# Patient Record
Sex: Male | Born: 1960 | Race: White | Hispanic: No | Marital: Single | Smoking: Current every day smoker
Health system: Southern US, Community
[De-identification: ages and names within clinical notes are randomized; demographics above are authoritative.]

## PROBLEM LIST (undated history)

## (undated) HISTORY — PX: MANDIBLE SURGERY: SHX707

## (undated) HISTORY — PX: HERNIA REPAIR: SHX51

---

## 2004-10-21 ENCOUNTER — Emergency Department: Payer: Self-pay | Admitting: Emergency Medicine

## 2006-07-26 ENCOUNTER — Emergency Department: Payer: Self-pay | Admitting: Emergency Medicine

## 2006-09-07 ENCOUNTER — Ambulatory Visit: Payer: Self-pay | Admitting: Internal Medicine

## 2006-09-07 LAB — CONVERTED CEMR LAB
ALT: 23 units/L (ref 0–53)
AST: 17 units/L (ref 0–37)
Albumin: 4 g/dL (ref 3.5–5.2)
Alkaline Phosphatase: 83 units/L (ref 39–117)
BUN: 12 mg/dL (ref 6–23)
Basophils Absolute: 0.1 10*3/uL (ref 0.0–0.1)
Basophils Relative: 0.5 % (ref 0.0–1.0)
Bilirubin Urine: NEGATIVE
Bilirubin, Direct: 0.1 mg/dL (ref 0.0–0.3)
CO2: 33 meq/L — ABNORMAL HIGH (ref 19–32)
Calcium: 10 mg/dL (ref 8.4–10.5)
Chloride: 103 meq/L (ref 96–112)
Cholesterol: 131 mg/dL (ref 0–200)
Creatinine, Ser: 0.9 mg/dL (ref 0.4–1.5)
Direct LDL: 55.3 mg/dL
Eosinophils Absolute: 0 10*3/uL (ref 0.0–0.6)
Eosinophils Relative: 0.3 % (ref 0.0–5.0)
GFR calc Af Amer: 117 mL/min
GFR calc non Af Amer: 97 mL/min
Glucose, Bld: 108 mg/dL — ABNORMAL HIGH (ref 70–99)
HCT: 41.3 % (ref 39.0–52.0)
HDL: 48.3 mg/dL (ref >39.0)
Hemoglobin, Urine: NEGATIVE
Hemoglobin: 14.7 g/dL (ref 13.0–17.0)
Ketones, ur: NEGATIVE mg/dL
Leukocytes, UA: NEGATIVE
Lymphocytes Relative: 16.9 % (ref 12.0–46.0)
MCHC: 35.6 g/dL (ref 30.0–36.0)
MCV: 92.4 fL (ref 78.0–100.0)
Monocytes Absolute: 0.6 10*3/uL (ref 0.2–0.7)
Monocytes Relative: 5.7 % (ref 3.0–11.0)
Neutro Abs: 8.7 10*3/uL — ABNORMAL HIGH (ref 1.4–7.7)
Neutrophils Relative %: 76.6 % (ref 43.0–77.0)
Nitrite: NEGATIVE
PSA: 0.58 ng/mL (ref 0.10–4.00)
Platelets: 286 10*3/uL (ref 150–400)
Potassium: 4.1 meq/L (ref 3.5–5.1)
RBC: 4.47 M/uL (ref 4.22–5.81)
RDW: 12.5 % (ref 11.5–14.6)
Sodium: 143 meq/L (ref 135–145)
Specific Gravity, Urine: 1.02 (ref 1.000–1.03)
TSH: 0.8 microintl units/mL (ref 0.35–5.50)
Total Bilirubin: 0.9 mg/dL (ref 0.3–1.2)
Total CHOL/HDL Ratio: 2.7
Total Protein: 7.2 g/dL (ref 6.0–8.3)
Triglycerides: 246 mg/dL (ref 0–149)
Urine Glucose: NEGATIVE mg/dL
Urobilinogen, UA: 1 (ref 0.0–1.0)
VLDL: 49 mg/dL — ABNORMAL HIGH (ref 0–40)
WBC: 11.3 10*3/uL — ABNORMAL HIGH (ref 4.5–10.5)
pH: 7 (ref 5.0–8.0)

## 2006-10-22 ENCOUNTER — Emergency Department: Payer: Self-pay | Admitting: Emergency Medicine

## 2006-11-07 ENCOUNTER — Telehealth (INDEPENDENT_AMBULATORY_CARE_PROVIDER_SITE_OTHER): Payer: Self-pay | Admitting: *Deleted

## 2006-11-08 DIAGNOSIS — M545 Low back pain, unspecified: Secondary | ICD-10-CM | POA: Insufficient documentation

## 2006-11-08 DIAGNOSIS — G43009 Migraine without aura, not intractable, without status migrainosus: Secondary | ICD-10-CM | POA: Insufficient documentation

## 2006-11-21 ENCOUNTER — Encounter: Payer: Self-pay | Admitting: Internal Medicine

## 2007-01-03 ENCOUNTER — Encounter: Payer: Self-pay | Admitting: Internal Medicine

## 2007-03-16 ENCOUNTER — Encounter: Payer: Self-pay | Admitting: Internal Medicine

## 2007-04-30 ENCOUNTER — Emergency Department: Payer: Self-pay | Admitting: Internal Medicine

## 2007-06-13 ENCOUNTER — Emergency Department: Payer: Self-pay | Admitting: Internal Medicine

## 2009-07-04 ENCOUNTER — Emergency Department: Payer: Self-pay | Admitting: Emergency Medicine

## 2011-03-19 ENCOUNTER — Emergency Department: Payer: Self-pay | Admitting: Emergency Medicine

## 2011-03-19 LAB — TROPONIN I: Troponin-I: <0.02 ng/mL

## 2011-03-19 LAB — COMPREHENSIVE METABOLIC PANEL
Albumin: 4 g/dL (ref 3.4–5.0)
Anion Gap: 9 (ref 7–16)
BUN: 12 mg/dL (ref 7–18)
Calcium, Total: 9.5 mg/dL (ref 8.5–10.1)
Chloride: 104 mmol/L (ref 98–107)
EGFR (African American): >60
Glucose: 112 mg/dL — ABNORMAL HIGH (ref 65–99)
Potassium: 3.9 mmol/L (ref 3.5–5.1)
SGOT(AST): 20 U/L (ref 15–37)
SGPT (ALT): 24 U/L
Total Protein: 8 g/dL (ref 6.4–8.2)

## 2011-03-19 LAB — CBC
MCV: 96 fL (ref 80–100)
Platelet: 220 10*3/uL (ref 150–440)
RDW: 11.9 % (ref 11.5–14.5)

## 2011-03-19 LAB — CK TOTAL AND CKMB (NOT AT ARMC): CK, Total: 35 U/L (ref 35–232)

## 2012-06-08 ENCOUNTER — Emergency Department: Payer: Self-pay | Admitting: Emergency Medicine

## 2012-09-28 ENCOUNTER — Emergency Department: Payer: Self-pay | Admitting: Internal Medicine

## 2014-04-09 ENCOUNTER — Emergency Department: Payer: Self-pay | Admitting: Internal Medicine

## 2015-06-18 ENCOUNTER — Emergency Department
Admission: EM | Admit: 2015-06-18 | Discharge: 2015-06-18 | Disposition: A | Discharge status: Home / Self Care | Payer: Self-pay | Attending: Emergency Medicine | Admitting: Emergency Medicine

## 2015-06-18 ENCOUNTER — Encounter: Payer: Self-pay | Admitting: Emergency Medicine

## 2015-06-18 ENCOUNTER — Emergency Department: Payer: Self-pay

## 2015-06-18 DIAGNOSIS — J40 Bronchitis, not specified as acute or chronic: Secondary | ICD-10-CM

## 2015-06-18 DIAGNOSIS — F17210 Nicotine dependence, cigarettes, uncomplicated: Secondary | ICD-10-CM | POA: Insufficient documentation

## 2015-06-18 DIAGNOSIS — J209 Acute bronchitis, unspecified: Secondary | ICD-10-CM | POA: Insufficient documentation

## 2015-06-18 MED ORDER — ALBUTEROL SULFATE HFA 108 (90 BASE) MCG/ACT IN AERS: 2.0000 | INHALATION_SPRAY | Freq: Four times a day (QID) | RESPIRATORY_TRACT | Status: DC | PRN

## 2015-06-18 MED ORDER — PSEUDOEPH-BROMPHEN-DM 30-2-10 MG/5ML PO SYRP: 5.0000 mL | ORAL_SOLUTION | Freq: Four times a day (QID) | ORAL | Status: DC | PRN

## 2015-06-18 MED ORDER — IPRATROPIUM-ALBUTEROL 0.5-2.5 (3) MG/3ML IN SOLN
3.0000 mL | Freq: Once | RESPIRATORY_TRACT | Status: AC
  Administered 2015-06-18: 3 mL via RESPIRATORY_TRACT
  Filled 2015-06-18: qty 3

## 2015-06-18 NOTE — Discharge Instructions (Signed)
Take medication as directed.

## 2015-06-18 NOTE — ED Provider Notes (Signed)
[facility] Emergency Department Provider Note   ____________________________________________  Time seen: Approximately 12:40 PM  I have reviewed the triage vital signs and the nursing notes.   HISTORY  Chief Complaint Cough and Nasal Congestion    HPI Mathew Wyatt is a 55 y.o. male patient complaining of cough, congestion, and nasal drainage one week. Patient said over-the-counter medications not improve his complaint. Patient denies any fever or chills associated this complaint. Patient stated this been frontal headache. Patient stated  postnasal drainage. Patient states cough worsened with laying down. He denies any pain with this complaint. Patient smokes 1 pack cigarettes a day. Patient denies any nausea vomiting and diarrhea.   History reviewed. No pertinent past medical history.  Patient Active Problem List   Diagnosis Date Noted  . COMMON MIGRAINE 11/08/2006  . LOW BACK PAIN 11/08/2006    History reviewed. No pertinent past surgical history.  Current Outpatient Rx  Name  Route  Sig  Dispense  Refill  . albuterol (PROVENTIL HFA;VENTOLIN HFA) 108 (90 Base) MCG/ACT inhaler   Inhalation   Inhale 2 puffs into the lungs every 6 (six) hours as needed for wheezing or shortness of breath.   1 Inhaler   2   . brompheniramine-pseudoephedrine-DM 30-2-10 MG/5ML syrup   Oral   Take 5 mLs by mouth 4 (four) times daily as needed.   120 mL   0     Allergies Review of patient's allergies indicates no known allergies.  History reviewed. No pertinent family history.  Social History Social History  Substance Use Topics  . Smoking status: Current Every Day Smoker -- 1.00 packs/day    Types: Cigarettes  . Smokeless tobacco: None  . Alcohol Use: No    Review of Systems Constitutional: No fever/chills Eyes: No visual changes. ENT: No sore throat. Cardiovascular: Denies chest pain. Respiratory: Denies shortness of breath.Nonproductive  cough Gastrointestinal: No abdominal pain.  No nausea, no vomiting.  No diarrhea.  No constipation. Genitourinary: Negative for dysuria. Musculoskeletal: Negative for back pain. Skin: Negative for rash. Neurological: Negative for headaches, focal weakness or numbness.   ____________________________________________   PHYSICAL EXAM:  VITAL SIGNS: ED Triage Vitals  Enc Vitals Group     BP 06/18/15 1221 109/69 mmHg     Pulse Rate 06/18/15 1221 69     Resp 06/18/15 1221 18     Temp 06/18/15 1221 97.8 F (36.6 C)     Temp Source 06/18/15 1221 Oral     SpO2 06/18/15 1221 96 %     Weight 06/18/15 1221 160 lb (72.576 kg)     Height 06/18/15 1221 6' (1.829 m)     Head Cir --      Peak Flow --      Pain Score --      Pain Loc --      Pain Edu? --      Excl. in GC? --     Constitutional: Alert and oriented. Well appearing and in no acute distress. Eyes: Conjunctivae are normal. PERRL. EOMI. Head: Atraumatic. Nose: No congestion/rhinnorhea. Mouth/Throat: Mucous membranes are moist.  Oropharynx non-erythematous. Neck: No stridor.  No cervical spine tenderness to palpation. Hematological/Lymphatic/Immunilogical: No cervical lymphadenopathy. Cardiovascular: Normal rate, regular rhythm. Grossly normal heart sounds.  Good peripheral circulation. Respiratory: Normal respiratory effort.  No retractions. Lungs CTAB. Gastrointestinal: Soft and nontender. No distention. No abdominal bruits. No CVA tenderness. Musculoskeletal: No lower extremity tenderness nor edema.  No joint effusions. Neurologic:  Normal speech and  language. No gross focal neurologic deficits are appreciated. No gait instability. Skin:  Skin is warm, dry and intact. No rash noted. Psychiatric: Mood and affect are normal. Speech and behavior are normal.  ____________________________________________   LABS (all labs ordered are listed, but only abnormal results are displayed)  Labs Reviewed - No data to  display ____________________________________________  EKG   ____________________________________________  RADIOLOGY  Except for hyperinflated lungs no acute findings on chest x-ray. ____________________________________________   PROCEDURES  Procedure(s) performed: None  Critical Care performed: No  ____________________________________________   INITIAL IMPRESSION / ASSESSMENT AND PLAN / ED COURSE  Pertinent labs & imaging results that were available during my care of the patient were reviewed by me and considered in my medical decision making (see chart for details).  Bronchitis. Discussed x-ray finding with patient. Discussed tobacco cessation. Patient given a prescription for Bromfed-DM and albuterol inhaler. [facility]. ____________________________________________   FINAL CLINICAL IMPRESSION(S) / ED DIAGNOSES  Final diagnoses:  Bronchitis      NEW MEDICATIONS STARTED DURING THIS VISIT:  New Prescriptions   ALBUTEROL (PROVENTIL HFA;VENTOLIN HFA) 108 (90 BASE) MCG/ACT INHALER    Inhale 2 puffs into the lungs every 6 (six) hours as needed for wheezing or shortness of breath.   BROMPHENIRAMINE-PSEUDOEPHEDRINE-DM 30-2-10 MG/5ML SYRUP    Take 5 mLs by mouth 4 (four) times daily as needed.     Note:  This document was prepared using Dragon voice recognition software and may include unintentional dictation errors.    Joni Reiningonald K Smith, PA-C 06/18/15 1339  Emily FilbertJonathan E Williams, MD 06/18/15 [phone]

## 2015-06-18 NOTE — ED Notes (Signed)
Pt complains of cough, congestion and nasal drainage for one week. Pt has tried OTC medication with no relief.

## 2016-05-04 ENCOUNTER — Emergency Department: Payer: Self-pay

## 2016-05-04 ENCOUNTER — Encounter: Payer: Self-pay | Admitting: Emergency Medicine

## 2016-05-04 ENCOUNTER — Emergency Department
Admission: EM | Admit: 2016-05-04 | Discharge: 2016-05-04 | Disposition: A | Discharge status: Home / Self Care | Payer: Self-pay | Attending: Student in an Organized Health Care Education/Training Program | Admitting: Student in an Organized Health Care Education/Training Program

## 2016-05-04 DIAGNOSIS — G44209 Tension-type headache, unspecified, not intractable: Secondary | ICD-10-CM | POA: Insufficient documentation

## 2016-05-04 DIAGNOSIS — Z79899 Other long term (current) drug therapy: Secondary | ICD-10-CM | POA: Insufficient documentation

## 2016-05-04 DIAGNOSIS — F17210 Nicotine dependence, cigarettes, uncomplicated: Secondary | ICD-10-CM | POA: Insufficient documentation

## 2016-05-04 MED ORDER — BUTALBITAL-APAP-CAFFEINE 50-325-40 MG PO TABS: 1.0000 | ORAL_TABLET | Freq: Four times a day (QID) | ORAL | 0 refills | Status: DC | PRN

## 2016-05-04 NOTE — ED Provider Notes (Signed)
[facility] Emergency Department Provider Note   ____________________________________________   None    (approximate)  I have reviewed the triage vital signs and the nursing notes.   HISTORY  Chief Complaint Headache    HPI Mathew Wyatt is a 56 y.o. male patient complain of increased right temporal headache for several weeks. Patient also states headache is worse with a.m. awakening. Patient state never wear corrective lenses. Patient described the pain as "throbbing". Patient denies vertigo, weakness or paresthesia. Patient currently rates the headache as a 2/10. No palliative measures for this complaint.   History reviewed. No pertinent past medical history.  Patient Active Problem List   Diagnosis Date Noted  . COMMON MIGRAINE 11/08/2006  . LOW BACK PAIN 11/08/2006    History reviewed. No pertinent surgical history.  Prior to Admission medications   Medication Sig Start Date End Date Taking? Authorizing Provider  albuterol (PROVENTIL HFA;VENTOLIN HFA) 108 (90 Base) MCG/ACT inhaler Inhale 2 puffs into the lungs every 6 (six) hours as needed for wheezing or shortness of breath. 06/18/15   Joni Reining, PA-C  brompheniramine-pseudoephedrine-DM 30-2-10 MG/5ML syrup Take 5 mLs by mouth 4 (four) times daily as needed. 06/18/15   Joni Reining, PA-C  butalbital-acetaminophen-caffeine (FIORICET, ESGIC) [phone] MG tablet Take 1-2 tablets by mouth every 6 (six) hours as needed for headache. 05/04/16 05/04/17  Joni Reining, PA-C    Allergies Patient has no known allergies.  History reviewed. No pertinent family history.  Social History Social History  Substance Use Topics  . Smoking status: Current Every Day Smoker    Packs/day: 1.00    Types: Cigarettes  . Smokeless tobacco: Never Used  . Alcohol use No    Review of Systems Constitutional: No fever/chills Eyes: No visual changes. ENT: No sore throat. Cardiovascular: Denies chest  pain. Respiratory: Denies shortness of breath. Gastrointestinal: No abdominal pain.  No nausea, no vomiting.  No diarrhea.  No constipation. Genitourinary: Negative for dysuria. Musculoskeletal: Chronic back pain Skin: Negative for rash. Neurological: Positive for headaches, but denies focal weakness or numbness.   ____________________________________________   PHYSICAL EXAM:  VITAL SIGNS: ED Triage Vitals [05/04/16 1037]  Enc Vitals Group     BP (!) 150/90     Pulse Rate 72     Resp 16     Temp 98 F (36.7 C)     Temp Source Oral     SpO2 100 %     Weight 160 lb (72.6 kg)     Height      Head Circumference      Peak Flow      Pain Score 2     Pain Loc      Pain Edu?      Excl. in GC?     Constitutional: Alert and oriented. Well appearing and in no acute distress. Eyes: Conjunctivae are normal. PERRL. EOMI. Head: Atraumatic. Nose: No congestion/rhinnorhea. Mouth/Throat: Mucous membranes are moist.  Oropharynx non-erythematous. Neck: No stridor.  No cervical spine tenderness to palpation. Hematological/Lymphatic/Immunilogical: No cervical lymphadenopathy. Cardiovascular: Normal rate, regular rhythm. Grossly normal heart sounds.  Good peripheral circulation. Respiratory: Normal respiratory effort.  No retractions. Lungs CTAB. Gastrointestinal: Soft and nontender. No distention. No abdominal bruits. No CVA tenderness. Musculoskeletal: No lower extremity tenderness nor edema.  No joint effusions. Neurologic:  Normal speech and language. No gross focal neurologic deficits are appreciated. No gait instability. Skin:  Skin is warm, dry and intact. No rash noted. Psychiatric: Mood and  affect are normal. Speech and behavior are normal.  ____________________________________________   LABS (all labs ordered are listed, but only abnormal results are displayed)  Labs Reviewed - No data to  display ____________________________________________  EKG   ____________________________________________  RADIOLOGY  No acute findings CT of the head. ____________________________________________   PROCEDURES  Procedure(s) performed: None  Procedures  Critical Care performed: No  ____________________________________________   INITIAL IMPRESSION / ASSESSMENT AND PLAN / ED COURSE  Pertinent labs & imaging results that were available during my care of the patient were reviewed by me and considered in my medical decision making (see chart for details).  Patient complaining is consistent with a tension headache. CT scan pending.      ____________________________________________   FINAL CLINICAL IMPRESSION(S) / ED DIAGNOSES  Final diagnoses:  Tension headache  Discussed negative findings on CT scan of the head. Patient given discharge care instructions. Patient given a prescription for Esgic and [facility].    NEW MEDICATIONS STARTED DURING THIS VISIT:  New Prescriptions   BUTALBITAL-ACETAMINOPHEN-CAFFEINE (FIORICET, ESGIC) 50-325-40 MG TABLET    Take 1-2 tablets by mouth every 6 (six) hours as needed for headache.     Note:  This document was prepared using Dragon voice recognition software and may include unintentional dictation errors.    Joni Reining, PA-C 05/04/16 1235    Willy Eddy, MD 05/04/16 1322

## 2016-05-04 NOTE — ED Notes (Signed)
Patient reports daily headache for the past 4-6 months.  Patient states headache is specifically to his right temple and often occurs multiple times per day.  Patient states he does not wear corrective lenses but his vision is more blurry than it "was when I was younger."  Patient states, "I often wake up with this headache."

## 2016-05-04 NOTE — ED Triage Notes (Signed)
Pt to ed with c/o headaches everyday for several weeks behind right eye.  Pt states he does not have a PMD and wants a referral for the headaches. Pt rates pain 2/10 at this time.

## 2016-08-26 ENCOUNTER — Emergency Department: Payer: Self-pay

## 2016-08-26 ENCOUNTER — Encounter: Payer: Self-pay | Admitting: Medical Oncology

## 2016-08-26 ENCOUNTER — Emergency Department
Admission: EM | Admit: 2016-08-26 | Discharge: 2016-08-26 | Disposition: A | Discharge status: Home / Self Care | Payer: Self-pay | Attending: Emergency Medicine | Admitting: Emergency Medicine

## 2016-08-26 DIAGNOSIS — S9031XA Contusion of right foot, initial encounter: Secondary | ICD-10-CM | POA: Insufficient documentation

## 2016-08-26 DIAGNOSIS — Y9261 Building [any] under construction as the place of occurrence of the external cause: Secondary | ICD-10-CM | POA: Insufficient documentation

## 2016-08-26 DIAGNOSIS — S93509A Unspecified sprain of unspecified toe(s), initial encounter: Secondary | ICD-10-CM | POA: Insufficient documentation

## 2016-08-26 DIAGNOSIS — F17210 Nicotine dependence, cigarettes, uncomplicated: Secondary | ICD-10-CM | POA: Insufficient documentation

## 2016-08-26 DIAGNOSIS — X501XXA Overexertion from prolonged static or awkward postures, initial encounter: Secondary | ICD-10-CM | POA: Insufficient documentation

## 2016-08-26 DIAGNOSIS — Y990 Civilian activity done for income or pay: Secondary | ICD-10-CM | POA: Insufficient documentation

## 2016-08-26 DIAGNOSIS — Y939 Activity, unspecified: Secondary | ICD-10-CM | POA: Insufficient documentation

## 2016-08-26 MED ORDER — MELOXICAM 15 MG PO TABS: 15.0000 mg | ORAL_TABLET | Freq: Every day | ORAL | 0 refills | Status: DC

## 2016-08-26 NOTE — ED Provider Notes (Signed)
[facility] Emergency Department Provider Note  ____________________________________________  Time seen: Approximately 5:10 PM  I have reviewed the triage vital signs and the nursing notes.   HISTORY  Chief Complaint Foot Pain    HPI Clancy Mullarkey is a 56 y.o. male who presents emergency department complaining of right foot pain. Patient reports that he is having pain to the MTP joint, plantar aspect of the first digit right foot. Patient reports that he was attempting distention on a wall at work when one of the cinder blocks with sloughs causing him to fall landing directly on his foot. Somewhat landed on the top of his foot as well. Patient reports he is not having pain with cinderblock later and was reporting pain to the MTP joint, first digit, plantar aspect. Patient has not tried any medications prior to arrival. No other injury or complaint.   History reviewed. No pertinent past medical history.  Patient Active Problem List   Diagnosis Date Noted  . COMMON MIGRAINE 11/08/2006  . LOW BACK PAIN 11/08/2006    History reviewed. No pertinent surgical history.  Prior to Admission medications   Medication Sig Start Date End Date Taking? Authorizing Provider  albuterol (PROVENTIL HFA;VENTOLIN HFA) 108 (90 Base) MCG/ACT inhaler Inhale 2 puffs into the lungs every 6 (six) hours as needed for wheezing or shortness of breath. 06/18/15   Joni Reining, PA-C  brompheniramine-pseudoephedrine-DM 30-2-10 MG/5ML syrup Take 5 mLs by mouth 4 (four) times daily as needed. 06/18/15   Joni Reining, PA-C  butalbital-acetaminophen-caffeine Glad[place], ESGIC) [id number] MG tablet Take 1-2 tablets by mouth every 6 (six) hours as needed for headache. 05/04/16 05/04/17  Joni Reining, PA-C  meloxicam (MOBIC) 15 MG tablet Take 1 tablet (15 mg total) by mouth daily. 08/26/16   Cuthriell, Delorise Royals, PA-C    Allergies Patient has no known allergies.  No family history on  file.  Social History Social History  Substance Use Topics  . Smoking status: Current Every Day Smoker    Packs/day: 1.00    Types: Cigarettes  . Smokeless tobacco: Never Used  . Alcohol use No     Review of Systems  Constitutional: No fever/chills Eyes: No visual changes. Cardiovascular: no chest pain. Respiratory: no cough. No SOB. Gastrointestinal: No abdominal pain.  No nausea, no vomiting.   Musculoskeletal: positive for right foot pain Skin: Negative for rash, abrasions, lacerations, ecchymosis. Neurological: Negative for headaches, focal weakness or numbness. 10-point ROS otherwise negative.  ____________________________________________   PHYSICAL EXAM:  VITAL SIGNS: ED Triage Vitals  Enc Vitals Group     BP 08/26/16 1625 110/76     Pulse Rate 08/26/16 1625 82     Resp 08/26/16 1625 18     Temp 08/26/16 1625 98.1 F (36.7 C)     Temp Source 08/26/16 1625 Oral     SpO2 08/26/16 1625 97 %     Weight 08/26/16 1621 160 lb (72.6 kg)     Height --      Head Circumference --      Peak Flow --      Pain Score 08/26/16 1621 5     Pain Loc --      Pain Edu? --      Excl. in GC? --      Constitutional: Alert and oriented. Well appearing and in no acute distress. Eyes: Conjunctivae are normal. PERRL. EOMI. Head: Atraumatic. Neck: No stridor.    Cardiovascular: Normal rate, regular rhythm. Normal S1  and S2.  Good peripheral circulation. Respiratory: Normal respiratory effort without tachypnea or retractions. Lungs CTAB. Good air entry to the bases with no decreased or absent breath sounds. Musculoskeletal: Full range of motion to all extremities. No gross deformities appreciated.no deformities to right foot upon inspection. No gross edema. No lesions. Patient is very tender to palpation over the plantar aspect of the MTP joint first digit. No palpable abnormality. Cap refill intact all digits right foot. Sensation intact all 5 digits right foot. Neurologic:  Normal  speech and language. No gross focal neurologic deficits are appreciated.  Skin:  Skin is warm, dry and intact. No rash noted. Psychiatric: Mood and affect are normal. Speech and behavior are normal. Patient exhibits appropriate insight and judgement.   ____________________________________________   LABS (all labs ordered are listed, but only abnormal results are displayed)  Labs Reviewed - No data to display ____________________________________________  EKG   ____________________________________________  RADIOLOGY Festus BarrenI, Jonathan D Cuthriell, personally viewed and evaluated these images (plain radiographs) as part of my medical decision making, as well as reviewing the written report by the radiologist.  Dg Foot Complete Right  Result Date: 08/26/2016 CLINICAL DATA:  Cement block fell on right foot 1 week ago. EXAM: RIGHT FOOT COMPLETE - 3+ VIEW COMPARISON:  None. FINDINGS: There is no evidence of fracture or dislocation. There is no evidence of arthropathy or other focal bone abnormality. Soft tissues are unremarkable. IMPRESSION: Negative. Electronically Signed   By: Elberta Fortisaniel  Boyle M.D.   On: 08/26/2016 16:49    ____________________________________________    PROCEDURES  Procedure(s) performed:    Procedures    Medications - No data to display   ____________________________________________   INITIAL IMPRESSION / ASSESSMENT AND PLAN / ED COURSE  Pertinent labs & imaging results that were available during my care of the patient were reviewed by me and considered in my medical decision making (see chart for details).  Review of the [place] CSRS was performed in accordance of the [facility] prior to dispensing any controlled drugs.     Patient's diagnosis is consistent with right foot contusion and toe sprain. X-ray reveals no acute osseous abnormality. Exam is reassuring.. Patient will be discharged home with prescriptions for meloxicam for symptom control. Patient is to follow up  with orthopedics as needed or otherwise directed. Patient is given ED precautions to return to the ED for any worsening or new symptoms.     ____________________________________________  FINAL CLINICAL IMPRESSION(S) / ED DIAGNOSES  Final diagnoses:  Contusion of right foot, initial encounter  Sprain of toe, initial encounter      NEW MEDICATIONS STARTED DURING THIS VISIT:  New Prescriptions   MELOXICAM (MOBIC) 15 MG TABLET    Take 1 tablet (15 mg total) by mouth daily.        This chart was dictated using voice recognition software/Dragon. Despite best efforts to proofread, errors can occur which can change the meaning. Any change was purely unintentional.    Racheal PatchesCuthriell, Jonathan D, PA-C 08/26/16 1727    Emily FilbertWilliams, Jonathan E, MD 08/26/16 [phone]

## 2016-08-26 NOTE — ED Triage Notes (Signed)
Pt reports he had a cinder block fall onto his rt foot Thursday and has been having pain to the bottom of his foot since Saturday.

## 2017-03-03 ENCOUNTER — Other Ambulatory Visit: Payer: Self-pay

## 2017-03-03 ENCOUNTER — Emergency Department: Payer: Self-pay

## 2017-03-03 ENCOUNTER — Encounter: Payer: Self-pay | Admitting: Emergency Medicine

## 2017-03-03 ENCOUNTER — Emergency Department
Admission: EM | Admit: 2017-03-03 | Discharge: 2017-03-03 | Disposition: A | Discharge status: Home / Self Care | Payer: Self-pay | Attending: Student in an Organized Health Care Education/Training Program | Admitting: Student in an Organized Health Care Education/Training Program

## 2017-03-03 DIAGNOSIS — F17210 Nicotine dependence, cigarettes, uncomplicated: Secondary | ICD-10-CM | POA: Insufficient documentation

## 2017-03-03 DIAGNOSIS — M79671 Pain in right foot: Secondary | ICD-10-CM | POA: Insufficient documentation

## 2017-03-03 DIAGNOSIS — G8929 Other chronic pain: Secondary | ICD-10-CM

## 2017-03-03 DIAGNOSIS — M722 Plantar fascial fibromatosis: Secondary | ICD-10-CM | POA: Insufficient documentation

## 2017-03-03 MED ORDER — MELOXICAM 15 MG PO TABS: 15.0000 mg | ORAL_TABLET | Freq: Every day | ORAL | 0 refills | Status: DC

## 2017-03-03 NOTE — ED Provider Notes (Signed)
[facility] Emergency Department Provider Note  ____________________________________________   First MD Initiated Contact with Patient 03/03/17 1024     (approximate)  I have reviewed the triage vital signs and the nursing notes.   HISTORY  Chief Complaint Foot Pain   HPI Mathew Wyatt is a 57 y.o. male is here with complaint of right foot pain.  Patient states that just below his ankle he has had pain for 1 month without a history of injury.  He also states he was seen last year for pain in the ball of his foot which has not improved.  He states that he was seen at that time and x-rays did not show any fractures.  He has not taken any over-the-counter medication and he did not follow-up with the podiatrist.  He states the pain is worse after walking which involves most of his job.  There is been no recent injury.   History reviewed. No pertinent past medical history.  Patient Active Problem List   Diagnosis Date Noted  . COMMON MIGRAINE 11/08/2006  . LOW BACK PAIN 11/08/2006    History reviewed. No pertinent surgical history.  Prior to Admission medications   Medication Sig Start Date End Date Taking? Authorizing Provider  albuterol (PROVENTIL HFA;VENTOLIN HFA) 108 (90 Base) MCG/ACT inhaler Inhale 2 puffs into the lungs every 6 (six) hours as needed for wheezing or shortness of breath. 06/18/15   Joni ReiningSmith, Ronald K, PA-C  meloxicam (MOBIC) 15 MG tablet Take 1 tablet (15 mg total) by mouth daily. 03/03/17   Tommi RumpsSummers, Rhonda L, PA-C    Allergies Patient has no known allergies.  No family history on file.  Social History Social History   Tobacco Use  . Smoking status: Current Every Day Smoker    Packs/day: 1.00    Types: Cigarettes  . Smokeless tobacco: Never Used  Substance Use Topics  . Alcohol use: No  . Drug use: No    Review of Systems Constitutional: No fever/chills Cardiovascular: Denies chest pain. Respiratory: Denies shortness of  breath. Gastrointestinal: No abdominal pain.  No nausea, no vomiting.  Musculoskeletal: Positive for right foot pain.  Acute and chronic. Skin: Negative for rash. Neurological: Negative for  focal weakness or numbness.  ____________________________________________   PHYSICAL EXAM:  VITAL SIGNS: ED Triage Vitals [03/03/17 1014]  Enc Vitals Group     BP      Pulse      Resp      Temp      Temp src      SpO2      Weight 165 lb (74.8 kg)     Height 6' (1.829 m)     Head Circumference      Peak Flow      Pain Score      Pain Loc      Pain Edu?      Excl. in GC?    Constitutional: Alert and oriented. Well appearing and in no acute distress. Eyes: Conjunctivae are normal.  Head: Atraumatic. Neck: No stridor.   Cardiovascular: Normal rate, regular rhythm. Grossly normal heart sounds.  Good peripheral circulation. Respiratory: Normal respiratory effort.  No retractions. Lungs CTAB. Musculoskeletal: Examination of the right foot there is no gross deformity noted.  There is some minimal tenderness on palpation of the first and second distal metatarsal.  No soft tissue swelling, erythema or deformities noted.  Digits distally moves within normal limits.  Skin is intact.  There is some tenderness on  palpation of the area just distal to the lateral malleolus.  No soft tissue swelling is noted.  Range of motion is without restriction. Neurologic:  Normal speech and language. No gross focal neurologic deficits are appreciated. No gait instability. Skin:  Skin is warm, dry and intact.  No discoloration. Psychiatric: Mood and affect are normal. Speech and behavior are normal.  ____________________________________________   LABS (all labs ordered are listed, but only abnormal results are displayed)  Labs Reviewed - No data to display  RADIOLOGY  ED MD interpretation:   No acute fracture noted on right foot x-ray.  Official radiology report(s): Dg Foot Complete Right  Result Date:  03/03/2017 CLINICAL DATA:  Right foot and ankle pain especially associated with the great toe. The patient walks a lot at work and has pain with that activity. EXAM: RIGHT FOOT COMPLETE - 3+ VIEW COMPARISON:  Right foot series of August 26, 2016 FINDINGS: The bones are subjectively adequately mineralized. The joint spaces are well maintained. There is no acute or healing fracture. There is no lytic or blastic lesion. No proliferative bony changes are observed. The soft tissues exhibit no acute abnormalities. There are bipartite sesamoid bones at the first MTP joint which appears stable. IMPRESSION: There is no acute bony abnormality of the right foot. There are stable bipartite medial and lateral sesamoid bones at the first MTP joint. These are likely developmental but may be post traumatic. Electronically Signed   By: David  [place] M.D.   On: 03/03/2017 10:56    ____________________________________________   PROCEDURES  Procedure(s) performed: None  Procedures  Critical Care performed: No  ____________________________________________   INITIAL IMPRESSION / ASSESSMENT AND PLAN / ED COURSE  Patient was made aware that x-ray did not show any bony abnormality that would cause the difficulty that he is having.  Most likely this is muscle skeletal.  patient was given prescription for meloxicam 15 mg 1 daily with food.  He is to call make an appointment with Dr. Alberteen Spindle,  the podiatrist that is on call today to be seen in the office.    ____________________________________________   FINAL CLINICAL IMPRESSION(S) / ED DIAGNOSES  Final diagnoses:  Chronic foot pain, right  Plantar fasciitis of right foot     ED Discharge Orders        Ordered    meloxicam (MOBIC) 15 MG tablet  Daily     03/03/17 1127       Note:  This document was prepared using Dragon voice recognition software and may include unintentional dictation errors.    Tommi Rumps, PA-C 03/03/17 1213    Willy Eddy, MD 03/03/17 1345

## 2017-03-03 NOTE — Discharge Instructions (Signed)
Need to follow-up with Dr. Alberteen Spindleline who is on-call for podiatry at [facility].  Begin taking meloxicam 15 mg once a day every day with food.  Apply ice to your foot at night as needed for discomfort.

## 2017-03-03 NOTE — ED Triage Notes (Signed)
Presents with right foot pain  Unsure of injury states pain is mainly at ankle area

## 2017-12-29 ENCOUNTER — Other Ambulatory Visit: Payer: Self-pay

## 2017-12-29 ENCOUNTER — Emergency Department: Payer: Self-pay

## 2017-12-29 ENCOUNTER — Emergency Department
Admission: EM | Admit: 2017-12-29 | Discharge: 2017-12-29 | Disposition: A | Discharge status: Home / Self Care | Payer: Self-pay | Attending: Emergency Medicine | Admitting: Emergency Medicine

## 2017-12-29 DIAGNOSIS — J441 Chronic obstructive pulmonary disease with (acute) exacerbation: Secondary | ICD-10-CM | POA: Insufficient documentation

## 2017-12-29 DIAGNOSIS — F17210 Nicotine dependence, cigarettes, uncomplicated: Secondary | ICD-10-CM | POA: Insufficient documentation

## 2017-12-29 DIAGNOSIS — J069 Acute upper respiratory infection, unspecified: Secondary | ICD-10-CM | POA: Insufficient documentation

## 2017-12-29 MED ORDER — BENZONATATE 100 MG PO CAPS: ORAL_CAPSULE | ORAL | 0 refills | Status: DC

## 2017-12-29 MED ORDER — PREDNISONE 10 MG PO TABS: ORAL_TABLET | ORAL | 0 refills | Status: DC

## 2017-12-29 MED ORDER — ALBUTEROL SULFATE HFA 108 (90 BASE) MCG/ACT IN AERS: 2.0000 | INHALATION_SPRAY | Freq: Four times a day (QID) | RESPIRATORY_TRACT | 2 refills | Status: DC | PRN

## 2017-12-29 MED ORDER — IPRATROPIUM-ALBUTEROL 0.5-2.5 (3) MG/3ML IN SOLN
3.0000 mL | Freq: Once | RESPIRATORY_TRACT | Status: AC
  Administered 2017-12-29: 3 mL via RESPIRATORY_TRACT
  Filled 2017-12-29: qty 3

## 2017-12-29 NOTE — Discharge Instructions (Signed)
Follow-up with 1 the clinics listed on your discharge papers.  The [facility]door clinic is free.  You will need to call and find out what papers need to be filled out.  The other 2 clinics are based on your income.  Discontinue or decrease smoking.  Increase fluids.  Albuterol inhaler 2 puffs every 6 hours as needed for wheezing or shortness of breath.  Also begin on prednisone for the next 5 days and Tessalon as needed for coughing.

## 2017-12-29 NOTE — ED Notes (Signed)
FIRST NURSE NOTE:  Pt c/o cough and congestion, states he woke up around 0500 this morning with reports of worse cough and states the sputum was blood-tinged.

## 2017-12-29 NOTE — ED Notes (Signed)
See triage note  Presents with cough  States he developed cold sx's about 1 week ago  conts to have  Cough  No fever

## 2017-12-29 NOTE — ED Provider Notes (Signed)
[facility] Emergency Department Provider Note   ____________________________________________   None    (approximate)  I have reviewed the triage vital signs and the nursing notes.   HISTORY  Chief Complaint Cough   HPI Mathew Wyatt is a 57 y.o. male presents to the ED with history of upper respiratory infection for 1 week.  Patient states that at the beginning of the week he felt that he was feverish.  He reports congestion and cough but was able to sleep through the night without any disturbance.  Patient denies any previous respiratory issues.  Patient is a smoker and has been smoking for approximately 47 years.  He states that this morning he woke up at 5 AM and begin coughing and saw blood which has him concerned.  He also is having some shortness of breath.  He denies any previous asthma, bronchitis or pneumonia.  History reviewed. No pertinent past medical history.  Patient Active Problem List   Diagnosis Date Noted  . COMMON MIGRAINE 11/08/2006  . LOW BACK PAIN 11/08/2006    Past Surgical History:  Procedure Laterality Date  . HERNIA REPAIR    . MANDIBLE SURGERY      Prior to Admission medications   Medication Sig Start Date End Date Taking? Authorizing Provider  albuterol (PROVENTIL HFA;VENTOLIN HFA) 108 (90 Base) MCG/ACT inhaler Inhale 2 puffs into the lungs every 6 (six) hours as needed for wheezing or shortness of breath. 12/29/17   Tommi RumpsSummers, Myleka Moncure L, PA-C  benzonatate (TESSALON PERLES) 100 MG capsule Take 1 or 2 every 8 hours as needed for cough 12/29/17   Tommi RumpsSummers, Jameis Newsham L, PA-C  predniSONE (DELTASONE) 10 MG tablet Take 3 tablets once a day for the next 5 days 12/29/17   Tommi RumpsSummers, Kyesha Balla L, PA-C    Allergies Patient has no known allergies.  History reviewed. No pertinent family history.  Social History Social History   Tobacco Use  . Smoking status: Current Every Day Smoker    Packs/day: 1.00    Types: Cigarettes  . Smokeless  tobacco: Never Used  Substance Use Topics  . Alcohol use: No  . Drug use: No    Review of Systems Constitutional: Subjective fever/chills Eyes: No visual changes. ENT: No sore throat. Cardiovascular: Denies chest pain. Respiratory: Denies shortness of breath.  Positive productive cough.  Positive hemoptysis Gastrointestinal: No abdominal pain.  No nausea, no vomiting.   Musculoskeletal: Negative for body aches. Skin: Negative for rash. Neurological: Negative for headaches, focal weakness or numbness. ___________________________________________   PHYSICAL EXAM:  VITAL SIGNS: ED Triage Vitals [12/29/17 1255]  Enc Vitals Group     BP 127/75     Pulse Rate 87     Resp 18     Temp 98.5 F (36.9 C)     Temp Source Oral     SpO2 96 %     Weight 165 lb (74.8 kg)     Height 6' (1.829 m)     Head Circumference      Peak Flow      Pain Score 0     Pain Loc      Pain Edu?      Excl. in GC?    Constitutional: Alert and oriented. Well appearing and in no acute distress. Eyes: Conjunctivae are normal.  Head: Atraumatic. Nose: No congestion/rhinnorhea. Mouth/Throat: Mucous membranes are moist.  Oropharynx non-erythematous. Neck: No stridor.   Hematological/Lymphatic/Immunilogical: No cervical lymphadenopathy. Cardiovascular: Normal rate, regular rhythm. Grossly normal heart sounds.  Good peripheral circulation. Respiratory: Normal respiratory effort.  No retractions. Lungs decreased lung sounds with deep inspiration.  Occasional expiratory wheeze heard throughout.  Patient is able to talk in complete sentences without any difficulties. Gastrointestinal: Soft and nontender. No distention.  Musculoskeletal: Moves upper and lower extremities without any difficulty.  Normal gait was noted. Neurologic:  Normal speech and language. No gross focal neurologic deficits are appreciated. No gait instability. Skin:  Skin is warm, dry and intact. No rash noted. Psychiatric: Mood and affect  are normal. Speech and behavior are normal.  ____________________________________________   LABS (all labs ordered are listed, but only abnormal results are displayed)  Labs Reviewed - No data to display RADIOLOGY   Official radiology report(s): Dg Chest 2 View  Result Date: 12/29/2017 CLINICAL DATA:  Cough and congestion for 3 days. Fever. Tobacco use. EXAM: CHEST - 2 VIEW COMPARISON:  06/18/2015 FINDINGS: Tapering of the peripheral pulmonary vasculature favors emphysema. Cardiac and mediastinal margins appear normal. No airspace opacity to suggest pneumonia. No pleural effusion. IMPRESSION: Emphysema. Otherwise, no significant abnormalities are observed. Electronically Signed   By: Gaylyn Rong M.D.   On: 12/29/2017 13:57    ____________________________________________   PROCEDURES  Procedure(s) performed: None  Procedures  Critical Care performed: No  ____________________________________________   INITIAL IMPRESSION / ASSESSMENT AND PLAN / ED COURSE  As part of my medical decision making, I reviewed the following data within the electronic MEDICAL RECORD NUMBER Notes from prior ED visits and [place] Controlled Substance Database  Patient presents to the ED with complaint of upper respiratory symptoms for 1 week.  He states that he was coughing this morning and saw some flecks of blood and became concerned.  Patient has been smoking for approximately 47 years.  He complains of subjective fevers.  He is occasionally been short of breath especially with his cough and cough is worse first thing in the mornings.  Patient was given a DuoNeb treatment with improvement.  Chest x-ray reported as COPD.  Patient was made aware.  He was encouraged to discontinue smoking.  He was discharged with a prescription for albuterol inhaler, prednisone, and Tessalon Perles.  He is encouraged to follow-up with 1 of the clinics listed on his discharge  papers.   ____________________________________________   FINAL CLINICAL IMPRESSION(S) / ED DIAGNOSES  Final diagnoses:  Chronic obstructive pulmonary disease with acute exacerbation (HCC)  Acute upper respiratory infection  Cigarette smoker     ED Discharge Orders         Ordered    albuterol (PROVENTIL HFA;VENTOLIN HFA) 108 (90 Base) MCG/ACT inhaler  Every 6 hours PRN     12/29/17 1426    predniSONE (DELTASONE) 10 MG tablet     12/29/17 1426    benzonatate (TESSALON PERLES) 100 MG capsule     12/29/17 1426           Note:  This document was prepared using Dragon voice recognition software and may include unintentional dictation errors.    Tommi Rumps, PA-C 12/29/17 1600    Schaevitz, Myra Rude, MD 12/30/17 [phone]

## 2017-12-29 NOTE — ED Triage Notes (Signed)
A&O, ambulatory, no distress noted. Speaking in complete sentences.   Cold since last week. States congestion and cough.

## 2020-02-17 IMAGING — CR DG CHEST 2V
1 series · 2 of 2 positions shown · non-contrast
Comparison: 06/18/2015

CLINICAL DATA: Cough and congestion for 3 days. Fever. Tobacco use.

EXAM:
CHEST - 2 VIEW

[Series 1: dg chest 2 view · 0.14mm/px · 2 of 2 slices shown]
[im 1/2]
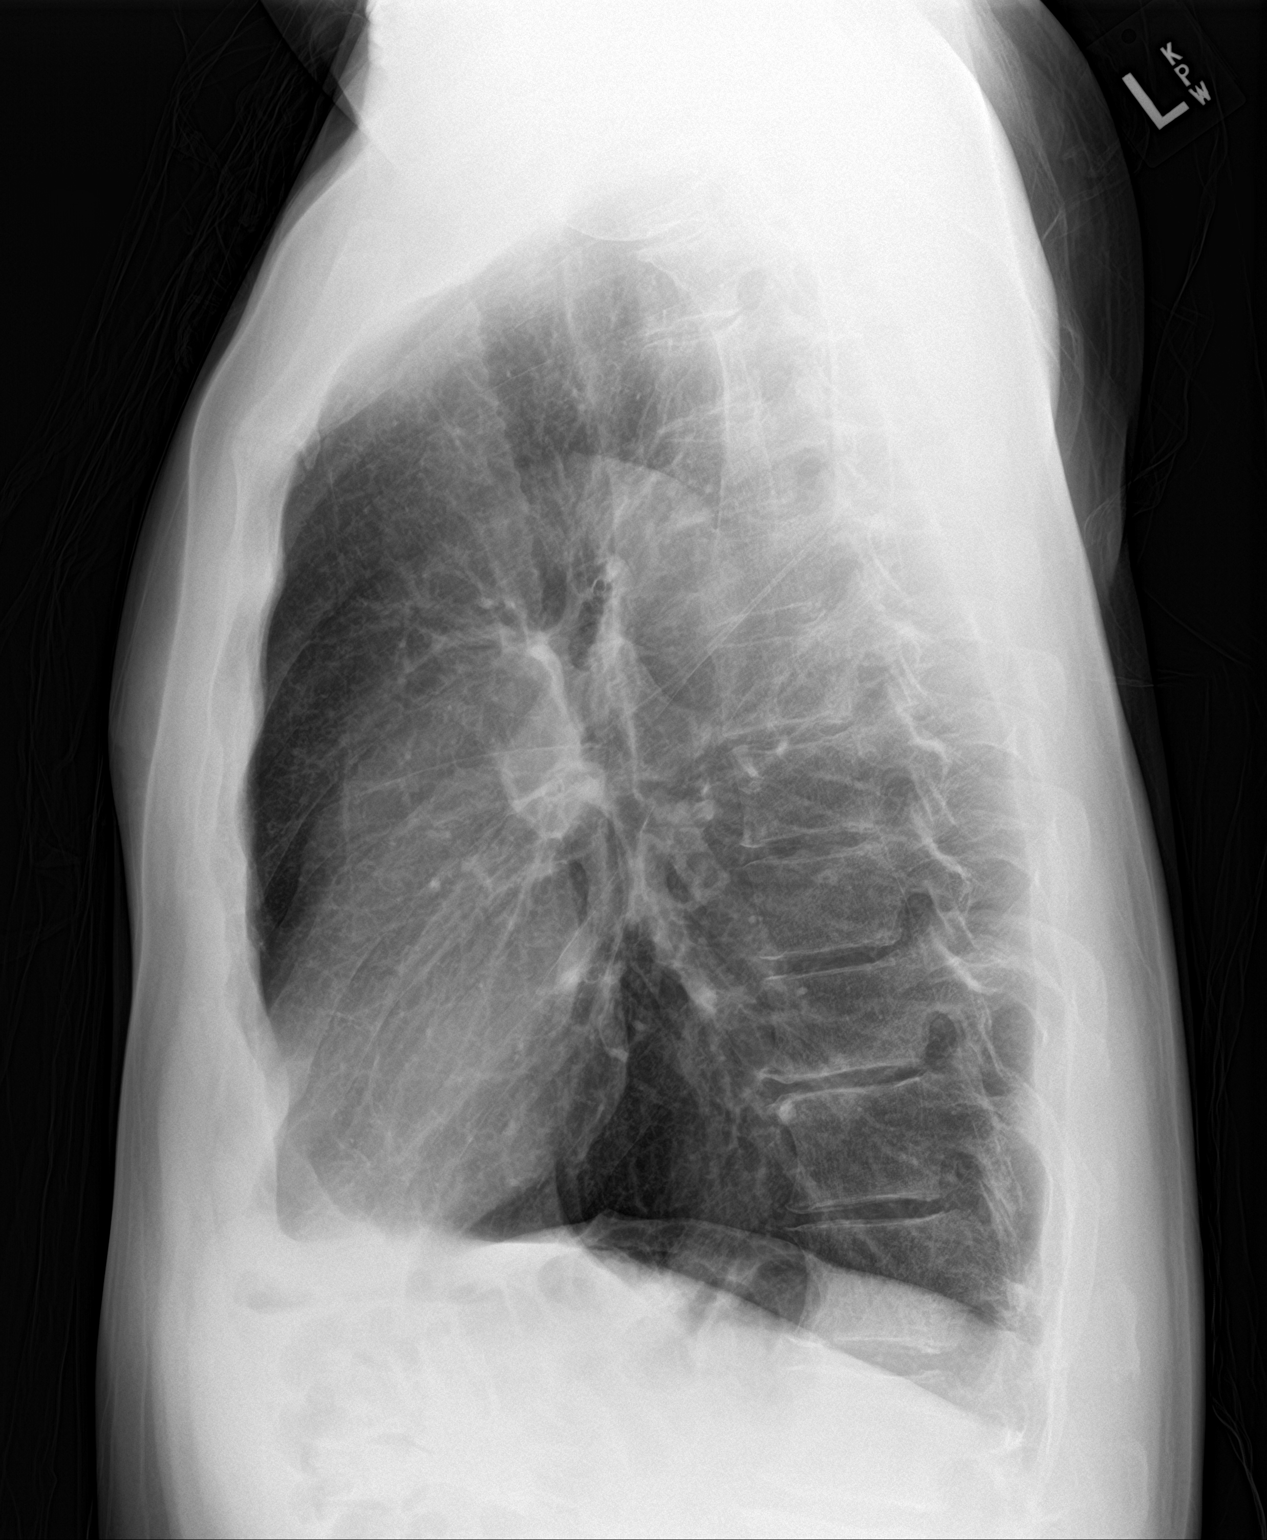
[im 2/2]
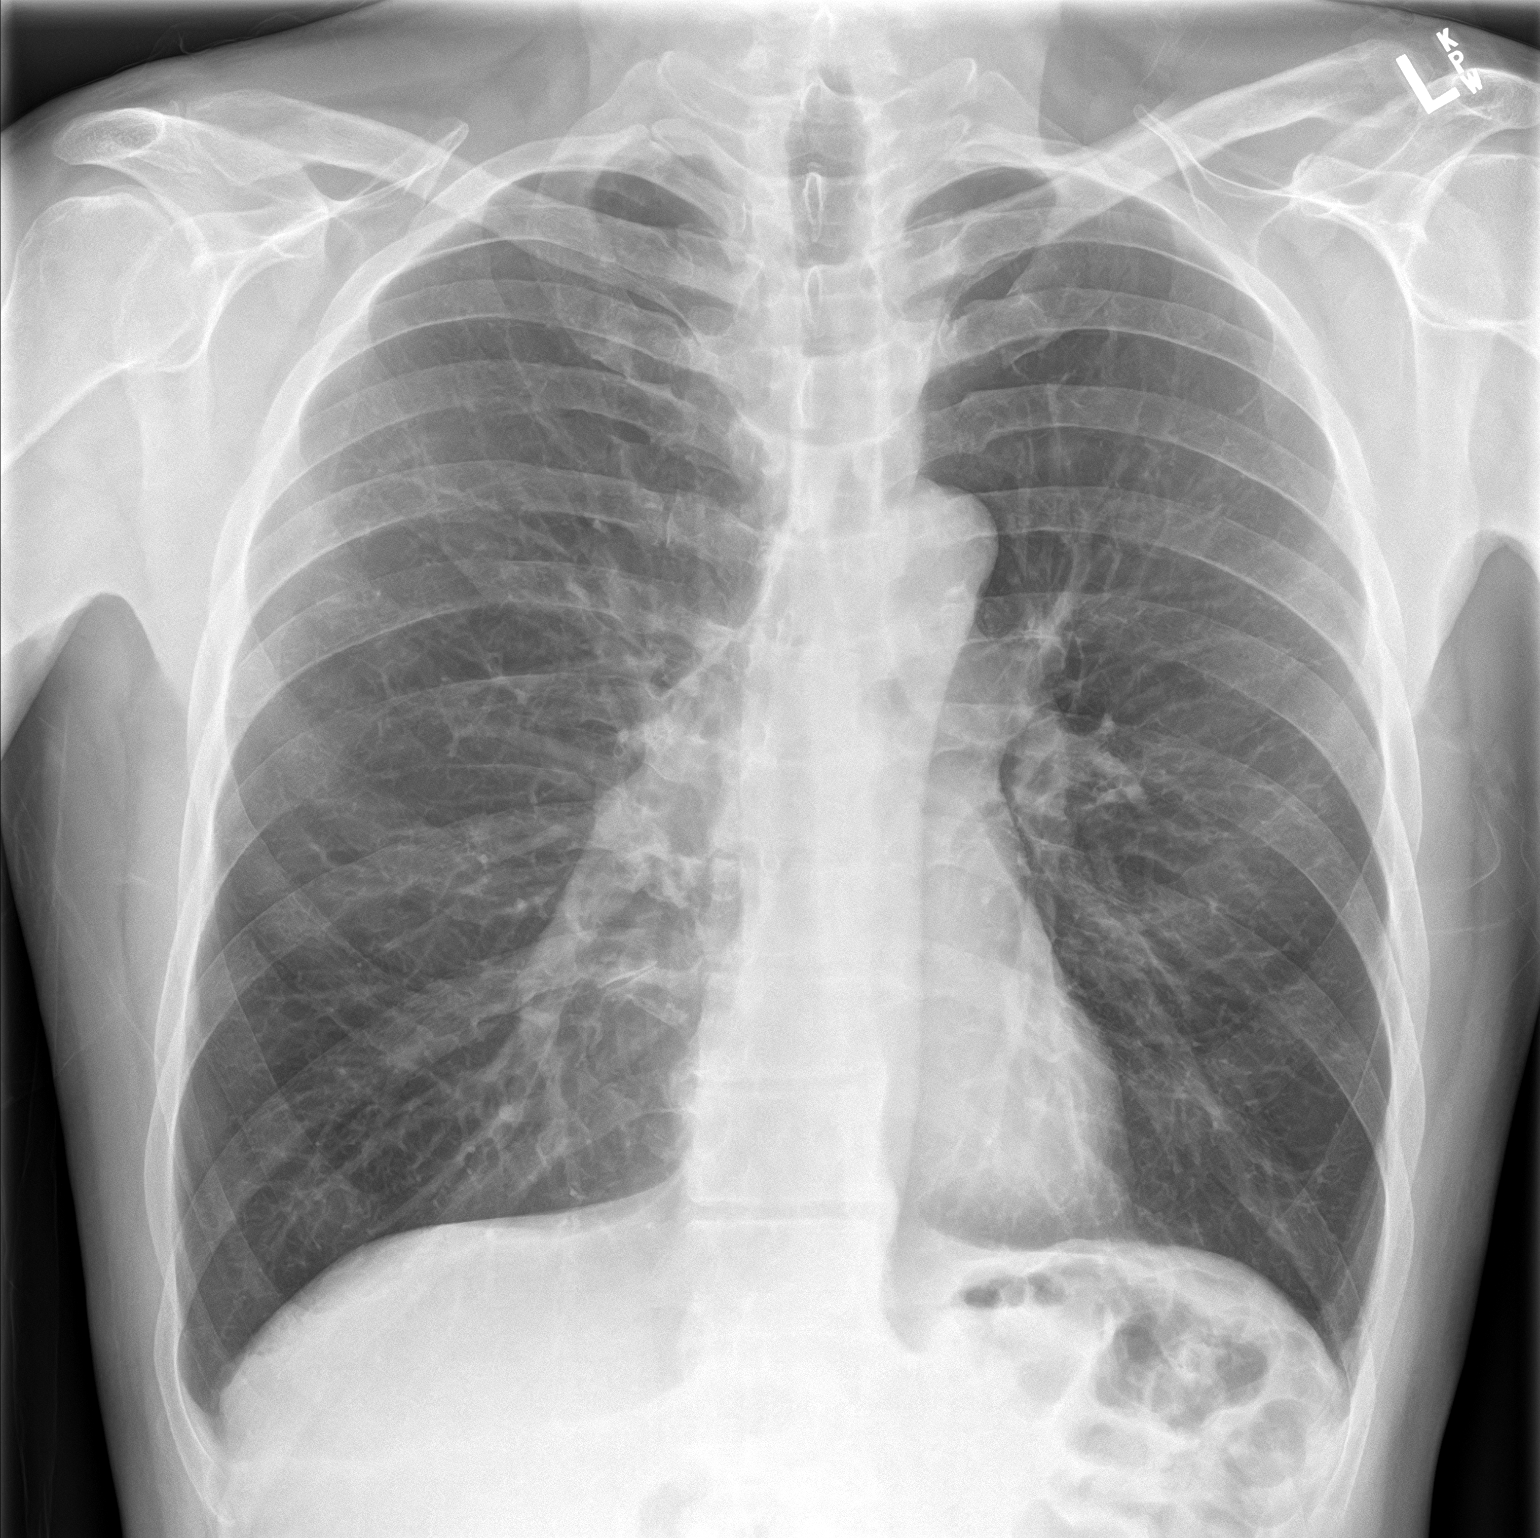

[2 of 2 positions shown; findings below may reference images not displayed]

FINDINGS: Tapering of the peripheral pulmonary vasculature favors emphysema.
Cardiac and mediastinal margins appear normal. No airspace opacity
to suggest pneumonia. No pleural effusion.
IMPRESSION: Emphysema. Otherwise, no significant abnormalities are observed.

## 2021-06-09 ENCOUNTER — Emergency Department
Admission: EM | Admit: 2021-06-09 | Discharge: 2021-06-09 | Disposition: A | Discharge status: Home / Self Care | Payer: Self-pay | Attending: Student in an Organized Health Care Education/Training Program | Admitting: Student in an Organized Health Care Education/Training Program

## 2021-06-09 ENCOUNTER — Other Ambulatory Visit: Payer: Self-pay

## 2021-06-09 ENCOUNTER — Emergency Department: Payer: Self-pay

## 2021-06-09 DIAGNOSIS — Z87891 Personal history of nicotine dependence: Secondary | ICD-10-CM | POA: Insufficient documentation

## 2021-06-09 DIAGNOSIS — J449 Chronic obstructive pulmonary disease, unspecified: Secondary | ICD-10-CM | POA: Insufficient documentation

## 2021-06-09 DIAGNOSIS — M5441 Lumbago with sciatica, right side: Secondary | ICD-10-CM | POA: Insufficient documentation

## 2021-06-09 LAB — BASIC METABOLIC PANEL
Anion gap: 8 (ref 5–15)
BUN: 13 mg/dL (ref 8–23)
CO2: 25 mmol/L (ref 22–32)
Calcium: 9.6 mg/dL (ref 8.9–10.3)
Chloride: 107 mmol/L (ref 98–111)
Creatinine, Ser: 0.93 mg/dL (ref 0.61–1.24)
GFR, Estimated: >60 mL/min (ref >60)
Glucose, Bld: 157 mg/dL — ABNORMAL HIGH (ref 70–99)
Potassium: 3.1 mmol/L — ABNORMAL LOW (ref 3.5–5.1)
Sodium: 140 mmol/L (ref 135–145)

## 2021-06-09 LAB — CBC
HCT: 44.4 % (ref 39.0–52.0)
Hemoglobin: 15.1 g/dL (ref 13.0–17.0)
MCH: 31.7 pg (ref 26.0–34.0)
MCHC: 34 g/dL (ref 30.0–36.0)
MCV: 93.1 fL (ref 80.0–100.0)
Platelets: 282 10*3/uL (ref 150–400)
RBC: 4.77 MIL/uL (ref 4.22–5.81)
RDW: 11.9 % (ref 11.5–15.5)
WBC: 6.9 10*3/uL (ref 4.0–10.5)
nRBC: 0 % (ref 0.0–0.2)

## 2021-06-09 MED ORDER — OXYCODONE-ACETAMINOPHEN 5-325 MG PO TABS
1.0000 | ORAL_TABLET | Freq: Once | ORAL | Status: AC
  Administered 2021-06-09: 1 via ORAL
  Filled 2021-06-09: qty 1

## 2021-06-09 MED ORDER — KETOROLAC TROMETHAMINE 30 MG/ML IJ SOLN
30.0000 mg | Freq: Once | INTRAMUSCULAR | Status: AC
  Administered 2021-06-09: 30 mg via INTRAMUSCULAR
  Filled 2021-06-09: qty 1

## 2021-06-09 MED ORDER — PREDNISONE 20 MG PO TABS
60.0000 mg | ORAL_TABLET | Freq: Once | ORAL | Status: AC
  Administered 2021-06-09: 60 mg via ORAL
  Filled 2021-06-09: qty 3

## 2021-06-09 MED ORDER — HYDROMORPHONE HCL 1 MG/ML IJ SOLN
1.0000 mg | INTRAMUSCULAR | Status: DC | PRN
  Administered 2021-06-09: 1 mg via INTRAVENOUS
  Filled 2021-06-09: qty 1

## 2021-06-09 MED ORDER — HYDROCODONE-ACETAMINOPHEN 5-325 MG PO TABS: 1.0000 | ORAL_TABLET | ORAL | 0 refills | Status: DC | PRN

## 2021-06-09 MED ORDER — POLYETHYLENE GLYCOL 3350 17 G PO PACK: 17.0000 g | PACK | Freq: Every day | ORAL | 0 refills | Status: DC

## 2021-06-09 MED ORDER — MORPHINE SULFATE (PF) 4 MG/ML IV SOLN
6.0000 mg | INTRAVENOUS | Status: DC | PRN
  Administered 2021-06-09: 6 mg via INTRAMUSCULAR
  Filled 2021-06-09: qty 2

## 2021-06-09 MED ORDER — PREDNISONE 10 MG PO TABS: 10.0000 mg | ORAL_TABLET | Freq: Every day | ORAL | 0 refills | Status: DC

## 2021-06-09 MED ORDER — CYCLOBENZAPRINE HCL 10 MG PO TABS
5.0000 mg | ORAL_TABLET | Freq: Once | ORAL | Status: AC
  Administered 2021-06-09: 5 mg via ORAL
  Filled 2021-06-09: qty 1

## 2021-06-09 NOTE — ED Notes (Signed)
Pt sleeping peacefully, resp even and un labored.

## 2021-06-09 NOTE — ED Notes (Signed)
Family member at bedside. Pt is talking to them in complete sentences while resting on the stretcher. Pt and family notified the food and drink are prohibited for pt until all testing in back.

## 2021-06-09 NOTE — ED Notes (Signed)
Pt states he needs "10 minutes" before he can attempt to ambulate.

## 2021-06-09 NOTE — ED Provider Notes (Addendum)
[facility] Provider Note    Event Date/Time   First MD Initiated Contact with Patient 06/09/21 [phone]     (approximate)   History   Leg Pain   HPI  Mathew Wyatt is a 61 y.o. male with smoking history and documented history of COPD presents to the ER for evaluation of more than 1 week of low back pain shooting down the back of his right leg.  Denies any specific injury.  States he does lift heavy objects at work.  Does report a history of degenerative disc disease in his back and will from time to time "throwout his back."  States he is having difficulty walking secondary to the pain.  States for the past several days he has been lying around on his couch.  States he will have some tingling in his feet but able to move his legs.  Denies any saddle anesthesia no loss of urinary or bowel incontinence     Physical Exam   Triage Vital Signs: ED Triage Vitals  Enc Vitals Group     BP 06/09/21 0452 (!) 148/86     Pulse Rate 06/09/21 0452 (!) 56     Resp 06/09/21 0452 16     Temp 06/09/21 0452 (!) 97.5 F (36.4 C)     Temp Source 06/09/21 0452 Oral     SpO2 06/09/21 0452 98 %     Weight 06/09/21 0451 180 lb (81.6 kg)     Height 06/09/21 0451 6' (1.829 m)     Head Circumference --      Peak Flow --      Pain Score 06/09/21 0451 10     Pain Loc --      Pain Edu? --      Excl. in GC? --     Most recent vital signs: Vitals:   06/09/21 0824 06/09/21 0830  BP:  (!) 177/82  Pulse:  (!) 56  Resp: 14   Temp:    SpO2:  92%     Constitutional: Alert  Eyes: Conjunctivae are normal.  Head: Atraumatic. Nose: No congestion/rhinnorhea. Mouth/Throat: Mucous membranes are moist.   Neck: Painless ROM.  Cardiovascular:   Good peripheral circulation.  Strong pt and dp pulse bilaterally.   Respiratory: Normal respiratory effort.  No retractions.  Gastrointestinal: Soft and nontender.  Musculoskeletal:  no deformity,  DTRs equal and present, sensation intact  bilaterally Neurologic:  MAE spontaneously. No gross focal neurologic deficits are appreciated.  Skin:  Skin is warm, dry and intact. No rash noted. Psychiatric: Mood and affect are normal. Speech and behavior are normal.    ED Results / Procedures / Treatments   Labs (all labs ordered are listed, but only abnormal results are displayed) Labs Reviewed - No data to display   EKG     RADIOLOGY    PROCEDURES:  Critical Care performed:   Procedures   MEDICATIONS ORDERED IN ED: Medications  morphine (PF) 4 MG/ML injection 6 mg (6 mg Intramuscular Given 06/09/21 0753)  oxyCODONE-acetaminophen (PERCOCET/ROXICET) 5-325 MG per tablet 1 tablet (has no administration in time range)  oxyCODONE-acetaminophen (PERCOCET/ROXICET) 5-325 MG per tablet 1 tablet (1 tablet Oral Given 06/09/21 0730)  ketorolac (TORADOL) 30 MG/ML injection 30 mg (30 mg Intramuscular Given 06/09/21 0730)  predniSONE (DELTASONE) tablet 60 mg (60 mg Oral Given 06/09/21 0730)  cyclobenzaprine (FLEXERIL) tablet 5 mg (5 mg Oral Given 06/09/21 0751)     IMPRESSION / MDM / ASSESSMENT AND PLAN /  ED COURSE  I reviewed the triage vital signs and the nursing notes.                              Differential diagnosis includes, but is not limited to, sciatica, msk strain, fracture, not ss, ce, AAA  Patient here with low back pain radiating down right leg c/w sciatica.  No history of injury or trauma. No recent back instrumentation/procedures. No fevers. Denies cord compression symptoms. No bowel/bladder incontinence or retention, no LE weakness. VSS in ED. Exam with no LE weakness bilat., no sensory deficits, normal DTRs, no clonus, no saddle anesthesia.  Patient voluntarily moving rle without signs of distress or significant discomfort. Treatments will include observation, analgesia, and arrange appropriate follow up for recheck.  Clinical picture is not consistent with epidural abscess , fracture, or cauda equina syndrome.  Plan supportive care, follow up for recheck    Clinical Course as of 06/09/21 1220  Tue Jun 09, 2021  0932 Patient reassessed assessed.  He is resting comfortably in no cute distress.  Able to move with improvement in discomfort.  Still pain with ambulation but it is improved.  Requesting additional PO pain medication as concerned may not be able to afford meds.  Will give additional meds here in ER.   Does appear stable and appropriate for discharge and outpatient follow-up. [PR]  1220 CT Renal [name] [PR]    Clinical Course User Index [PR] Willy Eddy, MD     FINAL CLINICAL IMPRESSION(S) / ED DIAGNOSES   Final diagnoses:  Acute right-sided low back pain with right-sided sciatica     Rx / DC Orders   ED Discharge Orders          Ordered    HYDROcodone-acetaminophen (NORCO) 5-325 MG tablet  Every 4 hours PRN        06/09/21 0744    predniSONE (DELTASONE) 10 MG tablet  Daily        06/09/21 0744             Note:  This document was prepared using Dragon voice recognition software and may include unintentional dictation errors.         Willy Eddy, MD 06/09/21 6712    Willy Eddy, MD 06/09/21 [phone]

## 2021-06-09 NOTE — ED Notes (Signed)
Pt able to ambulate to bathroom, pt awaiting ride home.

## 2021-06-09 NOTE — ED Triage Notes (Signed)
BIB [facility] from home c/o right leg pain that starts in right lower back and radiates down entire right leg X 9 days. Has tried ibuprofen and tylenol at home with no relief. Reports pain as constant with 1 days of relief in the last 9.

## 2021-06-09 NOTE — Discharge Instructions (Signed)

## 2021-06-09 NOTE — ED Notes (Addendum)
Pt reports he is unable to attempt to ambulate at his time due to pain. RN explained this was necessary for discharge.

## 2021-07-14 ENCOUNTER — Telehealth: Payer: Self-pay | Admitting: General Practice

## 2023-02-07 ENCOUNTER — Encounter (HOSPITAL_COMMUNITY): Payer: Self-pay

## 2023-02-07 ENCOUNTER — Emergency Department: Payer: Self-pay

## 2023-02-07 ENCOUNTER — Inpatient Hospital Stay (HOSPITAL_COMMUNITY): Admit: 2023-02-07 | Payer: Self-pay | Admitting: Internal Medicine

## 2023-02-07 ENCOUNTER — Emergency Department
Admission: EM | Admit: 2023-02-07 | Discharge: 2023-02-08 | Disposition: A | Discharge status: Other Acute Inpatient Hospital | Payer: Self-pay | Attending: Emergency Medicine | Admitting: Emergency Medicine

## 2023-02-07 ENCOUNTER — Other Ambulatory Visit: Payer: Self-pay

## 2023-02-07 DIAGNOSIS — F17210 Nicotine dependence, cigarettes, uncomplicated: Secondary | ICD-10-CM | POA: Diagnosis not present

## 2023-02-07 DIAGNOSIS — A419 Sepsis, unspecified organism: Secondary | ICD-10-CM | POA: Insufficient documentation

## 2023-02-07 DIAGNOSIS — R6521 Severe sepsis with septic shock: Secondary | ICD-10-CM | POA: Diagnosis not present

## 2023-02-07 DIAGNOSIS — J869 Pyothorax without fistula: Secondary | ICD-10-CM | POA: Diagnosis not present

## 2023-02-07 DIAGNOSIS — J948 Other specified pleural conditions: Secondary | ICD-10-CM | POA: Insufficient documentation

## 2023-02-07 DIAGNOSIS — J449 Chronic obstructive pulmonary disease, unspecified: Secondary | ICD-10-CM | POA: Insufficient documentation

## 2023-02-07 DIAGNOSIS — R0602 Shortness of breath: Secondary | ICD-10-CM | POA: Diagnosis present

## 2023-02-07 LAB — COMPREHENSIVE METABOLIC PANEL
ALT: 22 U/L (ref 0–44)
AST: 33 U/L (ref 15–41)
Albumin: 1.8 g/dL — ABNORMAL LOW (ref 3.5–5.0)
Alkaline Phosphatase: 157 U/L — ABNORMAL HIGH (ref 38–126)
Anion gap: 17 — ABNORMAL HIGH (ref 5–15)
BUN: 25 mg/dL — ABNORMAL HIGH (ref 8–23)
CO2: 24 mmol/L (ref 22–32)
Calcium: 8.9 mg/dL (ref 8.9–10.3)
Chloride: 93 mmol/L — ABNORMAL LOW (ref 98–111)
Creatinine, Ser: 0.92 mg/dL (ref 0.61–1.24)
GFR, Estimated: >60 mL/min (ref >60)
Glucose, Bld: 237 mg/dL — ABNORMAL HIGH (ref 70–99)
Potassium: 4.2 mmol/L (ref 3.5–5.1)
Sodium: 134 mmol/L — ABNORMAL LOW (ref 135–145)
Total Bilirubin: 2.1 mg/dL — ABNORMAL HIGH (ref 0.0–1.2)
Total Protein: 8 g/dL (ref 6.5–8.1)

## 2023-02-07 LAB — LACTIC ACID, PLASMA
Lactic Acid, Venous: 4.4 mmol/L (ref 0.5–1.9)
Lactic Acid, Venous: 3.1 mmol/L (ref 0.5–1.9)

## 2023-02-07 LAB — CBC WITH DIFFERENTIAL/PLATELET
Abs Immature Granulocytes: 0.67 10*3/uL — ABNORMAL HIGH (ref 0.00–0.07)
Basophils Absolute: 0.1 10*3/uL (ref 0.0–0.1)
Basophils Relative: 0 %
Eosinophils Absolute: 0 10*3/uL (ref 0.0–0.5)
Eosinophils Relative: 0 %
HCT: 35.3 % — ABNORMAL LOW (ref 39.0–52.0)
Hemoglobin: 11.3 g/dL — ABNORMAL LOW (ref 13.0–17.0)
Immature Granulocytes: 2 %
Lymphocytes Relative: 3 %
Lymphs Abs: 1.2 10*3/uL (ref 0.7–4.0)
MCH: 30.1 pg (ref 26.0–34.0)
MCHC: 32 g/dL (ref 30.0–36.0)
MCV: 93.9 fL (ref 80.0–100.0)
Monocytes Absolute: 1.4 10*3/uL — ABNORMAL HIGH (ref 0.1–1.0)
Monocytes Relative: 4 %
Neutro Abs: 32.3 10*3/uL — ABNORMAL HIGH (ref 1.7–7.7)
Neutrophils Relative %: 91 %
Platelets: 396 10*3/uL (ref 150–400)
RBC: 3.76 MIL/uL — ABNORMAL LOW (ref 4.22–5.81)
RDW: 15.3 % (ref 11.5–15.5)
Smear Review: UNDETERMINED
WBC Morphology: INCREASED
WBC: 35.7 10*3/uL — ABNORMAL HIGH (ref 4.0–10.5)
nRBC: 0 % (ref 0.0–0.2)

## 2023-02-07 LAB — LIPASE, BLOOD: Lipase: 17 U/L (ref 11–51)

## 2023-02-07 LAB — TROPONIN I (HIGH SENSITIVITY)
Troponin I (High Sensitivity): 59 ng/L — ABNORMAL HIGH (ref <18)
Troponin I (High Sensitivity): 50 ng/L — ABNORMAL HIGH (ref <18)

## 2023-02-07 MED ORDER — MORPHINE SULFATE (PF) 4 MG/ML IV SOLN
4.0000 mg | Freq: Once | INTRAVENOUS | Status: AC
  Administered 2023-02-07: 4 mg via INTRAVENOUS
  Filled 2023-02-07: qty 1

## 2023-02-07 MED ORDER — METRONIDAZOLE 500 MG/100ML IV SOLN
500.0000 mg | Freq: Once | INTRAVENOUS | Status: AC
  Administered 2023-02-07: 500 mg via INTRAVENOUS
  Filled 2023-02-07: qty 100

## 2023-02-07 MED ORDER — SODIUM CHLORIDE 0.9 % IV SOLN
2.0000 g | Freq: Once | INTRAVENOUS | Status: AC
  Administered 2023-02-07: 2 g via INTRAVENOUS
  Filled 2023-02-07: qty 12.5

## 2023-02-07 MED ORDER — SODIUM CHLORIDE 0.9 % IV BOLUS (SEPSIS)
1000.0000 mL | Freq: Once | INTRAVENOUS | Status: AC
  Administered 2023-02-07: 1000 mL via INTRAVENOUS

## 2023-02-07 MED ORDER — SODIUM CHLORIDE 0.9 % IV BOLUS
1000.0000 mL | Freq: Once | INTRAVENOUS | Status: AC
  Administered 2023-02-07: 1000 mL via INTRAVENOUS

## 2023-02-07 MED ORDER — IOHEXOL 300 MG/ML  SOLN
100.0000 mL | Freq: Once | INTRAMUSCULAR | Status: AC | PRN
  Administered 2023-02-07: 100 mL via INTRAVENOUS

## 2023-02-07 MED ORDER — VANCOMYCIN HCL IN DEXTROSE 1-5 GM/200ML-% IV SOLN
1000.0000 mg | Freq: Once | INTRAVENOUS | Status: AC
  Administered 2023-02-07: 1000 mg via INTRAVENOUS
  Filled 2023-02-07: qty 200

## 2023-02-07 NOTE — Sepsis Progress Note (Signed)
Sepsis protocol is being followed by [facility]. 

## 2023-02-07 NOTE — ED Triage Notes (Addendum)
First nurse note: pt to ED [facility] From home for weakness, has not been eating or drinking for last few weeks. +cough. HR 115. Pt skin color appears jaundice

## 2023-02-07 NOTE — ED Triage Notes (Signed)
Pt says, "I think I have pneumonia." Pt reports feeling unwell x2 weeks with SOB and extreme fatigue. Pt unsure of fevers. Pt does not follow with a primary MD and does not take any medications. Pt appears jaundice.

## 2023-02-07 NOTE — ED Provider Triage Note (Signed)
Emergency Medicine Provider Triage Evaluation Note  Mathew Wyatt , a 63 y.o. male  was evaluated in triage.  Pt complains of cough, weakness, left sided chest pain x2 weeks. No fever.   Review of Systems  Positive: Cough, chest pain, weakness Negative: Leg swelling, fever  Physical Exam  BP 102/65 (BP Location: Left Arm)   Pulse (!) 122   Temp 97.7 F (36.5 C) (Oral)   Resp 20   Ht 6' (1.829 m)   Wt 68 kg   SpO2 92%   BMI 20.34 kg/m  Gen:   Awake, no distress   Resp:  Normal effort  MSK:   Moves extremities without difficulty  Other:  Chronically ill appearing  Medical Decision Making  Medically screening exam initiated at 3:15 PM.  Appropriate orders placed.  Mathew Wyatt was informed that the remainder of the evaluation will be completed by another provider, this initial triage assessment does not replace that evaluation, and the importance of remaining in the ED until their evaluation is complete.     Jackelyn Hoehn, PA-C 02/07/23 1517

## 2023-02-07 NOTE — Sepsis Progress Note (Signed)
Notified provider of need to consider ordering another liter of fluid to meet the sepsis protocol for LA of 4.4.

## 2023-02-07 NOTE — ED Notes (Signed)
Called to [facility]) per MD Ward/ Pos Changes in Acceptance/ ED to ED/ Rep Tammy stated she will give [place] a call back.

## 2023-02-07 NOTE — Consult Note (Signed)
CODE SEPSIS - PHARMACY COMMUNICATION  **Broad Spectrum Antibiotics should be administered within 1 hour of Sepsis diagnosis**  Time Code Sepsis Called/Page Received: 1723  Antibiotics Ordered: cefepime, vancomycin, and metronidazole  Time of 1st antibiotic administration: cefepime at 1741  Additional action taken by pharmacy: none  If necessary, Name of Provider/Nurse Contacted: n/a   Shewanda Sharpe Rodriguez-Guzman PharmD, BCPS 02/07/2023 5:55 PM

## 2023-02-07 NOTE — ED Notes (Signed)
Called to [facility]) per MD Anner Crete @ 712p/consult to transfer/Rep:Tammy.Marland Kitchen

## 2023-02-07 NOTE — ED Notes (Signed)
Called to [facility]) for pt update/waiting on a bed assignment [facility].

## 2023-02-07 NOTE — ED Provider Notes (Signed)
[facility] Provider Note    Event Date/Time   First MD Initiated Contact with Patient 02/07/23 1712     (approximate)   History   Shortness of Breath   HPI Mathew Wyatt is a 63 y.o. male with history of COPD presents today for shortness of breath.  Patient states for the past 2 weeks he has had worsening shortness of breath and productive cough.  Short of breath when ambulating.  Denies fever, chills, chest pain, abdominal pain, nausea, vomiting, diarrhea, constipation.  States 1 pack/day smoking throughout the majority of his life.  No recent alcohol use.  Denies any other chronic medical problems.     Physical Exam   Triage Vital Signs: ED Triage Vitals  Encounter Vitals Group     BP 02/07/23 1511 102/65     Systolic BP Percentile --      Diastolic BP Percentile --      Pulse Rate 02/07/23 1511 (!) 122     Resp 02/07/23 1511 20     Temp 02/07/23 1511 97.7 F (36.5 C)     Temp Source 02/07/23 1511 Oral     SpO2 02/07/23 1511 92 %     Weight 02/07/23 1511 150 lb (68 kg)     Height 02/07/23 1511 6' (1.829 m)     Head Circumference --      Peak Flow --      Pain Score 02/07/23 1524 9     Pain Loc --      Pain Education --      Exclude from Growth Chart --     Most recent vital signs: Vitals:   02/07/23 2200 02/07/23 2230  BP: (!) 102/53 (!) 106/54  Pulse: (!) 105 (!) 105  Resp: (!) 22 (!) 28  Temp:    SpO2: 90% 93%   Physical Exam: I have reviewed the vital signs and nursing notes. General: Awake, alert, no acute distress.  Toxic appearing. Head:  Atraumatic, normocephalic.   ENT:  EOM intact, PERRL. Oral mucosa is pink and moist with no lesions. Neck: Neck is supple with full range of motion, No meningeal signs. Cardiovascular:  tachycardic, RR, No murmurs. Peripheral pulses palpable and equal bilaterally. Respiratory:  Symmetrical chest wall expansion.  Slight tachypnea.  Diminished breath sounds in the left base.  No  wheezing. Musculoskeletal:  No cyanosis or edema. Moving extremities with full ROM Abdomen:  Soft, nontender, nondistended. Neuro:  GCS 15, moving all four extremities, interacting appropriately. Speech clear. Psych:  Calm, appropriate.   Skin:  Warm, dry, no rash.    ED Results / Procedures / Treatments   Labs (all labs ordered are listed, but only abnormal results are displayed) Labs Reviewed  COMPREHENSIVE METABOLIC PANEL - Abnormal; Notable for the following components:      Result Value   Sodium 134 (*)    Chloride 93 (*)    Glucose, Bld 237 (*)    BUN 25 (*)    Albumin 1.8 (*)    Alkaline Phosphatase 157 (*)    Total Bilirubin 2.1 (*)    Anion gap 17 (*)    All other components within normal limits  CBC WITH DIFFERENTIAL/PLATELET - Abnormal; Notable for the following components:   WBC 35.7 (*)    RBC 3.76 (*)    Hemoglobin 11.3 (*)    HCT 35.3 (*)    Neutro Abs 32.3 (*)    Monocytes Absolute 1.4 (*)    Abs Immature  Granulocytes 0.67 (*)    All other components within normal limits  LACTIC ACID, PLASMA - Abnormal; Notable for the following components:   Lactic Acid, Venous 4.4 (*)    All other components within normal limits  LACTIC ACID, PLASMA - Abnormal; Notable for the following components:   Lactic Acid, Venous 3.1 (*)    All other components within normal limits  TROPONIN I (HIGH SENSITIVITY) - Abnormal; Notable for the following components:   Troponin I (High Sensitivity) 59 (*)    All other components within normal limits  TROPONIN I (HIGH SENSITIVITY) - Abnormal; Notable for the following components:   Troponin I (High Sensitivity) 50 (*)    All other components within normal limits  RESP PANEL BY RT-PCR (RSV, FLU A&B, COVID)  RVPGX2  CULTURE, BLOOD (SINGLE)  LIPASE, BLOOD     EKG My EKG interpretation: Rate of 124, sinus tachycardia, normal axis, normal intervals.  No acute ST elevations or depressions   RADIOLOGY Independently interpreted chest  x-ray and CT chest with evidence of large left-sided hydropneumothorax with empyema versus potential abscess   PROCEDURES:  Critical Care performed: Yes, see critical care procedure note(s)  .Critical Care  Performed by: Janith Lima, MD Authorized by: Janith Lima, MD   Critical care provider statement:    Critical care time (minutes):  30   Critical care was necessary to treat or prevent imminent or life-threatening deterioration of the following conditions:  Sepsis   Critical care was time spent personally by me on the following activities:  Development of treatment plan with patient or surrogate, discussions with consultants, evaluation of patient's response to treatment, examination of patient, ordering and review of laboratory studies, ordering and review of radiographic studies, ordering and performing treatments and interventions, pulse oximetry, re-evaluation of patient's condition and review of old charts   I assumed direction of critical care for this patient from another provider in my specialty: no     Care discussed with: admitting provider      MEDICATIONS ORDERED IN ED: Medications  sodium chloride 0.9 % bolus 1,000 mL (0 mLs Intravenous Stopped 02/07/23 1948)  ceFEPIme (MAXIPIME) 2 g in sodium chloride 0.9 % 100 mL IVPB (0 g Intravenous Stopped 02/07/23 1823)  metroNIDAZOLE (FLAGYL) IVPB 500 mg (0 mg Intravenous Stopped 02/07/23 1948)  vancomycin (VANCOCIN) IVPB 1000 mg/200 mL premix (0 mg Intravenous Stopped 02/07/23 2106)  iohexol (OMNIPAQUE) 300 MG/ML solution 100 mL (100 mLs Intravenous Contrast Given 02/07/23 1811)  morphine (PF) 4 MG/ML injection 4 mg (4 mg Intravenous Given 02/07/23 1848)  sodium chloride 0.9 % bolus 1,000 mL (0 mLs Intravenous Stopped 02/07/23 1848)     IMPRESSION / MDM / ASSESSMENT AND PLAN / ED COURSE  I reviewed the triage vital signs and the nursing notes.                              Differential diagnosis includes, but is not limited  to, sepsis, pneumonia, empyema, abscess, pneumothorax, COPD exacerbation  Patient's presentation is most consistent with acute presentation with potential threat to life or bodily function.  Patient is a 63 year old male presenting today for shortness of breath x 2 weeks.  On arrival he is tachycardic and slightly tachypneic but not hypoxic on room air.  Laboratory workup notable for significant leukocytosis at 35.7.  CMP otherwise largely unremarkable outside of acidosis likely secondary to elevated lactic at 4.4.  He was started  on broad-spectrum antibiotics and given 2 L fluid.  This resulted in improvement of his lactic to 3.1.  Initial chest x-ray showed concern for hydropneumothorax possible empyema.  Follow-up CT chest was ordered for further evaluation.  This showed large left-sided loculated hydropneumothorax likely representing empyema or abscess.  Discussed the case with our general surgery team in regards to possible chest tube placement and transfer.  They recommended discussion with CT surgery at [facility].  Discussed case with CT surgery at [facility] who does recommend transfer for them to perform a VATS procedure.  I asked them if patient would require a chest tube either large bore pigtail prior to transfer.  He stated with him not being hypoxic and his respiratory rate relatively stable at this time that this would not be needed until after transfer.  Hospitalist at [facility] is agreed to accept patient for transfer.  Patient continued to be monitored in the ED until that time.  Patient signed out to oncoming provider pending transfer.  The patient is on the cardiac monitor to evaluate for evidence of arrhythmia and/or significant heart rate changes. Clinical Course as of 02/07/23 2304  Mon Feb 07, 2023  1816 Lactic Acid, Venous(!!): 4.4 Patient reassessed. 2nd liter of fluid ordered [DW]  1924 Spoke with her general surgery team here at [facility].  Stated that patient likely needs a  VATS procedure and that we will need consultation with the CT surgeon.  Spoke with CT surgery at [facility].  He states that patient can be transferred to the hospitalist service there and they will work on the procedure after arrival.  I asked him if patient needed a chest tube or pigtail for transfer and he said no if he is not hypoxic or other obvious respiratory distress at this time.  Patient is 97% on room air with respiratory rate around 20. [DW]  [address] [facility] hospitalist will accept. Request a call back if lactic is trending upwards as he may need to go to ICU [DW]  2304 Improving lactic acid. Will admit to [facility] [DW]    Clinical Course User Index [DW] Janith Lima, MD     FINAL CLINICAL IMPRESSION(S) / ED DIAGNOSES   Final diagnoses:  Empyema (HCC)  Hydropneumothorax  Sepsis with acute organ dysfunction and septic shock, due to unspecified organism, unspecified organ dysfunction type [facility])     Rx / DC Orders   ED Discharge Orders     None        Note:  This document was prepared using Dragon voice recognition software and may include unintentional dictation errors.   Janith Lima, MD 02/07/23 [phone]

## 2023-02-07 NOTE — ED Provider Notes (Signed)
11:37 PM  Patient here with sepsis from empyema and hydropneumothorax awaiting transfer to [facility] hospitalist service to see CT surgery for VATS procedure.  Still no bed available at this time.  Discussed with [facility] and Dr. Bebe Shaggy in the ED to see if patient would be appropriate for ED to ED transfer given he is severely septic and needs surgical procedure with CT surgery which is not available at [facility].  Dr. Bebe Shaggy agrees to accept an ED to ED transfer.  Appreciate his help.  Patient is getting broad-spectrum antibiotics, IV fluids.  Sats in the low 90s on nasal cannula.  Will continue to monitor closely.   Aniesa Boback, Layla Maw, DO 02/07/23 2339

## 2023-02-08 ENCOUNTER — Inpatient Hospital Stay (HOSPITAL_COMMUNITY): Payer: Medicaid Other

## 2023-02-08 ENCOUNTER — Other Ambulatory Visit: Payer: Self-pay

## 2023-02-08 ENCOUNTER — Encounter (HOSPITAL_COMMUNITY): Payer: Self-pay | Admitting: Emergency Medicine

## 2023-02-08 ENCOUNTER — Inpatient Hospital Stay (HOSPITAL_COMMUNITY)
Admission: EM | Admit: 2023-02-08 | Discharge: 2023-03-07 | DRG: 853 | Disposition: A | Discharge status: Home / Self Care | Payer: Medicaid Other | Source: Other Acute Inpatient Hospital | Attending: Internal Medicine | Admitting: Internal Medicine

## 2023-02-08 DIAGNOSIS — I5021 Acute systolic (congestive) heart failure: Secondary | ICD-10-CM | POA: Diagnosis not present

## 2023-02-08 DIAGNOSIS — T85628A Displacement of other specified internal prosthetic devices, implants and grafts, initial encounter: Secondary | ICD-10-CM | POA: Diagnosis not present

## 2023-02-08 DIAGNOSIS — L89311 Pressure ulcer of right buttock, stage 1: Secondary | ICD-10-CM | POA: Clinically undetermined

## 2023-02-08 DIAGNOSIS — F17210 Nicotine dependence, cigarettes, uncomplicated: Secondary | ICD-10-CM | POA: Diagnosis present

## 2023-02-08 DIAGNOSIS — I480 Paroxysmal atrial fibrillation: Secondary | ICD-10-CM | POA: Diagnosis present

## 2023-02-08 DIAGNOSIS — Z4682 Encounter for fitting and adjustment of non-vascular catheter: Secondary | ICD-10-CM

## 2023-02-08 DIAGNOSIS — R6521 Severe sepsis with septic shock: Secondary | ICD-10-CM | POA: Diagnosis present

## 2023-02-08 DIAGNOSIS — T3695XA Adverse effect of unspecified systemic antibiotic, initial encounter: Secondary | ICD-10-CM | POA: Diagnosis not present

## 2023-02-08 DIAGNOSIS — A419 Sepsis, unspecified organism: Secondary | ICD-10-CM | POA: Insufficient documentation

## 2023-02-08 DIAGNOSIS — I429 Cardiomyopathy, unspecified: Secondary | ICD-10-CM | POA: Diagnosis not present

## 2023-02-08 DIAGNOSIS — E872 Acidosis, unspecified: Secondary | ICD-10-CM | POA: Diagnosis present

## 2023-02-08 DIAGNOSIS — Z79899 Other long term (current) drug therapy: Secondary | ICD-10-CM

## 2023-02-08 DIAGNOSIS — E870 Hyperosmolality and hypernatremia: Secondary | ICD-10-CM | POA: Diagnosis not present

## 2023-02-08 DIAGNOSIS — J440 Chronic obstructive pulmonary disease with acute lower respiratory infection: Secondary | ICD-10-CM | POA: Diagnosis present

## 2023-02-08 DIAGNOSIS — K521 Toxic gastroenteritis and colitis: Secondary | ICD-10-CM | POA: Diagnosis not present

## 2023-02-08 DIAGNOSIS — N179 Acute kidney failure, unspecified: Secondary | ICD-10-CM | POA: Diagnosis not present

## 2023-02-08 DIAGNOSIS — J9601 Acute respiratory failure with hypoxia: Secondary | ICD-10-CM | POA: Diagnosis present

## 2023-02-08 DIAGNOSIS — J189 Pneumonia, unspecified organism: Secondary | ICD-10-CM | POA: Diagnosis present

## 2023-02-08 DIAGNOSIS — I4891 Unspecified atrial fibrillation: Secondary | ICD-10-CM

## 2023-02-08 DIAGNOSIS — E876 Hypokalemia: Secondary | ICD-10-CM | POA: Diagnosis not present

## 2023-02-08 DIAGNOSIS — R627 Adult failure to thrive: Secondary | ICD-10-CM | POA: Diagnosis present

## 2023-02-08 DIAGNOSIS — J869 Pyothorax without fistula: Principal | ICD-10-CM | POA: Diagnosis present

## 2023-02-08 DIAGNOSIS — A045 Campylobacter enteritis: Secondary | ICD-10-CM | POA: Diagnosis not present

## 2023-02-08 DIAGNOSIS — I3139 Other pericardial effusion (noninflammatory): Secondary | ICD-10-CM | POA: Diagnosis present

## 2023-02-08 DIAGNOSIS — J95812 Postprocedural air leak: Secondary | ICD-10-CM | POA: Diagnosis not present

## 2023-02-08 DIAGNOSIS — B954 Other streptococcus as the cause of diseases classified elsewhere: Secondary | ICD-10-CM | POA: Diagnosis not present

## 2023-02-08 DIAGNOSIS — A409 Streptococcal sepsis, unspecified: Secondary | ICD-10-CM | POA: Diagnosis present

## 2023-02-08 DIAGNOSIS — I5033 Acute on chronic diastolic (congestive) heart failure: Secondary | ICD-10-CM

## 2023-02-08 DIAGNOSIS — K029 Dental caries, unspecified: Secondary | ICD-10-CM | POA: Diagnosis present

## 2023-02-08 DIAGNOSIS — J948 Other specified pleural conditions: Secondary | ICD-10-CM | POA: Diagnosis present

## 2023-02-08 DIAGNOSIS — E871 Hypo-osmolality and hyponatremia: Secondary | ICD-10-CM | POA: Diagnosis present

## 2023-02-08 DIAGNOSIS — B9689 Other specified bacterial agents as the cause of diseases classified elsewhere: Secondary | ICD-10-CM | POA: Diagnosis present

## 2023-02-08 DIAGNOSIS — F419 Anxiety disorder, unspecified: Secondary | ICD-10-CM | POA: Diagnosis not present

## 2023-02-08 DIAGNOSIS — Y838 Other surgical procedures as the cause of abnormal reaction of the patient, or of later complication, without mention of misadventure at the time of the procedure: Secondary | ICD-10-CM | POA: Diagnosis not present

## 2023-02-08 DIAGNOSIS — J939 Pneumothorax, unspecified: Secondary | ICD-10-CM | POA: Diagnosis not present

## 2023-02-08 DIAGNOSIS — R17 Unspecified jaundice: Secondary | ICD-10-CM | POA: Diagnosis present

## 2023-02-08 DIAGNOSIS — L8990 Pressure ulcer of unspecified site, unspecified stage: Secondary | ICD-10-CM

## 2023-02-08 DIAGNOSIS — J449 Chronic obstructive pulmonary disease, unspecified: Secondary | ICD-10-CM

## 2023-02-08 DIAGNOSIS — Z7901 Long term (current) use of anticoagulants: Secondary | ICD-10-CM

## 2023-02-08 DIAGNOSIS — G4700 Insomnia, unspecified: Secondary | ICD-10-CM | POA: Diagnosis not present

## 2023-02-08 DIAGNOSIS — D62 Acute posthemorrhagic anemia: Secondary | ICD-10-CM | POA: Diagnosis not present

## 2023-02-08 DIAGNOSIS — B955 Unspecified streptococcus as the cause of diseases classified elsewhere: Secondary | ICD-10-CM | POA: Diagnosis not present

## 2023-02-08 DIAGNOSIS — I959 Hypotension, unspecified: Secondary | ICD-10-CM | POA: Insufficient documentation

## 2023-02-08 DIAGNOSIS — J90 Pleural effusion, not elsewhere classified: Secondary | ICD-10-CM

## 2023-02-08 DIAGNOSIS — A403 Sepsis due to Streptococcus pneumoniae: Secondary | ICD-10-CM | POA: Diagnosis not present

## 2023-02-08 LAB — BODY FLUID CELL COUNT WITH DIFFERENTIAL
Other Cells, Fluid: UNDETERMINED %
Total Nucleated Cell Count, Fluid: 105000 uL — ABNORMAL HIGH (ref 0–1000)

## 2023-02-08 LAB — CBC WITH DIFFERENTIAL/PLATELET
Abs Immature Granulocytes: 0.36 10*3/uL — ABNORMAL HIGH (ref 0.00–0.07)
Basophils Absolute: 0.1 10*3/uL (ref 0.0–0.1)
Basophils Relative: 0 %
Eosinophils Absolute: 0.1 10*3/uL (ref 0.0–0.5)
Eosinophils Relative: 0 %
HCT: 31.6 % — ABNORMAL LOW (ref 39.0–52.0)
Hemoglobin: 10.3 g/dL — ABNORMAL LOW (ref 13.0–17.0)
Immature Granulocytes: 1 %
Lymphocytes Relative: 4 %
Lymphs Abs: 1.2 10*3/uL (ref 0.7–4.0)
MCH: 30.6 pg (ref 26.0–34.0)
MCHC: 32.6 g/dL (ref 30.0–36.0)
MCV: 93.8 fL (ref 80.0–100.0)
Monocytes Absolute: 1.1 10*3/uL — ABNORMAL HIGH (ref 0.1–1.0)
Monocytes Relative: 4 %
Neutro Abs: 23.8 10*3/uL — ABNORMAL HIGH (ref 1.7–7.7)
Neutrophils Relative %: 91 %
Platelets: 300 10*3/uL (ref 150–400)
RBC: 3.37 MIL/uL — ABNORMAL LOW (ref 4.22–5.81)
RDW: 15.2 % (ref 11.5–15.5)
WBC: 26.5 10*3/uL — ABNORMAL HIGH (ref 4.0–10.5)
nRBC: 0.1 % (ref 0.0–0.2)

## 2023-02-08 LAB — CREATININE, SERUM
Creatinine, Ser: 0.72 mg/dL (ref 0.61–1.24)
GFR, Estimated: >60 mL/min (ref >60)

## 2023-02-08 LAB — LACTATE DEHYDROGENASE, PLEURAL OR PERITONEAL FLUID: LD, Fluid: >2500 U/L — ABNORMAL HIGH (ref 3–23)

## 2023-02-08 LAB — ECHOCARDIOGRAM COMPLETE
Area-P 1/2: 3.85 cm2
S' Lateral: 3.4 cm

## 2023-02-08 LAB — LACTIC ACID, PLASMA: Lactic Acid, Venous: 2.3 mmol/L (ref 0.5–1.9)

## 2023-02-08 LAB — HIV ANTIBODY (ROUTINE TESTING W REFLEX): HIV Screen 4th Generation wRfx: NONREACTIVE

## 2023-02-08 LAB — APTT: aPTT: 48 s — ABNORMAL HIGH (ref 24–36)

## 2023-02-08 LAB — MRSA NEXT GEN BY PCR, NASAL: MRSA by PCR Next Gen: DETECTED — AB

## 2023-02-08 LAB — CBC
HCT: 30.7 % — ABNORMAL LOW (ref 39.0–52.0)
Hemoglobin: 9.8 g/dL — ABNORMAL LOW (ref 13.0–17.0)
MCH: 30.2 pg (ref 26.0–34.0)
MCHC: 31.9 g/dL (ref 30.0–36.0)
MCV: 94.8 fL (ref 80.0–100.0)
Platelets: 336 10*3/uL (ref 150–400)
RBC: 3.24 MIL/uL — ABNORMAL LOW (ref 4.22–5.81)
RDW: 15.4 % (ref 11.5–15.5)
WBC: 31 10*3/uL — ABNORMAL HIGH (ref 4.0–10.5)
nRBC: 0 % (ref 0.0–0.2)

## 2023-02-08 LAB — RAPID URINE DRUG SCREEN, HOSP PERFORMED
Amphetamines: NOT DETECTED
Barbiturates: NOT DETECTED
Benzodiazepines: NOT DETECTED
Cocaine: NOT DETECTED
Opiates: POSITIVE — AB
Tetrahydrocannabinol: NOT DETECTED

## 2023-02-08 LAB — PROTIME-INR
INR: 1.3 — ABNORMAL HIGH (ref 0.8–1.2)
INR: 1.6 — ABNORMAL HIGH (ref 0.8–1.2)
Prothrombin Time: 16.7 s — ABNORMAL HIGH (ref 11.4–15.2)
Prothrombin Time: 19.6 s — ABNORMAL HIGH (ref 11.4–15.2)

## 2023-02-08 LAB — BASIC METABOLIC PANEL
Anion gap: 9 (ref 5–15)
BUN: 19 mg/dL (ref 8–23)
CO2: 25 mmol/L (ref 22–32)
Calcium: 8.2 mg/dL — ABNORMAL LOW (ref 8.9–10.3)
Chloride: 100 mmol/L (ref 98–111)
Creatinine, Ser: 0.82 mg/dL (ref 0.61–1.24)
GFR, Estimated: >60 mL/min (ref >60)
Glucose, Bld: 153 mg/dL — ABNORMAL HIGH (ref 70–99)
Potassium: 3.8 mmol/L (ref 3.5–5.1)
Sodium: 134 mmol/L — ABNORMAL LOW (ref 135–145)

## 2023-02-08 LAB — CORTISOL-AM, BLOOD: Cortisol - AM: 50.1 ug/dL — ABNORMAL HIGH (ref 6.7–22.6)

## 2023-02-08 LAB — PROTEIN, PLEURAL OR PERITONEAL FLUID: Total protein, fluid: <3 g/dL

## 2023-02-08 LAB — PROCALCITONIN: Procalcitonin: 0.87 ng/mL

## 2023-02-08 LAB — GLUCOSE, PLEURAL OR PERITONEAL FLUID: Glucose, Fluid: <20 mg/dL

## 2023-02-08 LAB — I-STAT CG4 LACTIC ACID, ED: Lactic Acid, Venous: 2.8 mmol/L (ref 0.5–1.9)

## 2023-02-08 MED ORDER — SODIUM CHLORIDE 0.9 % IV SOLN: INTRAVENOUS | Status: AC

## 2023-02-08 MED ORDER — KETOROLAC TROMETHAMINE 30 MG/ML IJ SOLN
30.0000 mg | Freq: Three times a day (TID) | INTRAMUSCULAR | Status: AC | PRN
  Administered 2023-02-08 – 2023-02-09 (×3): 30 mg via INTRAVENOUS
  Filled 2023-02-08 (×4): qty 1

## 2023-02-08 MED ORDER — SODIUM CHLORIDE (PF) 0.9 % IJ SOLN
10.0000 mg | Freq: Once | INTRAMUSCULAR | Status: AC
  Administered 2023-02-08: 10 mg via INTRAPLEURAL
  Filled 2023-02-08: qty 10

## 2023-02-08 MED ORDER — METOPROLOL TARTRATE 5 MG/5ML IV SOLN
2.5000 mg | INTRAVENOUS | Status: DC | PRN
  Administered 2023-02-14: 5 mg via INTRAVENOUS
  Filled 2023-02-08: qty 5

## 2023-02-08 MED ORDER — STERILE WATER FOR INJECTION IJ SOLN
5.0000 mg | Freq: Once | RESPIRATORY_TRACT | Status: AC
  Administered 2023-02-08: 5 mg via INTRAPLEURAL
  Filled 2023-02-08: qty 5

## 2023-02-08 MED ORDER — GERHARDT'S BUTT CREAM
TOPICAL_CREAM | Freq: Three times a day (TID) | CUTANEOUS | Status: DC
  Administered 2023-02-10 – 2023-02-28 (×3): 1 via TOPICAL
  Filled 2023-02-08 (×3): qty 60

## 2023-02-08 MED ORDER — HYDROMORPHONE HCL 1 MG/ML IJ SOLN
1.0000 mg | Freq: Once | INTRAMUSCULAR | Status: AC
  Administered 2023-02-08: 1 mg via INTRAVENOUS

## 2023-02-08 MED ORDER — METRONIDAZOLE 500 MG/100ML IV SOLN
500.0000 mg | Freq: Three times a day (TID) | INTRAVENOUS | Status: DC
Start: 2023-02-08 — End: 2023-02-10
  Administered 2023-02-08 – 2023-02-10 (×6): 500 mg via INTRAVENOUS
  Filled 2023-02-08 (×6): qty 100

## 2023-02-08 MED ORDER — AMIODARONE HCL IN DEXTROSE 360-4.14 MG/200ML-% IV SOLN
30.0000 mg/h | INTRAVENOUS | Status: DC
  Administered 2023-02-08 – 2023-02-10 (×4): 30 mg/h via INTRAVENOUS
  Filled 2023-02-08 (×4): qty 200

## 2023-02-08 MED ORDER — LACTATED RINGERS IV SOLN
150.0000 mL/h | INTRAVENOUS | Status: AC
  Administered 2023-02-08: 150 mL/h via INTRAVENOUS

## 2023-02-08 MED ORDER — HEPARIN SODIUM (PORCINE) 5000 UNIT/ML IJ SOLN
5000.0000 [IU] | Freq: Three times a day (TID) | INTRAMUSCULAR | Status: DC
  Administered 2023-02-08 – 2023-02-09 (×3): 5000 [IU] via SUBCUTANEOUS
  Filled 2023-02-08 (×3): qty 1

## 2023-02-08 MED ORDER — NOREPINEPHRINE 4 MG/250ML-% IV SOLN: 2.0000 ug/min | INTRAVENOUS | Status: DC

## 2023-02-08 MED ORDER — HYDROMORPHONE HCL 1 MG/ML IJ SOLN
0.5000 mg | INTRAMUSCULAR | Status: DC | PRN
  Administered 2023-02-08 (×2): 1 mg via INTRAVENOUS
  Filled 2023-02-08 (×3): qty 1

## 2023-02-08 MED ORDER — METOPROLOL TARTRATE 5 MG/5ML IV SOLN
2.5000 mg | INTRAVENOUS | Status: DC | PRN
Start: 2023-02-08 — End: 2023-02-08
  Administered 2023-02-08: 2.5 mg via INTRAVENOUS

## 2023-02-08 MED ORDER — DOCUSATE SODIUM 100 MG PO CAPS: 100.0000 mg | ORAL_CAPSULE | Freq: Two times a day (BID) | ORAL | Status: DC | PRN

## 2023-02-08 MED ORDER — SODIUM CHLORIDE 0.9 % IV SOLN
2.0000 g | Freq: Three times a day (TID) | INTRAVENOUS | Status: DC
Start: 2023-02-08 — End: 2023-02-14
  Administered 2023-02-08 – 2023-02-14 (×20): 2 g via INTRAVENOUS
  Filled 2023-02-08 (×20): qty 12.5

## 2023-02-08 MED ORDER — CHLORHEXIDINE GLUCONATE CLOTH 2 % EX PADS
6.0000 | MEDICATED_PAD | Freq: Every day | CUTANEOUS | Status: DC
  Administered 2023-02-09 – 2023-03-07 (×27): 6 via TOPICAL

## 2023-02-08 MED ORDER — HYDROMORPHONE HCL 1 MG/ML IJ SOLN
0.5000 mg | INTRAMUSCULAR | Status: DC | PRN
  Administered 2023-02-08 – 2023-02-09 (×3): 1 mg via INTRAVENOUS
  Filled 2023-02-08 (×3): qty 1

## 2023-02-08 MED ORDER — ALBUTEROL SULFATE (2.5 MG/3ML) 0.083% IN NEBU
2.5000 mg | INHALATION_SOLUTION | Freq: Once | RESPIRATORY_TRACT | Status: AC
  Administered 2023-02-08: 2.5 mg via RESPIRATORY_TRACT
  Filled 2023-02-08: qty 3

## 2023-02-08 MED ORDER — ONDANSETRON HCL 4 MG PO TABS: 4.0000 mg | ORAL_TABLET | Freq: Four times a day (QID) | ORAL | Status: DC | PRN

## 2023-02-08 MED ORDER — METOPROLOL TARTRATE 5 MG/5ML IV SOLN
2.5000 mg | Freq: Once | INTRAVENOUS | Status: AC
  Administered 2023-02-08: 2.5 mg via INTRAVENOUS
  Filled 2023-02-08: qty 5

## 2023-02-08 MED ORDER — VANCOMYCIN HCL 750 MG IV SOLR
750.0000 mg | Freq: Two times a day (BID) | INTRAVENOUS | Status: DC
  Administered 2023-02-08 – 2023-02-11 (×7): 750 mg via INTRAVENOUS
  Filled 2023-02-08 (×9): qty 15

## 2023-02-08 MED ORDER — ONDANSETRON HCL 4 MG/2ML IJ SOLN
4.0000 mg | Freq: Four times a day (QID) | INTRAMUSCULAR | Status: DC | PRN
  Administered 2023-02-08 – 2023-03-02 (×2): 4 mg via INTRAVENOUS
  Filled 2023-02-08 (×3): qty 2

## 2023-02-08 MED ORDER — AMIODARONE HCL IN DEXTROSE 360-4.14 MG/200ML-% IV SOLN
60.0000 mg/h | INTRAVENOUS | Status: DC
  Administered 2023-02-08 (×2): 60 mg/h via INTRAVENOUS
  Filled 2023-02-08 (×2): qty 200

## 2023-02-08 MED ORDER — FENTANYL CITRATE PF 50 MCG/ML IJ SOSY
50.0000 ug | PREFILLED_SYRINGE | Freq: Once | INTRAMUSCULAR | Status: AC
  Administered 2023-02-08: 50 ug via INTRAVENOUS
  Filled 2023-02-08: qty 1

## 2023-02-08 MED ORDER — POLYETHYLENE GLYCOL 3350 17 G PO PACK: 17.0000 g | PACK | Freq: Every day | ORAL | Status: DC | PRN

## 2023-02-08 MED ORDER — SODIUM CHLORIDE 0.9 % IV BOLUS
1000.0000 mL | Freq: Once | INTRAVENOUS | Status: AC
  Administered 2023-02-08: 1000 mL via INTRAVENOUS

## 2023-02-08 MED ORDER — PANTOPRAZOLE SODIUM 40 MG PO TBEC
40.0000 mg | DELAYED_RELEASE_TABLET | Freq: Every day | ORAL | Status: DC
  Administered 2023-02-08 – 2023-03-07 (×27): 40 mg via ORAL
  Filled 2023-02-08 (×27): qty 1

## 2023-02-08 MED ORDER — ACETAMINOPHEN 650 MG RE SUPP: 650.0000 mg | Freq: Four times a day (QID) | RECTAL | Status: DC | PRN

## 2023-02-08 MED ORDER — MUPIROCIN 2 % EX OINT
TOPICAL_OINTMENT | Freq: Two times a day (BID) | CUTANEOUS | Status: DC
  Administered 2023-02-09 – 2023-02-28 (×3): 1 via NASAL
  Filled 2023-02-08 (×3): qty 22

## 2023-02-08 MED ORDER — AMIODARONE LOAD VIA INFUSION
150.0000 mg | Freq: Once | INTRAVENOUS | Status: AC
  Administered 2023-02-08: 150 mg via INTRAVENOUS
  Filled 2023-02-08: qty 83.34

## 2023-02-08 MED ORDER — VANCOMYCIN HCL 750 MG/150ML IV SOLN
750.0000 mg | Freq: Two times a day (BID) | INTRAVENOUS | Status: DC
  Filled 2023-02-08: qty 150

## 2023-02-08 MED ORDER — SODIUM CHLORIDE 0.9 % IV SOLN: 250.0000 mL | INTRAVENOUS | Status: AC

## 2023-02-08 MED ORDER — ACETAMINOPHEN 325 MG PO TABS
650.0000 mg | ORAL_TABLET | Freq: Four times a day (QID) | ORAL | Status: DC | PRN
  Administered 2023-02-20 (×3): 650 mg via ORAL
  Administered 2023-03-03: 325 mg via ORAL
  Administered 2023-03-07: 650 mg via ORAL
  Filled 2023-02-08 (×5): qty 2

## 2023-02-08 MED ORDER — METOPROLOL TARTRATE 5 MG/5ML IV SOLN
INTRAVENOUS | Status: AC
  Administered 2023-02-08: 5 mg
  Filled 2023-02-08: qty 5

## 2023-02-08 MED ORDER — ORAL CARE MOUTH RINSE: 15.0000 mL | OROMUCOSAL | Status: DC | PRN

## 2023-02-08 MED ORDER — SODIUM CHLORIDE 0.9% FLUSH
10.0000 mL | Freq: Three times a day (TID) | INTRAVENOUS | Status: DC
Start: 2023-02-08 — End: 2023-03-06
  Administered 2023-02-08 – 2023-02-16 (×25): 10 mL via INTRAPLEURAL
  Administered 2023-02-16: 20 mL via INTRAPLEURAL
  Administered 2023-02-17 – 2023-02-28 (×24): 10 mL via INTRAPLEURAL

## 2023-02-08 NOTE — Consult Note (Signed)
[address]       [place] [address]             [phone]                    Crystian Timbers [facility] Medical Record #[id number] Date of Birth: 04-Feb-1960  Referring: No ref. provider found Primary Care: Patient, No Pcp Per Primary Cardiologist: None  Chief Complaint:    Chief Complaint  Patient presents with   Shortness of Breath    History of Present Illness:    Mathew Wyatt 63 y.o. male admitted from the ER with loculated pleural effusion and sepsis.    History reviewed. No pertinent past medical history.  Past Surgical History:  Procedure Laterality Date   HERNIA REPAIR     MANDIBLE SURGERY      History reviewed. No pertinent family history.   Social History   Tobacco Use  Smoking Status Every Day   Current packs/day: 1.00   Types: Cigarettes  Smokeless Tobacco Never    Social History   Substance and Sexual Activity  Alcohol Use No     No Known Allergies  Current Facility-Administered Medications  Medication Dose Route Frequency Provider Last Rate Last Admin   0.9 %  sodium chloride infusion   Intravenous Continuous Minor, Vilinda Blanks, NP       0.9 %  sodium chloride infusion  250 mL Intravenous Continuous Minor, Vilinda Blanks, NP       acetaminophen (TYLENOL) tablet 650 mg  650 mg Oral Q6H PRN Hillary Bow, DO       Or   acetaminophen (TYLENOL) suppository 650 mg  650 mg Rectal Q6H PRN Hillary Bow, DO       amiodarone (NEXTERONE PREMIX) 360-4.14 MG/200ML-% (1.8 mg/mL) IV infusion  60 mg/hr Intravenous Continuous Adhikari, Amrit, MD       Followed by   amiodarone (NEXTERONE PREMIX) 360-4.14 MG/200ML-% (1.8 mg/mL) IV infusion  30 mg/hr Intravenous Continuous Adhikari, Amrit, MD       ceFEPIme (MAXIPIME) 2 g in sodium chloride 0.9 % 100 mL IVPB  2 g Intravenous Q8H Juliette Mangle, RPH   Stopped at 02/08/23 0520   docusate sodium (COLACE) capsule 100 mg  100 mg Oral BID PRN Minor, Vilinda Blanks, NP       Gerhardt's butt cream    Topical TID Oretha Milch, MD       heparin injection 5,000 Units  5,000 Units Subcutaneous Q8H Minor, Vilinda Blanks, NP       HYDROmorphone (DILAUDID) injection 0.5-1 mg  0.5-1 mg Intravenous Q2H PRN Hillary Bow, DO   1 mg at 02/08/23 6295   HYDROmorphone (DILAUDID) injection 1 mg  1 mg Intravenous Once Oretha Milch, MD       lactated ringers infusion  150 mL/hr Intravenous Continuous Hillary Bow, DO 150 mL/hr at 02/08/23 0757 150 mL/hr at 02/08/23 0757   metoprolol tartrate (LOPRESSOR) 5 MG/5ML injection            metoprolol tartrate (LOPRESSOR) injection 2.5-5 mg  2.5-5 mg Intravenous Q3H PRN Oretha Milch, MD       norepinephrine (LEVOPHED) 4mg  in (0.016 mg/mL) premix infusion  2-10 mcg/min Intravenous Titrated Minor, Vilinda Blanks, NP       ondansetron (ZOFRAN) tablet 4 mg  4 mg Oral Q6H PRN Hillary Bow, DO       Or   ondansetron (  ZOFRAN) injection 4 mg  4 mg Intravenous Q6H PRN Hillary Bow, DO   4 mg at 02/08/23 0604   pantoprazole (PROTONIX) EC tablet 40 mg  40 mg Oral Daily Minor, Vilinda Blanks, NP       polyethylene glycol (MIRALAX / GLYCOLAX) packet 17 g  17 g Oral Daily PRN Minor, Vilinda Blanks, NP       vancomycin (VANCOCIN) 750 mg in sodium chloride 0.9 % 250 mL IVPB  750 mg Intravenous Q12H Juliette Mangle, RPH   Stopped at 02/08/23 1610    Review of Systems  Constitutional:  Positive for diaphoresis, fever and malaise/fatigue.  Respiratory:  Positive for cough and shortness of breath.   Cardiovascular:  Positive for chest pain.    PHYSICAL EXAMINATION: BP 115/74   Pulse (!) 158   Temp 98.1 F (36.7 C) (Oral)   Resp (!) 22   SpO2 96%   Physical Exam Constitutional:      General: He is in acute distress.     Appearance: He is ill-appearing and toxic-appearing.  HENT:     Head: Normocephalic and atraumatic.  Cardiovascular:     Rate and Rhythm: Tachycardia present.  Pulmonary:     Effort: Tachypnea present.  Neurological:     Mental Status: He is  alert.      Diagnostic Studies & Laboratory data:     Recent Radiology Findings:   DG CHEST PORT 1 VIEW Result Date: 02/08/2023 CLINICAL DATA:  [id number] with left chest empyema and shortness of breath. EXAM: PORTABLE CHEST 1 VIEW COMPARISON:  Chest CT yesterday at 6:17 p.m. FINDINGS: 3:27 a.m. Large air fluid collections in the left hemithorax are noted obscuring the left lung from the level of the aortic knob down. The partially aerated left upper lung field demonstrates increased airspace disease concerning for worsening pneumonia. Nodular airspace disease in the right lower lung field was better seen on CT, but appeared consistent with bronchopneumonia. Remainder of the lungs are clear with emphysematous changes. The cardiac size is normal. The mediastinal configuration is normal. Thoracic cage is intact. IMPRESSION: 1. Large air fluid collections in the left hemithorax obscuring the left lung from the level of the aortic knob down. 2. Increased airspace disease in the partially aerated left upper lung field concerning for worsening pneumonia. 3. Nodular airspace disease in the right lower lung field was better seen on CT, but appeared consistent with bronchopneumonia. Electronically Signed   By: Almira Bar M.D.   On: 02/08/2023 06:02   CT CHEST ABDOMEN PELVIS W CONTRAST Result Date: 02/07/2023 CLINICAL DATA:  Shortness of breath, tachycardia, sepsis. Large hydropneumothorax concerning for empyema. Right upper quadrant pain. EXAM: CT CHEST, ABDOMEN, AND PELVIS WITH CONTRAST TECHNIQUE: Multidetector CT imaging of the chest, abdomen and pelvis was performed following the standard protocol during bolus administration of intravenous contrast. RADIATION DOSE REDUCTION: This exam was performed according to the departmental dose-optimization program which includes automated exposure control, adjustment of the mA and/or kV according to patient size and/or use of iterative reconstruction technique. CONTRAST:   OMNIPAQUE IOHEXOL 300 MG/ML  SOLN COMPARISON:  Chest x-ray today FINDINGS: CT CHEST FINDINGS Cardiovascular: Heart is normal size. Aorta is normal caliber. Mediastinum/Nodes: Small scattered mediastinal lymph nodes, none pathologically enlarged. No mediastinal, hilar, or axillary adenopathy. Trachea and esophagus are unremarkable. Thyroid unremarkable. Lungs/Pleura: Mild centrilobular emphysema. Large air and fluid collection noted in the left hemithorax compressing the left lung, measuring 19 x 18 x 10 cm. Separate fluid  collection noted medially along the anterior mediastinum measuring 11 x 6.4 x 5.5 cm. Airspace disease in the adjacent left lung. Clustered nodular airspace disease in the right lower lobe with largest nodule measuring up to 1.4 cm. There is airway thickening in the right lower lobe. No effusion on the right. Musculoskeletal: No acute bony abnormality. CT ABDOMEN PELVIS FINDINGS Hepatobiliary: No focal hepatic abnormality. Gallbladder unremarkable. Pancreas: No focal abnormality or ductal dilatation. Spleen: Small low-density area anteriorly in the spleen, likely small cyst. Normal size. No suspicious abnormality. Adrenals/Urinary Tract: Left adrenal nodule measures 1.2 cm and is stable since 2023 most compatible with adenoma. Right adrenal gland and kidneys unremarkable. No stones or hydronephrosis. Urinary bladder unremarkable. Stomach/Bowel: Stomach, large and small bowel grossly unremarkable. Vascular/Lymphatic: Aortoiliac atherosclerosis. No evidence of aneurysm or adenopathy. Reproductive: No visible focal abnormality. Other: No free fluid or free air. Musculoskeletal: No acute bony abnormality. IMPRESSION: Large air and fluid collections noted in the left chest as measured and described above. It is difficult to determine if these reflect pleural collections and thus hydropneumothorax and empyema or reflect pulmonary collections and abscesses. Compression of the underlying left lung  noted in the left lower lobe with patchy airspace disease adjacent in the left upper lobe. Clustered nodular airspace disease in the right lower lobe with airway thickening. This likely reflects bronchopneumonia. No acute findings in the abdomen or pelvis. Emphysema. Electronically Signed   By: Charlett Nose M.D.   On: 02/07/2023 18:43   DG Chest 2 View Result Date: 02/07/2023 CLINICAL DATA:  Cough. EXAM: CHEST - 2 VIEW COMPARISON:  None Available. FINDINGS: AP projection demonstrates dense opacification of the LEFT floor lobe with complete obscuration of the LEFT heart. There is a long air-fluid level within the LEFT lower lobe measuring 13 cm on lateral projection. Findings consistent with hydropneumothorax. RIGHT lung is clear. IMPRESSION: 1. Large hydropneumothorax in the LEFT lower lobe with long air-fluid level. Findings are concerning for infection or neoplasm of the LEFT lower lobe with accompanying empyema or pulmonary abscess. 2. Recommend CT thorax with contrast for further evaluation. Electronically Signed   By: Genevive Bi M.D.   On: 02/07/2023 16:36       I have independently reviewed the above radiology studies  and reviewed the findings with the patient.   Recent Lab Findings: Lab Results  Component Value Date   WBC 26.5 (H) 02/08/2023   HGB 10.3 (L) 02/08/2023   HCT 31.6 (L) 02/08/2023   PLT 300 02/08/2023   GLUCOSE 153 (H) 02/08/2023   CHOL 131 09/07/2006   TRIG 246 (HH) 09/07/2006   HDL 48.3 09/07/2006   LDLDIRECT 55.3 09/07/2006   ALT 22 02/07/2023   AST 33 02/07/2023   NA 134 (L) 02/08/2023   K 3.8 02/08/2023   CL 100 02/08/2023   CREATININE 0.82 02/08/2023   BUN 19 02/08/2023   CO2 25 02/08/2023   TSH 0.80 09/07/2006   INR 1.3 (H) 02/08/2023       Assessment / Plan:   63yo male with loculated left pleural effusion, concerning for empyema vs pulmonary abscess.  Would recommend chest tube drainage for now, and possible lytic therapy.  If unsuccessful,  will proceed with surgery.        Mathew Wyatt 02/08/2023 10:11 AM

## 2023-02-08 NOTE — H&P (Signed)
History and Physical    Patient: Mathew Wyatt OZH:[id number] DOB: 06/08/1960 DOA: 02/08/2023 DOS: the patient was seen and examined on 02/08/2023 PCP: Patient, No Pcp Per  Patient coming from: Outside Hospital  Chief Complaint:  Chief Complaint  Patient presents with   Shortness of Breath   HPI: [name] is a 63 y.o. male with medical history significant of smoking, COPD.  Pt in to ED at [facility] yesterday with, SOB.  Found to have hydropneumothorax likely representing empyema and PNA.  Pt also septic with WBC 35k, tachycardia, tachypnea.  No CTS available at [facility], plans made to transfer pt to [facility] for VATS.  No beds available at [facility] so pt came over ED to ED transfer.   Review of Systems: As mentioned in the history of present illness. All other systems reviewed and are negative. History reviewed. No pertinent past medical history. Past Surgical History:  Procedure Laterality Date   HERNIA REPAIR     MANDIBLE SURGERY     Social History:  reports that he has been smoking cigarettes. He has never used smokeless tobacco. He reports that he does not drink alcohol and does not use drugs.  No Known Allergies  History reviewed. No pertinent family history.  Prior to Admission medications   Medication Sig Start Date End Date Taking? Authorizing Provider  albuterol (PROVENTIL HFA;VENTOLIN HFA) 108 (90 Base) MCG/ACT inhaler Inhale 2 puffs into the lungs every 6 (six) hours as needed for wheezing or shortness of breath. Patient not taking: Reported on 02/07/2023 12/29/17   Tommi Rumps, PA-C  benzonatate (TESSALON PERLES) 100 MG capsule Take 1 or 2 every 8 hours as needed for cough Patient not taking: Reported on 02/07/2023 12/29/17   Tommi Rumps, PA-C  HYDROcodone-acetaminophen (NORCO) 5-325 MG tablet Take 1 tablet by mouth every 4 (four) hours as needed for moderate pain. Patient not taking: Reported on 02/07/2023 06/09/21   Willy Eddy, MD  polyethylene glycol (MIRALAX /  GLYCOLAX) 17 g packet Take 17 g by mouth daily. Mix one tablespoon with 8oz of your favorite juice or water every day until you are having soft formed stools. Then start taking once daily if you didn't have a stool the day before. Patient not taking: Reported on 02/07/2023 06/09/21   Willy Eddy, MD  predniSONE (DELTASONE) 10 MG tablet Take 1 tablet (10 mg total) by mouth daily. Day 1-2: Take 50 mg  ( 5 pills) Day 3-4 : Take 40 mg (4pills) Day 5-6: Take 30 mg (3 pills) Day 7-8:  Take 20 mg (2 pills) Day 9:  Take 10mg  (1 pill) Patient not taking: Reported on 02/07/2023 06/09/21   Willy Eddy, MD    Physical Exam: Vitals:   02/08/23 0212 02/08/23 0230 02/08/23 0245 02/08/23 0316  BP: (!) 118/57 126/75 136/83 (!) 100/58  Pulse: (!) 117 (!) 117 (!) 115 (!) 113  Resp: (!) 26 (!) 22 (!) 30 15  SpO2: 90% (!) 87% 92% 97%   Constitutional: NAD, calm, comfortable Respiratory: Absent BS on L side, pt with tachypnea and SOB Cardiovascular: Tachycardic Abdomen: no tenderness, no masses palpated. No hepatosplenomegaly. Bowel sounds positive.  Neurologic: CN 2-12 grossly intact. Sensation intact, DTR normal. Strength 5/5 in all 4.  Psychiatric: Normal judgment and insight. Alert and oriented x 3. Normal mood.   Data Reviewed:    Labs on Admission: I have personally reviewed following labs and imaging studies  CBC: Recent Labs  Lab 02/07/23 1528 02/08/23 0238  WBC 35.7* 26.5*  NEUTROABS 32.3* 23.8*  HGB 11.3* 10.3*  HCT 35.3* 31.6*  MCV 93.9 93.8  PLT 396 300   Basic Metabolic Panel: Recent Labs  Lab 02/07/23 1528 02/08/23 0238  NA 134* 134*  K 4.2 3.8  CL 93* 100  CO2 24 25  GLUCOSE 237* 153*  BUN 25* 19  CREATININE 0.92 0.82  CALCIUM 8.9 8.2*   GFR: Estimated Creatinine Clearance: 89.8 mL/min (by C-G formula based on SCr of 0.82 mg/dL). Liver Function Tests: Recent Labs  Lab 02/07/23 1528  AST 33  ALT 22  ALKPHOS 157*  BILITOT 2.1*  PROT 8.0  ALBUMIN 1.8*    Recent Labs  Lab 02/07/23 1528  LIPASE 17   No results for input(s): "AMMONIA" in the last 168 hours. Coagulation Profile: No results for input(s): "INR", "PROTIME" in the last 168 hours. Cardiac Enzymes: No results for input(s): "CKTOTAL", "CKMB", "CKMBINDEX", "TROPONINI" in the last 168 hours. BNP (last 3 results) No results for input(s): "PROBNP" in the last 8760 hours. HbA1C: No results for input(s): "HGBA1C" in the last 72 hours. CBG: No results for input(s): "GLUCAP" in the last 168 hours. Lipid Profile: No results for input(s): "CHOL", "HDL", "LDLCALC", "TRIG", "CHOLHDL", "LDLDIRECT" in the last 72 hours. Thyroid Function Tests: No results for input(s): "TSH", "T4TOTAL", "FREET4", "T3FREE", "THYROIDAB" in the last 72 hours. Anemia Panel: No results for input(s): "VITAMINB12", "FOLATE", "FERRITIN", "TIBC", "IRON", "RETICCTPCT" in the last 72 hours. Urine analysis:    Component Value Date/Time   COLORURINE YELLOW 09/07/2006 1353   APPEARANCEUR Clear 09/07/2006 1353   LABSPEC 1.020 09/07/2006 1353   PHURINE 7.0 09/07/2006 1353   GLUCOSEU NEGATIVE 09/07/2006 1353   BILIRUBINUR NEGATIVE 09/07/2006 1353   KETONESUR NEGATIVE 09/07/2006 1353   UROBILINOGEN 1.0 mg/dL 24/58/0998 3382   NITRITE Negative 09/07/2006 1353   LEUKOCYTESUR Negative 09/07/2006 1353    Radiological Exams on Admission: CT CHEST ABDOMEN PELVIS W CONTRAST Result Date: 02/07/2023 CLINICAL DATA:  Shortness of breath, tachycardia, sepsis. Large hydropneumothorax concerning for empyema. Right upper quadrant pain. EXAM: CT CHEST, ABDOMEN, AND PELVIS WITH CONTRAST TECHNIQUE: Multidetector CT imaging of the chest, abdomen and pelvis was performed following the standard protocol during bolus administration of intravenous contrast. RADIATION DOSE REDUCTION: This exam was performed according to the departmental dose-optimization program which includes automated exposure control, adjustment of the mA and/or kV  according to patient size and/or use of iterative reconstruction technique. CONTRAST:  OMNIPAQUE IOHEXOL 300 MG/ML  SOLN COMPARISON:  Chest x-ray today FINDINGS: CT CHEST FINDINGS Cardiovascular: Heart is normal size. Aorta is normal caliber. Mediastinum/Nodes: Small scattered mediastinal lymph nodes, none pathologically enlarged. No mediastinal, hilar, or axillary adenopathy. Trachea and esophagus are unremarkable. Thyroid unremarkable. Lungs/Pleura: Mild centrilobular emphysema. Large air and fluid collection noted in the left hemithorax compressing the left lung, measuring 19 x 18 x 10 cm. Separate fluid collection noted medially along the anterior mediastinum measuring 11 x 6.4 x 5.5 cm. Airspace disease in the adjacent left lung. Clustered nodular airspace disease in the right lower lobe with largest nodule measuring up to 1.4 cm. There is airway thickening in the right lower lobe. No effusion on the right. Musculoskeletal: No acute bony abnormality. CT ABDOMEN PELVIS FINDINGS Hepatobiliary: No focal hepatic abnormality. Gallbladder unremarkable. Pancreas: No focal abnormality or ductal dilatation. Spleen: Small low-density area anteriorly in the spleen, likely small cyst. Normal size. No suspicious abnormality. Adrenals/Urinary Tract: Left adrenal nodule measures 1.2 cm and is stable since 2023 most compatible with adenoma. Right adrenal  gland and kidneys unremarkable. No stones or hydronephrosis. Urinary bladder unremarkable. Stomach/Bowel: Stomach, large and small bowel grossly unremarkable. Vascular/Lymphatic: Aortoiliac atherosclerosis. No evidence of aneurysm or adenopathy. Reproductive: No visible focal abnormality. Other: No free fluid or free air. Musculoskeletal: No acute bony abnormality. IMPRESSION: Large air and fluid collections noted in the left chest as measured and described above. It is difficult to determine if these reflect pleural collections and thus hydropneumothorax and empyema or  reflect pulmonary collections and abscesses. Compression of the underlying left lung noted in the left lower lobe with patchy airspace disease adjacent in the left upper lobe. Clustered nodular airspace disease in the right lower lobe with airway thickening. This likely reflects bronchopneumonia. No acute findings in the abdomen or pelvis. Emphysema. Electronically Signed   By: Charlett Nose M.D.   On: 02/07/2023 18:43   DG Chest 2 View Result Date: 02/07/2023 CLINICAL DATA:  Cough. EXAM: CHEST - 2 VIEW COMPARISON:  None Available. FINDINGS: AP projection demonstrates dense opacification of the LEFT floor lobe with complete obscuration of the LEFT heart. There is a long air-fluid level within the LEFT lower lobe measuring 13 cm on lateral projection. Findings consistent with hydropneumothorax. RIGHT lung is clear. IMPRESSION: 1. Large hydropneumothorax in the LEFT lower lobe with long air-fluid level. Findings are concerning for infection or neoplasm of the LEFT lower lobe with accompanying empyema or pulmonary abscess. 2. Recommend CT thorax with contrast for further evaluation. Electronically Signed   By: Genevive Bi M.D.   On: 02/07/2023 16:36    EKG: Independently reviewed.   Assessment and Plan: * Sepsis due to pneumonia (HCC) Sepsis pathway Got empiric cefepime, flagyl, vanc at [facility] Continuing cefepime / vanc for the moment Presumably to get surgical cultures with his VATS later today (assuming he doesn't need CT placement first). IVF bolus at [facility], cont 150cc/hr for 20h here Tele monitor WBC trended down slightly since yesterday and lactate has improved as well.  Empyema (HCC) Hydropneumothorax, likely representing empyema with PNA Pt transferred ED to ED from [facility] (due to no beds available at [facility]) for admission and VATS procedure EDP at [facility] spoke with CTS: NPO VATS likely later today No chest tube placed at this time, as long as he remains stable pre-VATS. Due to increased RR,  getting repeat CXR to make sure hydropneumothorax looks unchanged / stable at this point and that he doesn't need a more urgent chest tube while waiting for VATS later today ABX as per sepsis discussion below. DDx includes malignancy, but pt seems pretty septic for this, no definite mass being called on CT today.      Advance Care Planning:   Code Status: Full Code  Consults: EDP d/w CTS here at [facility] as above.  Plan = VATS likely today  Family Communication: No family in room  Severity of Illness: The appropriate patient status for this patient is INPATIENT. Inpatient status is judged to be reasonable and necessary in order to provide the required intensity of service to ensure the patient's safety. The patient's presenting symptoms, physical exam findings, and initial radiographic and laboratory data in the context of their chronic comorbidities is felt to place them at high risk for further clinical deterioration. Furthermore, it is not anticipated that the patient will be medically stable for discharge from the hospital within 2 midnights of admission.   * I certify that at the point of admission it is my clinical judgment that the patient will require inpatient hospital care spanning  beyond 2 midnights from the point of admission due to high intensity of service, high risk for further deterioration and high frequency of surveillance required.*  Author: Hillary Bow., DO 02/08/2023 3:22 AM  For on call review [email or web address].

## 2023-02-08 NOTE — ED Triage Notes (Signed)
Patient BIB [facility] from [facility] w/ complaints of a hydropneumothorax needing surgery.

## 2023-02-08 NOTE — Progress Notes (Signed)
  Echocardiogram 2D Echocardiogram has been performed.  Delcie Roch 02/08/2023, 4:08 PM

## 2023-02-08 NOTE — Consult Note (Addendum)
Cardiology Consultation:   Patient ID: Mathew Wyatt; [id number]; May 10, 1960   Admit date: 02/08/2023 Date of Consult: 02/08/2023  Primary Care Provider: Patient, No Pcp Per Primary Cardiologist: New to [facility]  Patient Profile:   Mathew Wyatt is a 63 y.o. male with a hx of tobacco use and COPD who is being seen today for the evaluation of new onset AF with RVR at the request of Dr. Julian Reil.  History of Present Illness:   Mathew Wyatt is a 63yo M with a hx of ongoing tobacco use and COPD who presented to [facility] 02/07/23 with a 2-week hx of progressive SOB and productive cough. On arrival he was found to be tachycardic and tachypneic. Labs revealed significant leukocytosis with a WBC of 35.7 and lactic acid of 4.4. He was treated with IV fluids and broad spectrum antibiotics. CXR with concern for hydropneumothorax with possible empyema. Subsequent CT chest confirmed a large left sided loculated hydropneumothorax felt to represent empyema or abscess. Plan was for transfer to [facility] for CT surgery consultation and probable VATS later today. Chest tube placement deferred given no hypoxia and stable loculation on repeat CXR.   After transfer to [facility] patient found to have new onset atrial fibrillation with RVR with rates in the 160's on telemetry. Patient with mild decompensation with this and has required 4L [place] to maintain saturations. PCCM was consulted for assistance along with cardiology. Amiodarone infusion was initiated with minimal HR improvement. IV Heparin held in the setting of possible VATS later today.   On exam, the patient is without chest pain, LE edema, dizziness, or syncope. He remains tachypneic and tachycardic. Plan for transfer to 2H07.   History reviewed. No pertinent past medical history.  Past Surgical History:  Procedure Laterality Date   HERNIA REPAIR     MANDIBLE SURGERY       Prior to Admission medications   Not on File   Inpatient Medications: Scheduled Meds:  Continuous  Infusions:  amiodarone     Followed by   amiodarone     ceFEPime (MAXIPIME) IV Stopped (02/08/23 0520)   lactated ringers 150 mL/hr (02/08/23 0757)   sodium chloride     vancomycin (VANCOCIN) 750 mg in sodium chloride 0.9 % 250 mL IVPB Stopped (02/08/23 0631)   PRN Meds: acetaminophen **OR** acetaminophen, HYDROmorphone (DILAUDID) injection, ondansetron **OR** ondansetron (ZOFRAN) IV  Allergies:   No Known Allergies  Social History:   Social History   Socioeconomic History   Marital status: Single    Spouse name: Not on file   Number of children: Not on file   Years of education: Not on file   Highest education level: Not on file  Occupational History   Not on file  Tobacco Use   Smoking status: Every Day    Current packs/day: 1.00    Types: Cigarettes   Smokeless tobacco: Never  Substance and Sexual Activity   Alcohol use: No   Drug use: No   Sexual activity: Not on file  Other Topics Concern   Not on file  Social History Narrative   Not on file   Social Drivers of Health   Financial Resource Strain: Not on file  Food Insecurity: Not on file  Transportation Needs: Not on file  Physical Activity: Not on file  Stress: Not on file  Social Connections: Not on file  Intimate Partner Violence: Not on file    Family History:   History reviewed. No pertinent family history. Family Status:  No  family status information on file.    ROS:  Please see the history of present illness.  All other ROS reviewed and negative.     Physical Exam/Data:   Vitals:   02/08/23 0651 02/08/23 0745 02/08/23 0800 02/08/23 0815  BP:  108/71 (!) 108/92 107/73  Pulse:  (!) 110 (!) 164 89  Resp: (!) 22 (!) 34 (!) 32 (!) 28  Temp:      TempSrc:      SpO2: 94% 92% 90% 92%    Intake/Output Summary (Last 24 hours) at 02/08/2023 0846 Last data filed at 02/08/2023 0631 Gross per 24 hour  Intake 350 ml  Output --  Net 350 ml   There were no vitals filed for this visit. There is  no height or weight on file to calculate BMI.   General: Ill-appearing in NAD Lungs: No wheezing or rales. BS absent on left side and diminished on the right. Breathing is labored. Cardiovascular: Irregularly irregular. No murmurs Abdomen: Soft, non-tender, non-distended. No obvious abdominal masses. Extremities: No edema. Neuro: Alert and oriented. No focal deficits. No facial asymmetry. MAE spontaneously. Psych: Responds to questions appropriately with normal affect.    EKG:  The EKG was personally reviewed and demonstrates: 02/08/23 AF with RVR, HR 170bpm  Telemetry:  Telemetry was personally reviewed and demonstrates:  AF with rates in the 150-170's   Relevant CV Studies:  ECHO: None   Laboratory Data:  Chemistry Recent Labs  Lab 02/07/23 1528 02/08/23 0238  NA 134* 134*  K 4.2 3.8  CL 93* 100  CO2 24 25  GLUCOSE 237* 153*  BUN 25* 19  CREATININE 0.92 0.82  CALCIUM 8.9 8.2*  GFRNONAA >60 >60  ANIONGAP 17* 9    Total Protein  Date Value Ref Range Status  02/07/2023 8.0 6.5 - 8.1 g/dL Final  02/72/5366 8.0 6.4 - 8.2 g/dL Final   Albumin  Date Value Ref Range Status  02/07/2023 1.8 (L) 3.5 - 5.0 g/dL Final  44/03/4740 4.0 3.4 - 5.0 g/dL Final   AST  Date Value Ref Range Status  02/07/2023 33 15 - 41 U/L Final   SGOT(AST)  Date Value Ref Range Status  03/19/2011 20 15 - 37 Unit/L Final   ALT  Date Value Ref Range Status  02/07/2023 22 0 - 44 U/L Final   SGPT (ALT)  Date Value Ref Range Status  03/19/2011 24 U/L Final    Comment:    12-78 NOTE: NEW REFERENCE RANGE 12/11/2010    Alkaline Phosphatase  Date Value Ref Range Status  02/07/2023 157 (H) 38 - 126 U/L Final  03/19/2011 73 50 - 136 Unit/L Final   Total Bilirubin  Date Value Ref Range Status  02/07/2023 2.1 (H) 0.0 - 1.2 mg/dL Final   Bilirubin,Total  Date Value Ref Range Status  03/19/2011 0.4 0.2 - 1.0 mg/dL Final   Hematology Recent Labs  Lab 02/07/23 1528 02/08/23 0238  WBC  35.7* 26.5*  RBC 3.76* 3.37*  HGB 11.3* 10.3*  HCT 35.3* 31.6*  MCV 93.9 93.8  MCH 30.1 30.6  MCHC 32.0 32.6  RDW 15.3 15.2  PLT 396 300   Cardiac EnzymesNo results for input(s): "TROPONINI" in the last 168 hours. No results for input(s): "TROPIPOC" in the last 168 hours.  BNPNo results for input(s): "BNP", "PROBNP" in the last 168 hours.  DDimer No results for input(s): "DDIMER" in the last 168 hours. TSH:  Lab Results  Component Value Date   TSH 0.80 09/07/2006  Lipids: Lab Results  Component Value Date   CHOL 131 09/07/2006   HDL 48.3 09/07/2006   LDLDIRECT 55.3 09/07/2006   TRIG 246 (HH) 09/07/2006   CHOLHDL 2.7 CALC 09/07/2006   ZOXW9U:EA results found for: "HGBA1C"  Radiology/Studies:  DG CHEST PORT 1 VIEW Result Date: 02/08/2023 CLINICAL DATA:  [id number] with left chest empyema and shortness of breath. EXAM: PORTABLE CHEST 1 VIEW COMPARISON:  Chest CT yesterday at 6:17 p.m. FINDINGS: 3:27 a.m. Large air fluid collections in the left hemithorax are noted obscuring the left lung from the level of the aortic knob down. The partially aerated left upper lung field demonstrates increased airspace disease concerning for worsening pneumonia. Nodular airspace disease in the right lower lung field was better seen on CT, but appeared consistent with bronchopneumonia. Remainder of the lungs are clear with emphysematous changes. The cardiac size is normal. The mediastinal configuration is normal. Thoracic cage is intact. IMPRESSION: 1. Large air fluid collections in the left hemithorax obscuring the left lung from the level of the aortic knob down. 2. Increased airspace disease in the partially aerated left upper lung field concerning for worsening pneumonia. 3. Nodular airspace disease in the right lower lung field was better seen on CT, but appeared consistent with bronchopneumonia. Electronically Signed   By: Almira Bar M.D.   On: 02/08/2023 06:02   CT CHEST ABDOMEN PELVIS W  CONTRAST Result Date: 02/07/2023 CLINICAL DATA:  Shortness of breath, tachycardia, sepsis. Large hydropneumothorax concerning for empyema. Right upper quadrant pain. EXAM: CT CHEST, ABDOMEN, AND PELVIS WITH CONTRAST TECHNIQUE: Multidetector CT imaging of the chest, abdomen and pelvis was performed following the standard protocol during bolus administration of intravenous contrast. RADIATION DOSE REDUCTION: This exam was performed according to the departmental dose-optimization program which includes automated exposure control, adjustment of the mA and/or kV according to patient size and/or use of iterative reconstruction technique. CONTRAST:  OMNIPAQUE IOHEXOL 300 MG/ML  SOLN COMPARISON:  Chest x-ray today FINDINGS: CT CHEST FINDINGS Cardiovascular: Heart is normal size. Aorta is normal caliber. Mediastinum/Nodes: Small scattered mediastinal lymph nodes, none pathologically enlarged. No mediastinal, hilar, or axillary adenopathy. Trachea and esophagus are unremarkable. Thyroid unremarkable. Lungs/Pleura: Mild centrilobular emphysema. Large air and fluid collection noted in the left hemithorax compressing the left lung, measuring 19 x 18 x 10 cm. Separate fluid collection noted medially along the anterior mediastinum measuring 11 x 6.4 x 5.5 cm. Airspace disease in the adjacent left lung. Clustered nodular airspace disease in the right lower lobe with largest nodule measuring up to 1.4 cm. There is airway thickening in the right lower lobe. No effusion on the right. Musculoskeletal: No acute bony abnormality. CT ABDOMEN PELVIS FINDINGS Hepatobiliary: No focal hepatic abnormality. Gallbladder unremarkable. Pancreas: No focal abnormality or ductal dilatation. Spleen: Small low-density area anteriorly in the spleen, likely small cyst. Normal size. No suspicious abnormality. Adrenals/Urinary Tract: Left adrenal nodule measures 1.2 cm and is stable since 2023 most compatible with adenoma. Right adrenal gland and  kidneys unremarkable. No stones or hydronephrosis. Urinary bladder unremarkable. Stomach/Bowel: Stomach, large and small bowel grossly unremarkable. Vascular/Lymphatic: Aortoiliac atherosclerosis. No evidence of aneurysm or adenopathy. Reproductive: No visible focal abnormality. Other: No free fluid or free air. Musculoskeletal: No acute bony abnormality. IMPRESSION: Large air and fluid collections noted in the left chest as measured and described above. It is difficult to determine if these reflect pleural collections and thus hydropneumothorax and empyema or reflect pulmonary collections and abscesses. Compression of the underlying left lung noted  in the left lower lobe with patchy airspace disease adjacent in the left upper lobe. Clustered nodular airspace disease in the right lower lobe with airway thickening. This likely reflects bronchopneumonia. No acute findings in the abdomen or pelvis. Emphysema. Electronically Signed   By: Charlett Nose M.D.   On: 02/07/2023 18:43   DG Chest 2 View Result Date: 02/07/2023 CLINICAL DATA:  Cough. EXAM: CHEST - 2 VIEW COMPARISON:  None Available. FINDINGS: AP projection demonstrates dense opacification of the LEFT floor lobe with complete obscuration of the LEFT heart. There is a long air-fluid level within the LEFT lower lobe measuring 13 cm on lateral projection. Findings consistent with hydropneumothorax. RIGHT lung is clear. IMPRESSION: 1. Large hydropneumothorax in the LEFT lower lobe with long air-fluid level. Findings are concerning for infection or neoplasm of the LEFT lower lobe with accompanying empyema or pulmonary abscess. 2. Recommend CT thorax with contrast for further evaluation. Electronically Signed   By: Genevive Bi M.D.   On: 02/07/2023 16:36   Assessment and Plan:   New onset AF with RVR: No prior hx of atrial fibrillation. Patient presented with acute SOB found to have significant leukocytosis and sepsis secondary to loculated  hydropneumothorax felt to represent empyema with PNA on CXR/CT chest. Now with new onset atrial fibrillation with RVR. IV Amiodarone bolus and infusion initiated with poor HR response due to acute infection/sepsis. IV Heparin deferred given presumed VATS later today. For now, would attempt to re-bolus with Amiodarone and keep rate at 60mg /hr. Plan anticoagulation in the post surgery setting once okay by TCTS. Suspect once acute illness begins to clear rates will be more easily controlled.   Acute sepsis/PNA/empyema: As above CXR and CT concerning for empyema with PNA treated with empiric antibiotics per primary team. Plan CT surgery consultation and possible VATS later today with chest tube placement. Suspect AF with RVR driven by acute illness and will be less difficult to treat once acute infection subsides.   Tobacco use with COPD: Ongoing daily tobacco use. Cessation to be discussed during hospital course.   For questions or updates, please contact [facility] Please consult [email or web address] for contact info under Cardiology/STEMI.   SignedGeorgie Chard NP-C [facility] Pager: [phone] 02/08/2023 8:46 AM  Agree with above.  63 yo male with COPD and ongoing tobacco use. No h/o heart disease.  Admitted with AF with RVR in setting of sepsis with massive left hydro/pneumo thorax.   Now s/p emergent left chest tube with > 2L pus out.   Remains groggy from procedure.   In AF with RVRin 120s on IV amio.   Lactic acid 4.4 -> 2,8 WBC 31k  Hstrop 59 -> 50  General:  Ill appearing. Groggy Rhonchorous HEENT: normal Neck: supple. no JVD. Carotids 2+ bilat; no bruits. No lymphadenopathy or thryomegaly appreciated. Cor: Irreg tachy.  Lungs: CT in place + rhonchi on left Abdomen: soft, nontender, nondistended. No hepatosplenomegaly. No bruits or masses. Good bowel sounds. Extremities: no cyanosis, clubbing, rash, edema Neuro: groggy but arousable   He has AF in setting of severe  sepsis/left empyema.   Agree with IV amio for now. No AC with risk of bleeding (and ma T-PA via chest tube)  Check echo.   No evidence of significant myocardial ischemia despite extreme metabolic stress.   CRITICAL CARE Performed by: Arvilla Meres  Total critical care time: 45 minutes  Critical care time was exclusive of separately billable procedures and treating other patients.  Critical care was necessary to  treat or prevent imminent or life-threatening deterioration.  Critical care was time spent personally by me (independent of midlevel providers or residents) on the following activities: development of treatment plan with patient and/or surrogate as well as nursing, discussions with consultants, evaluation of patient's response to treatment, examination of patient, obtaining history from patient or surrogate, ordering and performing treatments and interventions, ordering and review of laboratory studies, ordering and review of radiographic studies, pulse oximetry and re-evaluation of patient's condition.  Arvilla Meres, MD  12:13 PM

## 2023-02-08 NOTE — Consult Note (Signed)
NAME:  Mathew Wyatt, MRN:  [id number], DOB:  Sep 01, 1960, LOS: 0 ADMISSION DATE:  02/08/2023, CONSULTATION DATE: 02/08/2023  REFERRING MD: Add, CHIEF COMPLAINT: Hypotension, atrial fibrillation, large empyema on left with hydropneumothorax  History of Present Illness:  63 year old male smoker he presented with 3 weeks history of general malaise coughing with 10 sputum production and was seen in [facility] 02/07/2023 and was transferred to [facility] for CVTS due to a large hydropneumothorax on the left with suspected empyema.  Originally admitted to the hospitalist service pulmonary critical care was called for atrial fibrillation ventricular response hypotension.  Cardiology has also been called.  He was started on amiodarone drip we increased his fluid resuscitation and admitted him to the intensive care unit.  Hopefully he can be taken to surgery for a VATS of course she has been started on empirical antimicrobial therapy.  Pertinent  Medical History  History reviewed. No pertinent past medical history.   Significant Hospital Events: Including procedures, antibiotic start and stop dates in addition to other pertinent events   CT scan with large empyema hydropneumothorax left  Interim History / Subjective:  Transferred from [facility] with a 3-week history of general malaise  Objective   Blood pressure (!) 146/103, pulse (!) 142, temperature 98.1 F (36.7 C), temperature source Oral, resp. rate (!) 28, SpO2 93%.        Intake/Output Summary (Last 24 hours) at 02/08/2023 0943 Last data filed at 02/08/2023 0631 Gross per 24 hour  Intake 350 ml  Output --  Net 350 ml   There were no vitals filed for this visit.  Examination: General: Ill-appearing male who is not keeping his oxygen on HENT: No JVD lymphadenopathy poor dentition is noted, multiple caries and missing teeth Lungs: Completely diminished on the left side Cardiovascular: Tachycardic atrial  tachycardia currently on amiodarone drip Abdomen: Nontender slight distention Extremities: Warm and dry Neuro: Grossly intact without focal defect GU: Voids  Resolved Hospital Problem list     Assessment & Plan:  Sepsis with left chest empyema as source ribboning to respiratory distress, hypotension and generalized failure to thrive.  63 year old smoker who noticed 3 weeks of increasing shortness of breath with 10 sputum he presented [facility] was transferred to [facility] for cardiovascular thoracic surgery intervention for huge empyema hydropneumothorax. CVS T consult has been placed O2 as needed Empirical antimicrobial therapy Panculture I am not sure if the chest tube will be of any service at this point most likely he needs thoracic surgery to clean out the empyema  Shock atrial febrile ventricular response He was started on amiodarone per primary Fluid resuscitation Vasopressors as needed Again surgical intervention would most likely be the main course of action  History of tobacco abuse Smoking cessation  Best Practice (right click and "Reselect all SmartList Selections" daily)   Diet/type: NPO DVT prophylaxis prophylactic heparin  Pressure ulcer(s): N/A GI prophylaxis: PPI Lines: N/A Foley:  N/A Code Status:  full code Last date of multidisciplinary goals of care discussion [tbd]  Labs   CBC: Recent Labs  Lab 02/07/23 1528 02/08/23 0238  WBC 35.7* 26.5*  NEUTROABS 32.3* 23.8*  HGB 11.3* 10.3*  HCT 35.3* 31.6*  MCV 93.9 93.8  PLT 396 300    Basic Metabolic Panel: Recent Labs  Lab 02/07/23 1528 02/08/23 0238  NA 134* 134*  K 4.2 3.8  CL 93* 100  CO2 24 25  GLUCOSE 237* 153*  BUN 25* 19  CREATININE 0.92  0.82  CALCIUM 8.9 8.2*   GFR: Estimated Creatinine Clearance: 89.8 mL/min (by C-G formula based on SCr of 0.82 mg/dL). Recent Labs  Lab 02/07/23 1528 02/07/23 1731 02/07/23 1952 02/08/23 0112 02/08/23 0238 02/08/23 0457   WBC 35.7*  --   --   --  26.5*  --   LATICACIDVEN  --  4.4* 3.1* 2.3*  --  2.8*    Liver Function Tests: Recent Labs  Lab 02/07/23 1528  AST 33  ALT 22  ALKPHOS 157*  BILITOT 2.1*  PROT 8.0  ALBUMIN 1.8*   Recent Labs  Lab 02/07/23 1528  LIPASE 17   No results for input(s): "AMMONIA" in the last 168 hours.  ABG No results found for: "PHART", "PCO2ART", "PO2ART", "HCO3", "TCO2", "ACIDBASEDEF", "O2SAT"   Coagulation Profile: Recent Labs  Lab 02/08/23 0453  INR 1.3*    Cardiac Enzymes: No results for input(s): "CKTOTAL", "CKMB", "CKMBINDEX", "TROPONINI" in the last 168 hours.  HbA1C: No results found for: "HGBA1C"  CBG: No results for input(s): "GLUCAP" in the last 168 hours.  Review of Systems:   10 point review of system taken, please see HPI for positives and negatives.   Past Medical History:  He,  has no past medical history on file.   Surgical History:   Past Surgical History:  Procedure Laterality Date   HERNIA REPAIR     MANDIBLE SURGERY       Social History:   reports that he has been smoking cigarettes. He has never used smokeless tobacco. He reports that he does not drink alcohol and does not use drugs.   Family History:  His family history is not on file.   Allergies No Known Allergies   Home Medications  Prior to Admission medications   Not on File     Critical care time: 34 min     Brett Canales Dewight Catino ACNP Acute Care Nurse Practitioner [facility] Pul[facility]/Critical Care Please consult Amion 02/08/2023, 9:43 AM

## 2023-02-08 NOTE — Procedures (Signed)
Insertion of Chest Tube Procedure Note  Mathew Wyatt  [id number]  03-May-1960  Date:02/08/23  Time:10:46 AM    Provider Performing: Comer Locket. Antwoin Lackey   Procedure: Pleural Catheter Insertion w/ Imaging Guidance (08657)  Indication(s) Effusion  Consent Risks of the procedure as well as the alternatives and risks of each were explained to the patient and/or caregiver.  Consent for the procedure was obtained and is signed in the bedside chart  Anesthesia Topical only with 1% lidocaine  1 mg dilaudid   Time Out Verified patient identification, verified procedure, site/side was marked, verified correct patient position, special equipment/implants available, medications/allergies/relevant history reviewed, required imaging and test results available.   Sterile Technique Maximal sterile technique including full sterile barrier drape, hand hygiene, sterile gown, sterile gloves, mask, hair covering, sterile ultrasound probe cover (if used).   Procedure Description Ultrasound used to identify appropriate pleural anatomy for placement and overlying skin marked. Area of placement cleaned and draped in sterile fashion.  A 14 French pigtail pleural catheter was placed into the left pleural space using Seldinger technique. Appropriate return of pus was obtained.  The tube was connected to atrium and placed on -20 cm H2O wall suction.   Complications/Tolerance None; patient tolerated the procedure well. Chest X-ray is ordered to verify placement.   EBL Minimal  Specimen(s) fluid   Procedure performed by APP Mathew Wyatt.  I supervised all aspects of the procedure including ultrasound imaging  Mathew Timmins V. Vassie Loll MD

## 2023-02-08 NOTE — Procedures (Signed)
Pleural Fibrinolytic Administration Procedure Note  Mathew Wyatt  [id number]  28-May-1960  Date:02/08/23  Time:1:08 PM   Provider Performing:Taimur Fier W [name]   Procedure: Pleural Fibrinolysis Initial day [phone])  Indication(s) Fibrinolysis of complicated pleural effusion  Consent Risks of the procedure as well as the alternatives and risks of each were explained to the patient and/or caregiver.  Consent for the procedure was obtained.   Anesthesia None   Time Out Verified patient identification, verified procedure, site/side was marked, verified correct patient position, special equipment/implants available, medications/allergies/relevant history reviewed, required imaging and test results available.   Sterile Technique Hand hygiene, gloves   Procedure Description Existing pleural catheter was cleaned and accessed in sterile manner.  10mg  of tPA in 30cc of saline and 5mg  of dornase in 30cc of sterile water were injected into pleural space using existing pleural catheter.  Catheter will be clamped for 1 hour and then placed back to suction.   Complications/Tolerance None; patient tolerated the procedure well.  EBL None   Specimen(s) None    Joneen Roach, AGACNP-BC [facility]  See Amion for personal pager PCCM on call pager [phone] until 7pm. Please call Elink 7p-7a. [phone]  02/08/2023 1:08 PM

## 2023-02-08 NOTE — Assessment & Plan Note (Signed)
Sepsis pathway Got empiric cefepime, flagyl, vanc at [facility] Continuing cefepime / vanc for the moment Presumably to get surgical cultures with his VATS later today (assuming he doesn't need CT placement first). IVF bolus at [facility], cont 150cc/hr for 20h here Tele monitor WBC trended down slightly since yesterday and lactate has improved as well.

## 2023-02-08 NOTE — ED Provider Notes (Signed)
[place] EMERGENCY DEPARTMENT AT [facility] Provider Note   CSN: [id number] Arrival date & time: 02/08/23  0204     History  Chief Complaint  Patient presents with   Shortness of Breath    Mathew Wyatt is a 63 y.o. male.  The history is provided by the patient and medical records.  Shortness of Breath Mathew Wyatt is a 63 y.o. male who presents to the Emergency Department complaining of shortness of breath.  He presents to the emergency department for evaluation of progressive shortness of breath and cough that has been ongoing for 2 weeks.  He was initially seen at [facility].  Workup there is significant for large left-sided hydropneumothorax, elevated lactic acid and WBC.  He was started on broad-spectrum antibiotics for concern for empyema.  Plan was to admit to [facility] with CT surgery consult.  Given the lack of bed availability and patient needing to be evaluated by CT surgery ED to ED transfer was performed to expedite CT surgery evaluation.    Home Medications Prior to Admission medications   Not on File      Allergies    Patient has no known allergies.    Review of Systems   Review of Systems  Respiratory:  Positive for shortness of breath.   All other systems reviewed and are negative.   Physical Exam Updated Vital Signs BP 108/71   Pulse (!) 110   Temp 97.6 F (36.4 C) (Oral)   Resp (!) 34   SpO2 92%  Physical Exam Vitals and nursing note reviewed.  Constitutional:      Appearance: He is well-developed.  HENT:     Head: Normocephalic and atraumatic.  Cardiovascular:     Rate and Rhythm: Regular rhythm. Tachycardia present.     Heart sounds: No murmur heard. Pulmonary:     Comments: Tachypnea, expiratory wheezes in the right lung fields, decreased air movement in the left lung fields Abdominal:     Palpations: Abdomen is soft.     Tenderness: There is no abdominal tenderness. There is no guarding or rebound.   Musculoskeletal:        General: No tenderness.  Skin:    General: Skin is warm and dry.  Neurological:     Mental Status: He is alert and oriented to person, place, and time.  Psychiatric:        Behavior: Behavior normal.     ED Results / Procedures / Treatments   Labs (all labs ordered are listed, but only abnormal results are displayed) Labs Reviewed  BASIC METABOLIC PANEL - Abnormal; Notable for the following components:      Result Value   Sodium 134 (*)    Glucose, Bld 153 (*)    Calcium 8.2 (*)    All other components within normal limits  CBC WITH DIFFERENTIAL/PLATELET - Abnormal; Notable for the following components:   WBC 26.5 (*)    RBC 3.37 (*)    Hemoglobin 10.3 (*)    HCT 31.6 (*)    Neutro Abs 23.8 (*)    Monocytes Absolute 1.1 (*)    Abs Immature Granulocytes 0.36 (*)    All other components within normal limits  CORTISOL-AM, BLOOD - Abnormal; Notable for the following components:   Cortisol - AM 50.1 (*)    All other components within normal limits  PROTIME-INR - Abnormal; Notable for the following components:   Prothrombin Time 16.7 (*)    INR 1.3 (*)  All other components within normal limits  I-STAT CG4 LACTIC ACID, ED - Abnormal; Notable for the following components:   Lactic Acid, Venous 2.8 (*)    All other components within normal limits  HIV ANTIBODY (ROUTINE TESTING W REFLEX)  I-STAT CG4 LACTIC ACID, ED    EKG None  Radiology DG CHEST PORT 1 VIEW Result Date: 02/08/2023 CLINICAL DATA:  [id number] with left chest empyema and shortness of breath. EXAM: PORTABLE CHEST 1 VIEW COMPARISON:  Chest CT yesterday at 6:17 p.m. FINDINGS: 3:27 a.m. Large air fluid collections in the left hemithorax are noted obscuring the left lung from the level of the aortic knob down. The partially aerated left upper lung field demonstrates increased airspace disease concerning for worsening pneumonia. Nodular airspace disease in the right lower lung field was better  seen on CT, but appeared consistent with bronchopneumonia. Remainder of the lungs are clear with emphysematous changes. The cardiac size is normal. The mediastinal configuration is normal. Thoracic cage is intact. IMPRESSION: 1. Large air fluid collections in the left hemithorax obscuring the left lung from the level of the aortic knob down. 2. Increased airspace disease in the partially aerated left upper lung field concerning for worsening pneumonia. 3. Nodular airspace disease in the right lower lung field was better seen on CT, but appeared consistent with bronchopneumonia. Electronically Signed   By: Almira Bar M.D.   On: 02/08/2023 06:02   CT CHEST ABDOMEN PELVIS W CONTRAST Result Date: 02/07/2023 CLINICAL DATA:  Shortness of breath, tachycardia, sepsis. Large hydropneumothorax concerning for empyema. Right upper quadrant pain. EXAM: CT CHEST, ABDOMEN, AND PELVIS WITH CONTRAST TECHNIQUE: Multidetector CT imaging of the chest, abdomen and pelvis was performed following the standard protocol during bolus administration of intravenous contrast. RADIATION DOSE REDUCTION: This exam was performed according to the departmental dose-optimization program which includes automated exposure control, adjustment of the mA and/or kV according to patient size and/or use of iterative reconstruction technique. CONTRAST:  OMNIPAQUE IOHEXOL 300 MG/ML  SOLN COMPARISON:  Chest x-ray today FINDINGS: CT CHEST FINDINGS Cardiovascular: Heart is normal size. Aorta is normal caliber. Mediastinum/Nodes: Small scattered mediastinal lymph nodes, none pathologically enlarged. No mediastinal, hilar, or axillary adenopathy. Trachea and esophagus are unremarkable. Thyroid unremarkable. Lungs/Pleura: Mild centrilobular emphysema. Large air and fluid collection noted in the left hemithorax compressing the left lung, measuring 19 x 18 x 10 cm. Separate fluid collection noted medially along the anterior mediastinum measuring 11 x 6.4 x  5.5 cm. Airspace disease in the adjacent left lung. Clustered nodular airspace disease in the right lower lobe with largest nodule measuring up to 1.4 cm. There is airway thickening in the right lower lobe. No effusion on the right. Musculoskeletal: No acute bony abnormality. CT ABDOMEN PELVIS FINDINGS Hepatobiliary: No focal hepatic abnormality. Gallbladder unremarkable. Pancreas: No focal abnormality or ductal dilatation. Spleen: Small low-density area anteriorly in the spleen, likely small cyst. Normal size. No suspicious abnormality. Adrenals/Urinary Tract: Left adrenal nodule measures 1.2 cm and is stable since 2023 most compatible with adenoma. Right adrenal gland and kidneys unremarkable. No stones or hydronephrosis. Urinary bladder unremarkable. Stomach/Bowel: Stomach, large and small bowel grossly unremarkable. Vascular/Lymphatic: Aortoiliac atherosclerosis. No evidence of aneurysm or adenopathy. Reproductive: No visible focal abnormality. Other: No free fluid or free air. Musculoskeletal: No acute bony abnormality. IMPRESSION: Large air and fluid collections noted in the left chest as measured and described above. It is difficult to determine if these reflect pleural collections and thus hydropneumothorax and empyema or reflect  pulmonary collections and abscesses. Compression of the underlying left lung noted in the left lower lobe with patchy airspace disease adjacent in the left upper lobe. Clustered nodular airspace disease in the right lower lobe with airway thickening. This likely reflects bronchopneumonia. No acute findings in the abdomen or pelvis. Emphysema. Electronically Signed   By: Charlett Nose M.D.   On: 02/07/2023 18:43   DG Chest 2 View Result Date: 02/07/2023 CLINICAL DATA:  Cough. EXAM: CHEST - 2 VIEW COMPARISON:  None Available. FINDINGS: AP projection demonstrates dense opacification of the LEFT floor lobe with complete obscuration of the LEFT heart. There is a long air-fluid level  within the LEFT lower lobe measuring 13 cm on lateral projection. Findings consistent with hydropneumothorax. RIGHT lung is clear. IMPRESSION: 1. Large hydropneumothorax in the LEFT lower lobe with long air-fluid level. Findings are concerning for infection or neoplasm of the LEFT lower lobe with accompanying empyema or pulmonary abscess. 2. Recommend CT thorax with contrast for further evaluation. Electronically Signed   By: Genevive Bi M.D.   On: 02/07/2023 16:36    Procedures Procedures   CRITICAL CARE Performed by: Tilden Fossa   Total critical care time: 35 minutes  Critical care time was exclusive of separately billable procedures and treating other patients.  Critical care was necessary to treat or prevent imminent or life-threatening deterioration.  Critical care was time spent personally by me on the following activities: development of treatment plan with patient and/or surrogate as well as nursing, discussions with consultants, evaluation of patient's response to treatment, examination of patient, obtaining history from patient or surrogate, ordering and performing treatments and interventions, ordering and review of laboratory studies, ordering and review of radiographic studies, pulse oximetry and re-evaluation of patient's condition.  Medications Ordered in ED Medications  lactated ringers infusion (has no administration in time range)  acetaminophen (TYLENOL) tablet 650 mg (has no administration in time range)    Or  acetaminophen (TYLENOL) suppository 650 mg (has no administration in time range)  ondansetron (ZOFRAN) tablet 4 mg ( Oral See Alternative 02/08/23 0604)    Or  ondansetron (ZOFRAN) injection 4 mg (4 mg Intravenous Given 02/08/23 0604)  ceFEPIme (MAXIPIME) 2 g in sodium chloride 0.9 % 100 mL IVPB (0 g Intravenous Stopped 02/08/23 0520)  vancomycin (VANCOCIN) 750 mg in sodium chloride 0.9 % 250 mL IVPB (0 mg Intravenous Stopped 02/08/23 0631)  HYDROmorphone  (DILAUDID) injection 0.5-1 mg (1 mg Intravenous Given 02/08/23 0604)  fentaNYL (SUBLIMAZE) injection 50 mcg (50 mcg Intravenous Given 02/08/23 0234)  albuterol (PROVENTIL) (2.5 MG/3ML) 0.083% nebulizer solution 2.5 mg (2.5 mg Nebulization Given 02/08/23 0316)  metoprolol tartrate (LOPRESSOR) injection 2.5 mg (2.5 mg Intravenous Given 02/08/23 0700)    ED Course/ Medical Decision Making/ A&P                                 Medical Decision Making Amount and/or Complexity of Data Reviewed Labs: ordered.  Risk Prescription drug management. Decision regarding hospitalization.   Patient transferred from [facility] for evaluation by CT surgery for empyema, sepsis.  Patient with decreased air movement bilaterally, occasional wheezes in the right lung fields.  He was treated with a nebulizer with improvement in his respiratory status.  Discussed with Dr. Carmelia Roller with CT surgery-he will be seen by the service in the morning for potential operative intervention.  Patient received antibiotics prior to transfer.  Discussed with  patient that he may need a thoracostomy tube if his respiratory status worsens.  Patient prefers the procedure to be performed in the operating room if at all possible.  He did have improvement in his respiratory status after nebulizer.  Will continue on supplemental oxygen. Hospitalist consulted for admission for ongoing care. While awaiting patient's inpatient bed he developed A-fib with RVR, resting comfortably in the room.  Will trial a dose of metoprolol.  Updated the admitting team of change in patient's status.  Patient will require ongoing reassessments.          Final Clinical Impression(s) / ED Diagnoses Final diagnoses:  Empyema (HCC)  Atrial fibrillation with rapid ventricular response [facility])    Rx / DC Orders ED Discharge Orders     None         Tilden Fossa, MD 02/08/23 [phone]

## 2023-02-08 NOTE — Assessment & Plan Note (Addendum)
Hydropneumothorax.  Left lower lobe pneumonia.  Patient had chest tube placement x2  Second chest tube placed on 02/10/23  Pleural fluid culture positive for Campylobacter and Peptostreptococcus.   Sp fibronolytic therapy for 7 days, with no major improvement.  CT surgery was consulted. 02/05 underwent VATS with decortication.   Chest tube drainage over the last 24 hrs 50 ml.  Chest tube to water seal, continue to have air leak with coughing.   Antibiotic therapy with Augmentin and azithromycin.  Chest tube to water seal.   Continue incentive spirometer and out of bed.   Patient had pneumonia with sepsis during this hospitalization, now sepsis has resolved.

## 2023-02-08 NOTE — ED Notes (Addendum)
Writer notified that patient's HR in 160s. Writer went to evaluate patient. Pt reports improved pain and mild SOB. Dr. Madilyn Hook notified. Dr. Madilyn Hook paged admitting physician. Repeat EKG performed.

## 2023-02-08 NOTE — ED Notes (Signed)
Admitting MD at bedside assessing pt. Awaiting new orders.

## 2023-02-08 NOTE — Progress Notes (Signed)
Pharmacy Antibiotic Note  Mathew Wyatt is a 63 y.o. male admitted on 02/08/2023 with pneumonia.  Pharmacy has been consulted for vancomycin and cefepime dosing.  Plan: Vancomycin 750mg  IV Q12H. Goal AUC 400-550.  Expected AUC 425. Cefepime 2g IV Q8H.  Temp (24hrs), Avg:98.4 F (36.9 C), Min:97.7 F (36.5 C), Max:98.7 F (37.1 C)  Recent Labs  Lab 02/07/23 1528 02/07/23 1731 02/07/23 1952 02/08/23 0112 02/08/23 0238  WBC 35.7*  --   --   --  26.5*  CREATININE 0.92  --   --   --  0.82  LATICACIDVEN  --  4.4* 3.1* 2.3*  --     Estimated Creatinine Clearance: 89.8 mL/min (by C-G formula based on SCr of 0.82 mg/dL).    No Known Allergies  Thank you for allowing pharmacy to be a part of this patient's care.  Vernard Gambles, PharmD, BCPS  02/08/2023 3:25 AM

## 2023-02-08 NOTE — ED Notes (Signed)
Pt asked for nausea medication, let RN know.

## 2023-02-08 NOTE — ED Notes (Signed)
Admitting paged again for HR 187, awaiting response.

## 2023-02-08 NOTE — ED Notes (Signed)
..  EMTALA: REQUIRED DOCUMENTATION COMPLETED AND REVIEWED BY WRITER PRIOR TO PT TRANSFER MD REASSESSMENT EMTALA RN SECTION TRANSFER E-SIGN VS WITHIN REQUIRED TIME

## 2023-02-08 NOTE — TOC Initial Note (Signed)
Transition of Care [facility]) - Initial/Assessment Note    Patient Details  Name: Mathew Wyatt MRN: [id number] Date of Birth: 12/19/1960  [facility] CM/SW Contact:    Nicanor Bake Phone Number: [phone] 02/08/2023, 4:14 PM  Clinical Narrative:  3:14PM- HF CSW attempted to meet with pt at bedside. Pt was being seeing by ultrasound. CSW will follow up with pt at a more appropriate time.  TOC will continue following.                      Patient Goals and CMS Choice            Expected Discharge Plan and Services                                              Prior Living Arrangements/Services                       Activities of Daily Living      Permission Sought/Granted                  Emotional Assessment              Admission diagnosis:  Sepsis associated hypotension (HCC) [A41.9, I95.9] Atrial fibrillation with rapid ventricular response (HCC) [I48.91] Empyema (HCC) [J86.9] Patient Active Problem List   Diagnosis Date Noted   Empyema (HCC) 02/08/2023   Sepsis due to pneumonia (HCC) 02/08/2023   Sepsis associated hypotension (HCC) 02/08/2023   Migraine without aura 11/08/2006   LOW BACK PAIN 11/08/2006   PCP:  Patient, No Pcp Per Pharmacy:   [facility] [address], [place] - [address] [place] [address] Phone: [phone] Fax: [phone]     Social Drivers of Health (SDOH) Social History: SDOH Screenings   Tobacco Use: High Risk (02/08/2023)   SDOH Interventions:     Readmission Risk Interventions     No data to display

## 2023-02-08 NOTE — ED Notes (Signed)
Pt refusing to change into hospital gown, stating, "I feel like I'm gonna die. It won't matter what I'm wearing for that."

## 2023-02-08 NOTE — Progress Notes (Signed)
Brief same day note:  Patient is a 63 year old male with history of prior tobacco use, COPD who presented to [facility] ED with complaint of shortness of breath.  Chest imaging showed hydropneumothorax with suspicion of empyema, pneumonia.  Patient was septic on presentation with leukocytosis, tachycardia, tachypnea.  Patient transferred to [facility] for VATS after discussing with CT surgery. Went into A-fib with RVR with hypotension here in the ED.  PCCM, cardiology consulted.  Patient seen and examined at bedside in the emergency department.  During evaluation, he was hypotensive, heart rate in the range of 160s, rhythm was A-fib.  Blood pressure soft.  Patient remains alert and oriented.  On 4 L of oxygen per minute.   Assessment and plan:  Severe sepsis: Secondary to empyema.  Presented with shortness of breath,, hypotension, tachypnea, elevated lactate.  Continue broad spectrum antibiotics with cefepime, vancomycin, Flagyl.  Follow-up cultures.  CT surgery consulted. Due to the severity of problem and other comorbidities,we will  also consult PCCM. Has severe leukocytosis.  Hydropneumothorax/acute hypoxic respiratory failure: Chest x-ray showed Large hydropneumothorax in the LEFT lower lobe with long air-fluid level. Findings were concerning for infection or neoplasm of the LEFT lower lobe with accompanying empyema or pulmonary abscess.CT showed large air and fluid collections noted in the left chest compression of the underlying left lung noted in the left lower lobe with patchy airspace disease adjacent in the left upper lobe. Clustered nodular airspace disease in the right lower lobe with airway thickening. CT surgery already consulted.  Most likely this patient needs ICU care.  Will consult PCCM. On 4 L of oxygen currently.  New onset A-fib with RVR: Likely from underlying respiratory issue.  Continue monitoring on telemetry.  Blood pressure soft.  Started on amiodarone drip.  Cardiology will  be consulted  History of COPD/prior tobacco use: Not on oxygen at home.

## 2023-02-09 ENCOUNTER — Inpatient Hospital Stay (HOSPITAL_COMMUNITY): Payer: Medicaid Other

## 2023-02-09 LAB — TRIGLYCERIDES, BODY FLUIDS: Triglycerides, Fluid: 207 mg/dL

## 2023-02-09 LAB — HEPATIC FUNCTION PANEL
ALT: 26 U/L (ref 0–44)
AST: 44 U/L — ABNORMAL HIGH (ref 15–41)
Albumin: <1.5 g/dL — ABNORMAL LOW (ref 3.5–5.0)
Alkaline Phosphatase: 117 U/L (ref 38–126)
Bilirubin, Direct: 0.6 mg/dL — ABNORMAL HIGH (ref 0.0–0.2)
Indirect Bilirubin: 0.6 mg/dL (ref 0.3–0.9)
Total Bilirubin: 1.2 mg/dL (ref 0.0–1.2)
Total Protein: 5.6 g/dL — ABNORMAL LOW (ref 6.5–8.1)

## 2023-02-09 LAB — BASIC METABOLIC PANEL
Anion gap: 5 (ref 5–15)
BUN: 18 mg/dL (ref 8–23)
CO2: 25 mmol/L (ref 22–32)
Calcium: 7.9 mg/dL — ABNORMAL LOW (ref 8.9–10.3)
Chloride: 108 mmol/L (ref 98–111)
Creatinine, Ser: 0.58 mg/dL — ABNORMAL LOW (ref 0.61–1.24)
GFR, Estimated: >60 mL/min (ref >60)
Glucose, Bld: 135 mg/dL — ABNORMAL HIGH (ref 70–99)
Potassium: 2.9 mmol/L — ABNORMAL LOW (ref 3.5–5.1)
Sodium: 138 mmol/L (ref 135–145)

## 2023-02-09 LAB — CBC
HCT: 32 % — ABNORMAL LOW (ref 39.0–52.0)
Hemoglobin: 10.2 g/dL — ABNORMAL LOW (ref 13.0–17.0)
MCH: 29.8 pg (ref 26.0–34.0)
MCHC: 31.9 g/dL (ref 30.0–36.0)
MCV: 93.6 fL (ref 80.0–100.0)
Platelets: 268 10*3/uL (ref 150–400)
RBC: 3.42 MIL/uL — ABNORMAL LOW (ref 4.22–5.81)
RDW: 15.5 % (ref 11.5–15.5)
WBC: 18.5 10*3/uL — ABNORMAL HIGH (ref 4.0–10.5)
nRBC: 0 % (ref 0.0–0.2)

## 2023-02-09 LAB — MAGNESIUM: Magnesium: 1.9 mg/dL (ref 1.7–2.4)

## 2023-02-09 LAB — PATHOLOGIST SMEAR REVIEW

## 2023-02-09 LAB — PHOSPHORUS: Phosphorus: 2.5 mg/dL (ref 2.5–4.6)

## 2023-02-09 MED ORDER — METHOCARBAMOL 1000 MG/10ML IJ SOLN: 500.0000 mg | Freq: Three times a day (TID) | INTRAMUSCULAR | Status: DC

## 2023-02-09 MED ORDER — HEPARIN SODIUM (PORCINE) 5000 UNIT/ML IJ SOLN
5000.0000 [IU] | Freq: Three times a day (TID) | INTRAMUSCULAR | Status: DC
  Administered 2023-02-11: 5000 [IU] via SUBCUTANEOUS
  Filled 2023-02-09: qty 1

## 2023-02-09 MED ORDER — METHOCARBAMOL 1000 MG/10ML IJ SOLN
500.0000 mg | Freq: Three times a day (TID) | INTRAMUSCULAR | Status: DC
  Administered 2023-02-09 – 2023-02-27 (×4): 500 mg via INTRAVENOUS
  Filled 2023-02-09 (×5): qty 10

## 2023-02-09 MED ORDER — POTASSIUM CHLORIDE 10 MEQ/100ML IV SOLN
10.0000 meq | INTRAVENOUS | Status: AC
  Administered 2023-02-09 (×4): 10 meq via INTRAVENOUS
  Filled 2023-02-09 (×4): qty 100

## 2023-02-09 MED ORDER — OXYCODONE HCL 5 MG PO TABS
5.0000 mg | ORAL_TABLET | Freq: Four times a day (QID) | ORAL | Status: DC
  Administered 2023-02-09 – 2023-02-12 (×13): 5 mg via ORAL
  Filled 2023-02-09 (×13): qty 1

## 2023-02-09 MED ORDER — K PHOS MONO-SOD PHOS DI & MONO 155-852-130 MG PO TABS
500.0000 mg | ORAL_TABLET | Freq: Once | ORAL | Status: AC
  Administered 2023-02-09: 500 mg via ORAL
  Filled 2023-02-09: qty 2

## 2023-02-09 MED ORDER — POLYETHYLENE GLYCOL 3350 17 G PO PACK
17.0000 g | PACK | Freq: Every day | ORAL | Status: DC
  Administered 2023-02-11 – 2023-02-18 (×4): 17 g via ORAL
  Filled 2023-02-09 (×9): qty 1

## 2023-02-09 MED ORDER — MAGNESIUM SULFATE 2 GM/50ML IV SOLN
2.0000 g | Freq: Once | INTRAVENOUS | Status: AC
  Administered 2023-02-09: 2 g via INTRAVENOUS
  Filled 2023-02-09: qty 50

## 2023-02-09 MED ORDER — MELATONIN 5 MG PO TABS
5.0000 mg | ORAL_TABLET | Freq: Every evening | ORAL | Status: DC | PRN
  Administered 2023-02-09 – 2023-02-10 (×2): 5 mg via ORAL
  Filled 2023-02-09 (×2): qty 1

## 2023-02-09 MED ORDER — POTASSIUM CHLORIDE CRYS ER 20 MEQ PO TBCR
40.0000 meq | EXTENDED_RELEASE_TABLET | Freq: Once | ORAL | Status: AC
  Administered 2023-02-09: 40 meq via ORAL
  Filled 2023-02-09: qty 2

## 2023-02-09 MED ORDER — HYDROMORPHONE HCL 1 MG/ML IJ SOLN
1.0000 mg | INTRAMUSCULAR | Status: DC | PRN
  Administered 2023-02-09 – 2023-02-12 (×13): 2 mg via INTRAVENOUS
  Filled 2023-02-09 (×13): qty 2

## 2023-02-09 MED ORDER — METHOCARBAMOL 500 MG PO TABS
500.0000 mg | ORAL_TABLET | Freq: Three times a day (TID) | ORAL | Status: DC
  Administered 2023-02-09 – 2023-03-07 (×62): 500 mg via ORAL
  Filled 2023-02-09 (×66): qty 1

## 2023-02-09 NOTE — Progress Notes (Signed)
NAME:  Mathew Wyatt, MRN:  [id number], DOB:  Jun 05, 1960, LOS: 1 ADMISSION DATE:  02/08/2023, CONSULTATION DATE: 02/08/2023  REFERRING MD: Add, CHIEF COMPLAINT: Hypotension, atrial fibrillation, large empyema on left with hydropneumothorax  History of Present Illness:  63 year old male with past medical history of tobacco use, COPD who presented to [facility] on 1/20 with cough and shortness of breath. Reportedly having worsening symptoms over the preceding 2 weeks. He denied having fever, chills, chest pain, n/v/d. He was found to be tachypneic with significant leukocytosis to 35.7, lactifc 4.4. he was started on IV antibiotics. He had CT chest which demonstrated a large left-sided loculated hydropneumothorax concerning for empyema or abscess. He was transferred to [facility] on 1/21 initially to hospitalist for CT surgery consult and possible VATS. On 1/21 he developed atrial fibrillation with RVR and hypotension and was transferred to [facility] and admitted to ICU.   Pertinent  Medical History  History reviewed. No pertinent past medical history.   Significant Hospital Events: Including procedures, antibiotic start and stop dates in addition to other pertinent events   1/20: [facility] ED with SOB. CT with large left hydropneumo c/w empyema vs abscess.  1/21: TCTS defer VATS. ICU for afib rvr hypotension, started on amiodarone. CT placed with immediate 2L purulent output. Initial tube lytics with additional 300cc output.  1/22: repeat CT chest today to eval for need of additional CT vs more lytics vs observation.   Interim History / Subjective:  NAEON. Complaining of pain and not resting well. Breathing improved.   Objective   Blood pressure (!) 113/100, pulse 87, temperature 98.2 F (36.8 C), temperature source Oral, resp. rate (!) 23, weight 80.1 kg, SpO2 94%.        Intake/Output Summary (Last 24 hours) at 02/09/2023 0754 Last data filed at 02/09/2023 2355 Gross per 24 hour  Intake 3218.53 ml  Output  3405 ml  Net -186.47 ml   Filed Weights   02/09/23 0600  Weight: 80.1 kg    Examination: General: acute on chronically ill appearing male, laying in bed, no acute distress  HENT: [place]/AT, anicteric sclera, perrla, mmm, nasal cannula 3L Lungs: rhonchi and diminished L, clear right, 3L [place], no resp distress  Cardiovascular: s1s2, SR, no JVD or peripheral edema  Abdomen: flat, soft, non-tender Extremities: Warm and dry Neuro: alert and oriented, non focal exam GU: no foley   Resolved Hospital Problem list    Assessment & Plan:  Sepsis 2/2 left empyema  Acute hypoxic respiratory failure 2/2 empyema  Clear respiratory source. Based on recent 2 week history of cough, sob, may have started as pna and developed empyema. Denies alcohol use or chronic dysphagia. Significant leukocytosis to 35. Lactic 4.4. CT chest with large left sided hydropneumothorax consistent with empyema. 1/21 CT placed with 2L out. Improving wbc. Echo without vegetation.  - repeat CT chest 1/22 to eval for 2nd chest tube vs lytics vs observation  - con't broad antibiotics with vanc/cefepime/flagyl  - f/u pleural fluid studies until speciation  - supplemental O2 to maintain SpO2 >90% - increasing pain control. Add scheduled oxy and robaxin, increase dilaudid, prn toradol   Atrial fibrillation with RVR, new  Likely related to acute illness. No previous history. Not anticoagulated PTA. - con't amio gtt (now in NSR) - not convinced he needs ongoing anticoagulation if continues to be in sinus rhythm  - cardiology following, appreciate rec - tele monitoring  History of tobacco abuse - Smoking cessation  Best Practice (right click and "  Reselect all SmartList Selections" daily)   Diet/type: Regular consistency (see orders) DVT prophylaxis prophylactic heparin  Pressure ulcer(s): N/A GI prophylaxis: PPI Lines: N/A Foley:  N/A Code Status:  full code Last date of multidisciplinary goals of care discussion [patient  updated on plan of care at bedside]  Labs   CBC: Recent Labs  Lab 02/07/23 1528 02/08/23 0238 02/08/23 0946 02/09/23 0318  WBC 35.7* 26.5* 31.0* 18.5*  NEUTROABS 32.3* 23.8*  --   --   HGB 11.3* 10.3* 9.8* 10.2*  HCT 35.3* 31.6* 30.7* 32.0*  MCV 93.9 93.8 94.8 93.6  PLT 396 300 336 268    Basic Metabolic Panel: Recent Labs  Lab 02/07/23 1528 02/08/23 0238 02/08/23 0946 02/09/23 0318  NA 134* 134*  --  138  K 4.2 3.8  --  2.9*  CL 93* 100  --  108  CO2 24 25  --  25  GLUCOSE 237* 153*  --  135*  BUN 25* 19  --  18  CREATININE 0.92 0.82 0.72 0.58*  CALCIUM 8.9 8.2*  --  7.9*  MG  --   --   --  1.9  PHOS  --   --   --  2.5   GFR: Estimated Creatinine Clearance: 105.1 mL/min (A) (by C-G formula based on SCr of 0.58 mg/dL (L)). Recent Labs  Lab 02/07/23 1528 02/07/23 1731 02/07/23 1952 02/08/23 0112 02/08/23 0238 02/08/23 0457 02/08/23 0946 02/09/23 0318  PROCALCITON  --   --   --   --   --   --  0.87  --   WBC 35.7*  --   --   --  26.5*  --  31.0* 18.5*  LATICACIDVEN  --  4.4* 3.1* 2.3*  --  2.8*  --   --     Liver Function Tests: Recent Labs  Lab 02/07/23 1528  AST 33  ALT 22  ALKPHOS 157*  BILITOT 2.1*  PROT 8.0  ALBUMIN 1.8*   Recent Labs  Lab 02/07/23 1528  LIPASE 17   No results for input(s): "AMMONIA" in the last 168 hours.  ABG No results found for: "PHART", "PCO2ART", "PO2ART", "HCO3", "TCO2", "ACIDBASEDEF", "O2SAT"   Coagulation Profile: Recent Labs  Lab 02/08/23 0453 02/08/23 0946  INR 1.3* 1.6*    Cardiac Enzymes: No results for input(s): "CKTOTAL", "CKMB", "CKMBINDEX", "TROPONINI" in the last 168 hours.  HbA1C: No results found for: "HGBA1C"  CBG: No results for input(s): "GLUCAP" in the last 168 hours.  Review of Systems:   As above Past Medical History:  He,  has no past medical history on file.   Surgical History:   Past Surgical History:  Procedure Laterality Date   HERNIA REPAIR     MANDIBLE SURGERY        Social History:   reports that he has been smoking cigarettes. He has never used smokeless tobacco. He reports that he does not drink alcohol and does not use drugs.   Family History:  His family history is not on file.   Allergies No Known Allergies   Home Medications  Prior to Admission medications   Not on File     Critical care time: 35 min    Cristopher Peru, PA-C  Pulmonary & Critical Care 02/09/23 10:36 AM  Please see [email or web address] for pager details.  From 7A-7P if no response, please call [phone] After hours, please call ELink [phone]

## 2023-02-09 NOTE — Progress Notes (Addendum)
TCTS DAILY ICU PROGRESS NOTE                   [address]            Mathew Wyatt [address]          [phone]       Total Length of Stay:  LOS: 1 day   Subjective: Patient with complaints of pain left chest area (has had since admission).  Objective: Vital signs in last 24 hours: Temp:  [97.7 F (36.5 C)-98.7 F (37.1 C)] 98.2 F (36.8 C) (01/22 0446) Pulse Rate:  [73-164] 77 (01/22 0600) Cardiac Rhythm: Normal sinus rhythm (01/21 2100) Resp:  [13-45] 17 (01/22 0600) BP: (95-146)/(53-103) 115/58 (01/22 0600) SpO2:  [89 %-98 %] 94 % (01/22 0600) Weight:  [80.1 kg] 80.1 kg (01/22 0600)  Filed Weights   02/09/23 0600  Weight: 80.1 kg    Intake/Output from previous day: 01/21 0701 - 01/22 0700 In: 3218.5 [P.O.:222; I.V.:2276.4; IV Piggyback:650.1] Out: 3405 [Urine:725; Chest Tube:2680]  Intake/Output this shift: No intake/output data recorded.  Current Meds: Scheduled Meds:  Chlorhexidine Gluconate Cloth  6 each Topical Q0600   Gerhardt's butt cream   Topical TID   heparin  5,000 Units Subcutaneous Q8H   mupirocin ointment   Nasal BID   pantoprazole  40 mg Oral Daily   sodium chloride flush  10 mL Intrapleural Q8H   Continuous Infusions:  sodium chloride 100 mL/hr at 02/09/23 0400   sodium chloride     amiodarone 30 mg/hr (02/09/23 0400)   ceFEPime (MAXIPIME) IV 2 g (02/09/23 0443)   magnesium sulfate bolus IVPB 2 g (02/09/23 2956)   metronidazole Stopped (02/09/23 0137)   norepinephrine (LEVOPHED) Adult infusion     potassium chloride 10 mEq (02/09/23 0624)   vancomycin (VANCOCIN) 750 mg in sodium chloride 0.9 % 250 mL IVPB Stopped (02/08/23 2257)   PRN Meds:.acetaminophen **OR** acetaminophen, docusate sodium, HYDROmorphone (DILAUDID) injection, ketorolac, metoprolol tartrate, ondansetron **OR** ondansetron (ZOFRAN) IV, mouth rinse, polyethylene glycol  General appearance: alert, cooperative, and no distress Neurologic: intact Heart:  RRR Lungs: Diminished left basilar breath sounds Extremities: SCDs in place Wound: Dressing clean, dry Left chest tube: to suction  Lab Results: CBC: Recent Labs    02/08/23 0946 02/09/23 0318  WBC 31.0* 18.5*  HGB 9.8* 10.2*  HCT 30.7* 32.0*  PLT 336 268   BMET:  Recent Labs    02/08/23 0238 02/08/23 0946 02/09/23 0318  NA 134*  --  138  K 3.8  --  2.9*  CL 100  --  108  CO2 25  --  25  GLUCOSE 153*  --  135*  BUN 19  --  18  CREATININE 0.82 0.72 0.58*  CALCIUM 8.2*  --  7.9*    CMET: Lab Results  Component Value Date   WBC 18.5 (H) 02/09/2023   HGB 10.2 (L) 02/09/2023   HCT 32.0 (L) 02/09/2023   PLT 268 02/09/2023   GLUCOSE 135 (H) 02/09/2023   CHOL 131 09/07/2006   TRIG 246 (HH) 09/07/2006   HDL 48.3 09/07/2006   LDLDIRECT 55.3 09/07/2006   ALT 22 02/07/2023   AST 33 02/07/2023   NA 138 02/09/2023   K 2.9 (L) 02/09/2023   CL 108 02/09/2023   CREATININE 0.58 (L) 02/09/2023   BUN 18 02/09/2023   CO2 25 02/09/2023   TSH 0.80 09/07/2006   PSA 0.58 09/07/2006   INR 1.6 (H) 02/08/2023  PT/INR:  Recent Labs    02/08/23 0946  LABPROT 19.6*  INR 1.6*   Radiology: ECHOCARDIOGRAM COMPLETE Result Date: 02/08/2023    ECHOCARDIOGRAM REPORT   Patient Name:   Mathew Wyatt Date of Exam: 02/08/2023 Medical Rec #:  [id number]    Height:       72.0 in Accession #:    [id number]   Weight:       149.9 lb Date of Birth:  September 07, 1960     BSA:          1.885 m Patient Age:    62 years     BP:           106/69 mmHg Patient Gender: M            HR:           75 bpm. Exam Location:  Inpatient Procedure: 2D Echo, Color Doppler and Cardiac Doppler Indications:    atrial fibrillation  History:        Patient has no prior history of Echocardiogram examinations.                 COPD and Sepsis; Risk Factors:Current Smoker.  Sonographer:    Delcie Roch RDCS Referring Phys: [id number] DANIEL R BENSIMHON IMPRESSIONS  1. Left ventricular ejection fraction, by estimation, is 45 to  50%. The left ventricle has mildly decreased function. The left ventricle demonstrates global hypokinesis. Left ventricular diastolic parameters were normal. The average left ventricular global longitudinal strain is -12.9 %. The global longitudinal strain is abnormal.  2. Right ventricular systolic function is normal. The right ventricular size is mildly enlarged. Tricuspid regurgitation signal is inadequate for assessing PA pressure.  3. There is a small pericardial effusion, mostly around the LV apex and lateral wall, in the immediate vicinity of an area of complex loculated small left pleural effusion. There appears to be exaggerated respiratory displacement of the ventricular septum, suggesting ventricular interdependence. Consider a component of constrictive physiology. a small pericardial effusion is present. There is no evidence of cardiac tamponade.  4. The mitral valve is normal in structure. Trivial mitral valve regurgitation. No evidence of mitral stenosis.  5. The aortic valve is tricuspid. Aortic valve regurgitation is not visualized. No aortic stenosis is present.  6. The inferior vena cava is dilated in size with >50% respiratory variability, suggesting right atrial pressure of 8 mmHg. FINDINGS  Left Ventricle: Left ventricular ejection fraction, by estimation, is 45 to 50%. The left ventricle has mildly decreased function. The left ventricle demonstrates global hypokinesis. The average left ventricular global longitudinal strain is -12.9 %. The global longitudinal strain is abnormal. The left ventricular internal cavity size was normal in size. There is no left ventricular hypertrophy. Left ventricular diastolic parameters were normal. Normal left ventricular filling pressure. Right Ventricle: The right ventricular size is mildly enlarged. No increase in right ventricular wall thickness. Right ventricular systolic function is normal. Tricuspid regurgitation signal is inadequate for assessing PA  pressure. Left Atrium: Left atrial size was normal in size. Right Atrium: Right atrial size was normal in size. Pericardium: There is a small pericardial effusion, mostly around the LV apex and lateral wall, in the immediate vicinity of an area of complex loculated small left pleural effusion. There appears to be exaggerated respiratory displacement of the ventricular septum, suggesting ventricular interdependence. Consider a component of constrictive physiology. A small pericardial effusion is present. There is no evidence of cardiac tamponade. Mitral Valve: The mitral valve is normal in  structure. Trivial mitral valve regurgitation. No evidence of mitral valve stenosis. Tricuspid Valve: The tricuspid valve is normal in structure. Tricuspid valve regurgitation is not demonstrated. Aortic Valve: The aortic valve is tricuspid. Aortic valve regurgitation is not visualized. No aortic stenosis is present. Pulmonic Valve: The pulmonic valve was grossly normal. Pulmonic valve regurgitation is not visualized. No evidence of pulmonic stenosis. Aorta: The aortic root and ascending aorta are structurally normal, with no evidence of dilitation. Venous: The inferior vena cava is dilated in size with greater than 50% respiratory variability, suggesting right atrial pressure of 8 mmHg. IAS/Shunts: No atrial level shunt detected by color flow Doppler.  LEFT VENTRICLE PLAX 2D LVIDd:         4.30 cm   Diastology LVIDs:         3.40 cm   LV e' medial:    8.59 cm/s LV PW:         1.00 cm   LV E/e' medial:  9.0 LV IVS:        0.90 cm   LV e' lateral:   8.81 cm/s LVOT diam:     2.20 cm   LV E/e' lateral: 8.8 LV SV:         71 LV SV Index:   38        2D Longitudinal Strain LVOT Area:     3.80 cm  2D Strain GLS Avg:     -12.9 %  RIGHT VENTRICLE             IVC RV Basal diam:  3.00 cm     IVC diam: 2.30 cm RV S prime:     13.10 cm/s TAPSE (M-mode): 1.8 cm LEFT ATRIUM             Index        RIGHT ATRIUM           Index LA diam:         2.60 cm 1.38 cm/m   RA Area:     16.70 cm LA Vol (A2C):   61.9 ml 32.84 ml/m  RA Volume:   47.30 ml  25.09 ml/m LA Vol (A4C):   50.7 ml 26.90 ml/m LA Biplane Vol: 57.6 ml 30.56 ml/m  AORTIC VALVE LVOT Vmax:   106.00 cm/s LVOT Vmean:  66.100 cm/s LVOT VTI:    0.187 m  AORTA Ao Root diam: 3.50 cm Ao Asc diam:  3.40 cm MITRAL VALVE MV Area (PHT): 3.85 cm    SHUNTS MV Decel Time: 197 msec    Systemic VTI:  0.19 m MV E velocity: 77.70 cm/s  Systemic Diam: 2.20 cm MV A velocity: 79.30 cm/s MV E/A ratio:  0.98 Mihai Croitoru MD Electronically signed by Thurmon Fair MD Signature Date/Time: 02/08/2023/4:53:23 PM    Final    DG CHEST PORT 1 VIEW Result Date: 02/08/2023 CLINICAL DATA:  63 year old male with left hydropneumothorax, loculation, suspicious for empyema. Chest tube placement. EXAM: PORTABLE CHEST 1 VIEW COMPARISON:  CT chest 1819 hours yesterday. Portable chest 0327 hours today. FINDINGS: Portable AP semi upright view at 1055 hours. Pigtail left lower chest tube now in place. Decreased left lung base opacification but continued left lower lung volume loss with rind like thickening and small volume loculated left lung base gas/pneumothorax now. Ongoing confluent and indistinct left perihilar opacity with some air bronchograms. Ongoing left hemithorax volume loss with some leftward mediastinal shift. Right lung appears stable, negative. Visualized tracheal air column is within normal limits.  Stable visualized osseous structures. Negative bowel gas. IMPRESSION: 1. Left lower pigtail chest tube placed with evidence of some loculated fluid drainage, but poor lung inflation and associated left lower pleural air now. Ongoing left perihilar consolidation and opacity. 2. Stable right lung. Electronically Signed   By: Odessa Fleming M.D.   On: 02/08/2023 11:30     Assessment/Plan: CV-Previous new onset a fib with RVR. SR this am. On Amiodarone drip. Echo done yesterday showed LV with global hypokinesis, trivial MR,  small pericardial effusion Pulmonary-History of COPD, ongoing tobacco abuse. He is on 4 liters of oxygen via [place]. Left chest tube with 2680 since placement by pulmonary/CCM yesterday. Left chest tube is to suction. Thrombolytics placed after chest tube placement. CXR this am appears to show left base consolidation, volume loss). 3. Supplement potassium 4. ID-on Cefepime, Vancomycin, and Flagyl for PNA/left empyema. Gram stain of fluid showed abundant gram negative rods and positive cocci. Await culture. WBC this am decreased to 18,500. Await blood cultures. 5. Regarding pain control, on Dilaudid IV Q 2 PRN, Toradol Q 8 PRN, Tylenol. Could consider Toradol scheduled and addition of Oxy but will defer to primary  Ardelle Balls PA-C 02/09/2023 7:05 AM

## 2023-02-09 NOTE — Progress Notes (Signed)
[facility] ADULT ICU REPLACEMENT PROTOCOL   The patient does apply for the [facility] Adult ICU Electrolyte Replacment Protocol based on the criteria listed below:   1.Exclusion criteria: TCTS, ECMO, Dialysis, and Myasthenia Gravis patients 2. Is GFR >/= 30 ml/min? Yes.    Patient's GFR today is >60 3. Is SCr </= 2? Yes.   Patient's SCr is 0.58 mg/dL 4. Did SCr increase >/= 0.5 in 24 hours? No. 5.Pt's weight >40kg  Yes.   6. Abnormal electrolyte(s): Potassium, Magnesium  7. Electrolytes replaced per protocol 8.  Call MD STAT for K+ </= 2.5, Phos </= 1, or Mag </= 1 Physician:  Dr. Namon Cirri A Sayana Salley 02/09/2023 5:34 AM

## 2023-02-09 NOTE — Plan of Care (Signed)
  Problem: Clinical Measurements: Goal: Ability to maintain clinical measurements within normal limits will improve Outcome: Progressing Goal: Will remain free from infection Outcome: Progressing Goal: Respiratory complications will improve Outcome: Progressing   

## 2023-02-09 NOTE — Consult Note (Signed)
Chief Complaint: Patient was seen in consultation today for empyema of left lung   Procedure: Left chest tube placement  Referring Physician(s): Dr. Levon Hedger  Supervising Physician: Marliss Coots  Patient Status: [facility] - In-pt  History of Present Illness: Mathew Wyatt is a 63 y.o. male with a history of tobacco use and COPD who initially presented to [facility] on 1/20 d/t 2 weeks of worsening SOB, DOE, and productive cough. Initially found to be tachypneic with significant leukocytosis 35.7 and lactic 4.4 and started on IV abx. CT chest significant for complex fluid collection of his left lung concerning for empyema vs abscess. He was transferred to [facility] on 1/21 initially to hospitalist service for CT surgery consult and possible VATS. However, while in ED patient went into Afib w/ RVR and hypotension and was admitted to CCM. CCM placed left-sided chest tube into the larger fluid collection on 02/08/23. Culture pending. CCM with request to place second chest tube into anterior pleural space for additional fluid drainage.   Patient resting comfortably in bed on arrival to room. Currently has no complaints. NPO at midnight.   Code Status: Full code   History reviewed. No pertinent past medical history.  Past Surgical History:  Procedure Laterality Date   HERNIA REPAIR     MANDIBLE SURGERY      Allergies: Patient has no known allergies.  Medications: Prior to Admission medications   Not on File     History reviewed. No pertinent family history.  Social History   Socioeconomic History   Marital status: Single    Spouse name: Not on file   Number of children: Not on file   Years of education: Not on file   Highest education level: Not on file  Occupational History   Not on file  Tobacco Use   Smoking status: Every Day    Current packs/day: 1.00    Types: Cigarettes   Smokeless tobacco: Never  Substance and Sexual Activity   Alcohol use: No   Drug use: No   Sexual  activity: Not on file  Other Topics Concern   Not on file  Social History Narrative   Not on file   Social Drivers of Health   Financial Resource Strain: Not on file  Food Insecurity: Not on file  Transportation Needs: Not on file  Physical Activity: Not on file  Stress: Not on file  Social Connections: Not on file    Review of Systems Denies any N/V, chest pain, shortness of breath, fevers/chills. All other ROS negative.  Vital Signs: BP (!) 118/56   Pulse 88   Temp 97.7 F (36.5 C) (Axillary)   Resp 19   Wt 176 lb 9.4 oz (80.1 kg)   SpO2 94%   BMI 23.95 kg/m    Physical Exam Constitutional:      Appearance: Normal appearance. He is well-developed.  HENT:     Head: Normocephalic and atraumatic.     Mouth/Throat:     Mouth: Mucous membranes are moist.     Pharynx: Oropharynx is clear.  Cardiovascular:     Rate and Rhythm: Normal rate and regular rhythm.  Pulmonary:     Effort: Pulmonary effort is normal.     Breath sounds: Examination of the left-upper field reveals decreased breath sounds. Examination of the left-middle field reveals decreased breath sounds. Examination of the left-lower field reveals decreased breath sounds. Decreased breath sounds present.  Musculoskeletal:        General: Normal range of  motion.     Cervical back: Normal range of motion.  Skin:    General: Skin is warm and dry.  Neurological:     General: No focal deficit present.     Mental Status: He is alert and oriented to person, place, and time.  Psychiatric:        Mood and Affect: Mood normal.        Behavior: Behavior normal.     Imaging: DG CHEST PORT 1 VIEW Result Date: 02/09/2023 CLINICAL DATA:  63 year old male with left hydropneumothorax, loculation, empyema. Chest tube placement. EXAM: PORTABLE CHEST 1 VIEW COMPARISON:  Chest CT 1035 hours today reported separately. FINDINGS: Portable AP semi upright view at 0736 hours. Pigtail left chest tube appears stable since  yesterday. Regression of the post drainage left lower lung pleural air since yesterday. Otherwise on going veiling and confluent left mid and lower lung opacity. See additional details on CT. Right lung ventilation not significantly changed. Mediastinal contour stable. Visualized tracheal air column is within normal limits. No acute osseous abnormality identified. IMPRESSION: 1. Regression of post drainage left lower pleural air since yesterday. Stable left chest tube. 2. Otherwise stable chest, see additional details on CT today reported just now. Electronically Signed   By: Odessa Fleming M.D.   On: 02/09/2023 11:01   CT CHEST WO CONTRAST Result Date: 02/09/2023 CLINICAL DATA:  62 year old male with left hydropneumothorax, loculation, empyema. Chest tube placement. EXAM: CT CHEST WITHOUT CONTRAST TECHNIQUE: Multidetector CT imaging of the chest was performed following the standard protocol without IV contrast. RADIATION DOSE REDUCTION: This exam was performed according to the departmental dose-optimization program which includes automated exposure control, adjustment of the mA and/or kV according to patient size and/or use of iterative reconstruction technique. COMPARISON:  Portable chest radiographs yesterday, CT 02/07/2023. FINDINGS: Cardiovascular: Vascular patency is not evaluated in the absence of IV contrast. Heart size remains within normal limits. There is a small pericardial effusion now suspected on series 3, image 132. Mild Calcified aortic atherosclerosis. Mediastinum/Nodes: Abnormal medial left anterior lung/pleural collection inseparable from the mediastinum including along the main pulmonary artery (series 3, image 93) further detailed below. No separate mediastinal mass. Mediastinal lymph nodes appear stable and reactive. But furthermore, there is moderate epicardial and superior diaphragmatic soft tissue stranding near the cardiac apex on series 3, image 126. Lungs/Pleura: Oval loculated lung or  pleural collection along the anterior upper lobe contiguous with the mediastinum has trace internal gas (series 3, image 93 and otherwise measures fluid density encompassing 78 x 48 by 112 mm now (AP by transverse by CC) for an estimated volume of 200 mL (coronal image 99) this appears slightly larger since 02/07/2023. Interval subtotal drainage of the dominant loculated left lung or pleural collection seen previously with left lower lung pigtail drainage catheter now terminating on series 3, image 136. However, ongoing extensive left perihilar and lower lung consolidation there. Small loculated left pneumothorax or residual empyema along the lung periphery such as series 3, image 81. Overall no significant improvement in left lung ventilation since the prior CT. Central airways remain patent. New small layering right pleural effusion (series 3, image 131). Increased patchy right lower lobe adjacent lung opacity with redemonstrated 14 mm lung nodule on series 4, image 116. Right upper lobe and right middle lobe are stable. Upper Abdomen: Negative visible noncontrast liver, spleen, pancreas, bowel in the upper abdomen. Stable visible adrenal glands and kidneys. Musculoskeletal: No acute or suspicious osseous lesion identified. Left chest wall  edema, trace soft tissue gas following chest tube placement. IMPRESSION: 1. Pigtail left lower lung pleural catheter in place with substantial drainage of the dominant pleural collection/empyema, but extensive ongoing left mid and lower lung Consolidation such that no significant improvement in left lung ventilation. 2. Separate loculated and un-drained left anterior chest/pleural collection/empyema which is inseparable from the mediastinum (see #3) and has an estimated volume of 200 mL (coronal image 99). 3. Small pericardial effusion is apparent now, with epicardial inflammatory stranding near the cardiac apex. Secondary Infectious Pericarditis not excluded. Reactive appearing  mediastinal lymph nodes. 4. Small new layering right pleural effusion. Increasing right lower lobe opacity which could be atelectasis or infection. Electronically Signed   By: Odessa Fleming M.D.   On: 02/09/2023 10:59   ECHOCARDIOGRAM COMPLETE Result Date: 02/08/2023    ECHOCARDIOGRAM REPORT   Patient Name:   Mathew Wyatt Date of Exam: 02/08/2023 Medical Rec #:  [id number]    Height:       72.0 in Accession #:    [id number]   Weight:       149.9 lb Date of Birth:  1960/11/05     BSA:          1.885 m Patient Age:    62 years     BP:           106/69 mmHg Patient Gender: M            HR:           75 bpm. Exam Location:  Inpatient Procedure: 2D Echo, Color Doppler and Cardiac Doppler Indications:    atrial fibrillation  History:        Patient has no prior history of Echocardiogram examinations.                 COPD and Sepsis; Risk Factors:Current Smoker.  Sonographer:    Delcie Roch RDCS Referring Phys: [id number] DANIEL R BENSIMHON IMPRESSIONS  1. Left ventricular ejection fraction, by estimation, is 45 to 50%. The left ventricle has mildly decreased function. The left ventricle demonstrates global hypokinesis. Left ventricular diastolic parameters were normal. The average left ventricular global longitudinal strain is -12.9 %. The global longitudinal strain is abnormal.  2. Right ventricular systolic function is normal. The right ventricular size is mildly enlarged. Tricuspid regurgitation signal is inadequate for assessing PA pressure.  3. There is a small pericardial effusion, mostly around the LV apex and lateral wall, in the immediate vicinity of an area of complex loculated small left pleural effusion. There appears to be exaggerated respiratory displacement of the ventricular septum, suggesting ventricular interdependence. Consider a component of constrictive physiology. a small pericardial effusion is present. There is no evidence of cardiac tamponade.  4. The mitral valve is normal in structure. Trivial mitral  valve regurgitation. No evidence of mitral stenosis.  5. The aortic valve is tricuspid. Aortic valve regurgitation is not visualized. No aortic stenosis is present.  6. The inferior vena cava is dilated in size with >50% respiratory variability, suggesting right atrial pressure of 8 mmHg. FINDINGS  Left Ventricle: Left ventricular ejection fraction, by estimation, is 45 to 50%. The left ventricle has mildly decreased function. The left ventricle demonstrates global hypokinesis. The average left ventricular global longitudinal strain is -12.9 %. The global longitudinal strain is abnormal. The left ventricular internal cavity size was normal in size. There is no left ventricular hypertrophy. Left ventricular diastolic parameters were normal. Normal left ventricular filling pressure. Right Ventricle: The right ventricular  size is mildly enlarged. No increase in right ventricular wall thickness. Right ventricular systolic function is normal. Tricuspid regurgitation signal is inadequate for assessing PA pressure. Left Atrium: Left atrial size was normal in size. Right Atrium: Right atrial size was normal in size. Pericardium: There is a small pericardial effusion, mostly around the LV apex and lateral wall, in the immediate vicinity of an area of complex loculated small left pleural effusion. There appears to be exaggerated respiratory displacement of the ventricular septum, suggesting ventricular interdependence. Consider a component of constrictive physiology. A small pericardial effusion is present. There is no evidence of cardiac tamponade. Mitral Valve: The mitral valve is normal in structure. Trivial mitral valve regurgitation. No evidence of mitral valve stenosis. Tricuspid Valve: The tricuspid valve is normal in structure. Tricuspid valve regurgitation is not demonstrated. Aortic Valve: The aortic valve is tricuspid. Aortic valve regurgitation is not visualized. No aortic stenosis is present. Pulmonic Valve: The  pulmonic valve was grossly normal. Pulmonic valve regurgitation is not visualized. No evidence of pulmonic stenosis. Aorta: The aortic root and ascending aorta are structurally normal, with no evidence of dilitation. Venous: The inferior vena cava is dilated in size with greater than 50% respiratory variability, suggesting right atrial pressure of 8 mmHg. IAS/Shunts: No atrial level shunt detected by color flow Doppler.  LEFT VENTRICLE PLAX 2D LVIDd:         4.30 cm   Diastology LVIDs:         3.40 cm   LV e' medial:    8.59 cm/s LV PW:         1.00 cm   LV E/e' medial:  9.0 LV IVS:        0.90 cm   LV e' lateral:   8.81 cm/s LVOT diam:     2.20 cm   LV E/e' lateral: 8.8 LV SV:         71 LV SV Index:   38        2D Longitudinal Strain LVOT Area:     3.80 cm  2D Strain GLS Avg:     -12.9 %  RIGHT VENTRICLE             IVC RV Basal diam:  3.00 cm     IVC diam: 2.30 cm RV S prime:     13.10 cm/s TAPSE (M-mode): 1.8 cm LEFT ATRIUM             Index        RIGHT ATRIUM           Index LA diam:        2.60 cm 1.38 cm/m   RA Area:     16.70 cm LA Vol (A2C):   61.9 ml 32.84 ml/m  RA Volume:   47.30 ml  25.09 ml/m LA Vol (A4C):   50.7 ml 26.90 ml/m LA Biplane Vol: 57.6 ml 30.56 ml/m  AORTIC VALVE LVOT Vmax:   106.00 cm/s LVOT Vmean:  66.100 cm/s LVOT VTI:    0.187 m  AORTA Ao Root diam: 3.50 cm Ao Asc diam:  3.40 cm MITRAL VALVE MV Area (PHT): 3.85 cm    SHUNTS MV Decel Time: 197 msec    Systemic VTI:  0.19 m MV E velocity: 77.70 cm/s  Systemic Diam: 2.20 cm MV A velocity: 79.30 cm/s MV E/A ratio:  0.98 Mihai Croitoru MD Electronically signed by Thurmon Fair MD Signature Date/Time: 02/08/2023/4:53:23 PM    Final    DG CHEST  PORT 1 VIEW Result Date: 02/08/2023 CLINICAL DATA:  63 year old male with left hydropneumothorax, loculation, suspicious for empyema. Chest tube placement. EXAM: PORTABLE CHEST 1 VIEW COMPARISON:  CT chest 1819 hours yesterday. Portable chest 0327 hours today. FINDINGS: Portable AP semi  upright view at 1055 hours. Pigtail left lower chest tube now in place. Decreased left lung base opacification but continued left lower lung volume loss with rind like thickening and small volume loculated left lung base gas/pneumothorax now. Ongoing confluent and indistinct left perihilar opacity with some air bronchograms. Ongoing left hemithorax volume loss with some leftward mediastinal shift. Right lung appears stable, negative. Visualized tracheal air column is within normal limits. Stable visualized osseous structures. Negative bowel gas. IMPRESSION: 1. Left lower pigtail chest tube placed with evidence of some loculated fluid drainage, but poor lung inflation and associated left lower pleural air now. Ongoing left perihilar consolidation and opacity. 2. Stable right lung. Electronically Signed   By: Odessa Fleming M.D.   On: 02/08/2023 11:30   DG CHEST PORT 1 VIEW Result Date: 02/08/2023 CLINICAL DATA:  [id number] with left chest empyema and shortness of breath. EXAM: PORTABLE CHEST 1 VIEW COMPARISON:  Chest CT yesterday at 6:17 p.m. FINDINGS: 3:27 a.m. Large air fluid collections in the left hemithorax are noted obscuring the left lung from the level of the aortic knob down. The partially aerated left upper lung field demonstrates increased airspace disease concerning for worsening pneumonia. Nodular airspace disease in the right lower lung field was better seen on CT, but appeared consistent with bronchopneumonia. Remainder of the lungs are clear with emphysematous changes. The cardiac size is normal. The mediastinal configuration is normal. Thoracic cage is intact. IMPRESSION: 1. Large air fluid collections in the left hemithorax obscuring the left lung from the level of the aortic knob down. 2. Increased airspace disease in the partially aerated left upper lung field concerning for worsening pneumonia. 3. Nodular airspace disease in the right lower lung field was better seen on CT, but appeared consistent with  bronchopneumonia. Electronically Signed   By: Almira Bar M.D.   On: 02/08/2023 06:02   CT CHEST ABDOMEN PELVIS W CONTRAST Result Date: 02/07/2023 CLINICAL DATA:  Shortness of breath, tachycardia, sepsis. Large hydropneumothorax concerning for empyema. Right upper quadrant pain. EXAM: CT CHEST, ABDOMEN, AND PELVIS WITH CONTRAST TECHNIQUE: Multidetector CT imaging of the chest, abdomen and pelvis was performed following the standard protocol during bolus administration of intravenous contrast. RADIATION DOSE REDUCTION: This exam was performed according to the departmental dose-optimization program which includes automated exposure control, adjustment of the mA and/or kV according to patient size and/or use of iterative reconstruction technique. CONTRAST:  OMNIPAQUE IOHEXOL 300 MG/ML  SOLN COMPARISON:  Chest x-ray today FINDINGS: CT CHEST FINDINGS Cardiovascular: Heart is normal size. Aorta is normal caliber. Mediastinum/Nodes: Small scattered mediastinal lymph nodes, none pathologically enlarged. No mediastinal, hilar, or axillary adenopathy. Trachea and esophagus are unremarkable. Thyroid unremarkable. Lungs/Pleura: Mild centrilobular emphysema. Large air and fluid collection noted in the left hemithorax compressing the left lung, measuring 19 x 18 x 10 cm. Separate fluid collection noted medially along the anterior mediastinum measuring 11 x 6.4 x 5.5 cm. Airspace disease in the adjacent left lung. Clustered nodular airspace disease in the right lower lobe with largest nodule measuring up to 1.4 cm. There is airway thickening in the right lower lobe. No effusion on the right. Musculoskeletal: No acute bony abnormality. CT ABDOMEN PELVIS FINDINGS Hepatobiliary: No focal hepatic abnormality. Gallbladder unremarkable. Pancreas: No  focal abnormality or ductal dilatation. Spleen: Small low-density area anteriorly in the spleen, likely small cyst. Normal size. No suspicious abnormality. Adrenals/Urinary  Tract: Left adrenal nodule measures 1.2 cm and is stable since 2023 most compatible with adenoma. Right adrenal gland and kidneys unremarkable. No stones or hydronephrosis. Urinary bladder unremarkable. Stomach/Bowel: Stomach, large and small bowel grossly unremarkable. Vascular/Lymphatic: Aortoiliac atherosclerosis. No evidence of aneurysm or adenopathy. Reproductive: No visible focal abnormality. Other: No free fluid or free air. Musculoskeletal: No acute bony abnormality. IMPRESSION: Large air and fluid collections noted in the left chest as measured and described above. It is difficult to determine if these reflect pleural collections and thus hydropneumothorax and empyema or reflect pulmonary collections and abscesses. Compression of the underlying left lung noted in the left lower lobe with patchy airspace disease adjacent in the left upper lobe. Clustered nodular airspace disease in the right lower lobe with airway thickening. This likely reflects bronchopneumonia. No acute findings in the abdomen or pelvis. Emphysema. Electronically Signed   By: Charlett Nose M.D.   On: 02/07/2023 18:43   DG Chest 2 View Result Date: 02/07/2023 CLINICAL DATA:  Cough. EXAM: CHEST - 2 VIEW COMPARISON:  None Available. FINDINGS: AP projection demonstrates dense opacification of the LEFT floor lobe with complete obscuration of the LEFT heart. There is a long air-fluid level within the LEFT lower lobe measuring 13 cm on lateral projection. Findings consistent with hydropneumothorax. RIGHT lung is clear. IMPRESSION: 1. Large hydropneumothorax in the LEFT lower lobe with long air-fluid level. Findings are concerning for infection or neoplasm of the LEFT lower lobe with accompanying empyema or pulmonary abscess. 2. Recommend CT thorax with contrast for further evaluation. Electronically Signed   By: Genevive Bi M.D.   On: 02/07/2023 16:36    Labs:  CBC: Recent Labs    02/07/23 1528 02/08/23 0238 02/08/23 0946  02/09/23 0318  WBC 35.7* 26.5* 31.0* 18.5*  HGB 11.3* 10.3* 9.8* 10.2*  HCT 35.3* 31.6* 30.7* 32.0*  PLT 396 300 336 268    COAGS: Recent Labs    02/08/23 0453 02/08/23 0946  INR 1.3* 1.6*  APTT  --  48*    BMP: Recent Labs    02/07/23 1528 02/08/23 0238 02/08/23 0946 02/09/23 0318  NA 134* 134*  --  138  K 4.2 3.8  --  2.9*  CL 93* 100  --  108  CO2 24 25  --  25  GLUCOSE 237* 153*  --  135*  BUN 25* 19  --  18  CALCIUM 8.9 8.2*  --  7.9*  CREATININE 0.92 0.82 0.72 0.58*  GFRNONAA >60 >60 >60 >60    LIVER FUNCTION TESTS: Recent Labs    02/07/23 1528 02/09/23 0318  BILITOT 2.1* 1.2  AST 33 44*  ALT 22 26  ALKPHOS 157* 117  PROT 8.0 5.6*  ALBUMIN 1.8* <1.5*    TUMOR MARKERS: No results for input(s): "AFPTM", "CEA", "CA199", "CHROMGRNA" in the last 8760 hours.  Assessment and Plan:  63 y.o. male with a history of tobacco use and COPD who initially presented to [facility] on 1/20 d/t 2 weeks of worsening SOB, DOE, and productive cough. Initially found to be tachypneic with significant leukocytosis 35.7 and lactic 4.4 and started on IV abx. CT chest significant for complex fluid collection of his left lung concerning for empyema vs abscess. Transferred to [facility] on 1/21 for CT surgery consult and possible VATS. In ED patient went into Afib w/ RVR and hypotension  and was admitted to CCM. CCM placed left-sided chest tube into the larger fluid collection on 02/08/23. Culture pending. CCM with request to place second chest tube into anterior pleural space for additional fluid drainage.   -NPO at midnight Plan for chest tube placement on 02/10/23  Risks and benefits of chest tube placement were discussed with the patient including bleeding, infection, damage to adjacent structures, malfunction of the tube requiring additional procedures and sepsis.  All of the patient's questions were answered, patient is agreeable to proceed. Consent signed and in chart.   Thank you for  this interesting consult. I greatly enjoyed meeting Lewey Cheeves and look forward to participating in their care. A copy of this report was sent to the requesting provider on this date.  Electronically Signed: Jama Flavors, PA-C 02/09/2023, 1:48 PM   I spent a total of 40 Minutes  in face to face clinical consultation, greater than 50% of which was counseling/coordinating care for chest tube placement.

## 2023-02-09 NOTE — Progress Notes (Addendum)
Advanced Heart Failure Rounding Note  Cardiologist: None  Chief Complaint: SOB  Subjective:    In NSR this morning. Remains on amio gtt at 30/hr   On Cefepime + flagyl for PNA.  Pleural Fluid Cx growing GPR + GPC. Bcx NGTD  WBC 31>>18K  He feels breathing has improved, remains on 4L/[place]. O2 sats 92%.   Echo: EF 45-50%, global HK, RV nl, small pericardial effusion around LV apex/lateral wall, no tamponade.   K 2.9  Mg 1.9    Objective:   Weight Range: 80.1 kg Body mass index is 23.95 kg/m.   Vital Signs:   Temp:  [97.1 F (36.2 C)-98.7 F (37.1 C)] 97.1 F (36.2 C) (01/22 0800) Pulse Rate:  [73-158] 85 (01/22 0800) Resp:  [13-45] 20 (01/22 0800) BP: (95-123)/(53-100) 115/60 (01/22 0800) SpO2:  [89 %-98 %] 98 % (01/22 0800) Weight:  [80.1 kg] 80.1 kg (01/22 0600) Last BM Date : 02/04/23  Weight change: Filed Weights   02/09/23 0600  Weight: 80.1 kg    Intake/Output:   Intake/Output Summary (Last 24 hours) at 02/09/2023 0905 Last data filed at 02/09/2023 7829 Gross per 24 hour  Intake 3218.53 ml  Output 3405 ml  Net -186.47 ml      Physical Exam    General: fatigued appearing. No resp difficulty HEENT: Normal Neck: Supple. JVP not elevated . Carotids 2+ bilat; no bruits. No lymphadenopathy or thyromegaly appreciated. Cor: PMI nondisplaced. Regular rate & rhythm. No rubs, gallops or murmurs. + CTs  Lungs: clear on the rt anteriorly, left lung field course  Abdomen: Soft, nontender, nondistended. No hepatosplenomegaly. No bruits or masses. Good bowel sounds. Extremities: No cyanosis, clubbing, rash, edema Neuro: Alert & orientedx3, cranial nerves grossly intact. moves all 4 extremities w/o difficulty. Affect pleasant   Telemetry   NSR 70s, personally reviewed   EKG    N/A   Labs    CBC Recent Labs    02/07/23 1528 02/08/23 0238 02/08/23 0946 02/09/23 0318  WBC 35.7* 26.5* 31.0* 18.5*  NEUTROABS 32.3* 23.8*  --   --   HGB 11.3*  10.3* 9.8* 10.2*  HCT 35.3* 31.6* 30.7* 32.0*  MCV 93.9 93.8 94.8 93.6  PLT 396 300 336 268   Basic Metabolic Panel Recent Labs    56/21/30 0238 02/08/23 0946 02/09/23 0318  NA 134*  --  138  K 3.8  --  2.9*  CL 100  --  108  CO2 25  --  25  GLUCOSE 153*  --  135*  BUN 19  --  18  CREATININE 0.82 0.72 0.58*  CALCIUM 8.2*  --  7.9*  MG  --   --  1.9  PHOS  --   --  2.5   Liver Function Tests Recent Labs    02/07/23 1528  AST 33  ALT 22  ALKPHOS 157*  BILITOT 2.1*  PROT 8.0  ALBUMIN 1.8*   Recent Labs    02/07/23 1528  LIPASE 17   Cardiac Enzymes No results for input(s): "CKTOTAL", "CKMB", "CKMBINDEX", "TROPONINI" in the last 72 hours.  BNP: BNP (last 3 results) No results for input(s): "BNP" in the last 8760 hours.  ProBNP (last 3 results) No results for input(s): "PROBNP" in the last 8760 hours.   D-Dimer No results for input(s): "DDIMER" in the last 72 hours. Hemoglobin A1C No results for input(s): "HGBA1C" in the last 72 hours. Fasting Lipid Panel No results for input(s): "CHOL", "HDL", "LDLCALC", "TRIG", "CHOLHDL", "  LDLDIRECT" in the last 72 hours. Thyroid Function Tests No results for input(s): "TSH", "T4TOTAL", "T3FREE", "THYROIDAB" in the last 72 hours.  Invalid input(s): "FREET3"  Other results:   Imaging    ECHOCARDIOGRAM COMPLETE Result Date: 02/08/2023    ECHOCARDIOGRAM REPORT   Patient Name:   Mathew Wyatt Date of Exam: 02/08/2023 Medical Rec #:  [id number]    Height:       72.0 in Accession #:    [id number]   Weight:       149.9 lb Date of Birth:  04-Mar-1960     BSA:          1.885 m Patient Age:    62 years     BP:           106/69 mmHg Patient Gender: M            HR:           75 bpm. Exam Location:  Inpatient Procedure: 2D Echo, Color Doppler and Cardiac Doppler Indications:    atrial fibrillation  History:        Patient has no prior history of Echocardiogram examinations.                 COPD and Sepsis; Risk Factors:Current Smoker.   Sonographer:    Delcie Roch RDCS Referring Phys: [id number] Eviana Sibilia R Jairon Ripberger IMPRESSIONS  1. Left ventricular ejection fraction, by estimation, is 45 to 50%. The left ventricle has mildly decreased function. The left ventricle demonstrates global hypokinesis. Left ventricular diastolic parameters were normal. The average left ventricular global longitudinal strain is -12.9 %. The global longitudinal strain is abnormal.  2. Right ventricular systolic function is normal. The right ventricular size is mildly enlarged. Tricuspid regurgitation signal is inadequate for assessing PA pressure.  3. There is a small pericardial effusion, mostly around the LV apex and lateral wall, in the immediate vicinity of an area of complex loculated small left pleural effusion. There appears to be exaggerated respiratory displacement of the ventricular septum, suggesting ventricular interdependence. Consider a component of constrictive physiology. a small pericardial effusion is present. There is no evidence of cardiac tamponade.  4. The mitral valve is normal in structure. Trivial mitral valve regurgitation. No evidence of mitral stenosis.  5. The aortic valve is tricuspid. Aortic valve regurgitation is not visualized. No aortic stenosis is present.  6. The inferior vena cava is dilated in size with >50% respiratory variability, suggesting right atrial pressure of 8 mmHg. FINDINGS  Left Ventricle: Left ventricular ejection fraction, by estimation, is 45 to 50%. The left ventricle has mildly decreased function. The left ventricle demonstrates global hypokinesis. The average left ventricular global longitudinal strain is -12.9 %. The global longitudinal strain is abnormal. The left ventricular internal cavity size was normal in size. There is no left ventricular hypertrophy. Left ventricular diastolic parameters were normal. Normal left ventricular filling pressure. Right Ventricle: The right ventricular size is mildly enlarged. No  increase in right ventricular wall thickness. Right ventricular systolic function is normal. Tricuspid regurgitation signal is inadequate for assessing PA pressure. Left Atrium: Left atrial size was normal in size. Right Atrium: Right atrial size was normal in size. Pericardium: There is a small pericardial effusion, mostly around the LV apex and lateral wall, in the immediate vicinity of an area of complex loculated small left pleural effusion. There appears to be exaggerated respiratory displacement of the ventricular septum, suggesting ventricular interdependence. Consider a component of constrictive physiology. A small pericardial effusion  is present. There is no evidence of cardiac tamponade. Mitral Valve: The mitral valve is normal in structure. Trivial mitral valve regurgitation. No evidence of mitral valve stenosis. Tricuspid Valve: The tricuspid valve is normal in structure. Tricuspid valve regurgitation is not demonstrated. Aortic Valve: The aortic valve is tricuspid. Aortic valve regurgitation is not visualized. No aortic stenosis is present. Pulmonic Valve: The pulmonic valve was grossly normal. Pulmonic valve regurgitation is not visualized. No evidence of pulmonic stenosis. Aorta: The aortic root and ascending aorta are structurally normal, with no evidence of dilitation. Venous: The inferior vena cava is dilated in size with greater than 50% respiratory variability, suggesting right atrial pressure of 8 mmHg. IAS/Shunts: No atrial level shunt detected by color flow Doppler.  LEFT VENTRICLE PLAX 2D LVIDd:         4.30 cm   Diastology LVIDs:         3.40 cm   LV e' medial:    8.59 cm/s LV PW:         1.00 cm   LV E/e' medial:  9.0 LV IVS:        0.90 cm   LV e' lateral:   8.81 cm/s LVOT diam:     2.20 cm   LV E/e' lateral: 8.8 LV SV:         71 LV SV Index:   38        2D Longitudinal Strain LVOT Area:     3.80 cm  2D Strain GLS Avg:     -12.9 %  RIGHT VENTRICLE             IVC RV Basal diam:  3.00 cm      IVC diam: 2.30 cm RV S prime:     13.10 cm/s TAPSE (M-mode): 1.8 cm LEFT ATRIUM             Index        RIGHT ATRIUM           Index LA diam:        2.60 cm 1.38 cm/m   RA Area:     16.70 cm LA Vol (A2C):   61.9 ml 32.84 ml/m  RA Volume:   47.30 ml  25.09 ml/m LA Vol (A4C):   50.7 ml 26.90 ml/m LA Biplane Vol: 57.6 ml 30.56 ml/m  AORTIC VALVE LVOT Vmax:   106.00 cm/s LVOT Vmean:  66.100 cm/s LVOT VTI:    0.187 m  AORTA Ao Root diam: 3.50 cm Ao Asc diam:  3.40 cm MITRAL VALVE MV Area (PHT): 3.85 cm    SHUNTS MV Decel Time: 197 msec    Systemic VTI:  0.19 m MV E velocity: 77.70 cm/s  Systemic Diam: 2.20 cm MV A velocity: 79.30 cm/s MV E/A ratio:  0.98 Mihai Croitoru MD Electronically signed by Thurmon Fair MD Signature Date/Time: 02/08/2023/4:53:23 PM    Final    DG CHEST PORT 1 VIEW Result Date: 02/08/2023 CLINICAL DATA:  63 year old male with left hydropneumothorax, loculation, suspicious for empyema. Chest tube placement. EXAM: PORTABLE CHEST 1 VIEW COMPARISON:  CT chest 1819 hours yesterday. Portable chest 0327 hours today. FINDINGS: Portable AP semi upright view at 1055 hours. Pigtail left lower chest tube now in place. Decreased left lung base opacification but continued left lower lung volume loss with rind like thickening and small volume loculated left lung base gas/pneumothorax now. Ongoing confluent and indistinct left perihilar opacity with some air bronchograms. Ongoing left hemithorax volume loss with  some leftward mediastinal shift. Right lung appears stable, negative. Visualized tracheal air column is within normal limits. Stable visualized osseous structures. Negative bowel gas. IMPRESSION: 1. Left lower pigtail chest tube placed with evidence of some loculated fluid drainage, but poor lung inflation and associated left lower pleural air now. Ongoing left perihilar consolidation and opacity. 2. Stable right lung. Electronically Signed   By: Odessa Fleming M.D.   On: 02/08/2023 11:30      Medications:     Scheduled Medications:  Chlorhexidine Gluconate Cloth  6 each Topical Q0600   Gerhardt's butt cream   Topical TID   heparin  5,000 Units Subcutaneous Q8H   methocarbamol (ROBAXIN) injection  500 mg Intravenous Q8H   Or   methocarbamol  500 mg Oral Q8H   mupirocin ointment   Nasal BID   oxyCODONE  5 mg Oral Q6H   pantoprazole  40 mg Oral Daily   sodium chloride flush  10 mL Intrapleural Q8H    Infusions:  sodium chloride 100 mL/hr at 02/09/23 0400   amiodarone 30 mg/hr (02/09/23 0809)   ceFEPime (MAXIPIME) IV 2 g (02/09/23 0443)   metronidazole Stopped (02/09/23 0137)   norepinephrine (LEVOPHED) Adult infusion     potassium chloride 10 mEq (02/09/23 0831)   vancomycin (VANCOCIN) 750 mg in sodium chloride 0.9 % 250 mL IVPB Stopped (02/08/23 2257)    PRN Medications: acetaminophen **OR** acetaminophen, docusate sodium, HYDROmorphone (DILAUDID) injection, ketorolac, metoprolol tartrate, ondansetron **OR** ondansetron (ZOFRAN) IV, mouth rinse, polyethylene glycol    Patient Profile   63 yo male with COPD and ongoing tobacco use. No h/o heart disease.   Admitted with AF with RVR in setting of sepsis with massive left hydro/pneumo thorax.    Now s/p emergent left chest tube with > 2L pus out.   Assessment/Plan   1. New onset AF with RVR:  - No prior hx of atrial fibrillation.  - Suspect AF with RVR driven by acute illness  - back in NSR currently. Continue amio gtt at 30/hr  - continue to treat acute lung illness - supp K (2.9) and Mg (1.9)  - hold off on a/c for now, unless recurrence   2. Acute sepsis/PNA/empyema/Acute Hypoxic Respiratory Failure:  - massive left hydro/pneumo thorax on admit - s/p emergent Lt CT placement w/ > 2L pus out - On Cefepime + flagyl - Pleural Fluid Cx growing GPR + GPC. Bcx NGTD - WBC 31>>18K. 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 Tobacco use with COPD: - cessation advised    Length of Stay: 1  Brittainy Simmons, PA-C  02/09/2023, 9:05 AM  Advanced Heart Failure Team Pager [phone] (M-F; 7a - 5p)  Please contact [facility] for night-coverage after hours (5p -7a ) and weekends on [email or web address]  Patient seen and examined with the above-signed Advanced Practice Provider and/or Housestaff. I personally reviewed laboratory data, imaging studies and relevant notes. I independently examined the patient and formulated the important aspects of the plan. I have edited the note to reflect any of my changes or salient points. I have personally discussed the plan with the patient and/or family.  Feels weak and SOB. S/p partial CT drainage of large left empyema. Still residual pocket.  Back in NSR on IV amio.  ECHO EF 45-50%  General:  Weak appearing. N+ SOB HEENT: normal Neck: supple. no JVD. Carotids 2+ bilat; no bruits. No lymphadenopathy or thryomegaly appreciated. Cor: PMI nondisplaced. Regular rate & rhythm. No rubs, gallops or murmurs. Lungs: crackles L>R Abdomen: soft, nontender, nondistended. No hepatosplenomegaly. No bruits or masses. Good bowel sounds. Extremities: no cyanosis, clubbing, rash, edema Neuro: alert & orientedx3, cranial nerves grossly intact. moves all 4 extremities w/o difficulty. Affect pleasant  He is back in NSR on IV amio. Continue amio for now. Will not start AC unless AF recurs. EF low normal but hstrop ok. Will follow.   D/w CCM. Plan for additional CT today.   Arvilla Meres, MD  12:56 PM

## 2023-02-10 ENCOUNTER — Inpatient Hospital Stay (HOSPITAL_COMMUNITY): Payer: Medicaid Other

## 2023-02-10 LAB — BASIC METABOLIC PANEL
Anion gap: 9 (ref 5–15)
BUN: 15 mg/dL (ref 8–23)
CO2: 21 mmol/L — ABNORMAL LOW (ref 22–32)
Calcium: 7.8 mg/dL — ABNORMAL LOW (ref 8.9–10.3)
Chloride: 106 mmol/L (ref 98–111)
Creatinine, Ser: 0.62 mg/dL (ref 0.61–1.24)
GFR, Estimated: >60 mL/min (ref >60)
Glucose, Bld: 139 mg/dL — ABNORMAL HIGH (ref 70–99)
Potassium: 4.6 mmol/L (ref 3.5–5.1)
Sodium: 136 mmol/L (ref 135–145)

## 2023-02-10 LAB — CBC
HCT: 34.2 % — ABNORMAL LOW (ref 39.0–52.0)
Hemoglobin: 11.1 g/dL — ABNORMAL LOW (ref 13.0–17.0)
MCH: 30.3 pg (ref 26.0–34.0)
MCHC: 32.5 g/dL (ref 30.0–36.0)
MCV: 93.4 fL (ref 80.0–100.0)
Platelets: 260 10*3/uL (ref 150–400)
RBC: 3.66 MIL/uL — ABNORMAL LOW (ref 4.22–5.81)
RDW: 15.8 % — ABNORMAL HIGH (ref 11.5–15.5)
WBC: 17.3 10*3/uL — ABNORMAL HIGH (ref 4.0–10.5)
nRBC: 0 % (ref 0.0–0.2)

## 2023-02-10 LAB — PROTIME-INR
INR: 1.5 — ABNORMAL HIGH (ref 0.8–1.2)
Prothrombin Time: 18.7 s — ABNORMAL HIGH (ref 11.4–15.2)

## 2023-02-10 MED ORDER — AMIODARONE HCL 200 MG PO TABS: 400.0000 mg | ORAL_TABLET | Freq: Every day | ORAL | Status: DC

## 2023-02-10 MED ORDER — MIDAZOLAM HCL 2 MG/2ML IJ SOLN
INTRAMUSCULAR | Status: AC | PRN
  Administered 2023-02-10: 1 mg via INTRAVENOUS

## 2023-02-10 MED ORDER — MIDAZOLAM HCL 2 MG/2ML IJ SOLN
INTRAMUSCULAR | Status: AC
  Filled 2023-02-10: qty 2

## 2023-02-10 MED ORDER — FENTANYL CITRATE (PF) 100 MCG/2ML IJ SOLN
INTRAMUSCULAR | Status: AC
  Filled 2023-02-10: qty 2

## 2023-02-10 MED ORDER — METRONIDAZOLE 500 MG/100ML IV SOLN
500.0000 mg | Freq: Two times a day (BID) | INTRAVENOUS | Status: DC
Start: 2023-02-10 — End: 2023-02-14
  Administered 2023-02-10 – 2023-02-14 (×8): 500 mg via INTRAVENOUS
  Filled 2023-02-10 (×8): qty 100

## 2023-02-10 MED ORDER — FENTANYL CITRATE (PF) 100 MCG/2ML IJ SOLN
INTRAMUSCULAR | Status: AC | PRN
  Administered 2023-02-10 (×2): 25 ug via INTRAVENOUS

## 2023-02-10 MED ORDER — AMIODARONE HCL 200 MG PO TABS
400.0000 mg | ORAL_TABLET | Freq: Two times a day (BID) | ORAL | Status: DC
  Administered 2023-02-10 – 2023-02-11 (×3): 400 mg via ORAL
  Filled 2023-02-10 (×3): qty 2

## 2023-02-10 MED ORDER — HYDROMORPHONE HCL 1 MG/ML IJ SOLN
1.0000 mg | Freq: Once | INTRAMUSCULAR | Status: AC
  Administered 2023-02-10: 1 mg via INTRAVENOUS
  Filled 2023-02-10: qty 1

## 2023-02-10 MED ORDER — LIDOCAINE-EPINEPHRINE 1 %-1:100000 IJ SOLN
20.0000 mL | Freq: Once | INTRAMUSCULAR | Status: AC
  Administered 2023-02-10: 20 mL via INTRADERMAL

## 2023-02-10 NOTE — Progress Notes (Addendum)
Advanced Heart Failure Rounding Note  Cardiologist: None  Chief Complaint: SOB    Patient Profile   63 yo male with COPD and ongoing tobacco use. No h/o heart disease. Admitted with AF with RVR in setting of sepsis with massive left hydro/pneumo thorax. S/p emergent left chest tube with > 2L pus out.   Subjective:    Just returned from IR s/p additional left throacostomy tube placement for residual loculated empyema   On Cefepime + flagyl +Vanc for PNA.  Pleural Fluid Cx growing GPR + GPC. Bcx NGTD  WBC 31>>18>>pending  Remains in NSR on amio gtt at 30.   Continues on 4L [place] and still w/ pleuritic CP and dyspnea. No f/c.   Echo: EF 45-50%, global HK, RV nl, small pericardial effusion around LV apex/lateral wall, no tamponade.    Objective:   Weight Range: 80.1 kg Body mass index is 23.95 kg/m.   Vital Signs:   Temp:  [97.4 F (36.3 C)-98.8 F (37.1 C)] 97.7 F (36.5 C) (01/23 0400) Pulse Rate:  [74-97] 94 (01/23 0920) Resp:  [16-29] 28 (01/23 0920) BP: (95-128)/(50-69) 114/65 (01/23 0920) SpO2:  [84 %-100 %] 98 % (01/23 0920) Last BM Date :  (PTA)  Weight change: Filed Weights   02/09/23 0600  Weight: 80.1 kg    Intake/Output:   Intake/Output Summary (Last 24 hours) at 02/10/2023 1001 Last data filed at 02/10/2023 0600 Gross per 24 hour  Intake 2246.13 ml  Output 935 ml  Net 1311.13 ml      Physical Exam   General:  fatigued appearing. No respiratory difficulty HEENT: normal Neck: supple. no JVD. Carotids 2+ bilat; no bruits. No lymphadenopathy or thyromegaly appreciated. Cor: PMI nondisplaced. Regular rate & rhythm. No rubs, gallops or murmurs. Lungs: decreased BS at the bases anteriorly, left lung field less course today + CTs Abdomen: soft, nontender, nondistended. No hepatosplenomegaly. No bruits or masses. Good bowel sounds. Extremities: no cyanosis, clubbing, rash, edema Neuro: alert & oriented x 3, cranial nerves grossly intact. moves  all 4 extremities w/o difficulty. Affect pleasant.   Telemetry   NSR 90s, personally reviewed   EKG    N/A   Labs    CBC Recent Labs    02/07/23 1528 02/08/23 0238 02/08/23 0946 02/09/23 0318  WBC 35.7* 26.5* 31.0* 18.5*  NEUTROABS 32.3* 23.8*  --   --   HGB 11.3* 10.3* 9.8* 10.2*  HCT 35.3* 31.6* 30.7* 32.0*  MCV 93.9 93.8 94.8 93.6  PLT 396 300 336 268   Basic Metabolic Panel Recent Labs    16/10/96 0238 02/08/23 0946 02/09/23 0318  NA 134*  --  138  K 3.8  --  2.9*  CL 100  --  108  CO2 25  --  25  GLUCOSE 153*  --  135*  BUN 19  --  18  CREATININE 0.82 0.72 0.58*  CALCIUM 8.2*  --  7.9*  MG  --   --  1.9  PHOS  --   --  2.5   Liver Function Tests Recent Labs    02/07/23 1528 02/09/23 0318  AST 33 44*  ALT 22 26  ALKPHOS 157* 117  BILITOT 2.1* 1.2  PROT 8.0 5.6*  ALBUMIN 1.8* <1.5*   Recent Labs    02/07/23 1528  LIPASE 17   Cardiac Enzymes No results for input(s): "CKTOTAL", "CKMB", "CKMBINDEX", "TROPONINI" in the last 72 hours.  BNP: BNP (last 3 results) No results for input(s): "BNP"  in the last 8760 hours.  ProBNP (last 3 results) No results for input(s): "PROBNP" in the last 8760 hours.   D-Dimer No results for input(s): "DDIMER" in the last 72 hours. Hemoglobin A1C No results for input(s): "HGBA1C" in the last 72 hours. Fasting Lipid Panel No results for input(s): "CHOL", "HDL", "LDLCALC", "TRIG", "CHOLHDL", "LDLDIRECT" in the last 72 hours. Thyroid Function Tests No results for input(s): "TSH", "T4TOTAL", "T3FREE", "THYROIDAB" in the last 72 hours.  Invalid input(s): "FREET3"  Other results:   Imaging    CT CHEST WO CONTRAST Result Date: 02/09/2023 CLINICAL DATA:  63 year old male with left hydropneumothorax, loculation, empyema. Chest tube placement. EXAM: CT CHEST WITHOUT CONTRAST TECHNIQUE: Multidetector CT imaging of the chest was performed following the standard protocol without IV contrast. RADIATION DOSE  REDUCTION: This exam was performed according to the departmental dose-optimization program which includes automated exposure control, adjustment of the mA and/or kV according to patient size and/or use of iterative reconstruction technique. COMPARISON:  Portable chest radiographs yesterday, CT 02/07/2023. FINDINGS: Cardiovascular: Vascular patency is not evaluated in the absence of IV contrast. Heart size remains within normal limits. There is a small pericardial effusion now suspected on series 3, image 132. Mild Calcified aortic atherosclerosis. Mediastinum/Nodes: Abnormal medial left anterior lung/pleural collection inseparable from the mediastinum including along the main pulmonary artery (series 3, image 93) further detailed below. No separate mediastinal mass. Mediastinal lymph nodes appear stable and reactive. But furthermore, there is moderate epicardial and superior diaphragmatic soft tissue stranding near the cardiac apex on series 3, image 126. Lungs/Pleura: Oval loculated lung or pleural collection along the anterior upper lobe contiguous with the mediastinum has trace internal gas (series 3, image 93 and otherwise measures fluid density encompassing 78 x 48 by 112 mm now (AP by transverse by CC) for an estimated volume of 200 mL (coronal image 99) this appears slightly larger since 02/07/2023. Interval subtotal drainage of the dominant loculated left lung or pleural collection seen previously with left lower lung pigtail drainage catheter now terminating on series 3, image 136. However, ongoing extensive left perihilar and lower lung consolidation there. Small loculated left pneumothorax or residual empyema along the lung periphery such as series 3, image 81. Overall no significant improvement in left lung ventilation since the prior CT. Central airways remain patent. New small layering right pleural effusion (series 3, image 131). Increased patchy right lower lobe adjacent lung opacity with  redemonstrated 14 mm lung nodule on series 4, image 116. Right upper lobe and right middle lobe are stable. Upper Abdomen: Negative visible noncontrast liver, spleen, pancreas, bowel in the upper abdomen. Stable visible adrenal glands and kidneys. Musculoskeletal: No acute or suspicious osseous lesion identified. Left chest wall edema, trace soft tissue gas following chest tube placement. IMPRESSION: 1. Pigtail left lower lung pleural catheter in place with substantial drainage of the dominant pleural collection/empyema, but extensive ongoing left mid and lower lung Consolidation such that no significant improvement in left lung ventilation. 2. Separate loculated and un-drained left anterior chest/pleural collection/empyema which is inseparable from the mediastinum (see #3) and has an estimated volume of 200 mL (coronal image 99). 3. Small pericardial effusion is apparent now, with epicardial inflammatory stranding near the cardiac apex. Secondary Infectious Pericarditis not excluded. Reactive appearing mediastinal lymph nodes. 4. Small new layering right pleural effusion. Increasing right lower lobe opacity which could be atelectasis or infection. Electronically Signed   By: Odessa Fleming M.D.   On: 02/09/2023 10:59  Medications:     Scheduled Medications:  Chlorhexidine Gluconate Cloth  6 each Topical Q0600   Gerhardt's butt cream   Topical TID   [START ON 02/11/2023] heparin  5,000 Units Subcutaneous Q8H   methocarbamol (ROBAXIN) injection  500 mg Intravenous Q8H   Or   methocarbamol  500 mg Oral Q8H   mupirocin ointment   Nasal BID   oxyCODONE  5 mg Oral Q6H   pantoprazole  40 mg Oral Daily   polyethylene glycol  17 g Oral Daily   sodium chloride flush  10 mL Intrapleural Q8H    Infusions:  amiodarone 30 mg/hr (02/10/23 0900)   ceFEPime (MAXIPIME) IV Stopped (02/10/23 0537)   metronidazole 500 mg (02/10/23 0816)   vancomycin (VANCOCIN) 750 mg in sodium chloride 0.9 % 250 mL IVPB 750 mg  (02/10/23 0956)    PRN Medications: acetaminophen **OR** acetaminophen, docusate sodium, HYDROmorphone (DILAUDID) injection, ketorolac, melatonin, metoprolol tartrate, ondansetron **OR** ondansetron (ZOFRAN) IV, mouth rinse     Assessment/Plan   1. New onset AF with RVR:  - No prior hx of atrial fibrillation.  - Suspect AF with RVR driven by acute illness  - back in NSR currently. Continue amio gtt at 30/hr  - continue to treat acute lung illness - keep K > 4.0 and Mg > 2.0 (labs pending)  - hold off on a/c for now, unless recurrence   2. Acute sepsis/PNA/empyema/Acute Hypoxic Respiratory Failure:  - massive left hydro/pneumo thorax on admit - s/p emergent Lt CT placement w/ > 2L pus out - s/p additional left throacostomy tube placement for residual loculated empyema on 1/23  - On Cefepime + flagyl + Vanc  - Pleural Fluid Cx growing GPR + GPC. Bcx NGTD - WBC 31>>18>>pending. 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 Tobacco use with COPD: - cessation advised   5. Hypokalemia - K 2.9 yesterday, K supp given  - repeat BMP pending    Length of Stay: 2  Robbie Lis, PA-C  02/10/2023, 10:02 AM  Advanced Heart Failure Team Pager [phone] (M-F; 7a - 5p)  Please contact [facility] for night-coverage after hours (5p -7a ) and weekends on [email or web address]   Patient seen and examined with the above-signed Advanced Practice Provider and/or Housestaff. I personally reviewed laboratory data, imaging studies and relevant notes. I independently examined the patient and formulated the important aspects of the plan. I have edited the note to reflect any of my changes or salient points. I have personally discussed the plan with the patient  and/or family.  Remains in NSR on IV amio. No recurrent AF.  Now s/p second CT with IR.   Pleural fluid cultures noted. Seems to be responding well to IV abx.   Breathing better. Still very weak  General:  Weak appearing. No resp difficulty HEENT: normal Neck: supple. no JVD. Carotids 2+ bilat; no bruits. No lymphadenopathy or thryomegaly appreciated. Cor: PMI nondisplaced. Regular rate & rhythm. No rubs, gallops or murmurs. Lungs: coarse on left CT x 2 Abdomen: soft, nontender, nondistended. No hepatosplenomegaly. No bruits or masses. Good bowel sounds. Extremities: no cyanosis, clubbing, rash, edema Neuro: alert & orientedx3, cranial nerves grossly intact. moves all 4 extremities w/o difficulty. Affect pleasant  Can switch IV amio to amio 200 bid. No AC unless AF recurs. Can stop amio prior to d/c  Add low-dose GDMT as BP tolerates  Can go to floor with [facility] follow up. D/w CCM at bedside.  Arvilla Meres, MD  11:05 AM

## 2023-02-10 NOTE — Progress Notes (Signed)
[facility] DAILY ICU PROGRESS NOTE                   [address]            [place] [address]          [phone]       Total Length of Stay:  LOS: 2 days   Subjective: Patient sitting in chair and would like to go back to bed.  Objective: Vital signs in last 24 hours: Temp:  [97.1 F (36.2 C)-98.8 F (37.1 C)] 97.7 F (36.5 C) (01/23 0400) Pulse Rate:  [74-97] 84 (01/23 0600) Cardiac Rhythm: Normal sinus rhythm (01/22 2100) Resp:  [16-29] 20 (01/23 0600) BP: (95-126)/(50-100) 109/50 (01/23 0600) SpO2:  [84 %-100 %] 98 % (01/23 0600)  Filed Weights   02/09/23 0600  Weight: 80.1 kg    Intake/Output from previous day: 01/22 0701 - 01/23 0700 In: 2486.1 [P.O.:240; I.V.:1069; IV Piggyback:1167.2] Out: 955 [Urine:675; Chest Tube:280]  Intake/Output this shift: Total I/O In: 757.4 [I.V.:175; IV Piggyback:582.4] Out: 405 [Urine:275; Chest Tube:130]  Current Meds: Scheduled Meds:  Chlorhexidine Gluconate Cloth  6 each Topical Q0600   Gerhardt's butt cream   Topical TID   [START ON 02/11/2023] heparin  5,000 Units Subcutaneous Q8H   methocarbamol (ROBAXIN) injection  500 mg Intravenous Q8H   Or   methocarbamol  500 mg Oral Q8H   mupirocin ointment   Nasal BID   oxyCODONE  5 mg Oral Q6H   pantoprazole  40 mg Oral Daily   polyethylene glycol  17 g Oral Daily   sodium chloride flush  10 mL Intrapleural Q8H   Continuous Infusions:  amiodarone 30 mg/hr (02/10/23 0600)   ceFEPime (MAXIPIME) IV Stopped (02/10/23 0537)   metronidazole Stopped (02/10/23 0313)   vancomycin (VANCOCIN) 750 mg in sodium chloride 0.9 % 250 mL IVPB Stopped (02/09/23 2333)   PRN Meds:.acetaminophen **OR** acetaminophen, docusate sodium, HYDROmorphone (DILAUDID) injection, ketorolac, melatonin, metoprolol tartrate, ondansetron **OR** ondansetron (ZOFRAN) IV, mouth rinse  General appearance: alert, cooperative, and no distress Neurologic: intact Heart: RRR Lungs: Diminished breath  sounds on left Extremities:No LE edema Wound: Dressing clean, dry Left chest tube: to suction  Lab Results: CBC: Recent Labs    02/08/23 0946 02/09/23 0318  WBC 31.0* 18.5*  HGB 9.8* 10.2*  HCT 30.7* 32.0*  PLT 336 268   BMET:  Recent Labs    02/08/23 0238 02/08/23 0946 02/09/23 0318  NA 134*  --  138  K 3.8  --  2.9*  CL 100  --  108  CO2 25  --  25  GLUCOSE 153*  --  135*  BUN 19  --  18  CREATININE 0.82 0.72 0.58*  CALCIUM 8.2*  --  7.9*    CMET: Lab Results  Component Value Date   WBC 18.5 (H) 02/09/2023   HGB 10.2 (L) 02/09/2023   HCT 32.0 (L) 02/09/2023   PLT 268 02/09/2023   GLUCOSE 135 (H) 02/09/2023   CHOL 131 09/07/2006   TRIG 246 (HH) 09/07/2006   HDL 48.3 09/07/2006   LDLDIRECT 55.3 09/07/2006   ALT 26 02/09/2023   AST 44 (H) 02/09/2023   NA 138 02/09/2023   K 2.9 (L) 02/09/2023   CL 108 02/09/2023   CREATININE 0.58 (L) 02/09/2023   BUN 18 02/09/2023   CO2 25 02/09/2023   TSH 0.80 09/07/2006   PSA 0.58 09/07/2006   INR 1.5 (H) 02/10/2023  PT/INR:  Recent Labs    02/10/23 0225  LABPROT 18.7*  INR 1.5*   Radiology: DG CHEST PORT 1 VIEW Result Date: 02/09/2023 CLINICAL DATA:  63 year old male with left hydropneumothorax, loculation, empyema. Chest tube placement. EXAM: PORTABLE CHEST 1 VIEW COMPARISON:  Chest CT 1035 hours today reported separately. FINDINGS: Portable AP semi upright view at 0736 hours. Pigtail left chest tube appears stable since yesterday. Regression of the post drainage left lower lung pleural air since yesterday. Otherwise on going veiling and confluent left mid and lower lung opacity. See additional details on CT. Right lung ventilation not significantly changed. Mediastinal contour stable. Visualized tracheal air column is within normal limits. No acute osseous abnormality identified. IMPRESSION: 1. Regression of post drainage left lower pleural air since yesterday. Stable left chest tube. 2. Otherwise stable chest,  see additional details on CT today reported just now. Electronically Signed   By: Odessa Fleming M.D.   On: 02/09/2023 11:01   CT CHEST WO CONTRAST Result Date: 02/09/2023 CLINICAL DATA:  63 year old male with left hydropneumothorax, loculation, empyema. Chest tube placement. EXAM: CT CHEST WITHOUT CONTRAST TECHNIQUE: Multidetector CT imaging of the chest was performed following the standard protocol without IV contrast. RADIATION DOSE REDUCTION: This exam was performed according to the departmental dose-optimization program which includes automated exposure control, adjustment of the mA and/or kV according to patient size and/or use of iterative reconstruction technique. COMPARISON:  Portable chest radiographs yesterday, CT 02/07/2023. FINDINGS: Cardiovascular: Vascular patency is not evaluated in the absence of IV contrast. Heart size remains within normal limits. There is a small pericardial effusion now suspected on series 3, image 132. Mild Calcified aortic atherosclerosis. Mediastinum/Nodes: Abnormal medial left anterior lung/pleural collection inseparable from the mediastinum including along the main pulmonary artery (series 3, image 93) further detailed below. No separate mediastinal mass. Mediastinal lymph nodes appear stable and reactive. But furthermore, there is moderate epicardial and superior diaphragmatic soft tissue stranding near the cardiac apex on series 3, image 126. Lungs/Pleura: Oval loculated lung or pleural collection along the anterior upper lobe contiguous with the mediastinum has trace internal gas (series 3, image 93 and otherwise measures fluid density encompassing 78 x 48 by 112 mm now (AP by transverse by CC) for an estimated volume of 200 mL (coronal image 99) this appears slightly larger since 02/07/2023. Interval subtotal drainage of the dominant loculated left lung or pleural collection seen previously with left lower lung pigtail drainage catheter now terminating on series 3, image  136. However, ongoing extensive left perihilar and lower lung consolidation there. Small loculated left pneumothorax or residual empyema along the lung periphery such as series 3, image 81. Overall no significant improvement in left lung ventilation since the prior CT. Central airways remain patent. New small layering right pleural effusion (series 3, image 131). Increased patchy right lower lobe adjacent lung opacity with redemonstrated 14 mm lung nodule on series 4, image 116. Right upper lobe and right middle lobe are stable. Upper Abdomen: Negative visible noncontrast liver, spleen, pancreas, bowel in the upper abdomen. Stable visible adrenal glands and kidneys. Musculoskeletal: No acute or suspicious osseous lesion identified. Left chest wall edema, trace soft tissue gas following chest tube placement. IMPRESSION: 1. Pigtail left lower lung pleural catheter in place with substantial drainage of the dominant pleural collection/empyema, but extensive ongoing left mid and lower lung Consolidation such that no significant improvement in left lung ventilation. 2. Separate loculated and un-drained left anterior chest/pleural collection/empyema which is inseparable from the mediastinum (  see #3) and has an estimated volume of 200 mL (coronal image 99). 3. Small pericardial effusion is apparent now, with epicardial inflammatory stranding near the cardiac apex. Secondary Infectious Pericarditis not excluded. Reactive appearing mediastinal lymph nodes. 4. Small new layering right pleural effusion. Increasing right lower lobe opacity which could be atelectasis or infection. Electronically Signed   By: Odessa Fleming M.D.   On: 02/09/2023 10:59     Assessment/Plan: CV-Previous new onset a fib with RVR. SR this am. On Amiodarone drip. Echo done yesterday showed LV with global hypokinesis, trivial MR, small pericardial effusion Pulmonary-History of COPD, ongoing tobacco abuse. He is on 6 liters of oxygen via [place]. Left chest  tube with 280 last 24 hours. Left chest tube is to suction. Thrombolytics placed 1/21. IR to place left anterior chest tube today 3. ID-on Cefepime, Vancomycin, and Flagyl for PNA/left empyema. Gram stain of fluid showed abundant gram negative rods and positive cocci. Culture shows no growth less than 24 hours. WBC yesterday down to 18,500. Blood cultures show no growth less than 24 hours.   Breylen Agyeman Margaretann Loveless PA-C 02/10/2023 6:54 AM

## 2023-02-10 NOTE — Plan of Care (Signed)

## 2023-02-10 NOTE — Progress Notes (Addendum)
PROGRESS NOTE  Mathew Wyatt WNU:[id number] DOB: January 23, 1960 DOA: 02/08/2023 PCP: Patient, No Pcp Per   LOS: 2 days   Brief narrative:  Mathew Wyatt is a 63 y.o. male with past medical history of COPD and a smoker who had initially presented to [facility] hospital on 02/07/2023 with shortness of breath dyspnea on exertion with productive cough.  He was noted to be tachypneic with significant leukocytosis and elevated lactate.  Patient was started on IV antibiotic.  CT scan of the chest however was concerning for empyema versus abscess so he was transferred to [facility] on 02/08/2023 for possible VATS.  Patient however went into A-fib with RVR and hypotension and was admitted to ICU service.  PCCM placed a left-sided chest tube on 02/08/2023 and IR was subsequently consulted to put the second chest tube in the anterior pleural space for additional fluid drainage.  At this time, patient has been transferred out of the ICU.    Assessment/Plan: Principal Problem:   Sepsis due to pneumonia [facility]) Active Problems:   Empyema (HCC)   Sepsis associated hypotension (HCC)  Sepsis secondary to pneumonia with left-sided empyema leading to acute hypoxic respiratory failure..  Status post chest tube placement.  On vancomycin, cefepime and Flagyl.  Pleural fluid growing gram-positive rods and gram-positive cocci.  Blood cultures negative so far.  WBC has trended down from 31K-18K...  CT surgery following.  Continue pain management.  Postthrombolytic placement on 02/08/2023.  Second chest tube placement by IR 02/10/2023.  Follow pulmonary recommendations.  New onset atrial fibrillation with RVR.  On amiodarone drip, cardiology following.  Continue electrolyte monitoring and supplementation as needed.  Rate controlled at this time.  Acute systolic congestive heart failure with moderately reduced ejection fraction.  2D echocardiogram 45 to 50% with global hypokinesis.  Likely tachycardia mediated heart failure in the  setting of A-fib.  Cardiology following.  History of COPD smoker.  Continue nebulizers.  DVT prophylaxis: heparin injection 5,000 Units Start: 02/11/23 0600 SCDs Start: 02/08/23 0845 SCDs Start: 02/08/23 0305   Disposition: Home, uncertain at this time.  Status is: Inpatient Remains inpatient appropriate because: IV antibiotics chest tubes x 2, empyema, pending clinical improvement    Code Status:     Code Status: Full Code  Family Communication: None at bedside  Consultants: CT surgery PCCM Interventional radiology  Procedures: Left-sided chest tube placement x 2.  Anti-infectives:  Vanco cefepime and metronidazole  Anti-infectives (From admission, onward)    Start     Dose/Rate Route Frequency Ordered Stop   02/08/23 1730  metroNIDAZOLE (FLAGYL) IVPB 500 mg        500 mg 100 mL/hr over 60 Minutes Intravenous Every 8 hours 02/08/23 1637     02/08/23 0400  vancomycin (VANCOREADY) IVPB 750 mg/150 mL  Status:  Discontinued        750 mg 150 mL/hr over 60 Minutes Intravenous Every 12 hours 02/08/23 0324 02/08/23 0328   02/08/23 0400  ceFEPIme (MAXIPIME) 2 g in sodium chloride 0.9 % 100 mL IVPB        2 g 200 mL/hr over 30 Minutes Intravenous Every 8 hours 02/08/23 0324     02/08/23 0400  vancomycin (VANCOCIN) 750 mg in sodium chloride 0.9 % 250 mL IVPB        750 mg 250 mL/hr over 60 Minutes Intravenous Every 12 hours 02/08/23 0328         Subjective: Today, patient was seen and examined at bedside.  Complains of sharp  chest pain, seen after second chest tube placement by radiology today.  States that his breathing is better.  Denies any nausea vomiting abdominal pain.  Objective: Vitals:   02/10/23 1000 02/10/23 1100  BP: 139/64   Pulse: 88   Resp:    Temp:  98.8 F (37.1 C)  SpO2: 95%     Intake/Output Summary (Last 24 hours) at 02/10/2023 1152 Last data filed at 02/10/2023 1050 Gross per 24 hour  Intake 2246.13 ml  Output 1655 ml  Net 591.13 ml    Filed Weights   02/09/23 0600  Weight: 80.1 kg   Body mass index is 23.95 kg/m.   Physical Exam:  GENERAL: Patient is alert awake and oriented. Not in obvious distress.  On nasal cannula oxygen appears chronically ill  HENT: No scleral pallor or icterus. Pupils equally reactive to light. Oral mucosa is moist NECK: is supple, no gross swelling noted. CHEST: Left chest wall with chest tubes x 2.  Decreased breath sounds bilaterally.  Coarse breath sounds noted. CVS: S1 and S2 heard, no murmur. Regular rate and rhythm.  ABDOMEN: Soft, non-tender, bowel sounds are present. EXTREMITIES: No edema. CNS: Cranial nerves are intact. No focal motor deficits. SKIN: warm and dry without rashes.  Data Review: I have personally reviewed the following laboratory data and studies,  CBC: Recent Labs  Lab 02/07/23 1528 02/08/23 0238 02/08/23 0946 02/09/23 0318  WBC 35.7* 26.5* 31.0* 18.5*  NEUTROABS 32.3* 23.8*  --   --   HGB 11.3* 10.3* 9.8* 10.2*  HCT 35.3* 31.6* 30.7* 32.0*  MCV 93.9 93.8 94.8 93.6  PLT 396 300 336 268   Basic Metabolic Panel: Recent Labs  Lab 02/07/23 1528 02/08/23 0238 02/08/23 0946 02/09/23 0318  NA 134* 134*  --  138  K 4.2 3.8  --  2.9*  CL 93* 100  --  108  CO2 24 25  --  25  GLUCOSE 237* 153*  --  135*  BUN 25* 19  --  18  CREATININE 0.92 0.82 0.72 0.58*  CALCIUM 8.9 8.2*  --  7.9*  MG  --   --   --  1.9  PHOS  --   --   --  2.5   Liver Function Tests: Recent Labs  Lab 02/07/23 1528 02/09/23 0318  AST 33 44*  ALT 22 26  ALKPHOS 157* 117  BILITOT 2.1* 1.2  PROT 8.0 5.6*  ALBUMIN 1.8* <1.5*   Recent Labs  Lab 02/07/23 1528  LIPASE 17   No results for input(s): "AMMONIA" in the last 168 hours. Cardiac Enzymes: No results for input(s): "CKTOTAL", "CKMB", "CKMBINDEX", "TROPONINI" in the last 168 hours. BNP (last 3 results) No results for input(s): "BNP" in the last 8760 hours.  ProBNP (last 3 results) No results for input(s):  "PROBNP" in the last 8760 hours.  CBG: No results for input(s): "GLUCAP" in the last 168 hours. Recent Results (from the past 240 hours)  Culture, blood (single)     Status: None (Preliminary result)   Collection Time: 02/07/23  5:31 PM   Specimen: BLOOD  Result Value Ref Range Status   Specimen Description BLOOD BLOOD LEFT ARM  Final   Special Requests   Final    BOTTLES DRAWN AEROBIC AND ANAEROBIC Blood Culture results may not be optimal due to an inadequate volume of blood received in culture bottles   Culture   Final    NO GROWTH 3 DAYS Performed at [facility], [address] [place], [place] [address]    Report Status PENDING  Incomplete  MRSA Next Gen by PCR, Nasal     Status: Abnormal   Collection Time: 02/08/23  9:34 AM   Specimen: Nasal Mucosa; Nasal Swab  Result Value Ref Range Status   MRSA by PCR Next Gen DETECTED (A) NOT DETECTED Final    Comment: RESULT CALLED TO, READ BACK BY AND VERIFIED WITH: RN christine h. [id number] fcp (NOTE) The GeneXpert MRSA Assay (FDA approved for NASAL specimens only), is one component of a comprehensive MRSA colonization surveillance program. It is not intended to diagnose MRSA infection nor to guide or monitor treatment for MRSA infections. Test performance is not FDA approved in patients less than 19 years old. Performed at [facility], [address] [place], [place] [address]   Culture, blood (Routine X 2) w Reflex to ID Panel     Status: None (Preliminary result)   Collection Time: 02/08/23  9:46 AM   Specimen: BLOOD LEFT HAND  Result Value Ref Range Status   Specimen Description BLOOD LEFT HAND  Final   Special Requests   Final    BOTTLES DRAWN AEROBIC AND ANAEROBIC Blood Culture results may not be optimal due to an inadequate volume of blood received in culture bottles   Culture   Final    NO GROWTH < 24 HOURS Performed at [facility], [address], [place], [place] [address]    Report Status  PENDING  Incomplete  Culture, blood (Routine X 2) w Reflex to ID Panel     Status: None (Preliminary result)   Collection Time: 02/08/23  9:54 AM   Specimen: BLOOD LEFT HAND  Result Value Ref Range Status   Specimen Description BLOOD LEFT HAND  Final   Special Requests   Final    BOTTLES DRAWN AEROBIC AND ANAEROBIC Blood Culture results may not be optimal due to an inadequate volume of blood received in culture bottles   Culture   Final    NO GROWTH < 24 HOURS Performed at [facility], [address] [place], [place] [address]    Report Status PENDING  Incomplete  Body fluid culture w Gram Stain     Status: None   Collection Time: 02/08/23 11:04 AM   Specimen: Pleural Fluid  Result Value Ref Range Status   Specimen Description PLEURAL  Final   Special Requests NONE  Final   Gram Stain   Final    ABUNDANT WBC PRESENT, PREDOMINANTLY PMN ABUNDANT GRAM NEGATIVE RODS ABUNDANT GRAM POSITIVE COCCI IN PAIRS IN CHAINS Gram Stain Report Called to,Read Back By and Verified With: C. HICKLING [id number] @ 1519 FH Performed at [facility], [address], [place], [place] [address]    Culture   Final    MIXED ANAEROBIC FLORA PRESENT.  CALL LAB IF FURTHER IID REQUIRED.   Report Status 02/10/2023 FINAL  Final     Studies: CT PERC PLEURAL DRAIN W/INDWELL CATH W/IMG GUIDE Result Date: 02/10/2023 INDICATION: 63 year old male with history of complex empyema with loculated component in the anterior left chest. EXAM: CT PERC PLEURAL DRAIN W/INDWELL CATH W/IMG GUIDE COMPARISON:  None Available. MEDICATIONS: The patient is currently admitted to the hospital and receiving intravenous antibiotics. The antibiotics were administered within an appropriate time frame prior to the initiation of the procedure. ANESTHESIA/SEDATION: Moderate (conscious) sedation was employed during this procedure. A total of Versed mg and Fentanyl mcg was administered  intravenously. Moderate Sedation Time: minutes. The  patient's level of consciousness and vital signs were monitored continuously by radiology nursing throughout the procedure under my direct supervision. CONTRAST:  None COMPLICATIONS: None immediate. PROCEDURE: RADIATION DOSE REDUCTION: This exam was performed according to the departmental dose-optimization program which includes automated exposure control, adjustment of the mA and/or kV according to patient size and/or use of iterative reconstruction technique. Informed written consent was obtained from the patient after a discussion of the risks, benefits and alternatives to treatment. The patient was placed supine on the CT gantry and a pre procedural CT was performed re-demonstrating the known fluid collection within the anterior left pleural space. The procedure was planned. A timeout was performed prior to the initiation of the procedure. The left chest was prepped and draped in the usual sterile fashion. The overlying soft tissues were anesthetized with 1% lidocaine with epinephrine. Appropriate trajectory was planned with the use of a 22 gauge spinal needle. An 18 gauge trocar needle was advanced into the pleural space and a short Amplatz super stiff wire was coiled within the collection. Appropriate positioning was confirmed with a limited CT scan. The tract was serially dilated allowing placement of a 14 French all-purpose drainage catheter. Appropriate positioning was confirmed with a limited postprocedural CT scan. A total of approximately 100 ml of purulent fluid was aspirated. Samples were sent to the lab for fluid analysis. The tube was connected to a pleurovac and sutured in place. A dressing was placed. The patient tolerated the procedure well without immediate post procedural complication. IMPRESSION: Successful CT guided placement of a 56 French all purpose drain catheter into the left anterior loculated empyema with immediate aspiration of approximately 100 mL purulent fluid. Samples were sent to  the laboratory as requested by the ordering clinical team. Marliss Coots, MD [facility] Radiology Electronically Signed   By: Marliss Coots M.D.   On: 02/10/2023 10:34   DG CHEST PORT 1 VIEW Result Date: 02/09/2023 CLINICAL DATA:  63 year old male with left hydropneumothorax, loculation, empyema. Chest tube placement. EXAM: PORTABLE CHEST 1 VIEW COMPARISON:  Chest CT 1035 hours today reported separately. FINDINGS: Portable AP semi upright view at 0736 hours. Pigtail left chest tube appears stable since yesterday. Regression of the post drainage left lower lung pleural air since yesterday. Otherwise on going veiling and confluent left mid and lower lung opacity. See additional details on CT. Right lung ventilation not significantly changed. Mediastinal contour stable. Visualized tracheal air column is within normal limits. No acute osseous abnormality identified. IMPRESSION: 1. Regression of post drainage left lower pleural air since yesterday. Stable left chest tube. 2. Otherwise stable chest, see additional details on CT today reported just now. Electronically Signed   By: Odessa Fleming M.D.   On: 02/09/2023 11:01   CT CHEST WO CONTRAST Result Date: 02/09/2023 CLINICAL DATA:  63 year old male with left hydropneumothorax, loculation, empyema. Chest tube placement. EXAM: CT CHEST WITHOUT CONTRAST TECHNIQUE: Multidetector CT imaging of the chest was performed following the standard protocol without IV contrast. RADIATION DOSE REDUCTION: This exam was performed according to the departmental dose-optimization program which includes automated exposure control, adjustment of the mA and/or kV according to patient size and/or use of iterative reconstruction technique. COMPARISON:  Portable chest radiographs yesterday, CT 02/07/2023. FINDINGS: Cardiovascular: Vascular patency is not evaluated in the absence of IV contrast. Heart size remains within normal limits. There is a  small pericardial effusion now suspected on series 3, image 132. Mild  Calcified aortic atherosclerosis. Mediastinum/Nodes: Abnormal medial left anterior lung/pleural collection inseparable from the mediastinum including along the main pulmonary artery (series 3, image 93) further detailed below. No separate mediastinal mass. Mediastinal lymph nodes appear stable and reactive. But furthermore, there is moderate epicardial and superior diaphragmatic soft tissue stranding near the cardiac apex on series 3, image 126. Lungs/Pleura: Oval loculated lung or pleural collection along the anterior upper lobe contiguous with the mediastinum has trace internal gas (series 3, image 93 and otherwise measures fluid density encompassing 78 x 48 by 112 mm now (AP by transverse by CC) for an estimated volume of 200 mL (coronal image 99) this appears slightly larger since 02/07/2023. Interval subtotal drainage of the dominant loculated left lung or pleural collection seen previously with left lower lung pigtail drainage catheter now terminating on series 3, image 136. However, ongoing extensive left perihilar and lower lung consolidation there. Small loculated left pneumothorax or residual empyema along the lung periphery such as series 3, image 81. Overall no significant improvement in left lung ventilation since the prior CT. Central airways remain patent. New small layering right pleural effusion (series 3, image 131). Increased patchy right lower lobe adjacent lung opacity with redemonstrated 14 mm lung nodule on series 4, image 116. Right upper lobe and right middle lobe are stable. Upper Abdomen: Negative visible noncontrast liver, spleen, pancreas, bowel in the upper abdomen. Stable visible adrenal glands and kidneys. Musculoskeletal: No acute or suspicious osseous lesion identified. Left chest wall edema, trace soft tissue gas following chest tube placement. IMPRESSION: 1. Pigtail left lower lung pleural catheter in place with  substantial drainage of the dominant pleural collection/empyema, but extensive ongoing left mid and lower lung Consolidation such that no significant improvement in left lung ventilation. 2. Separate loculated and un-drained left anterior chest/pleural collection/empyema which is inseparable from the mediastinum (see #3) and has an estimated volume of 200 mL (coronal image 99). 3. Small pericardial effusion is apparent now, with epicardial inflammatory stranding near the cardiac apex. Secondary Infectious Pericarditis not excluded. Reactive appearing mediastinal lymph nodes. 4. Small new layering right pleural effusion. Increasing right lower lobe opacity which could be atelectasis or infection. Electronically Signed   By: Odessa Fleming M.D.   On: 02/09/2023 10:59   ECHOCARDIOGRAM COMPLETE Result Date: 02/08/2023    ECHOCARDIOGRAM REPORT   Patient Name:   KYMARI ANDREN Date of Exam: 02/08/2023 Medical Rec #:  [id number]    Height:       72.0 in Accession #:    [id number]   Weight:       149.9 lb Date of Birth:  1960/05/24     BSA:          1.885 m Patient Age:    62 years     BP:           106/69 mmHg Patient Gender: M            HR:           75 bpm. Exam Location:  Inpatient Procedure: 2D Echo, Color Doppler and Cardiac Doppler Indications:    atrial fibrillation  History:        Patient has no prior history of Echocardiogram examinations.                 COPD and Sepsis; Risk Factors:Current Smoker.  Sonographer:    Delcie Roch RDCS Referring Phys: [id number] DANIEL R BENSIMHON IMPRESSIONS  1. Left ventricular ejection fraction, by estimation, is 45 to  50%. The left ventricle has mildly decreased function. The left ventricle demonstrates global hypokinesis. Left ventricular diastolic parameters were normal. The average left ventricular global longitudinal strain is -12.9 %. The global longitudinal strain is abnormal.  2. Right ventricular systolic function is normal. The right ventricular size is mildly enlarged.  Tricuspid regurgitation signal is inadequate for assessing PA pressure.  3. There is a small pericardial effusion, mostly around the LV apex and lateral wall, in the immediate vicinity of an area of complex loculated small left pleural effusion. There appears to be exaggerated respiratory displacement of the ventricular septum, suggesting ventricular interdependence. Consider a component of constrictive physiology. a small pericardial effusion is present. There is no evidence of cardiac tamponade.  4. The mitral valve is normal in structure. Trivial mitral valve regurgitation. No evidence of mitral stenosis.  5. The aortic valve is tricuspid. Aortic valve regurgitation is not visualized. No aortic stenosis is present.  6. The inferior vena cava is dilated in size with >50% respiratory variability, suggesting right atrial pressure of 8 mmHg. FINDINGS  Left Ventricle: Left ventricular ejection fraction, by estimation, is 45 to 50%. The left ventricle has mildly decreased function. The left ventricle demonstrates global hypokinesis. The average left ventricular global longitudinal strain is -12.9 %. The global longitudinal strain is abnormal. The left ventricular internal cavity size was normal in size. There is no left ventricular hypertrophy. Left ventricular diastolic parameters were normal. Normal left ventricular filling pressure. Right Ventricle: The right ventricular size is mildly enlarged. No increase in right ventricular wall thickness. Right ventricular systolic function is normal. Tricuspid regurgitation signal is inadequate for assessing PA pressure. Left Atrium: Left atrial size was normal in size. Right Atrium: Right atrial size was normal in size. Pericardium: There is a small pericardial effusion, mostly around the LV apex and lateral wall, in the immediate vicinity of an area of complex loculated small left pleural effusion. There appears to be exaggerated respiratory displacement of the ventricular  septum, suggesting ventricular interdependence. Consider a component of constrictive physiology. A small pericardial effusion is present. There is no evidence of cardiac tamponade. Mitral Valve: The mitral valve is normal in structure. Trivial mitral valve regurgitation. No evidence of mitral valve stenosis. Tricuspid Valve: The tricuspid valve is normal in structure. Tricuspid valve regurgitation is not demonstrated. Aortic Valve: The aortic valve is tricuspid. Aortic valve regurgitation is not visualized. No aortic stenosis is present. Pulmonic Valve: The pulmonic valve was grossly normal. Pulmonic valve regurgitation is not visualized. No evidence of pulmonic stenosis. Aorta: The aortic root and ascending aorta are structurally normal, with no evidence of dilitation. Venous: The inferior vena cava is dilated in size with greater than 50% respiratory variability, suggesting right atrial pressure of 8 mmHg. IAS/Shunts: No atrial level shunt detected by color flow Doppler.  LEFT VENTRICLE PLAX 2D LVIDd:         4.30 cm   Diastology LVIDs:         3.40 cm   LV e' medial:    8.59 cm/s LV PW:         1.00 cm   LV E/e' medial:  9.0 LV IVS:        0.90 cm   LV e' lateral:   8.81 cm/s LVOT diam:     2.20 cm   LV E/e' lateral: 8.8 LV SV:         71 LV SV Index:   38        2D Longitudinal  Strain LVOT Area:     3.80 cm  2D Strain GLS Avg:     -12.9 %  RIGHT VENTRICLE             IVC RV Basal diam:  3.00 cm     IVC diam: 2.30 cm RV S prime:     13.10 cm/s TAPSE (M-mode): 1.8 cm LEFT ATRIUM             Index        RIGHT ATRIUM           Index LA diam:        2.60 cm 1.38 cm/m   RA Area:     16.70 cm LA Vol (A2C):   61.9 ml 32.84 ml/m  RA Volume:   47.30 ml  25.09 ml/m LA Vol (A4C):   50.7 ml 26.90 ml/m LA Biplane Vol: 57.6 ml 30.56 ml/m  AORTIC VALVE LVOT Vmax:   106.00 cm/s LVOT Vmean:  66.100 cm/s LVOT VTI:    0.187 m  AORTA Ao Root diam: 3.50 cm Ao Asc diam:  3.40 cm MITRAL VALVE MV Area (PHT): 3.85 cm    SHUNTS  MV Decel Time: 197 msec    Systemic VTI:  0.19 m MV E velocity: 77.70 cm/s  Systemic Diam: 2.20 cm MV A velocity: 79.30 cm/s MV E/A ratio:  0.98 Mihai Croitoru MD Electronically signed by Thurmon Fair MD Signature Date/Time: 02/08/2023/4:53:23 PM    Final       Joycelyn Das, MD  [facility] 02/10/2023  If 7PM-7AM, please contact night-coverage

## 2023-02-10 NOTE — Progress Notes (Signed)
NAME:  Coven Dyce, MRN:  [id number], DOB:  [birth year], LOS: 2 ADMISSION DATE:  02/08/2023, CONSULTATION DATE: 02/08/2023  REFERRING MD: Add, CHIEF COMPLAINT: Hypotension, atrial fibrillation, large empyema on left with hydropneumothorax  History of Present Illness:  63 year old male with past medical history of tobacco use, COPD who presented to [facility] on 1/20 with cough and shortness of breath. Reportedly having worsening symptoms over the preceding 2 weeks. He denied having fever, chills, chest pain, n/v/d. He was found to be tachypneic with significant leukocytosis to 35.7, lactifc 4.4. he was started on IV antibiotics. He had CT chest which demonstrated a large left-sided loculated hydropneumothorax concerning for empyema or abscess. He was transferred to [facility] on 1/21 initially to hospitalist for CT surgery consult and possible VATS. On 1/21 he developed atrial fibrillation with RVR and hypotension and was transferred to [facility] and admitted to ICU.   Pertinent  Medical History  History reviewed. No pertinent past medical history.   Significant Hospital Events: Including procedures, antibiotic start and stop dates in addition to other pertinent events   1/20: [facility] ED with SOB. CT with large left hydropneumo c/w empyema vs abscess.  1/21: TCTS defer VATS. ICU for afib rvr hypotension, started on amiodarone. CT placed with immediate 2L purulent output. Initial tube lytics with additional 300cc output.  1/22: repeat CT chest today to eval for need of additional CT vs more lytics vs observation.   Interim History / Subjective:  NAEON. Had new anterior chest tube placed by IR this morning.  Objective   Blood pressure 139/64, pulse 88, temperature 97.7 F (36.5 C), temperature source Oral, resp. rate (!) 28, weight 80.1 kg, SpO2 95%.        Intake/Output Summary (Last 24 hours) at 02/10/2023 1028 Last data filed at 02/10/2023 0600 Gross per 24 hour  Intake 2246.13 ml  Output 935 ml  Net  1311.13 ml   Filed Weights   02/09/23 0600  Weight: 80.1 kg    Examination: General: acute on chronically ill appearing male, laying in bed, no acute distress  HENT: [place]/AT, anicteric sclera, perrla, mmm, nasal cannula 3L Lungs: rhonchi and diminished L, clear right. 2 chest tubes on left Cardiovascular: RRR, no JVD or peripheral edema  Abdomen: flat, soft, non-tender Extremities: Warm and dry Neuro: alert and oriented, non focal exam GU: no foley    Assessment & Plan:   Sepsis 2/2 left empyema  Acute hypoxic respiratory failure 2/2 empyema + hydroPTX s/p chest tube with new chest tube placed 1/23 by IR - pleural cultures growing GPR and GPC Clear respiratory source. Based on recent 2 week history of cough, sob, may have started as pna and developed empyema. Denies alcohol use or chronic dysphagia. Significant leukocytosis to 35. Lactic 4.4. CT chest with large left sided hydropneumothorax consistent with empyema. 1/21 CT placed with 2L out. Improving wbc. Echo without vegetation.  New 2nd anterior chest tube placed by IR under CT guidance on 1/23. - Cont chest tubes to suction - con't broad antibiotics with vanc/cefepime/flagyl  - f/u pleural fluid studies until speciation (new culture sent 1/23 from new chest tube) - supplemental O2 to maintain SpO2 >90% - Continue pain control, Oxy, Robaxin, Dilaudid, Toradol  Atrial fibrillation with RVR, new - resolved with Amio and now NSR HFmrEF - 2/2 above Likely related to acute illness. No previous history. Not anticoagulated PTA. - con't amio gtt for now, consider converting to oral soon - no need for [facility] currently - cardiology  following, appreciate rec - tele monitoring - F/u echo as outpatient after recovers from acute illness  History of tobacco abuse - Smoking cessation  Hypokalemia - s/p repletion - Repeat BMP pending today   Stable to transfer to progressive given Amio drip.  PCCM to follow for chest tube.  Best Practice  (right click and "Reselect all SmartList Selections" daily)   Diet/type: Regular consistency (see orders) DVT prophylaxis prophylactic heparin  Pressure ulcer(s): N/A GI prophylaxis: PPI Lines: N/A Foley:  N/A Code Status:  full code Last date of multidisciplinary goals of care discussion [patient updated on plan of care at bedside]    Rutherford Guys, PA - [facility] [place] [facility] For pager details, please see AMION or use Epic chat  After 1900, please call [facility] for cross coverage needs 02/10/2023, 10:56 AM

## 2023-02-10 NOTE — Procedures (Signed)
Interventional Radiology Procedure Note  Procedure: CT guided left throacostomy tube placement  Findings: Please refer to procedural dictation for full description. 14 Fr pigtail via left mid anterior chest approach into loculated empyema.  Approximately 100 mL thick, yellow fluid aspirated immediately upon connection to pleurevac.  Sample sent for culture.    Complications: None immediate  Estimated Blood Loss: < 5 ml  Recommendations: Follow culture. Tube management as per PCCM.   IR will follow.   Marliss Coots, MD

## 2023-02-10 NOTE — TOC Progression Note (Signed)
Transition of Care [facility] [address]) - Progression Note    Patient Details  Name: Mathew Wyatt MRN: [id number] Date of Birth: 08-03-1960  Transition of Care [facility]) CM/SW Contact  Nicanor Bake Phone Number: [phone] 02/10/2023, 2:28 PM  Clinical Narrative:   1:45 PM- HF CSW attempted to meet wit pt at bedside. Pt was being seen in room by staff. CSW will follow up at a more appropriate time.   TOC will continue following.          Expected Discharge Plan and Services                                               Social Determinants of Health (SDOH) Interventions SDOH Screenings   Tobacco Use: High Risk (02/08/2023)    Readmission Risk Interventions     No data to display

## 2023-02-10 NOTE — Hospital Course (Addendum)
Mathew Wyatt was admitted to the hospital with the working diagnosis of empyema.    63 y.o. male with past medical history of COPD and a smoker who had initially presented to [facility] hospital on 02/07/2023 with shortness of breath dyspnea on exertion with productive cough.   He was noted to be tachypneic with significant leukocytosis and elevated lactate.   Patient was started on IV antibiotic.  CT scan of the chest however was concerning for empyema versus abscess.  02/08/23 Transferred to [facility] for further evaluation.    Patient developed A-fib with RVR and hypotension and was admitted to ICU service.   1/21: TCTS defer VATS. ICU for afib rvr hypotension, started on amiodarone. CT placed with immediate 2L purulent output. Initial tube lytics with additional 300cc output. ECHO neg for vegetation, EF 45-50% 1/22: repeat CT chest 1/23 new chest tube placed by IR, cultures growing Campylobacter and Peptostreptococcus 1/26 dec'd pleural output but has persistent loculated fluid collection  1/27 AF w/ RVR. Treated w/ BB. Seen by ID; vanc stopped, switched to ceftriaxone, flagyl and azith for campylobacter. CT chest obtained. The anterior fluid collection has improved. The lateral effusion vol is less but loculated air maintains. Now has Right layering effusion, and new patchy areas of GG changes. Some mild dec in left GG changes. Repeated round 4 pleural lytics (administered in right lateral tube)  1/28: lateral CT 120 net neg  1/30 no significant change after 7 doses of pleural lytic therapy.  -1/30, seen by Dr. Cliffton Asters, patient refused VATS -1/31: Changed his mind, now agrees to surgery, TCTS notified -2/1: PCCM restarted Heparin gtt -2/3, hemoglobin down to 6.0, heparin held, transfusing, T CTS eval pending -2/4, reevaluated by Dr. Cliffton Asters, plan for VATS -2/5 underwent VATS and decortication of left empyema, Dr. Cliffton Asters 02/08 continue to have air leak with cough, chest tube to  water seal.  Antibiotics now oral.  02/09 continue air leak with cough.  02/10 persistent small left base pneumothorax.

## 2023-02-10 NOTE — Progress Notes (Signed)
Pharmacy Antibiotic Note  Mathew Wyatt is a 63 y.o. male admitted on 02/08/2023 with  polymicrobial empyema, now status post 2 chest tube placements on 1/21 and 1/22 . Significant output since placement (>2L) however decreasing. Initial LA elevated at 4.4 improved to 2.8, WBC 35.7 at admission improved to 18.5. Pharmacy has been consulted for vancomycin, cefepime, and metronidazole dosing. Today is day 4 of antibiotic therapy, renal function remains stable  Plan: - Continue vancomycin 750 mg q12h, cefepime 2g q8h, metronidazole 500 mg q8h Vancomycin: goal AUC 400-550.  eAUC 425.  Vancomycin trough 1/24 AM at steady state - F/u chest fluid cx speciation (polymicrobial w/ GNR, GPCs), DoT  Weight: 80.1 kg (176 lb 9.4 oz)  Temp (24hrs), Avg:97.9 F (36.6 C), Min:97.4 F (36.3 C), Max:98.8 F (37.1 C)  Recent Labs  Lab 02/07/23 1528 02/07/23 1731 02/07/23 1952 02/08/23 0112 02/08/23 0238 02/08/23 0457 02/08/23 0946 02/09/23 0318  WBC 35.7*  --   --   --  26.5*  --  31.0* 18.5*  CREATININE 0.92  --   --   --  0.82  --  0.72 0.58*  LATICACIDVEN  --  4.4* 3.1* 2.3*  --  2.8*  --   --     Estimated Creatinine Clearance: 105.1 mL/min (A) (by C-G formula based on SCr of 0.58 mg/dL (L)).    No Known Allergies  Antimicrobials this admission: Vanc 1/20 >> Cefepime 1/20 >> Flagyl 1/20 >>   Microbiology results: 1/20 BCX x1 [facility]): 1/21 pleural fluid: abundant GNR, GPC pairs/chains 1/21 MRSA PCR: positive  Thank you for allowing pharmacy to be a part of this patient's care.  Rutherford Nail, PharmD PGY2 Critical Care Pharmacy Resident 02/10/2023 11:08 AM

## 2023-02-11 ENCOUNTER — Inpatient Hospital Stay (HOSPITAL_COMMUNITY): Payer: Medicaid Other

## 2023-02-11 DIAGNOSIS — J189 Pneumonia, unspecified organism: Secondary | ICD-10-CM | POA: Insufficient documentation

## 2023-02-11 DIAGNOSIS — Z4682 Encounter for fitting and adjustment of non-vascular catheter: Secondary | ICD-10-CM | POA: Insufficient documentation

## 2023-02-11 DIAGNOSIS — L8990 Pressure ulcer of unspecified site, unspecified stage: Secondary | ICD-10-CM

## 2023-02-11 DIAGNOSIS — J948 Other specified pleural conditions: Secondary | ICD-10-CM | POA: Insufficient documentation

## 2023-02-11 LAB — MAGNESIUM: Magnesium: 1.8 mg/dL (ref 1.7–2.4)

## 2023-02-11 LAB — CBC
HCT: 32.3 % — ABNORMAL LOW (ref 39.0–52.0)
Hemoglobin: 10.5 g/dL — ABNORMAL LOW (ref 13.0–17.0)
MCH: 30 pg (ref 26.0–34.0)
MCHC: 32.5 g/dL (ref 30.0–36.0)
MCV: 92.3 fL (ref 80.0–100.0)
Platelets: 263 10*3/uL (ref 150–400)
RBC: 3.5 MIL/uL — ABNORMAL LOW (ref 4.22–5.81)
RDW: 15.9 % — ABNORMAL HIGH (ref 11.5–15.5)
WBC: 14 10*3/uL — ABNORMAL HIGH (ref 4.0–10.5)
nRBC: 0 % (ref 0.0–0.2)

## 2023-02-11 LAB — BASIC METABOLIC PANEL
Anion gap: 11 (ref 5–15)
BUN: 13 mg/dL (ref 8–23)
CO2: 23 mmol/L (ref 22–32)
Calcium: 8.1 mg/dL — ABNORMAL LOW (ref 8.9–10.3)
Chloride: 105 mmol/L (ref 98–111)
Creatinine, Ser: 0.71 mg/dL (ref 0.61–1.24)
GFR, Estimated: >60 mL/min (ref >60)
Glucose, Bld: 123 mg/dL — ABNORMAL HIGH (ref 70–99)
Potassium: 3 mmol/L — ABNORMAL LOW (ref 3.5–5.1)
Sodium: 139 mmol/L (ref 135–145)

## 2023-02-11 LAB — VANCOMYCIN, TROUGH: Vancomycin Tr: 5 ug/mL — ABNORMAL LOW (ref 15–20)

## 2023-02-11 LAB — HEPARIN LEVEL (UNFRACTIONATED)
Heparin Unfractionated: <0.1 [IU]/mL — ABNORMAL LOW (ref 0.30–0.70)
Heparin Unfractionated: <0.1 [IU]/mL — ABNORMAL LOW (ref 0.30–0.70)

## 2023-02-11 MED ORDER — MAGNESIUM SULFATE 2 GM/50ML IV SOLN
2.0000 g | Freq: Once | INTRAVENOUS | Status: AC
  Administered 2023-02-11: 2 g via INTRAVENOUS
  Filled 2023-02-11: qty 50

## 2023-02-11 MED ORDER — VANCOMYCIN HCL 1000 MG IV SOLR
750.0000 mg | Freq: Once | INTRAVENOUS | Status: DC
  Filled 2023-02-11: qty 15

## 2023-02-11 MED ORDER — VANCOMYCIN HCL 750 MG IV SOLR
750.0000 mg | Freq: Once | INTRAVENOUS | Status: AC
  Administered 2023-02-11: 750 mg via INTRAVENOUS
  Filled 2023-02-11: qty 15

## 2023-02-11 MED ORDER — ZOLPIDEM TARTRATE 5 MG PO TABS
5.0000 mg | ORAL_TABLET | Freq: Every evening | ORAL | Status: DC | PRN
  Administered 2023-02-11: 5 mg via ORAL
  Filled 2023-02-11: qty 1

## 2023-02-11 MED ORDER — POTASSIUM CHLORIDE CRYS ER 20 MEQ PO TBCR
20.0000 meq | EXTENDED_RELEASE_TABLET | ORAL | Status: DC
  Administered 2023-02-11: 20 meq via ORAL
  Filled 2023-02-11: qty 1

## 2023-02-11 MED ORDER — AMIODARONE HCL IN DEXTROSE 360-4.14 MG/200ML-% IV SOLN
30.0000 mg/h | INTRAVENOUS | Status: DC
  Administered 2023-02-11 – 2023-02-13 (×5): 30 mg/h via INTRAVENOUS
  Filled 2023-02-11 (×4): qty 200

## 2023-02-11 MED ORDER — POTASSIUM CHLORIDE CRYS ER 20 MEQ PO TBCR
40.0000 meq | EXTENDED_RELEASE_TABLET | ORAL | Status: AC
  Administered 2023-02-11: 40 meq via ORAL
  Filled 2023-02-11: qty 2

## 2023-02-11 MED ORDER — AMIODARONE LOAD VIA INFUSION
150.0000 mg | Freq: Once | INTRAVENOUS | Status: AC
  Administered 2023-02-11: 150 mg via INTRAVENOUS
  Filled 2023-02-11: qty 83.34

## 2023-02-11 MED ORDER — VANCOMYCIN HCL 750 MG/150ML IV SOLN
750.0000 mg | Freq: Once | INTRAVENOUS | Status: DC
  Filled 2023-02-11: qty 150

## 2023-02-11 MED ORDER — POTASSIUM CHLORIDE 10 MEQ/100ML IV SOLN
10.0000 meq | INTRAVENOUS | Status: DC
  Administered 2023-02-11 (×2): 10 meq via INTRAVENOUS
  Filled 2023-02-11 (×4): qty 100

## 2023-02-11 MED ORDER — AMIODARONE HCL IN DEXTROSE 360-4.14 MG/200ML-% IV SOLN
60.0000 mg/h | INTRAVENOUS | Status: AC
  Administered 2023-02-11: 60 mg/h via INTRAVENOUS
  Filled 2023-02-11: qty 200

## 2023-02-11 MED ORDER — SPIRONOLACTONE 12.5 MG HALF TABLET
12.5000 mg | ORAL_TABLET | Freq: Every day | ORAL | Status: DC
  Administered 2023-02-11 – 2023-02-22 (×12): 12.5 mg via ORAL
  Filled 2023-02-11 (×12): qty 1

## 2023-02-11 MED ORDER — VANCOMYCIN HCL 1500 MG/300ML IV SOLN
1500.0000 mg | Freq: Two times a day (BID) | INTRAVENOUS | Status: DC
  Administered 2023-02-12 – 2023-02-14 (×6): 1500 mg via INTRAVENOUS
  Filled 2023-02-11 (×7): qty 300

## 2023-02-11 MED ORDER — LORAZEPAM 2 MG/ML IJ SOLN
0.5000 mg | INTRAMUSCULAR | Status: DC | PRN
  Administered 2023-02-12 – 2023-02-15 (×3): 0.5 mg via INTRAVENOUS
  Filled 2023-02-11 (×5): qty 1

## 2023-02-11 MED ORDER — HEPARIN (PORCINE) 25000 UT/250ML-% IV SOLN
2550.0000 [IU]/h | INTRAVENOUS | Status: DC
  Administered 2023-02-11: 1100 [IU]/h via INTRAVENOUS
  Administered 2023-02-12: 2000 [IU]/h via INTRAVENOUS
  Administered 2023-02-12: 1550 [IU]/h via INTRAVENOUS
  Administered 2023-02-13: 2400 [IU]/h via INTRAVENOUS
  Administered 2023-02-13 – 2023-02-14 (×2): 2600 [IU]/h via INTRAVENOUS
  Filled 2023-02-11 (×6): qty 250

## 2023-02-11 NOTE — Progress Notes (Addendum)
Advanced Heart Failure Rounding Note  Cardiologist: None  Chief Complaint: SOB    Patient Profile   63 yo male with COPD and ongoing tobacco use. No h/o heart disease. Admitted with AF with RVR in setting of sepsis with massive left hydro/pneumo thorax. S/p emergent left chest tube with > 2L pus out. 2nd CT placed 1/23 for residual loculated empyema.   Subjective:    Still awaiting PCU bed.   C/w CTs. Breathing much improved. Less pleuritic CP.   On Cefepime + flagyl +Vanc for PNA.  Pleural Fluid Cx growing GPR + GPC. Bcx NGTD  WBC 31>>18>>14K   Maintaining NSR.  K 3.0  Mg 1.8   His only complaint today is burning around IV site w/ IV KCl.   Echo: EF 45-50%, global HK, RV nl, small pericardial effusion around LV apex/lateral wall, no tamponade.    Objective:   Weight Range: 80.1 kg Body mass index is 23.95 kg/m.   Vital Signs:   Temp:  [97.5 F (36.4 C)-99.1 F (37.3 C)] 99.1 F (37.3 C) (01/24 0004) Pulse Rate:  [82-100] 100 (01/24 0800) Resp:  [17-28] 19 (01/24 0800) BP: (103-147)/(60-114) 122/60 (01/24 0800) SpO2:  [93 %-99 %] 98 % (01/24 0800) Last BM Date :  (PTA)  Weight change: Filed Weights   02/09/23 0600  Weight: 80.1 kg    Intake/Output:   Intake/Output Summary (Last 24 hours) at 02/11/2023 0842 Last data filed at 02/11/2023 0504 Gross per 24 hour  Intake 1220.61 ml  Output 1760 ml  Net -539.39 ml      Physical Exam   General:  Well appearing. No respiratory difficulty HEENT: normal Neck: supple. no JVD. Carotids 2+ bilat; no bruits. No lymphadenopathy or thyromegaly appreciated. Cor: PMI nondisplaced. Regular rate & rhythm. No rubs, gallops or murmurs. Lungs: + CTs, course anteriorly but overall much improved  Abdomen: soft, nontender, nondistended. No hepatosplenomegaly. No bruits or masses. Good bowel sounds. Extremities: no cyanosis, clubbing, rash, edema Neuro: alert & oriented x 3, cranial nerves grossly intact. moves  all 4 extremities w/o difficulty. Affect pleasant.   Telemetry   NSR 90s w/ PVCs. No further Afib, personally reviewed   EKG    N/A   Labs    CBC Recent Labs    02/10/23 1458 02/11/23 0221  WBC 17.3* 14.0*  HGB 11.1* 10.5*  HCT 34.2* 32.3*  MCV 93.4 92.3  PLT 260 263   Basic Metabolic Panel Recent Labs    16/10/96 0318 02/10/23 1143 02/11/23 0221  NA 138 136 139  K 2.9* 4.6 3.0*  CL 108 106 105  CO2 25 21* 23  GLUCOSE 135* 139* 123*  BUN 18 15 13   CREATININE 0.58* 0.62 0.71  CALCIUM 7.9* 7.8* 8.1*  MG 1.9  --  1.8  PHOS 2.5  --   --    Liver Function Tests Recent Labs    02/09/23 0318  AST 44*  ALT 26  ALKPHOS 117  BILITOT 1.2  PROT 5.6*  ALBUMIN <1.5*   No results for input(s): "LIPASE", "AMYLASE" in the last 72 hours.  Cardiac Enzymes No results for input(s): "CKTOTAL", "CKMB", "CKMBINDEX", "TROPONINI" in the last 72 hours.  BNP: BNP (last 3 results) No results for input(s): "BNP" in the last 8760 hours.  ProBNP (last 3 results) No results for input(s): "PROBNP" in the last 8760 hours.   D-Dimer No results for input(s): "DDIMER" in the last 72 hours. Hemoglobin A1C No results for input(s): "HGBA1C"  in the last 72 hours. Fasting Lipid Panel No results for input(s): "CHOL", "HDL", "LDLCALC", "TRIG", "CHOLHDL", "LDLDIRECT" in the last 72 hours. Thyroid Function Tests No results for input(s): "TSH", "T4TOTAL", "T3FREE", "THYROIDAB" in the last 72 hours.  Invalid input(s): "FREET3"  Other results:   Imaging    CT PERC PLEURAL DRAIN W/INDWELL CATH W/IMG GUIDE Result Date: 02/10/2023 INDICATION: 63 year old male with history of complex empyema with loculated component in the anterior left chest. EXAM: CT PERC PLEURAL DRAIN W/INDWELL CATH W/IMG GUIDE COMPARISON:  None Available. MEDICATIONS: The patient is currently admitted to the hospital and receiving intravenous antibiotics. The antibiotics were administered within an appropriate time  frame prior to the initiation of the procedure. ANESTHESIA/SEDATION: Moderate (conscious) sedation was employed during this procedure. A total of Versed mg and Fentanyl mcg was administered intravenously. Moderate Sedation Time: minutes. The patient's level of consciousness and vital signs were monitored continuously by radiology nursing throughout the procedure under my direct supervision. CONTRAST:  None COMPLICATIONS: None immediate. PROCEDURE: RADIATION DOSE REDUCTION: This exam was performed according to the departmental dose-optimization program which includes automated exposure control, adjustment of the mA and/or kV according to patient size and/or use of iterative reconstruction technique. Informed written consent was obtained from the patient after a discussion of the risks, benefits and alternatives to treatment. The patient was placed supine on the CT gantry and a pre procedural CT was performed re-demonstrating the known fluid collection within the anterior left pleural space. The procedure was planned. A timeout was performed prior to the initiation of the procedure. The left chest was prepped and draped in the usual sterile fashion. The overlying soft tissues were anesthetized with 1% lidocaine with epinephrine. Appropriate trajectory was planned with the use of a 22 gauge spinal needle. An 18 gauge trocar needle was advanced into the pleural space and a short Amplatz super stiff wire was coiled within the collection. Appropriate positioning was confirmed with a limited CT scan. The tract was serially dilated allowing placement of a 14 French all-purpose drainage catheter. Appropriate positioning was confirmed with a limited postprocedural CT scan. A total of approximately 100 ml of purulent fluid was aspirated. Samples were sent to the lab for fluid analysis. The tube was connected to a pleurovac and sutured in place. A dressing was placed. The patient tolerated the procedure well without immediate  post procedural complication. IMPRESSION: Successful CT guided placement of a 62 French all purpose drain catheter into the left anterior loculated empyema with immediate aspiration of approximately 100 mL purulent fluid. Samples were sent to the laboratory as requested by the ordering clinical team. Marliss Coots, MD [facility] Radiology Electronically Signed   By: Marliss Coots M.D.   On: 02/10/2023 10:34     Medications:     Scheduled Medications:  amiodarone  400 mg Oral BID   [START ON 02/17/2023] amiodarone  400 mg Oral Daily   Chlorhexidine Gluconate Cloth  6 each Topical Q0600   Gerhardt's butt cream   Topical TID   heparin  5,000 Units Subcutaneous Q8H   methocarbamol (ROBAXIN) injection  500 mg Intravenous Q8H   Or   methocarbamol  500 mg Oral Q8H   mupirocin ointment   Nasal BID   oxyCODONE  5 mg Oral Q6H   pantoprazole  40 mg Oral Daily   polyethylene glycol  17 g Oral Daily   potassium chloride  20 mEq Oral Q4H   sodium chloride flush  10 mL  Intrapleural Q8H    Infusions:  ceFEPime (MAXIPIME) IV 2 g (02/11/23 0504)   metronidazole Stopped (02/10/23 2233)   potassium chloride 10 mEq (02/11/23 0817)   vancomycin (VANCOCIN) 750 mg in sodium chloride 0.9 % 250 mL IVPB Stopped (02/10/23 2357)    PRN Medications: acetaminophen **OR** acetaminophen, docusate sodium, HYDROmorphone (DILAUDID) injection, ketorolac, melatonin, metoprolol tartrate, ondansetron **OR** ondansetron (ZOFRAN) IV, mouth rinse     Assessment/Plan   1. New onset AF with RVR:  - No prior hx of atrial fibrillation.  - Suspect AF with RVR driven by acute illness  - back in NSR currently.  - transition to PO amio today 400 mg bid  - continue to treat acute lung illness - keep K > 4.0 and Mg > 2.0  - hold off on a/c for now, unless recurrence   2. Acute sepsis/PNA/empyema/Acute Hypoxic Respiratory Failure:  - massive left hydro/pneumo thorax on  admit - s/p emergent Lt CT placement w/ > 2L pus out - s/p additional left throacostomy tube placement for residual loculated empyema on 1/23  - On Cefepime + flagyl + Vanc  - Pleural Fluid Cx growing GPR + GPC. Bcx NGTD - WBC 31>>18>>14. Symptomatically much improved  - further management per CCM  3. HFmrEF - Echo EF 45-50%, global HK, RV nl - suspect tachymediated in setting of Afib - start spiro 12.5 to help w/ hypokalemia and afterload reduction  - can repeat outpatient echo once recovered from acute illness    4. Tobacco use with COPD: - cessation advised   5. Hypokalemia - K 3.0, getting IV K supp - Mg 1.8>>give 2g Mg supp - add spiro 12.5 mg   Transfer orders are in. Waiting for PCU bed. [facility] can pick up once transferred out.   Length of Stay: [address] Cordelia Poche  02/11/2023, 8:42 AM  Advanced Heart Failure Team Pager [phone] (M-F; 7a - 5p)  Please contact [facility] for night-coverage after hours (5p -7a ) and weekends on [email or web address]  Patient seen and examined with the above-signed Advanced Practice Provider and/or Housestaff. I personally reviewed laboratory data, imaging studies and relevant notes. I independently examined the patient and formulated the important aspects of the plan. I have edited the note to reflect any of my changes or salient points. I have personally discussed the plan with the patient and/or family.  Breathing better. Less pleuritic CP.  Empyema culture growing campylobacter and peptostreptococcus. On boraod spectrum abx. CT x 2 in place.   Developed recurrent AF today.   General:  Sitting up in bed HEENT: normal Neck: supple. no JVD. Carotids 2+ bilat; no bruits. No lymphadenopathy or thryomegaly appreciated. Cor: Irregular rate & rhythm. No rubs, gallops or murmurs. Lungs: coarse on left CT x 2 Abdomen: soft, nontender, nondistended. No hepatosplenomegaly. No bruits or masses. Good bowel sounds. Extremities: no  cyanosis, clubbing, rash, edema Neuro: alert & orientedx3, cranial nerves grossly intact. moves all 4 extremities w/o difficulty. Affect pleasant  Respiratory status improving with treatment of empyema.   Now with recurrent AF. We have restarted IV amio. Start heparin gtt (d/w CCM). Start spiro for hypokalemia and mildly reduced EF.  Can be followed by [facility] once out of ICU.   Arvilla Meres, MD  10:31 PM

## 2023-02-11 NOTE — Plan of Care (Signed)
Problem: Safety: Goal: Ability to remain free from injury will improve Outcome: Progressing   Problem: Skin Integrity: Goal: Risk for impaired skin integrity will decrease Outcome: Progressing

## 2023-02-11 NOTE — Progress Notes (Signed)
PHARMACY - ANTICOAGULATION CONSULT NOTE  Pharmacy Consult for IV heparin Indication: atrial fibrillation  No Known Allergies  Patient Measurements: Weight: 80.1 kg (176 lb 9.4 oz) Heparin Dosing Weight: ~ 80 kg  Vital Signs: Temp: 98.6 F (37 C) (01/24 1130) Temp Source: Axillary (01/24 1130) BP: 112/64 (01/24 0900) Pulse Rate: 97 (01/24 0900)  Labs: Recent Labs    02/09/23 0318 02/10/23 0225 02/10/23 1143 02/10/23 1458 02/11/23 0221  HGB 10.2*  --   --  11.1* 10.5*  HCT 32.0*  --   --  34.2* 32.3*  PLT 268  --   --  260 263  LABPROT  --  18.7*  --   --   --   INR  --  1.5*  --   --   --   CREATININE 0.58*  --  0.62  --  0.71    Estimated Creatinine Clearance: 105.1 mL/min (by C-G formula based on SCr of 0.71 mg/dL).   Medical History: History reviewed. No pertinent past medical history.  Medications:  Infusions:   amiodarone     Followed by   amiodarone     ceFEPime (MAXIPIME) IV Stopped (02/11/23 0540)   heparin     metronidazole Stopped (02/11/23 1005)   [START ON 02/12/2023] vancomycin     vancomycin      Assessment: 63 yo male with new onset afib, pharmacy asked to start anticoagulation with IV heparin.  No known anticoagulants PTA or hx bleeding.  Goal of Therapy:  Heparin level 0.3-0.7 units/ml Monitor platelets by anticoagulation protocol: Yes   Plan:  Start IV heparin at 1100 units/hr Check heparin level 6 hrs after gtt starts Daily heparin level and CBC. F/u plans for oral anticoagulation as appropriate.  [name], Colon Flattery, BCCP Clinical Pharmacist  02/11/2023 1:08 PM   [facility] pharmacy phone numbers are listed on [email or web address]

## 2023-02-11 NOTE — TOC Initial Note (Signed)
Transition of Care [facility]) - Initial/Assessment Note    Patient Details  Name: Mathew Wyatt MRN: [id number] Date of Birth: 30-Jan-1960  Transition of [facility]) CM/SW Contact:    Elliot Cousin, RN Phone Number: [phone] 02/11/2023, 2:28 PM  Clinical Narrative:                 TOC CM spoke to pt and states his SO lives in the home. Pt states he is independent pta. Pt reports he works but does not have insurance. Will send to financial counselor to assistance with Medicaid. Pt states not note for work is needed.  Will need to arrange hospital follow up appt for pt. Will use [facility] for medications.   Expected Discharge Plan: Home/Self Care Barriers to Discharge: Continued Medical Work up   Patient Goals and CMS Choice Patient states their goals for this hospitalization and ongoing recovery are:: wants to get better          Expected Discharge Plan and Services   Discharge Planning Services: CM Consult                                          Prior Living Arrangements/Services   Lives with:: Significant Other Patient language and need for interpreter reviewed:: Yes Do you feel safe going back to the place where you live?: Yes      Need for Family Participation in Patient Care: Yes (Comment) Care giver support system in place?: Yes (comment)   Criminal Activity/Legal Involvement Pertinent to Current Situation/Hospitalization: No - Comment as needed  Activities of Daily Living      Permission Sought/Granted Permission sought to share information with : Case Manager, Family Supports, PCP Permission granted to share information with : Yes, Verbal Permission Granted  Share Information with NAME: Jocelyn Lamer     Permission granted to share info w Relationship: SO  Permission granted to share info w Contact Information: [phone]  Emotional Assessment Appearance:: Appears stated age Attitude/Demeanor/Rapport: Engaged Affect (typically observed):  Accepting Orientation: : Oriented to Self, Oriented to Place, Oriented to  Time, Oriented to Situation   Psych Involvement: No (comment)  Admission diagnosis:  Sepsis associated hypotension (HCC) [A41.9, I95.9] Atrial fibrillation with rapid ventricular response (HCC) [I48.91] Empyema (HCC) [J86.9] Patient Active Problem List   Diagnosis Date Noted   Empyema (HCC) 02/08/2023   Sepsis due to pneumonia (HCC) 02/08/2023   Sepsis associated hypotension (HCC) 02/08/2023   Migraine without aura 11/08/2006   LOW BACK PAIN 11/08/2006   PCP:  Patient, No Pcp Per Pharmacy:   [facility] [address] [place] - [address] [place] [address] Phone: [phone] Fax: [phone]     Social Drivers of Health (SDOH) Social History: SDOH Screenings   Tobacco Use: High Risk (02/08/2023)   SDOH Interventions:     Readmission Risk Interventions     No data to display

## 2023-02-11 NOTE — Progress Notes (Signed)
Pharmacy Antibiotic Note  Mathew Wyatt is a 63 y.o. male admitted on 02/08/2023 with  polymicrobial empyema, now status post 2 chest tube placements on 1/21 and 1/22 . Significant output since placement (>2L) however decreasing. Initial LA elevated at 4.4 improved to 2.8, WBC 35.7 at admission improved to 14. Pharmacy has been consulted for vancomycin, cefepime, and metronidazole dosing. Today is day 5 of antibiotic therapy, renal function remains stable  Vancomycin trough low at 5 today  Plan: Increase vancomycin to 1500 mg q 12 hrs. Continue cefepime 2g q8h, metronidazole 500 mg q8h Vancomycin: goal AUC 400-550.  eAUC 506.  - F/u chest fluid cx speciation (polymicrobial w/ GNR, GPCs), DoT  Weight: 80.1 kg (176 lb 9.4 oz)  Temp (24hrs), Avg:98.2 F (36.8 C), Min:97.5 F (36.4 C), Max:99.1 F (37.3 C)  Recent Labs  Lab 02/07/23 1731 02/07/23 1952 02/08/23 0112 02/08/23 0238 02/08/23 0457 02/08/23 0946 02/09/23 0318 02/10/23 1143 02/10/23 1458 02/11/23 0221 02/11/23 0908  WBC  --   --   --  26.5*  --  31.0* 18.5*  --  17.3* 14.0*  --   CREATININE  --   --   --  0.82  --  0.72 0.58* 0.62  --  0.71  --   LATICACIDVEN 4.4* 3.1* 2.3*  --  2.8*  --   --   --   --   --   --   VANCOTROUGH  --   --   --   --   --   --   --   --   --   --  5*    Estimated Creatinine Clearance: 105.1 mL/min (by C-G formula based on SCr of 0.71 mg/dL).    No Known Allergies  Antimicrobials this admission: Vanc 1/20 >> Cefepime 1/20 >> Flagyl 1/20 >>   Microbiology results: 1/20 BCX x1 [facility]): 1/21 pleural fluid: abundant GNR, GPC pairs/chains 1/21 MRSA PCR: positive  Thank you for allowing pharmacy to be a part of this patient's care.  [name], Colon Flattery, BCCP Clinical Pharmacist  02/11/2023 12:58 PM   [facility] pharmacy phone numbers are listed on [email or web address]

## 2023-02-11 NOTE — Progress Notes (Signed)
NAME:  Mathew Wyatt, MRN:  [id number], DOB:  April 05, 1960, LOS: 3 ADMISSION DATE:  02/08/2023, CONSULTATION DATE: 02/08/2023  REFERRING MD: Add, CHIEF COMPLAINT: Hypotension, atrial fibrillation, large empyema on left with hydropneumothorax  History of Present Illness:  63 year old male with past medical history of tobacco use, COPD who presented to [facility] on 1/20 with cough and shortness of breath. Reportedly having worsening symptoms over the preceding 2 weeks. He denied having fever, chills, chest pain, n/v/d. He was found to be tachypneic with significant leukocytosis to 35.7, lactifc 4.4. he was started on IV antibiotics. He had CT chest which demonstrated a large left-sided loculated hydropneumothorax concerning for empyema or abscess. He was transferred to [facility] on 1/21 initially to hospitalist for CT surgery consult and possible VATS. On 1/21 he developed atrial fibrillation with RVR and hypotension and was transferred to [facility] and admitted to ICU.   Pertinent  Medical History  History reviewed. No pertinent past medical history.   Significant Hospital Events: Including procedures, antibiotic start and stop dates in addition to other pertinent events   1/20: [facility] ED with SOB. CT with large left hydropneumo c/w empyema vs abscess.  1/21: TCTS defer VATS. ICU for afib rvr hypotension, started on amiodarone. CT placed with immediate 2L purulent output. Initial tube lytics with additional 300cc output.  1/22: repeat CT chest today to eval for need of additional CT vs more lytics vs observation.  1/23 new chest tube placed by IR, cultures growing GPC and GPR  Interim History / Subjective:  NAEON.  Chest tubes: #1 200cc last 24 hours, #2 (new from 1/23) 360cc past 24 hours. Pleurisy pain improved. Maintaining NSR.  Objective   Blood pressure 112/64, pulse 97, temperature 97.8 F (36.6 C), temperature source Oral, resp. rate (!) 25, weight 80.1 kg, SpO2 92%.        Intake/Output Summary  (Last 24 hours) at 02/11/2023 1006 Last data filed at 02/11/2023 0800 Gross per 24 hour  Intake 1255.96 ml  Output 1760 ml  Net -504.04 ml   Filed Weights   02/09/23 0600  Weight: 80.1 kg    Examination: General: acute on chronically ill appearing male, sitting in chair, in no acute distress  HENT: [place]/AT, anicteric sclera,, mmm Lungs: rhonchi and diminished L, clear right. 2 chest tubes on left with purulent drainage Cardiovascular: RRR, no JVD or peripheral edema  Abdomen: flat, soft, non-tender Extremities: Warm and dry Neuro: alert and oriented, non focal exam GU: no foley    Assessment & Plan:   Sepsis 2/2 left empyema  Acute hypoxic respiratory failure 2/2 empyema + hydroPTX s/p chest tube with new chest tube placed 1/23 by IR - pleural cultures growing GPR and GPC Clear respiratory source. Based on recent 2 week history of cough, sob, may have started as pna and developed empyema. Denies alcohol use or chronic dysphagia. Significant leukocytosis to 35. Lactic 4.4. CT chest with large left sided hydropneumothorax consistent with empyema. 1/21 CT placed with 2L out. Improving wbc. Echo without vegetation.  New 2nd anterior chest tube placed by IR under CT guidance on 1/23. - Cont chest tubes to suction - Consider intrapleural lytics if output slows but imaging still suggestive of fluid (current output acceptable, #1 200cc last 24 hours, #2 (new from 1/23) 360cc past 24 hours). - con't broad antibiotics with vanc/cefepime/flagyl  - f/u pleural fluid studies until speciation - supplemental O2 to maintain SpO2 >90% - Continue pain control, Oxy, Robaxin, Dilaudid, Toradol  Atrial fibrillation  with RVR, new - resolved with Amio and now NSR HFmrEF - 2/2 above Likely related to acute illness. No previous history. Not anticoagulated PTA. - continue amio, converted to oral 1/24 - no need for [facility] currently - continue spiro per HF - HF following, appreciate recs - tele monitoring -  F/u echo as outpatient after recovers from acute illness  History of tobacco abuse - Smoking cessation  Hypokalemia/Hypomagnesemia - both being repleted - Continue repletion - Continue spiro per HF - Follow BMP   Stable to transfer out of ICU when bed available. PCCM to follow for chest tube.  Best Practice (right click and "Reselect all SmartList Selections" daily)   Diet/type: Regular consistency (see orders) DVT prophylaxis prophylactic heparin  Pressure ulcer(s): N/A GI prophylaxis: PPI Lines: N/A Foley:  N/A Code Status:  full code Last date of multidisciplinary goals of care discussion [patient updated on plan of care at bedside]    Rutherford Guys, PA - [facility] For pager details, please see AMION or use Epic chat  After 1900, please call [facility] for cross coverage needs 02/11/2023, 10:06 AM

## 2023-02-11 NOTE — Progress Notes (Signed)
PHARMACY - ANTICOAGULATION CONSULT NOTE  Pharmacy Consult for IV heparin Indication: atrial fibrillation  No Known Allergies  Patient Measurements: Weight: 80.1 kg (176 lb 9.4 oz) Heparin Dosing Weight: ~ 80 kg  Vital Signs: Temp: 97.4 F (36.3 C) (01/24 1918) Temp Source: Oral (01/24 1918) BP: 114/65 (01/24 1852) Pulse Rate: 86 (01/24 1918)  Labs: Recent Labs    02/09/23 0318 02/10/23 0225 02/10/23 1143 02/10/23 1458 02/11/23 0221 02/11/23 1308 02/11/23 1944  HGB 10.2*  --   --  11.1* 10.5*  --   --   HCT 32.0*  --   --  34.2* 32.3*  --   --   PLT 268  --   --  260 263  --   --   LABPROT  --  18.7*  --   --   --   --   --   INR  --  1.5*  --   --   --   --   --   HEPARINUNFRC  --   --   --   --   --  <0.10* <0.10*  CREATININE 0.58*  --  0.62  --  0.71  --   --     Estimated Creatinine Clearance: 105.1 mL/min (by C-G formula based on SCr of 0.71 mg/dL).   Medical History: History reviewed. No pertinent past medical history.  Medications:  Infusions:   amiodarone 30 mg/hr (02/11/23 1930)   ceFEPime (MAXIPIME) IV 2 g (02/11/23 2006)   heparin 1,100 Units/hr (02/11/23 1900)   metronidazole Stopped (02/11/23 1005)   [START ON 02/12/2023] vancomycin      Assessment: 63 yo male with new onset afib, pharmacy asked to start anticoagulation with IV heparin.  No known anticoagulants PTA or hx bleeding.  PM: heparin level < 0.1 on heparin 1100 units/hr. No issues with the heparin infusion (no pauses/interruptions/infiltrations) or bleeding reported per RN.  Goal of Therapy:  Heparin level 0.3-0.7 units/ml Monitor platelets by anticoagulation protocol: Yes   Plan:  Increase IV heparin to 1350 units/hr Check heparin level 6 hrs after increase Daily heparin level and CBC. F/u plans for oral anticoagulation as appropriate.  Loralee Pacas, PharmD, BCPS Clinical Pharmacist  02/11/2023 8:13 PM   [facility] pharmacy phone numbers are listed on [email or web address]

## 2023-02-11 NOTE — Progress Notes (Signed)
[facility] ADULT ICU REPLACEMENT PROTOCOL   The patient does apply for the [facility] Adult ICU Electrolyte Replacment Protocol based on the criteria listed below:   1.Exclusion criteria: TCTS, ECMO, Dialysis, and Myasthenia Gravis patients 2. Is GFR >/= 30 ml/min? Yes.    Patient's GFR today is >60 3. Is SCr </= 2? Yes.   Patient's SCr is 0.71 mg/dL 4. Did SCr increase >/= 0.5 in 24 hours? No. 5.Pt's weight >40kg  Yes.   6. Abnormal electrolyte(s): K 3.0  7. Electrolytes replaced per protocol 8.  Call MD STAT for K+ </= 2.5, Phos </= 1, or Mag </= 1 Physician:    Markus Daft A 02/11/2023 5:03 AM

## 2023-02-11 NOTE — Progress Notes (Signed)
PROGRESS NOTE  Mathew Wyatt ONG:[id number] DOB: 08/14/1960 DOA: 02/08/2023 PCP: Patient, No Pcp Per   LOS: 3 days   Brief narrative:  Mathew Wyatt is a 63 y.o. male with past medical history of COPD and a smoker who had initially presented to [facility] hospital on 02/07/2023 with shortness of breath dyspnea on exertion with productive cough.  He was noted to be tachypneic with significant leukocytosis and elevated lactate.  Patient was started on IV antibiotic.  CT scan of the chest however was concerning for empyema versus abscess so he was transferred to [facility] on 02/08/2023 for possible VATS.  Patient however went into A-fib with RVR and hypotension and was admitted to ICU service.  PCCM placed a left-sided chest tube on 02/08/2023 and IR was subsequently consulted to put the second chest tube in the anterior pleural space for additional fluid drainage.  At this time, patient has been transferred out of the ICU.    Assessment/Plan: Principal Problem:   Sepsis due to pneumonia [facility]) Active Problems:   Empyema (HCC)   Sepsis associated hypotension (HCC)  Sepsis secondary to pneumonia with left-sided empyema leading to acute hypoxic respiratory failure.  Status post chest tube placement x2.  Second chest tube placement by IR 02/10/2023.On vancomycin, cefepime and Flagyl.  Pleural fluid growing gram-positive rods and gram-positive cocci.  Blood cultures negative so far.  WBC has trended down to 14.0 from initial 35.7.   CT surgery, pulmonary following.  Following.  Continue pain management.  Postthrombolytic placement on 02/08/2023.    Follow pulmonary recommendations.  New onset atrial fibrillation with RVR.  Was on amiodarone drip, cardiology following.  Has been changed to amiodarone p.o.  Continue electrolyte monitoring and supplementation as needed.  Rate controlled at this time.  Acute systolic congestive heart failure with moderately reduced ejection fraction.  2D echocardiogram 45 to  50% with global hypokinesis.  Likely tachycardia mediated heart failure in the setting of A-fib.  Cardiology following.  Heart rate has improved at this time.  History of COPD smoker.  Continue nebulizers.  DVT prophylaxis: heparin injection 5,000 Units Start: 02/11/23 0600 SCDs Start: 02/08/23 0845 SCDs Start: 02/08/23 0305   Disposition: Home, uncertain at this time.  Status is: Inpatient Remains inpatient appropriate because: IV antibiotics chest tubes x 2, empyema, pending clinical improvement    Code Status:     Code Status: Full Code  Family Communication: None at bedside  Consultants: CT surgery PCCM Interventional radiology  Procedures: Left-sided chest tube placement x 2.  Anti-infectives:  Vanco, cefepime and metronidazole  Anti-infectives (From admission, onward)    Start     Dose/Rate Route Frequency Ordered Stop   02/10/23 2130  metroNIDAZOLE (FLAGYL) IVPB 500 mg        500 mg 100 mL/hr over 60 Minutes Intravenous Every 12 hours 02/10/23 1434     02/08/23 1730  metroNIDAZOLE (FLAGYL) IVPB 500 mg  Status:  Discontinued        500 mg 100 mL/hr over 60 Minutes Intravenous Every 8 hours 02/08/23 1637 02/10/23 1434   02/08/23 0400  vancomycin (VANCOREADY) IVPB 750 mg/150 mL  Status:  Discontinued        750 mg 150 mL/hr over 60 Minutes Intravenous Every 12 hours 02/08/23 0324 02/08/23 0328   02/08/23 0400  ceFEPIme (MAXIPIME) 2 g in sodium chloride 0.9 % 100 mL IVPB        2 g 200 mL/hr over 30 Minutes Intravenous Every 8 hours 02/08/23 0324  02/08/23 0400  vancomycin (VANCOCIN) 750 mg in sodium chloride 0.9 % 250 mL IVPB        750 mg 250 mL/hr over 60 Minutes Intravenous Every 12 hours 02/08/23 0328         Subjective: Today, patient was seen and examined at bedside.  Patient states that he could not sleep well in the nighttime but denies overt pain shortness of breath or dyspnea.  Feels sleepy at this time.  Objective: Vitals:   02/11/23 0800  02/11/23 0900  BP: 122/60 112/64  Pulse: 100 97  Resp: 19 (!) 25  Temp: 97.8 F (36.6 C)   SpO2: 98% 92%    Intake/Output Summary (Last 24 hours) at 02/11/2023 1124 Last data filed at 02/11/2023 0800 Gross per 24 hour  Intake 999.03 ml  Output 1040 ml  Net -40.97 ml   Filed Weights   02/09/23 0600  Weight: 80.1 kg   Body mass index is 23.95 kg/m.   Physical Exam:  GENERAL: Patient is mildly somnolent but Communicative, not in obvious distress.  On nasal cannula oxygen appears chronically ill HENT: No scleral pallor or icterus. Pupils equally reactive to light. Oral mucosa is moist NECK: is supple, no gross swelling noted. CHEST: Left chest wall with chest tubes x 2.  Decreased breath sounds bilaterally.   CVS: S1 and S2 heard, no murmur. Regular rate and rhythm.  ABDOMEN: Soft, non-tender, bowel sounds are present. EXTREMITIES: No edema. CNS: Cranial nerves are intact. No focal motor deficits. SKIN: warm and dry without rashes.  Data Review: I have personally reviewed the following laboratory data and studies,  CBC: Recent Labs  Lab 02/07/23 1528 02/08/23 0238 02/08/23 0946 02/09/23 0318 02/10/23 1458 02/11/23 0221  WBC 35.7* 26.5* 31.0* 18.5* 17.3* 14.0*  NEUTROABS 32.3* 23.8*  --   --   --   --   HGB 11.3* 10.3* 9.8* 10.2* 11.1* 10.5*  HCT 35.3* 31.6* 30.7* 32.0* 34.2* 32.3*  MCV 93.9 93.8 94.8 93.6 93.4 92.3  PLT 396 300 336 268 260 263   Basic Metabolic Panel: Recent Labs  Lab 02/07/23 1528 02/08/23 0238 02/08/23 0946 02/09/23 0318 02/10/23 1143 02/11/23 0221  NA 134* 134*  --  138 136 139  K 4.2 3.8  --  2.9* 4.6 3.0*  CL 93* 100  --  108 106 105  CO2 24 25  --  25 21* 23  GLUCOSE 237* 153*  --  135* 139* 123*  BUN 25* 19  --  18 15 13   CREATININE 0.92 0.82 0.72 0.58* 0.62 0.71  CALCIUM 8.9 8.2*  --  7.9* 7.8* 8.1*  MG  --   --   --  1.9  --  1.8  PHOS  --   --   --  2.5  --   --    Liver Function Tests: Recent Labs  Lab 02/07/23 1528  02/09/23 0318  AST 33 44*  ALT 22 26  ALKPHOS 157* 117  BILITOT 2.1* 1.2  PROT 8.0 5.6*  ALBUMIN 1.8* <1.5*   Recent Labs  Lab 02/07/23 1528  LIPASE 17   No results for input(s): "AMMONIA" in the last 168 hours. Cardiac Enzymes: No results for input(s): "CKTOTAL", "CKMB", "CKMBINDEX", "TROPONINI" in the last 168 hours. BNP (last 3 results) No results for input(s): "BNP" in the last 8760 hours.  ProBNP (last 3 results) No results for input(s): "PROBNP" in the last 8760 hours.  CBG: No results for input(s): "GLUCAP" in the last  168 hours. Recent Results (from the past 240 hours)  Culture, blood (single)     Status: None (Preliminary result)   Collection Time: 02/07/23  5:31 PM   Specimen: BLOOD  Result Value Ref Range Status   Specimen Description BLOOD BLOOD LEFT ARM  Final   Special Requests   Final    BOTTLES DRAWN AEROBIC AND ANAEROBIC Blood Culture results may not be optimal due to an inadequate volume of blood received in culture bottles   Culture   Final    NO GROWTH 4 DAYS Performed at [facility], [address] [place], [place] [address]    Report Status PENDING  Incomplete  MRSA Next Gen by PCR, Nasal     Status: Abnormal   Collection Time: 02/08/23  9:34 AM   Specimen: Nasal Mucosa; Nasal Swab  Result Value Ref Range Status   MRSA by PCR Next Gen DETECTED (A) NOT DETECTED Final    Comment: RESULT CALLED TO, READ BACK BY AND VERIFIED WITH: RN christine h. [id number] fcp (NOTE) The GeneXpert MRSA Assay (FDA approved for NASAL specimens only), is one component of a comprehensive MRSA colonization surveillance program. It is not intended to diagnose MRSA infection nor to guide or monitor treatment for MRSA infections. Test performance is not FDA approved in patients less than 28 years old. Performed at [facility], [address] [place], [place] [address]   Culture, blood (Routine X 2) w Reflex to ID Panel     Status: None (Preliminary  result)   Collection Time: 02/08/23  9:46 AM   Specimen: BLOOD LEFT HAND  Result Value Ref Range Status   Specimen Description BLOOD LEFT HAND  Final   Special Requests   Final    BOTTLES DRAWN AEROBIC AND ANAEROBIC Blood Culture results may not be optimal due to an inadequate volume of blood received in culture bottles   Culture   Final    NO GROWTH 3 DAYS Performed at [facility], [address], [place], [place] [address]    Report Status PENDING  Incomplete  Culture, blood (Routine X 2) w Reflex to ID Panel     Status: None (Preliminary result)   Collection Time: 02/08/23  9:54 AM   Specimen: BLOOD LEFT HAND  Result Value Ref Range Status   Specimen Description BLOOD LEFT HAND  Final   Special Requests   Final    BOTTLES DRAWN AEROBIC AND ANAEROBIC Blood Culture results may not be optimal due to an inadequate volume of blood received in culture bottles   Culture   Final    NO GROWTH 3 DAYS Performed at [facility], [address] [place], [place] [address]    Report Status PENDING  Incomplete  Body fluid culture w Gram Stain     Status: None (Preliminary result)   Collection Time: 02/08/23 11:04 AM   Specimen: Pleural Fluid  Result Value Ref Range Status   Specimen Description PLEURAL  Final   Special Requests NONE  Final   Gram Stain   Final    ABUNDANT WBC PRESENT, PREDOMINANTLY PMN ABUNDANT GRAM NEGATIVE RODS ABUNDANT GRAM POSITIVE COCCI IN PAIRS IN CHAINS Gram Stain Report Called to,Read Back By and Verified With: C. HICKLING [id number] @ 1519 FH    Culture   Final    HOLDING FOR POSSIBLE ANAEROBE Performed at [facility], [address] [place], [place] [address]    Report Status PENDING  Incomplete  Aerobic/Anaerobic Culture w Gram Stain (surgical/deep wound)     Status: None (Preliminary result)   Collection Time: 02/10/23  9:23 AM   Specimen: Abscess  Result Value Ref Range Status   Specimen Description ABSCESS  Final   Special Requests  chest  Final   Gram Stain   Final    ABUNDANT WBC PRESENT, PREDOMINANTLY PMN ABUNDANT GRAM POSITIVE COCCI RARE GRAM POSITIVE RODS    Culture   Final    NO GROWTH < 24 HOURS Performed at [facility], [address] [place], [place] [address]    Report Status PENDING  Incomplete     Studies: DG Chest 1 View Result Date: 02/11/2023 CLINICAL DATA:  Left-sided pleural effusion EXAM: CHEST  1 VIEW COMPARISON:  X-ray and CT 02/09/2023 and older. FINDINGS: Placement of a second pigtail catheter adjacent to the first at the left lung base. Small left basilar pneumothorax is slightly increased. Persistent opacity at the left mid lower lung. Mild linear opacity at the right lung base is slightly increased from previous. Stable cardiopericardial silhouette. No pneumothorax. Overlapping cardiac leads. IMPRESSION: Placement of a second pigtail catheter at the left lung base. Slight increase in the small left pneumothorax. Persistent adjacent opacity. Prostate increase in presumed atelectasis at the right lung base. Electronically Signed   By: Karen Kays M.D.   On: 02/11/2023 11:09   CT PERC PLEURAL DRAIN W/INDWELL CATH W/IMG GUIDE Result Date: 02/10/2023 INDICATION: 63 year old male with history of complex empyema with loculated component in the anterior left chest. EXAM: CT PERC PLEURAL DRAIN W/INDWELL CATH W/IMG GUIDE COMPARISON:  None Available. MEDICATIONS: The patient is currently admitted to the hospital and receiving intravenous antibiotics. The antibiotics were administered within an appropriate time frame prior to the initiation of the procedure. ANESTHESIA/SEDATION: Moderate (conscious) sedation was employed during this procedure. A total of Versed mg and Fentanyl mcg was administered intravenously. Moderate Sedation Time: minutes. The patient's level of consciousness and vital signs were monitored continuously by radiology nursing throughout the procedure under my direct supervision.  CONTRAST:  None COMPLICATIONS: None immediate. PROCEDURE: RADIATION DOSE REDUCTION: This exam was performed according to the departmental dose-optimization program which includes automated exposure control, adjustment of the mA and/or kV according to patient size and/or use of iterative reconstruction technique. Informed written consent was obtained from the patient after a discussion of the risks, benefits and alternatives to treatment. The patient was placed supine on the CT gantry and a pre procedural CT was performed re-demonstrating the known fluid collection within the anterior left pleural space. The procedure was planned. A timeout was performed prior to the initiation of the procedure. The left chest was prepped and draped in the usual sterile fashion. The overlying soft tissues were anesthetized with 1% lidocaine with epinephrine. Appropriate trajectory was planned with the use of a 22 gauge spinal needle. An 18 gauge trocar needle was advanced into the pleural space and a short Amplatz super stiff wire was coiled within the collection. Appropriate positioning was confirmed with a limited CT scan. The tract was serially dilated allowing placement of a 14 French all-purpose drainage catheter. Appropriate positioning was confirmed with a limited postprocedural CT scan. A total of approximately 100 ml of purulent fluid was aspirated. Samples were sent to the lab for fluid analysis. The tube was connected to a pleurovac and sutured in place. A dressing was placed. The patient tolerated the procedure well without immediate post procedural complication. IMPRESSION: Successful CT guided placement  of a 5 [place] all purpose drain catheter into the left anterior loculated empyema with immediate aspiration of approximately 100 mL purulent fluid. Samples were sent to the laboratory as requested by the ordering clinical team. Marliss Coots, MD [facility] Radiology  Electronically Signed   By: Marliss Coots M.D.   On: 02/10/2023 10:34      Joycelyn Das, MD  [facility] 02/11/2023  If 7PM-7AM, please contact night-coverage

## 2023-02-11 NOTE — Progress Notes (Signed)
Referring Physician(s): Dr. Levon Hedger  Supervising Physician: Simonne Come  Patient Status:  [facility] - In-pt  Chief Complaint:  Left sided empyema s/p left sided chest tube placed by critical care and a send chest tube placed by IR on 1.23.24  Subjective:  Patient laying in bed. States that he wants to have the chest tubes removed.   Allergies: Patient has no known allergies.  Medications: Prior to Admission medications   Not on File     Vital Signs: BP 112/64   Pulse 97   Temp 97.8 F (36.6 C) (Oral)   Resp (!) 25   Wt 176 lb 9.4 oz (80.1 kg)   SpO2 92%   BMI 23.95 kg/m   Physical Exam Vitals and nursing note reviewed.  Constitutional:      Appearance: He is well-developed.  HENT:     Head: Normocephalic.  Pulmonary:     Effort: Pulmonary effort is normal.     Comments: Chest tube X 2. Left anterior chest tube with 370 ml of serosanguinous fluid noted to be in atrium. No air leak or kinks noted. Dressing is clean dry and intact.   Musculoskeletal:        General: Normal range of motion.     Cervical back: Normal range of motion.  Skin:    General: Skin is dry.  Neurological:     Mental Status: He is alert and oriented to person, place, and time.     Imaging: DG Chest 1 View Result Date: 02/11/2023 CLINICAL DATA:  Left-sided pleural effusion EXAM: CHEST  1 VIEW COMPARISON:  X-ray and CT 02/09/2023 and older. FINDINGS: Placement of a second pigtail catheter adjacent to the first at the left lung base. Small left basilar pneumothorax is slightly increased. Persistent opacity at the left mid lower lung. Mild linear opacity at the right lung base is slightly increased from previous. Stable cardiopericardial silhouette. No pneumothorax. Overlapping cardiac leads. IMPRESSION: Placement of a second pigtail catheter at the left lung base. Slight increase in the small left pneumothorax. Persistent adjacent opacity. Prostate increase in presumed atelectasis at the  right lung base. Electronically Signed   By: Karen Kays M.D.   On: 02/11/2023 11:09   CT PERC PLEURAL DRAIN W/INDWELL CATH W/IMG GUIDE Result Date: 02/10/2023 INDICATION: 63 year old male with history of complex empyema with loculated component in the anterior left chest. EXAM: CT PERC PLEURAL DRAIN W/INDWELL CATH W/IMG GUIDE COMPARISON:  None Available. MEDICATIONS: The patient is currently admitted to the hospital and receiving intravenous antibiotics. The antibiotics were administered within an appropriate time frame prior to the initiation of the procedure. ANESTHESIA/SEDATION: Moderate (conscious) sedation was employed during this procedure. A total of Versed mg and Fentanyl mcg was administered intravenously. Moderate Sedation Time: minutes. The patient's level of consciousness and vital signs were monitored continuously by radiology nursing throughout the procedure under my direct supervision. CONTRAST:  None COMPLICATIONS: None immediate. PROCEDURE: RADIATION DOSE REDUCTION: This exam was performed according to the departmental dose-optimization program which includes automated exposure control, adjustment of the mA and/or kV according to patient size and/or use of iterative reconstruction technique. Informed written consent was obtained from the patient after a discussion of the risks, benefits and alternatives to treatment. The patient was placed supine on the CT gantry and a pre procedural CT was performed re-demonstrating the known fluid collection within the anterior left pleural space. The procedure was planned. A timeout was performed prior to the initiation of the procedure. The  left chest was prepped and draped in the usual sterile fashion. The overlying soft tissues were anesthetized with 1% lidocaine with epinephrine. Appropriate trajectory was planned with the use of a 22 gauge spinal needle. An 18 gauge trocar needle was advanced into the pleural space and a short Amplatz super stiff wire  was coiled within the collection. Appropriate positioning was confirmed with a limited CT scan. The tract was serially dilated allowing placement of a 14 French all-purpose drainage catheter. Appropriate positioning was confirmed with a limited postprocedural CT scan. A total of approximately 100 ml of purulent fluid was aspirated. Samples were sent to the lab for fluid analysis. The tube was connected to a pleurovac and sutured in place. A dressing was placed. The patient tolerated the procedure well without immediate post procedural complication. IMPRESSION: Successful CT guided placement of a 63 French all purpose drain catheter into the left anterior loculated empyema with immediate aspiration of approximately 100 mL purulent fluid. Samples were sent to the laboratory as requested by the ordering clinical team. Marliss Coots, MD [facility] Electronically Signed   By: Marliss Coots M.D.   On: 02/10/2023 10:34   DG CHEST PORT 1 VIEW Result Date: 02/09/2023 CLINICAL DATA:  63 year old male with left hydropneumothorax, loculation, empyema. Chest tube placement. EXAM: PORTABLE CHEST 1 VIEW COMPARISON:  Chest CT 1035 hours today reported separately. FINDINGS: Portable AP semi upright view at 0736 hours. Pigtail left chest tube appears stable since yesterday. Regression of the post drainage left lower lung pleural air since yesterday. Otherwise on going veiling and confluent left mid and lower lung opacity. See additional details on CT. Right lung ventilation not significantly changed. Mediastinal contour stable. Visualized tracheal air column is within normal limits. No acute osseous abnormality identified. IMPRESSION: 1. Regression of post drainage left lower pleural air since yesterday. Stable left chest tube. 2. Otherwise stable chest, see additional details on CT today reported just now. Electronically Signed   By: Odessa Fleming M.D.   On: 02/09/2023 11:01    CT CHEST WO CONTRAST Result Date: 02/09/2023 CLINICAL DATA:  63 year old male with left hydropneumothorax, loculation, empyema. Chest tube placement. EXAM: CT CHEST WITHOUT CONTRAST TECHNIQUE: Multidetector CT imaging of the chest was performed following the standard protocol without IV contrast. RADIATION DOSE REDUCTION: This exam was performed according to the departmental dose-optimization program which includes automated exposure control, adjustment of the mA and/or kV according to patient size and/or use of iterative reconstruction technique. COMPARISON:  Portable chest radiographs yesterday, CT 02/07/2023. FINDINGS: Cardiovascular: Vascular patency is not evaluated in the absence of IV contrast. Heart size remains within normal limits. There is a small pericardial effusion now suspected on series 3, image 132. Mild Calcified aortic atherosclerosis. Mediastinum/Nodes: Abnormal medial left anterior lung/pleural collection inseparable from the mediastinum including along the main pulmonary artery (series 3, image 93) further detailed below. No separate mediastinal mass. Mediastinal lymph nodes appear stable and reactive. But furthermore, there is moderate epicardial and superior diaphragmatic soft tissue stranding near the cardiac apex on series 3, image 126. Lungs/Pleura: Oval loculated lung or pleural collection along the anterior upper lobe contiguous with the mediastinum has trace internal gas (series 3, image 93 and otherwise measures fluid density encompassing 78 x 48 by 112 mm now (AP by transverse by CC) for an estimated volume of 200 mL (coronal image 99) this appears slightly larger since 02/07/2023. Interval subtotal drainage of the dominant loculated left lung or pleural collection seen  previously with left lower lung pigtail drainage catheter now terminating on series 3, image 136. However, ongoing extensive left perihilar and lower lung consolidation there. Small loculated left pneumothorax or  residual empyema along the lung periphery such as series 3, image 81. Overall no significant improvement in left lung ventilation since the prior CT. Central airways remain patent. New small layering right pleural effusion (series 3, image 131). Increased patchy right lower lobe adjacent lung opacity with redemonstrated 14 mm lung nodule on series 4, image 116. Right upper lobe and right middle lobe are stable. Upper Abdomen: Negative visible noncontrast liver, spleen, pancreas, bowel in the upper abdomen. Stable visible adrenal glands and kidneys. Musculoskeletal: No acute or suspicious osseous lesion identified. Left chest wall edema, trace soft tissue gas following chest tube placement. IMPRESSION: 1. Pigtail left lower lung pleural catheter in place with substantial drainage of the dominant pleural collection/empyema, but extensive ongoing left mid and lower lung Consolidation such that no significant improvement in left lung ventilation. 2. Separate loculated and un-drained left anterior chest/pleural collection/empyema which is inseparable from the mediastinum (see #3) and has an estimated volume of 200 mL (coronal image 99). 3. Small pericardial effusion is apparent now, with epicardial inflammatory stranding near the cardiac apex. Secondary Infectious Pericarditis not excluded. Reactive appearing mediastinal lymph nodes. 4. Small new layering right pleural effusion. Increasing right lower lobe opacity which could be atelectasis or infection. Electronically Signed   By: Odessa Fleming M.D.   On: 02/09/2023 10:59   ECHOCARDIOGRAM COMPLETE Result Date: 02/08/2023    ECHOCARDIOGRAM REPORT   Patient Name:   ALERIC FROELICH Date of Exam: 02/08/2023 Medical Rec #:  [id number]    Height:       72.0 in Accession #:    [id number]   Weight:       149.9 lb Date of Birth:  1960-10-23     BSA:          1.885 m Patient Age:    62 years     BP:           106/69 mmHg Patient Gender: M            HR:           75 bpm. Exam Location:   Inpatient Procedure: 2D Echo, Color Doppler and Cardiac Doppler Indications:    atrial fibrillation  History:        Patient has no prior history of Echocardiogram examinations.                 COPD and Sepsis; Risk Factors:Current Smoker.  Sonographer:    Delcie Roch RDCS Referring Phys: [id number] DANIEL R BENSIMHON IMPRESSIONS  1. Left ventricular ejection fraction, by estimation, is 45 to 50%. The left ventricle has mildly decreased function. The left ventricle demonstrates global hypokinesis. Left ventricular diastolic parameters were normal. The average left ventricular global longitudinal strain is -12.9 %. The global longitudinal strain is abnormal.  2. Right ventricular systolic function is normal. The right ventricular size is mildly enlarged. Tricuspid regurgitation signal is inadequate for assessing PA pressure.  3. There is a small pericardial effusion, mostly around the LV apex and lateral wall, in the immediate vicinity of an area of complex loculated small left pleural effusion. There appears to be exaggerated respiratory displacement of the ventricular septum, suggesting ventricular interdependence. Consider a component of constrictive physiology. a small pericardial effusion is present. There is no evidence of cardiac tamponade.  4. The mitral valve is  normal in structure. Trivial mitral valve regurgitation. No evidence of mitral stenosis.  5. The aortic valve is tricuspid. Aortic valve regurgitation is not visualized. No aortic stenosis is present.  6. The inferior vena cava is dilated in size with >50% respiratory variability, suggesting right atrial pressure of 8 mmHg. FINDINGS  Left Ventricle: Left ventricular ejection fraction, by estimation, is 45 to 50%. The left ventricle has mildly decreased function. The left ventricle demonstrates global hypokinesis. The average left ventricular global longitudinal strain is -12.9 %. The global longitudinal strain is abnormal. The left ventricular  internal cavity size was normal in size. There is no left ventricular hypertrophy. Left ventricular diastolic parameters were normal. Normal left ventricular filling pressure. Right Ventricle: The right ventricular size is mildly enlarged. No increase in right ventricular wall thickness. Right ventricular systolic function is normal. Tricuspid regurgitation signal is inadequate for assessing PA pressure. Left Atrium: Left atrial size was normal in size. Right Atrium: Right atrial size was normal in size. Pericardium: There is a small pericardial effusion, mostly around the LV apex and lateral wall, in the immediate vicinity of an area of complex loculated small left pleural effusion. There appears to be exaggerated respiratory displacement of the ventricular septum, suggesting ventricular interdependence. Consider a component of constrictive physiology. A small pericardial effusion is present. There is no evidence of cardiac tamponade. Mitral Valve: The mitral valve is normal in structure. Trivial mitral valve regurgitation. No evidence of mitral valve stenosis. Tricuspid Valve: The tricuspid valve is normal in structure. Tricuspid valve regurgitation is not demonstrated. Aortic Valve: The aortic valve is tricuspid. Aortic valve regurgitation is not visualized. No aortic stenosis is present. Pulmonic Valve: The pulmonic valve was grossly normal. Pulmonic valve regurgitation is not visualized. No evidence of pulmonic stenosis. Aorta: The aortic root and ascending aorta are structurally normal, with no evidence of dilitation. Venous: The inferior vena cava is dilated in size with greater than 50% respiratory variability, suggesting right atrial pressure of 8 mmHg. IAS/Shunts: No atrial level shunt detected by color flow Doppler.  LEFT VENTRICLE PLAX 2D LVIDd:         4.30 cm   Diastology LVIDs:         3.40 cm   LV e' medial:    8.59 cm/s LV PW:         1.00 cm   LV E/e' medial:  9.0 LV IVS:        0.90 cm   LV e'  lateral:   8.81 cm/s LVOT diam:     2.20 cm   LV E/e' lateral: 8.8 LV SV:         71 LV SV Index:   38        2D Longitudinal Strain LVOT Area:     3.80 cm  2D Strain GLS Avg:     -12.9 %  RIGHT VENTRICLE             IVC RV Basal diam:  3.00 cm     IVC diam: 2.30 cm RV S prime:     13.10 cm/s TAPSE (M-mode): 1.8 cm LEFT ATRIUM             Index        RIGHT ATRIUM           Index LA diam:        2.60 cm 1.38 cm/m   RA Area:     16.70 cm LA Vol (A2C):   61.9 ml 32.84  ml/m  RA Volume:   47.30 ml  25.09 ml/m LA Vol (A4C):   50.7 ml 26.90 ml/m LA Biplane Vol: 57.6 ml 30.56 ml/m  AORTIC VALVE LVOT Vmax:   106.00 cm/s LVOT Vmean:  66.100 cm/s LVOT VTI:    0.187 m  AORTA Ao Root diam: 3.50 cm Ao Asc diam:  3.40 cm MITRAL VALVE MV Area (PHT): 3.85 cm    SHUNTS MV Decel Time: 197 msec    Systemic VTI:  0.19 m MV E velocity: 77.70 cm/s  Systemic Diam: 2.20 cm MV A velocity: 79.30 cm/s MV E/A ratio:  0.98 Mihai Croitoru MD Electronically signed by Thurmon Fair MD Signature Date/Time: 02/08/2023/4:53:23 PM    Final    DG CHEST PORT 1 VIEW Result Date: 02/08/2023 CLINICAL DATA:  63 year old male with left hydropneumothorax, loculation, suspicious for empyema. Chest tube placement. EXAM: PORTABLE CHEST 1 VIEW COMPARISON:  CT chest 1819 hours yesterday. Portable chest 0327 hours today. FINDINGS: Portable AP semi upright view at 1055 hours. Pigtail left lower chest tube now in place. Decreased left lung base opacification but continued left lower lung volume loss with rind like thickening and small volume loculated left lung base gas/pneumothorax now. Ongoing confluent and indistinct left perihilar opacity with some air bronchograms. Ongoing left hemithorax volume loss with some leftward mediastinal shift. Right lung appears stable, negative. Visualized tracheal air column is within normal limits. Stable visualized osseous structures. Negative bowel gas. IMPRESSION: 1. Left lower pigtail chest tube placed with evidence  of some loculated fluid drainage, but poor lung inflation and associated left lower pleural air now. Ongoing left perihilar consolidation and opacity. 2. Stable right lung. Electronically Signed   By: Odessa Fleming M.D.   On: 02/08/2023 11:30   DG CHEST PORT 1 VIEW Result Date: 02/08/2023 CLINICAL DATA:  [id number] with left chest empyema and shortness of breath. EXAM: PORTABLE CHEST 1 VIEW COMPARISON:  Chest CT yesterday at 6:17 p.m. FINDINGS: 3:27 a.m. Large air fluid collections in the left hemithorax are noted obscuring the left lung from the level of the aortic knob down. The partially aerated left upper lung field demonstrates increased airspace disease concerning for worsening pneumonia. Nodular airspace disease in the right lower lung field was better seen on CT, but appeared consistent with bronchopneumonia. Remainder of the lungs are clear with emphysematous changes. The cardiac size is normal. The mediastinal configuration is normal. Thoracic cage is intact. IMPRESSION: 1. Large air fluid collections in the left hemithorax obscuring the left lung from the level of the aortic knob down. 2. Increased airspace disease in the partially aerated left upper lung field concerning for worsening pneumonia. 3. Nodular airspace disease in the right lower lung field was better seen on CT, but appeared consistent with bronchopneumonia. Electronically Signed   By: Almira Bar M.D.   On: 02/08/2023 06:02   CT CHEST ABDOMEN PELVIS W CONTRAST Result Date: 02/07/2023 CLINICAL DATA:  Shortness of breath, tachycardia, sepsis. Large hydropneumothorax concerning for empyema. Right upper quadrant pain. EXAM: CT CHEST, ABDOMEN, AND PELVIS WITH CONTRAST TECHNIQUE: Multidetector CT imaging of the chest, abdomen and pelvis was performed following the standard protocol during bolus administration of intravenous contrast. RADIATION DOSE REDUCTION: This exam was performed according to the departmental dose-optimization program which  includes automated exposure control, adjustment of the mA and/or kV according to patient size and/or use of iterative reconstruction technique. CONTRAST:  OMNIPAQUE IOHEXOL 300 MG/ML  SOLN COMPARISON:  Chest x-ray today FINDINGS: CT CHEST FINDINGS Cardiovascular:  Heart is normal size. Aorta is normal caliber. Mediastinum/Nodes: Small scattered mediastinal lymph nodes, none pathologically enlarged. No mediastinal, hilar, or axillary adenopathy. Trachea and esophagus are unremarkable. Thyroid unremarkable. Lungs/Pleura: Mild centrilobular emphysema. Large air and fluid collection noted in the left hemithorax compressing the left lung, measuring 19 x 18 x 10 cm. Separate fluid collection noted medially along the anterior mediastinum measuring 11 x 6.4 x 5.5 cm. Airspace disease in the adjacent left lung. Clustered nodular airspace disease in the right lower lobe with largest nodule measuring up to 1.4 cm. There is airway thickening in the right lower lobe. No effusion on the right. Musculoskeletal: No acute bony abnormality. CT ABDOMEN PELVIS FINDINGS Hepatobiliary: No focal hepatic abnormality. Gallbladder unremarkable. Pancreas: No focal abnormality or ductal dilatation. Spleen: Small low-density area anteriorly in the spleen, likely small cyst. Normal size. No suspicious abnormality. Adrenals/Urinary Tract: Left adrenal nodule measures 1.2 cm and is stable since 2023 most compatible with adenoma. Right adrenal gland and kidneys unremarkable. No stones or hydronephrosis. Urinary bladder unremarkable. Stomach/Bowel: Stomach, large and small bowel grossly unremarkable. Vascular/Lymphatic: Aortoiliac atherosclerosis. No evidence of aneurysm or adenopathy. Reproductive: No visible focal abnormality. Other: No free fluid or free air. Musculoskeletal: No acute bony abnormality. IMPRESSION: Large air and fluid collections noted in the left chest as measured and described above. It is difficult to determine if these  reflect pleural collections and thus hydropneumothorax and empyema or reflect pulmonary collections and abscesses. Compression of the underlying left lung noted in the left lower lobe with patchy airspace disease adjacent in the left upper lobe. Clustered nodular airspace disease in the right lower lobe with airway thickening. This likely reflects bronchopneumonia. No acute findings in the abdomen or pelvis. Emphysema. Electronically Signed   By: Charlett Nose M.D.   On: 02/07/2023 18:43   DG Chest 2 View Result Date: 02/07/2023 CLINICAL DATA:  Cough. EXAM: CHEST - 2 VIEW COMPARISON:  None Available. FINDINGS: AP projection demonstrates dense opacification of the LEFT floor lobe with complete obscuration of the LEFT heart. There is a long air-fluid level within the LEFT lower lobe measuring 13 cm on lateral projection. Findings consistent with hydropneumothorax. RIGHT lung is clear. IMPRESSION: 1. Large hydropneumothorax in the LEFT lower lobe with long air-fluid level. Findings are concerning for infection or neoplasm of the LEFT lower lobe with accompanying empyema or pulmonary abscess. 2. Recommend CT thorax with contrast for further evaluation. Electronically Signed   By: Genevive Bi M.D.   On: 02/07/2023 16:36    Labs:  CBC: Recent Labs    02/08/23 0946 02/09/23 0318 02/10/23 1458 02/11/23 0221  WBC 31.0* 18.5* 17.3* 14.0*  HGB 9.8* 10.2* 11.1* 10.5*  HCT 30.7* 32.0* 34.2* 32.3*  PLT 336 268 260 263    COAGS: Recent Labs    02/08/23 0453 02/08/23 0946 02/10/23 0225  INR 1.3* 1.6* 1.5*  APTT  --  48*  --     BMP: Recent Labs    02/08/23 0238 02/08/23 0946 02/09/23 0318 02/10/23 1143 02/11/23 0221  NA 134*  --  138 136 139  K 3.8  --  2.9* 4.6 3.0*  CL 100  --  108 106 105  CO2 25  --  25 21* 23  GLUCOSE 153*  --  135* 139* 123*  BUN 19  --  18 15 13   CALCIUM 8.2*  --  7.9* 7.8* 8.1*  CREATININE 0.82 0.72 0.58* 0.62 0.71  GFRNONAA >60 >60 >60 >60 >60  LIVER  FUNCTION TESTS: Recent Labs    02/07/23 1528 02/09/23 0318  BILITOT 2.1* 1.2  AST 33 44*  ALT 22 26  ALKPHOS 157* 117  PROT 8.0 5.6*  ALBUMIN 1.8* <1.5*    Assessment and Plan:  64 y.o. male inpatient. Smoker. History of COPD. Presented to the ED at [facility] on 1.20.25 with [facility] and productive cough. Found to be septic. Patient was transferred to [facility]  for further evaluation and possible intervention. Hospital stay complicated by a fib with RVR. Found to have an empyema. ICU placed a left-sided chest tube on 1.21.25 14 Fr. IR placed a second chest tube in the left anterior pleural space on 1.23.24 cultures positive GPC and GPR.  Per EPIC Output has been : 10 ml< 360 ml  Chest tube X 2. Left anterior chest tube with 370 ml of serosanguinous fluid noted to be in atrium. No air leak or kinks noted. Dressing is clean dry and intact.   RN at bedside states that lytics have not been administered as of yet.    IR will continue to follow along - plans per [facility]   Electronically Signed: Alene Mires, NP 02/11/2023, 12:13 PM   I spent a total of 15 Minutes at the patient's bedside AND on the patient's hospital floor or unit, greater than 50% of which was counseling/coordinating care for left anterior chest tube placement

## 2023-02-12 ENCOUNTER — Inpatient Hospital Stay (HOSPITAL_COMMUNITY): Payer: Medicaid Other

## 2023-02-12 DIAGNOSIS — I4891 Unspecified atrial fibrillation: Secondary | ICD-10-CM | POA: Diagnosis present

## 2023-02-12 DIAGNOSIS — I5033 Acute on chronic diastolic (congestive) heart failure: Secondary | ICD-10-CM

## 2023-02-12 DIAGNOSIS — I5021 Acute systolic (congestive) heart failure: Secondary | ICD-10-CM

## 2023-02-12 LAB — CULTURE, BLOOD (SINGLE): Culture: NO GROWTH

## 2023-02-12 LAB — BASIC METABOLIC PANEL
Anion gap: 4 — ABNORMAL LOW (ref 5–15)
BUN: 11 mg/dL (ref 8–23)
CO2: 24 mmol/L (ref 22–32)
Calcium: 7.5 mg/dL — ABNORMAL LOW (ref 8.9–10.3)
Chloride: 109 mmol/L (ref 98–111)
Creatinine, Ser: 0.54 mg/dL — ABNORMAL LOW (ref 0.61–1.24)
GFR, Estimated: >60 mL/min (ref >60)
Glucose, Bld: 133 mg/dL — ABNORMAL HIGH (ref 70–99)
Potassium: 4 mmol/L (ref 3.5–5.1)
Sodium: 137 mmol/L (ref 135–145)

## 2023-02-12 LAB — CBC
HCT: 31.4 % — ABNORMAL LOW (ref 39.0–52.0)
Hemoglobin: 10.2 g/dL — ABNORMAL LOW (ref 13.0–17.0)
MCH: 30 pg (ref 26.0–34.0)
MCHC: 32.5 g/dL (ref 30.0–36.0)
MCV: 92.4 fL (ref 80.0–100.0)
Platelets: 237 10*3/uL (ref 150–400)
RBC: 3.4 MIL/uL — ABNORMAL LOW (ref 4.22–5.81)
RDW: 16 % — ABNORMAL HIGH (ref 11.5–15.5)
WBC: 11.3 10*3/uL — ABNORMAL HIGH (ref 4.0–10.5)
nRBC: 0 % (ref 0.0–0.2)

## 2023-02-12 LAB — HEPARIN LEVEL (UNFRACTIONATED)
Heparin Unfractionated: <0.1 [IU]/mL — ABNORMAL LOW (ref 0.30–0.70)
Heparin Unfractionated: <0.1 [IU]/mL — ABNORMAL LOW (ref 0.30–0.70)
Heparin Unfractionated: <0.1 [IU]/mL — ABNORMAL LOW (ref 0.30–0.70)

## 2023-02-12 LAB — BODY FLUID CULTURE W GRAM STAIN

## 2023-02-12 LAB — MAGNESIUM: Magnesium: 2.2 mg/dL (ref 1.7–2.4)

## 2023-02-12 MED ORDER — NICOTINE 21 MG/24HR TD PT24
21.0000 mg | MEDICATED_PATCH | Freq: Every day | TRANSDERMAL | Status: DC
Start: 2023-02-12 — End: 2023-03-07
  Administered 2023-02-12 – 2023-03-07 (×15): 21 mg via TRANSDERMAL
  Filled 2023-02-12 (×20): qty 1

## 2023-02-12 MED ORDER — HYDROMORPHONE HCL 1 MG/ML IJ SOLN
0.5000 mg | INTRAMUSCULAR | Status: DC | PRN
  Administered 2023-02-12 – 2023-02-19 (×15): 0.5 mg via INTRAVENOUS
  Filled 2023-02-12 (×15): qty 0.5

## 2023-02-12 MED ORDER — HEPARIN BOLUS VIA INFUSION
2000.0000 [IU] | Freq: Once | INTRAVENOUS | Status: AC
  Administered 2023-02-12: 2000 [IU] via INTRAVENOUS
  Filled 2023-02-12: qty 2000

## 2023-02-12 MED ORDER — OXYCODONE HCL 5 MG PO TABS
10.0000 mg | ORAL_TABLET | Freq: Four times a day (QID) | ORAL | Status: DC
  Administered 2023-02-12 – 2023-02-26 (×45): 10 mg via ORAL
  Filled 2023-02-12 (×46): qty 2

## 2023-02-12 NOTE — Progress Notes (Signed)
Cardiology Rounding Note  Cardiologist: None  Chief Complaint: SOB    Patient Profile   63 yo male with COPD and ongoing tobacco use. No h/o heart disease. Admitted with AF with RVR in setting of sepsis with massive left hydro/pneumo thorax. S/p emergent left chest tube with > 2L pus out. 2nd CT placed 1/23 for residual loculated empyema.   Subjective:    Had recurrent A-fib with RVR yesterday, started amiodarone drip and heparin drip.  Creatinine 0.5, potassium 4.0.  BP 117/72  He is somnolent but arousable this morning   Objective:   Weight Range: 83.4 kg Body mass index is 24.94 kg/m.   Vital Signs:   Temp:  [97.4 F (36.3 C)-98.6 F (37 C)] 98.3 F (36.8 C) (01/25 0400) Pulse Rate:  [84-132] 84 (01/25 0543) Resp:  [16-27] 24 (01/25 0543) BP: (108-154)/(64-78) 117/72 (01/25 0517) SpO2:  [91 %-96 %] 95 % (01/25 0543) Weight:  [83.4 kg] 83.4 kg (01/25 0543) Last BM Date : 02/11/23  Weight change: Filed Weights   02/09/23 0600 02/12/23 0543  Weight: 80.1 kg 83.4 kg    Intake/Output:   Intake/Output Summary (Last 24 hours) at 02/12/2023 0844 Last data filed at 02/12/2023 0748 Gross per 24 hour  Intake 2105.62 ml  Output 990 ml  Net 1115.62 ml      Physical Exam   General:  Well appearing. No respiratory difficulty HEENT: normal Neck: supple. no JVD. Carotids 2+ bilat; no bruits. No lymphadenopathy or thyromegaly appreciated. Cor: PMI nondisplaced. Regular rate & rhythm. No rubs, gallops or murmurs. Lungs: + CTs, course anteriorly but overall much improved  Abdomen: soft, nontender, nondistended. No hepatosplenomegaly. No bruits or masses. Good bowel sounds. Extremities: no cyanosis, clubbing, rash, edema Neuro: alert & oriented x 3, cranial nerves grossly intact. moves all 4 extremities w/o difficulty. Affect pleasant.   Telemetry   NSR 90s w/ PVCs. No further Afib, personally reviewed   EKG    N/A   Labs    CBC Recent Labs     02/11/23 0221 02/12/23 0214  WBC 14.0* 11.3*  HGB 10.5* 10.2*  HCT 32.3* 31.4*  MCV 92.3 92.4  PLT 263 237   Basic Metabolic Panel Recent Labs    78/29/56 0221 02/12/23 0214  NA 139 137  K 3.0* 4.0  CL 105 109  CO2 23 24  GLUCOSE 123* 133*  BUN 13 11  CREATININE 0.71 0.54*  CALCIUM 8.1* 7.5*  MG 1.8 2.2   Liver Function Tests No results for input(s): "AST", "ALT", "ALKPHOS", "BILITOT", "PROT", "ALBUMIN" in the last 72 hours.  No results for input(s): "LIPASE", "AMYLASE" in the last 72 hours.  Cardiac Enzymes No results for input(s): "CKTOTAL", "CKMB", "CKMBINDEX", "TROPONINI" in the last 72 hours.  BNP: BNP (last 3 results) No results for input(s): "BNP" in the last 8760 hours.  ProBNP (last 3 results) No results for input(s): "PROBNP" in the last 8760 hours.   D-Dimer No results for input(s): "DDIMER" in the last 72 hours. Hemoglobin A1C No results for input(s): "HGBA1C" in the last 72 hours. Fasting Lipid Panel No results for input(s): "CHOL", "HDL", "LDLCALC", "TRIG", "CHOLHDL", "LDLDIRECT" in the last 72 hours. Thyroid Function Tests No results for input(s): "TSH", "T4TOTAL", "T3FREE", "THYROIDAB" in the last 72 hours.  Invalid input(s): "FREET3"  Other results:   Imaging    No results found.    Medications:     Scheduled Medications:  Chlorhexidine Gluconate Cloth  6 each Topical Q0600  Gerhardt's butt cream   Topical TID   methocarbamol (ROBAXIN) injection  500 mg Intravenous Q8H   Or   methocarbamol  500 mg Oral Q8H   mupirocin ointment   Nasal BID   oxyCODONE  5 mg Oral Q6H   pantoprazole  40 mg Oral Daily   polyethylene glycol  17 g Oral Daily   sodium chloride flush  10 mL Intrapleural Q8H   spironolactone  12.5 mg Oral Daily    Infusions:  amiodarone 30 mg/hr (02/12/23 0310)   ceFEPime (MAXIPIME) IV 2 g (02/12/23 0519)   heparin 1,550 Units/hr (02/12/23 0314)   metronidazole 500 mg (02/11/23 2059)   vancomycin 1,500 mg  (02/12/23 0311)    PRN Medications: acetaminophen **OR** acetaminophen, docusate sodium, HYDROmorphone (DILAUDID) injection, LORazepam, metoprolol tartrate, ondansetron **OR** ondansetron (ZOFRAN) IV, mouth rinse, zolpidem     Assessment/Plan   1. New onset AF with RVR:  - No prior hx of atrial fibrillation.  - Suspect AF with RVR driven by acute illness  -Had recurrent A-fib 1/24, started Amio drip -Continue Amio drip for today, suspect can transition to p.o. amio tomorrow - continue to treat acute lung illness - keep K > 4.0 and Mg > 2.0  -Continue heparin drip for anticoagulation, if tolerating and once chest tubes out and no procedures planned can transition to DOAC  2. Acute sepsis/PNA/empyema/Acute Hypoxic Respiratory Failure:  - massive left hydro/pneumo thorax on admit - s/p emergent Lt CT placement w/ > 2L pus out - s/p additional left throacostomy tube placement for residual loculated empyema on 1/23  - On Cefepime + flagyl + Vanc  - Pleural Fluid Cx growing GPR + GPC. Bcx NGTD - WBC 31>>18>>14. 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 Tobacco use with COPD: - cessation advised   5. Hypokalemia - K 3.0 1/24, added spiro 12.5 mg.  Potassium improved to 4.0 this morning  Length of Stay: 4  Little Ishikawa, MD  02/12/2023, 8:44 AM

## 2023-02-12 NOTE — Progress Notes (Signed)
PHARMACY - ANTICOAGULATION CONSULT NOTE  Pharmacy Consult for IV heparin Indication: atrial fibrillation  No Known Allergies  Patient Measurements: Weight: 83.4 kg (183 lb 13.8 oz) Heparin Dosing Weight: ~ 80 kg  Vital Signs: Temp: 98.2 F (36.8 C) (01/25 1555) Temp Source: Oral (01/25 1555) BP: 119/74 (01/25 1555)  Labs: Recent Labs    02/10/23 0225 02/10/23 1143 02/10/23 1458 02/10/23 1458 02/11/23 0221 02/11/23 1308 02/12/23 0214 02/12/23 1042 02/12/23 1751  HGB  --   --  11.1*   < > 10.5*  --  10.2*  --   --   HCT  --   --  34.2*  --  32.3*  --  31.4*  --   --   PLT  --   --  260  --  263  --  237  --   --   LABPROT 18.7*  --   --   --   --   --   --   --   --   INR 1.5*  --   --   --   --   --   --   --   --   HEPARINUNFRC  --   --   --   --   --    < > <0.10* <0.10* <0.10*  CREATININE  --  0.62  --   --  0.71  --  0.54*  --   --    < > = values in this interval not displayed.    Estimated Creatinine Clearance: 105.1 mL/min (A) (by C-G formula based on SCr of 0.54 mg/dL (L)).   Medical History: History reviewed. No pertinent past medical history.  Medications:  Infusions:   amiodarone 30 mg/hr (02/12/23 1739)   ceFEPime (MAXIPIME) IV Stopped (02/12/23 1241)   heparin 1,750 Units/hr (02/12/23 1504)   metronidazole Stopped (02/12/23 1027)   vancomycin 150 mL/hr at 02/12/23 1504    Assessment: 63 yo male with new onset afib, pharmacy asked to start anticoagulation with IV heparin.  No known anticoagulants PTA or hx bleeding.  Heparin level this morning remains undetectable, no known issues with IV infusion.  No overt bleeding or complications noted.  1/25 PM update: heparin level still undetectable.  No interruptions/issues per RN.    Goal of Therapy:  Heparin level 0.3-0.7 units/ml Monitor platelets by anticoagulation protocol: Yes   Plan:  Heparin bolus 2000 units x 1  Increase IV heparin to 2000 units/hr Check heparin level 6 hrs after  increase Daily heparin level and CBC. F/u plans for oral anticoagulation as appropriate.  Trixie Rude, PharmD Clinical Pharmacist 02/12/2023  6:29 PM

## 2023-02-12 NOTE — Progress Notes (Signed)
PROGRESS NOTE  Dyon Rotert ZDG:[id number] DOB: 1960-09-09 DOA: 02/08/2023 PCP: Patient, No Pcp Per   LOS: 4 days   Brief narrative:  Vito Beg is a 63 y.o. male with past medical history of COPD and a smoker who had initially presented to [facility] hospital on 02/07/2023 with shortness of breath dyspnea on exertion with productive cough.  He was noted to be tachypneic with significant leukocytosis and elevated lactate.  Patient was started on IV antibiotic.  CT scan of the chest however was concerning for empyema versus abscess so he was transferred to [facility] on 02/08/2023 for possible VATS.  Patient however went into A-fib with RVR and hypotension and was admitted to ICU service.  PCCM placed a left-sided chest tube on 02/08/2023 and IR was subsequently consulted to put the second chest tube in the anterior pleural space for additional fluid drainage.  Patient was subsequently transferred out of the ICU to medical service.    Assessment/Plan: Principal Problem:   Sepsis due to pneumonia [facility]) Active Problems:   Empyema (HCC)   Sepsis associated hypotension (HCC)   Need for management of chest tube   Hydropneumothorax   Pneumonia of left lower lobe due to infectious organism   Pressure injury of skin   Atrial fibrillation with rapid ventricular response (HCC)   Acute systolic heart failure (HCC)  Sepsis secondary to pneumonia with left-sided empyema leading to acute hypoxic respiratory failure.  Status post chest tube placement x2.  Second chest tube placement by IR 02/10/2023.On vancomycin, cefepime and Flagyl.  Pleural fluid growing gram-positive rods and gram-positive cocci but no growth noted.  Blood cultures negative so far.  WBC has trended down to 11.3 from 14.0 from initial 35.7.   CT surgery, pulmonary following.   Continue pain management.  Postthrombolytic placement on 02/08/2023.    Follow pulmonary recommendations.    New onset atrial fibrillation with RVR.  Was on  amiodarone drip, cardiology following.  Has been changed to amiodarone p.o.  Continue electrolyte monitoring and supplementation as needed.  Rate controlled at this time.  On heparin drip at this time.  Magnesium of 2.2.  Acute systolic congestive heart failure with moderately reduced ejection fraction.  2D echocardiogram 45 to 50% with global hypokinesis.  Likely tachycardia mediated heart failure in the setting of A-fib.  Cardiology following.  Heart rate has improved at this time.  On heparin drip.  Magnesium 2.2.  History of COPD smoker.  Continue nebulizers.  Denies dyspnea shortness of breath  DVT prophylaxis: SCDs Start: 02/08/23 0845 SCDs Start: 02/08/23 0305   Disposition: Home when okay with pulmonary/chest tubes.  Status is: Inpatient Remains inpatient appropriate because: IV antibiotics chest tubes x 2, empyema, pending clinical improvement    Code Status:     Code Status: Full Code  Family Communication: None at bedside  Consultants: CT surgery PCCM Interventional radiology  Procedures: Left-sided chest tube placement x 2.  Anti-infectives:  Vanco, cefepime and metronidazole IV.  Anti-infectives (From admission, onward)    Start     Dose/Rate Route Frequency Ordered Stop   02/12/23 0200  vancomycin (VANCOREADY) IVPB 1500 mg/300 mL        1,500 mg 150 mL/hr over 120 Minutes Intravenous Every 12 hours 02/11/23 1301     02/11/23 1430  vancomycin (VANCOCIN) 750 mg in sodium chloride 0.9 % 250 mL IVPB  Status:  Discontinued        750 mg 265 mL/hr over 60 Minutes Intravenous  Once 02/11/23  1309 02/11/23 1354   02/11/23 1430  vancomycin (VANCOCIN) 750 mg in sodium chloride 0.9 % 250 mL IVPB        750 mg 250 mL/hr over 60 Minutes Intravenous  Once 02/11/23 1354 02/11/23 1543   02/11/23 1400  vancomycin (VANCOREADY) IVPB 750 mg/150 mL  Status:  Discontinued        750 mg 150 mL/hr over 60 Minutes Intravenous  Once 02/11/23 1301 02/11/23 1309   02/10/23 2130   metroNIDAZOLE (FLAGYL) IVPB 500 mg        500 mg 100 mL/hr over 60 Minutes Intravenous Every 12 hours 02/10/23 1434     02/08/23 1730  metroNIDAZOLE (FLAGYL) IVPB 500 mg  Status:  Discontinued        500 mg 100 mL/hr over 60 Minutes Intravenous Every 8 hours 02/08/23 1637 02/10/23 1434   02/08/23 0400  vancomycin (VANCOREADY) IVPB 750 mg/150 mL  Status:  Discontinued        750 mg 150 mL/hr over 60 Minutes Intravenous Every 12 hours 02/08/23 0324 02/08/23 0328   02/08/23 0400  ceFEPIme (MAXIPIME) 2 g in sodium chloride 0.9 % 100 mL IVPB        2 g 200 mL/hr over 30 Minutes Intravenous Every 8 hours 02/08/23 0324     02/08/23 0400  vancomycin (VANCOCIN) 750 mg in sodium chloride 0.9 % 250 mL IVPB  Status:  Discontinued        750 mg 250 mL/hr over 60 Minutes Intravenous Every 12 hours 02/08/23 0328 02/11/23 1258       Subjective: Today, patient was seen and examined at bedside.  Patient denies interval complaints.  Denies any dyspnea, shortness of breath, nausea, vomiting, chills or rigor.  Inquiring about removing the chest tube.  Wishes to have oxycodone instead of Dilaudid.  Objective: Vitals:   02/12/23 0543 02/12/23 0916  BP:  115/69  Pulse: 84   Resp: (!) 24 17  Temp:  (!) 97.4 F (36.3 C)  SpO2: 95%     Intake/Output Summary (Last 24 hours) at 02/12/2023 1112 Last data filed at 02/12/2023 1000 Gross per 24 hour  Intake 2345.62 ml  Output 990 ml  Net 1355.62 ml   Filed Weights   02/09/23 0600 02/12/23 0543  Weight: 80.1 kg 83.4 kg   Body mass index is 24.94 kg/m.   Physical Exam:  GENERAL: Patient is alert awake and Communicative, not in obvious distress.   HENT: No scleral pallor or icterus. Pupils equally reactive to light. Oral mucosa is moist NECK: is supple, no gross swelling noted. CHEST: Left chest wall with chest tubes x 2.  Decreased breath sounds on the left side. CVS: S1 and S2 heard, no murmur. Regular rate and rhythm.  ABDOMEN: Soft, non-tender,  bowel sounds are present. EXTREMITIES: No edema. CNS: Cranial nerves are intact. No focal motor deficits. SKIN: warm and dry without rashes.  Data Review: I have personally reviewed the following laboratory data and studies,  CBC: Recent Labs  Lab 02/07/23 1528 02/08/23 0238 02/08/23 0946 02/09/23 0318 02/10/23 1458 02/11/23 0221 02/12/23 0214  WBC 35.7* 26.5* 31.0* 18.5* 17.3* 14.0* 11.3*  NEUTROABS 32.3* 23.8*  --   --   --   --   --   HGB 11.3* 10.3* 9.8* 10.2* 11.1* 10.5* 10.2*  HCT 35.3* 31.6* 30.7* 32.0* 34.2* 32.3* 31.4*  MCV 93.9 93.8 94.8 93.6 93.4 92.3 92.4  PLT 396 300 336 268 260 263 237   Basic Metabolic Panel:  Recent Labs  Lab 02/08/23 0238 02/08/23 0946 02/09/23 0318 02/10/23 1143 02/11/23 0221 02/12/23 0214  NA 134*  --  138 136 139 137  K 3.8  --  2.9* 4.6 3.0* 4.0  CL 100  --  108 106 105 109  CO2 25  --  25 21* 23 24  GLUCOSE 153*  --  135* 139* 123* 133*  BUN 19  --  18 15 13 11   CREATININE 0.82 0.72 0.58* 0.62 0.71 0.54*  CALCIUM 8.2*  --  7.9* 7.8* 8.1* 7.5*  MG  --   --  1.9  --  1.8 2.2  PHOS  --   --  2.5  --   --   --    Liver Function Tests: Recent Labs  Lab 02/07/23 1528 02/09/23 0318  AST 33 44*  ALT 22 26  ALKPHOS 157* 117  BILITOT 2.1* 1.2  PROT 8.0 5.6*  ALBUMIN 1.8* <1.5*   Recent Labs  Lab 02/07/23 1528  LIPASE 17   No results for input(s): "AMMONIA" in the last 168 hours. Cardiac Enzymes: No results for input(s): "CKTOTAL", "CKMB", "CKMBINDEX", "TROPONINI" in the last 168 hours. BNP (last 3 results) No results for input(s): "BNP" in the last 8760 hours.  ProBNP (last 3 results) No results for input(s): "PROBNP" in the last 8760 hours.  CBG: No results for input(s): "GLUCAP" in the last 168 hours. Recent Results (from the past 240 hours)  Culture, blood (single)     Status: None   Collection Time: 02/07/23  5:31 PM   Specimen: BLOOD  Result Value Ref Range Status   Specimen Description BLOOD BLOOD LEFT ARM   Final   Special Requests   Final    BOTTLES DRAWN AEROBIC AND ANAEROBIC Blood Culture results may not be optimal due to an inadequate volume of blood received in culture bottles   Culture   Final    NO GROWTH 5 DAYS Performed at [facility], [address] [place], [place] [address]    Report Status 02/12/2023 FINAL  Final  MRSA Next Gen by PCR, Nasal     Status: Abnormal   Collection Time: 02/08/23  9:34 AM   Specimen: Nasal Mucosa; Nasal Swab  Result Value Ref Range Status   MRSA by PCR Next Gen DETECTED (A) NOT DETECTED Final    Comment: RESULT CALLED TO, READ BACK BY AND VERIFIED WITH: RN christine h. [id number] fcp (NOTE) The GeneXpert MRSA Assay (FDA approved for NASAL specimens only), is one component of a comprehensive MRSA colonization surveillance program. It is not intended to diagnose MRSA infection nor to guide or monitor treatment for MRSA infections. Test performance is not FDA approved in patients less than 8 years old. Performed at [facility], [address] [place], [place] [address]   Culture, blood (Routine X 2) w Reflex to ID Panel     Status: None (Preliminary result)   Collection Time: 02/08/23  9:46 AM   Specimen: BLOOD LEFT HAND  Result Value Ref Range Status   Specimen Description BLOOD LEFT HAND  Final   Special Requests   Final    BOTTLES DRAWN AEROBIC AND ANAEROBIC Blood Culture results may not be optimal due to an inadequate volume of blood received in culture bottles   Culture   Final    NO GROWTH 4 DAYS Performed at [facility], [address] [place], [place] [address]    Report Status PENDING  Incomplete  Culture, blood (Routine X 2) w Reflex to ID Panel     Status: None (Preliminary result)   Collection Time: 02/08/23  9:54 AM   Specimen: BLOOD LEFT HAND  Result Value Ref Range Status   Specimen Description BLOOD LEFT HAND  Final   Special Requests   Final    BOTTLES DRAWN AEROBIC AND ANAEROBIC Blood Culture  results may not be optimal due to an inadequate volume of blood received in culture bottles   Culture   Final    NO GROWTH 4 DAYS Performed at [facility], [address], [place], [place] [address]    Report Status PENDING  Incomplete  Body fluid culture w Gram Stain     Status: None (Preliminary result)   Collection Time: 02/08/23 11:04 AM   Specimen: Pleural Fluid  Result Value Ref Range Status   Specimen Description PLEURAL  Final   Special Requests NONE  Final   Gram Stain   Final    ABUNDANT WBC PRESENT, PREDOMINANTLY PMN ABUNDANT GRAM NEGATIVE RODS ABUNDANT GRAM POSITIVE COCCI IN PAIRS IN CHAINS Gram Stain Report Called to,Read Back By and Verified With: C. HICKLING [id number] @ 1519 FH Performed at [facility], [address] [place], [place] [address]    Culture   Final    MODERATE CAMPYLOBACTER SPECIES MODERATE PEPTOSTREPTOCOCCUS MICROS    Report Status PENDING  Incomplete  Fungus Culture With Stain     Status: None (Preliminary result)   Collection Time: 02/08/23 11:04 AM   Specimen: Pleural Fluid  Result Value Ref Range Status   Fungus Stain Final report  Final    Comment: (NOTE) Performed At: [facility] [address] [place], [place] [id number] Jolene Schimke MD OV:[id number]    Fungus (Mycology) Culture PENDING  Incomplete   Fungal Source PLEURAL  Final    Comment: Performed at [facility], [address], [place], [place] [address]  Fungus Culture Result     Status: None   Collection Time: 02/08/23 11:04 AM  Result Value Ref Range Status   Result 1 Comment  Final    Comment: (NOTE) KOH/Calcofluor preparation:  no fungus observed. Performed At: [facility] [address] [place], [place] [id number] Jolene Schimke MD XN:[id number]   Aerobic/Anaerobic Culture w Gram Stain (surgical/deep wound)     Status: None (Preliminary result)   Collection Time: 02/10/23  9:23 AM   Specimen: Abscess  Result Value Ref Range Status    Specimen Description ABSCESS  Final   Special Requests chest  Final   Gram Stain   Final    ABUNDANT WBC PRESENT, PREDOMINANTLY PMN ABUNDANT GRAM POSITIVE COCCI RARE GRAM POSITIVE RODS    Culture   Final    NO GROWTH 2 DAYS NO ANAEROBES ISOLATED; CULTURE IN PROGRESS FOR 5 DAYS Performed at [facility], [address] [place], [place] [address]    Report Status PENDING  Incomplete     Studies: DG Chest Port 1 View Result Date: 02/12/2023 CLINICAL DATA:  Empyema. EXAM: PORTABLE CHEST 1 VIEW COMPARISON:  02/11/2023 FINDINGS: Left pleural drains again noted with residual loculated antral basal left pneumothorax and consolidative airspace disease in the left lung, similar to prior. Patchy airspace disease at the right base is similar. Cardiopericardial silhouette is at upper limits of normal for size. No acute bony abnormality. Telemetry leads overlie the chest. IMPRESSION: 1. No significant interval change. 2. Persistent loculated anterobasilar left pneumothorax with left pleural drains  in place. 3. Persistent consolidative airspace disease in the left lung and patchy airspace disease at the right base. Electronically Signed   By: Kennith Center M.D.   On: 02/12/2023 10:18   DG Chest 1 View Result Date: 02/11/2023 CLINICAL DATA:  Left-sided pleural effusion EXAM: CHEST  1 VIEW COMPARISON:  X-ray and CT 02/09/2023 and older. FINDINGS: Placement of a second pigtail catheter adjacent to the first at the left lung base. Small left basilar pneumothorax is slightly increased. Persistent opacity at the left mid lower lung. Mild linear opacity at the right lung base is slightly increased from previous. Stable cardiopericardial silhouette. No pneumothorax. Overlapping cardiac leads. IMPRESSION: Placement of a second pigtail catheter at the left lung base. Slight increase in the small left pneumothorax. Persistent adjacent opacity. Prostate increase in presumed atelectasis at the right lung base.  Electronically Signed   By: Karen Kays M.D.   On: 02/11/2023 11:09      Joycelyn Das, MD  [facility] 02/12/2023  If 7PM-7AM, please contact night-coverage

## 2023-02-12 NOTE — Progress Notes (Signed)
PHARMACY - ANTICOAGULATION CONSULT NOTE  Pharmacy Consult for IV heparin Indication: atrial fibrillation  No Known Allergies  Patient Measurements: Weight: 80.1 kg (176 lb 9.4 oz) Heparin Dosing Weight: ~ 80 kg  Vital Signs: Temp: 97.7 F (36.5 C) (01/24 2320) Temp Source: Oral (01/24 2320) BP: 121/65 (01/24 2300) Pulse Rate: 88 (01/24 2320)  Labs: Recent Labs    02/09/23 0318 02/10/23 0225 02/10/23 1143 02/10/23 1458 02/11/23 0221 02/11/23 1308 02/11/23 1944 02/12/23 0214  HGB 10.2*  --   --  11.1* 10.5*  --   --  10.2*  HCT 32.0*  --   --  34.2* 32.3*  --   --  31.4*  PLT 268  --   --  260 263  --   --  237  LABPROT  --  18.7*  --   --   --   --   --   --   INR  --  1.5*  --   --   --   --   --   --   HEPARINUNFRC  --   --   --   --   --  <0.10* <0.10* <0.10*  CREATININE 0.58*  --  0.62  --  0.71  --   --   --     Estimated Creatinine Clearance: 105.1 mL/min (by C-G formula based on SCr of 0.71 mg/dL).   Medical History: History reviewed. No pertinent past medical history.  Medications:  Infusions:   amiodarone 30 mg/hr (02/11/23 1930)   ceFEPime (MAXIPIME) IV 2 g (02/11/23 2006)   heparin 1,350 Units/hr (02/11/23 2101)   metronidazole 500 mg (02/11/23 2059)   vancomycin      Assessment: 63 yo male with new onset afib, pharmacy asked to start anticoagulation with IV heparin.  No known anticoagulants PTA or hx bleeding.  1/25 AM update:  Heparin level sub-therapeutic  Hgb stable  Goal of Therapy:  Heparin level 0.3-0.7 units/ml Monitor platelets by anticoagulation protocol: Yes   Plan:  Increase IV heparin to 1550 units/hr Check heparin level 6-8 hrs after increase Daily heparin level and CBC. F/u plans for oral anticoagulation as appropriate.  Abran Duke, PharmD, BCPS Clinical Pharmacist Phone: [phone]

## 2023-02-12 NOTE — Plan of Care (Signed)
Problem: Respiratory: Goal: Ability to maintain adequate ventilation will improve Outcome: Progressing

## 2023-02-12 NOTE — Progress Notes (Signed)
PHARMACY - ANTICOAGULATION CONSULT NOTE  Pharmacy Consult for IV heparin Indication: atrial fibrillation  No Known Allergies  Patient Measurements: Weight: 83.4 kg (183 lb 13.8 oz) Heparin Dosing Weight: ~ 80 kg  Vital Signs: Temp: 97.4 F (36.3 C) (01/25 0916) Temp Source: Oral (01/25 0916) BP: 115/69 (01/25 0916) Pulse Rate: 84 (01/25 0543)  Labs: Recent Labs    02/10/23 0225 02/10/23 1143 02/10/23 1458 02/10/23 1458 02/11/23 0221 02/11/23 1308 02/11/23 1944 02/12/23 0214 02/12/23 1042  HGB  --   --  11.1*   < > 10.5*  --   --  10.2*  --   HCT  --   --  34.2*  --  32.3*  --   --  31.4*  --   PLT  --   --  260  --  263  --   --  237  --   LABPROT 18.7*  --   --   --   --   --   --   --   --   INR 1.5*  --   --   --   --   --   --   --   --   HEPARINUNFRC  --   --   --   --   --    < > <0.10* <0.10* <0.10*  CREATININE  --  0.62  --   --  0.71  --   --  0.54*  --    < > = values in this interval not displayed.    Estimated Creatinine Clearance: 105.1 mL/min (A) (by C-G formula based on SCr of 0.54 mg/dL (L)).   Medical History: History reviewed. No pertinent past medical history.  Medications:  Infusions:   amiodarone 30 mg/hr (02/12/23 0922)   ceFEPime (MAXIPIME) IV 2 g (02/12/23 0519)   heparin 1,550 Units/hr (02/12/23 0859)   metronidazole 500 mg (02/12/23 0925)   vancomycin 1,500 mg (02/12/23 1610)    Assessment: 63 yo male with new onset afib, pharmacy asked to start anticoagulation with IV heparin.  No known anticoagulants PTA or hx bleeding.  Heparin level this morning remains undetectable, no known issues with IV infusion.  No overt bleeding or complications noted.  Goal of Therapy:  Heparin level 0.3-0.7 units/ml Monitor platelets by anticoagulation protocol: Yes   Plan:  Increase IV heparin to 1750 units/hr Check heparin level 6-8 hrs after increase Daily heparin level and CBC. F/u plans for oral anticoagulation as appropriate.   [name], Colon Flattery, BCCP Clinical Pharmacist  02/12/2023 11:30 AM   [facility] pharmacy phone numbers are listed on [email or web address]

## 2023-02-13 ENCOUNTER — Inpatient Hospital Stay (HOSPITAL_COMMUNITY): Payer: Medicaid Other

## 2023-02-13 LAB — BASIC METABOLIC PANEL
Anion gap: 6 (ref 5–15)
BUN: 9 mg/dL (ref 8–23)
CO2: 24 mmol/L (ref 22–32)
Calcium: 7.5 mg/dL — ABNORMAL LOW (ref 8.9–10.3)
Chloride: 104 mmol/L (ref 98–111)
Creatinine, Ser: 0.65 mg/dL (ref 0.61–1.24)
GFR, Estimated: >60 mL/min (ref >60)
Glucose, Bld: 158 mg/dL — ABNORMAL HIGH (ref 70–99)
Potassium: 3.7 mmol/L (ref 3.5–5.1)
Sodium: 134 mmol/L — ABNORMAL LOW (ref 135–145)

## 2023-02-13 LAB — CULTURE, BLOOD (ROUTINE X 2)
Culture: NO GROWTH
Culture: NO GROWTH

## 2023-02-13 LAB — HEPARIN LEVEL (UNFRACTIONATED)
Heparin Unfractionated: 0.12 [IU]/mL — ABNORMAL LOW (ref 0.30–0.70)
Heparin Unfractionated: 0.63 [IU]/mL (ref 0.30–0.70)
Heparin Unfractionated: <0.1 [IU]/mL — ABNORMAL LOW (ref 0.30–0.70)

## 2023-02-13 LAB — MAGNESIUM: Magnesium: 1.9 mg/dL (ref 1.7–2.4)

## 2023-02-13 MED ORDER — AMIODARONE HCL 200 MG PO TABS: 200.0000 mg | ORAL_TABLET | Freq: Every day | ORAL | Status: DC

## 2023-02-13 MED ORDER — HYDROXYZINE HCL 25 MG PO TABS
25.0000 mg | ORAL_TABLET | Freq: Three times a day (TID) | ORAL | Status: DC | PRN
  Administered 2023-02-13 – 2023-03-05 (×20): 25 mg via ORAL
  Filled 2023-02-13 (×25): qty 1

## 2023-02-13 MED ORDER — AMIODARONE HCL 200 MG PO TABS
200.0000 mg | ORAL_TABLET | Freq: Two times a day (BID) | ORAL | Status: DC
  Administered 2023-02-13 – 2023-02-14 (×3): 200 mg via ORAL
  Filled 2023-02-13 (×3): qty 1

## 2023-02-13 MED ORDER — ZOLPIDEM TARTRATE 5 MG PO TABS
10.0000 mg | ORAL_TABLET | Freq: Every evening | ORAL | Status: DC | PRN
  Administered 2023-02-13 – 2023-03-01 (×5): 10 mg via ORAL
  Filled 2023-02-13 (×5): qty 2

## 2023-02-13 NOTE — Progress Notes (Signed)
Cardiology Rounding Note  Cardiologist: None  Chief Complaint: SOB    Patient Profile   63 yo male with COPD and ongoing tobacco use. No h/o heart disease. Admitted with AF with RVR in setting of sepsis with massive left hydro/pneumo thorax. S/p emergent left chest tube with > 2L pus out. 2nd CT placed 1/23 for residual loculated empyema.   Subjective:    BP 122/69.  Creatinine 0.65.  Denies any chest pain or dyspnea   Objective:   Weight Range: 83.3 kg Body mass index is 24.91 kg/m.   Vital Signs:   Temp:  [97.4 F (36.3 C)-98.4 F (36.9 C)] 97.7 F (36.5 C) (01/26 0728) Pulse Rate:  [85-93] 90 (01/26 0516) Resp:  [17-20] 18 (01/26 0728) BP: (82-135)/(58-74) 122/69 (01/26 0728) SpO2:  [90 %-94 %] 93 % (01/26 0516) Weight:  [83.3 kg] 83.3 kg (01/26 0429) Last BM Date : 02/11/23  Weight change: Filed Weights   02/09/23 0600 02/12/23 0543 02/13/23 0429  Weight: 80.1 kg 83.4 kg 83.3 kg    Intake/Output:   Intake/Output Summary (Last 24 hours) at 02/13/2023 0913 Last data filed at 02/13/2023 0730 Gross per 24 hour  Intake 1987.32 ml  Output 1970 ml  Net 17.32 ml      Physical Exam   General:  Well appearing. No respiratory difficulty HEENT: normal Neck: supple. no JVD. Cor: Regular rate & rhythm. No rubs, gallops or murmurs. Lungs: + CTs, scattered wheezing Abdomen: soft, nontender, nondistended. s. Extremities: no , edema Neuro: alert & oriented x 3   Telemetry   NSR 90s w/ PVCs. No further Afib, personally reviewed   EKG    N/A   Labs    CBC Recent Labs    02/11/23 0221 02/12/23 0214  WBC 14.0* 11.3*  HGB 10.5* 10.2*  HCT 32.3* 31.4*  MCV 92.3 92.4  PLT 263 237   Basic Metabolic Panel Recent Labs    16/10/96 0214 02/13/23 0204  NA 137 134*  K 4.0 3.7  CL 109 104  CO2 24 24  GLUCOSE 133* 158*  BUN 11 9  CREATININE 0.54* 0.65  CALCIUM 7.5* 7.5*  MG 2.2 1.9   Liver Function Tests No results for input(s): "AST", "ALT",  "ALKPHOS", "BILITOT", "PROT", "ALBUMIN" in the last 72 hours.  No results for input(s): "LIPASE", "AMYLASE" in the last 72 hours.  Cardiac Enzymes No results for input(s): "CKTOTAL", "CKMB", "CKMBINDEX", "TROPONINI" in the last 72 hours.  BNP: BNP (last 3 results) No results for input(s): "BNP" in the last 8760 hours.  ProBNP (last 3 results) No results for input(s): "PROBNP" in the last 8760 hours.   D-Dimer No results for input(s): "DDIMER" in the last 72 hours. Hemoglobin A1C No results for input(s): "HGBA1C" in the last 72 hours. Fasting Lipid Panel No results for input(s): "CHOL", "HDL", "LDLCALC", "TRIG", "CHOLHDL", "LDLDIRECT" in the last 72 hours. Thyroid Function Tests No results for input(s): "TSH", "T4TOTAL", "T3FREE", "THYROIDAB" in the last 72 hours.  Invalid input(s): "FREET3"  Other results:   Imaging    No results found.    Medications:     Scheduled Medications:  Chlorhexidine Gluconate Cloth  6 each Topical Q0600   Gerhardt's butt cream   Topical TID   methocarbamol (ROBAXIN) injection  500 mg Intravenous Q8H   Or   methocarbamol  500 mg Oral Q8H   mupirocin ointment   Nasal BID   nicotine  21 mg Transdermal Daily   oxyCODONE  10 mg  Oral Q6H   pantoprazole  40 mg Oral Daily   polyethylene glycol  17 g Oral Daily   sodium chloride flush  10 mL Intrapleural Q8H   spironolactone  12.5 mg Oral Daily    Infusions:  amiodarone 30 mg/hr (02/13/23 0722)   ceFEPime (MAXIPIME) IV 2 g (02/13/23 0317)   heparin 2,400 Units/hr (02/13/23 0319)   metronidazole 500 mg (02/12/23 2215)   vancomycin 1,500 mg (02/13/23 0108)    PRN Medications: acetaminophen **OR** acetaminophen, docusate sodium, HYDROmorphone (DILAUDID) injection, LORazepam, metoprolol tartrate, ondansetron **OR** ondansetron (ZOFRAN) IV, mouth rinse, zolpidem     Assessment/Plan   1. New onset AF with RVR:  - No prior hx of atrial fibrillation.  - Suspect AF with RVR driven by  acute illness  -Had recurrent A-fib 1/24, started Amio drip -Maintaining sinus rhythm, will transition to p.o. amiodarone today.  Will plan short-term course of amiodarone as he recovers from acute illness.  Plan amiodarone 200 mg twice daily x 2 weeks, then 200 mg daily x 2 weeks, then discontinue. - continue to treat acute lung illness - keep K > 4.0 and Mg > 2.0  -Continue heparin drip for anticoagulation, if tolerating and once chest tubes out and no procedures planned can transition to DOAC  2. Acute sepsis/PNA/empyema/Acute Hypoxic Respiratory Failure:  - massive left hydro/pneumo thorax on admit - s/p emergent Lt CT placement w/ > 2L pus out - s/p additional left throacostomy tube placement for residual loculated empyema on 1/23  - On Cefepime + flagyl + Vanc  - Pleural Fluid Cx growing GPR + GPC. Bcx NGTD - WBC 31>>18>>14. 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 Tobacco use with COPD: - cessation advised   5. Hypokalemia - K 3.0 1/24, added spiro 12.5 mg.  Potassium improved  Length of Stay: 5  Little Ishikawa, MD  02/13/2023, 9:13 AM

## 2023-02-13 NOTE — Progress Notes (Signed)
Patient refusing to ambulate this morning. RN able to get patient to stand and take a few steps this afternoon but refused to walk further than that. RN will attempt ambulation again.

## 2023-02-13 NOTE — Progress Notes (Addendum)
PHARMACY - ANTICOAGULATION CONSULT NOTE  Pharmacy Consult for IV heparin Indication: atrial fibrillation  No Known Allergies  Patient Measurements: Weight: 83.3 kg (183 lb 10.3 oz) Heparin Dosing Weight: ~ 80 kg  Vital Signs: Temp: 98 F (36.7 C) (01/26 1145) Temp Source: Oral (01/26 1145) BP: 105/50 (01/26 1145) Pulse Rate: 89 (01/26 1145)  Labs: Recent Labs    02/10/23 1458 02/11/23 0221 02/11/23 1308 02/12/23 0214 02/12/23 1042 02/12/23 1751 02/13/23 0016 02/13/23 0204 02/13/23 1048  HGB 11.1* 10.5*  --  10.2*  --   --   --   --   --   HCT 34.2* 32.3*  --  31.4*  --   --   --   --   --   PLT 260 263  --  237  --   --   --   --   --   HEPARINUNFRC  --   --    < > <0.10*   < > <0.10* <0.10*  --  0.12*  CREATININE  --  0.71  --  0.54*  --   --   --  0.65  --    < > = values in this interval not displayed.    Estimated Creatinine Clearance: 105.1 mL/min (by C-G formula based on SCr of 0.65 mg/dL).   Medical History: History reviewed. No pertinent past medical history.  Medications:  Infusions:   ceFEPime (MAXIPIME) IV 2 g (02/13/23 1247)   heparin 2,400 Units/hr (02/13/23 1120)   metronidazole 500 mg (02/13/23 1108)   vancomycin 1,500 mg (02/13/23 0108)    Assessment: 63 yo male with new onset afib, initially converted to NSR following placement of chest tube however patient had converted back to afib. Pharmacy asked to start anticoagulation with IV heparin.  No known anticoagulants PTA or hx bleeding.  HL remains subtherapeutic, however now detectable at 2400 units/hr. CBC was not checked this AM but no s/sx new bleeding noted. Spoke with RN no issues with line, no new s/sx bleeding. Confirmed no pauses and line is connected to patient.  Goal of Therapy:  Heparin level 0.3-0.7 units/ml Monitor platelets by anticoagulation protocol: Yes   Plan:  Increase IV heparin to 2600 units/hr Check heparin level 6-8 hrs after increase Daily heparin level and  CBC. F/u plans for oral anticoagulation with resolution of empyema  Rutherford Nail, PharmD PGY2 Critical Care Pharmacy Resident Phone: [phone]

## 2023-02-13 NOTE — Plan of Care (Signed)
Problem: Fluid Volume: Goal: Hemodynamic stability will improve Outcome: Progressing   Problem: Clinical Measurements: Goal: Diagnostic test results will improve Outcome: Progressing

## 2023-02-13 NOTE — Progress Notes (Signed)
Critical care MD increased suction on patient's chest tubes to -40.

## 2023-02-13 NOTE — Progress Notes (Signed)
PHARMACY - ANTICOAGULATION CONSULT NOTE  Pharmacy Consult for IV heparin Indication: atrial fibrillation  No Known Allergies  Patient Measurements: Weight: 83.4 kg (183 lb 13.8 oz) Heparin Dosing Weight: ~ 80 kg  Vital Signs: Temp: 98.4 F (36.9 C) (01/26 0033) Temp Source: Oral (01/26 0033) BP: 135/73 (01/26 0033) Pulse Rate: 85 (01/26 0033)  Labs: Recent Labs    02/10/23 1143 02/10/23 1458 02/10/23 1458 02/11/23 0221 02/11/23 1308 02/12/23 0214 02/12/23 1042 02/12/23 1751 02/13/23 0016  HGB  --  11.1*   < > 10.5*  --  10.2*  --   --   --   HCT  --  34.2*  --  32.3*  --  31.4*  --   --   --   PLT  --  260  --  263  --  237  --   --   --   HEPARINUNFRC  --   --   --   --    < > <0.10* <0.10* <0.10* <0.10*  CREATININE 0.62  --   --  0.71  --  0.54*  --   --   --    < > = values in this interval not displayed.    Estimated Creatinine Clearance: 105.1 mL/min (A) (by C-G formula based on SCr of 0.54 mg/dL (L)).   Medical History: History reviewed. No pertinent past medical history.  Medications:  Infusions:   amiodarone 30 mg/hr (02/12/23 1739)   ceFEPime (MAXIPIME) IV 2 g (02/12/23 2059)   heparin 2,000 Units/hr (02/12/23 2108)   metronidazole 500 mg (02/12/23 2215)   vancomycin 1,500 mg (02/13/23 0108)    Assessment: 63 yo male with new onset afib, pharmacy asked to start anticoagulation with IV heparin.  No known anticoagulants PTA or hx bleeding.  1/26 AM update:  Heparin level sub-therapeutic   Goal of Therapy:  Heparin level 0.3-0.7 units/ml Monitor platelets by anticoagulation protocol: Yes   Plan:  Increase IV heparin to 2400 units/hr Check heparin level 6-8 hrs after increase Daily heparin level and CBC. F/u plans for oral anticoagulation as appropriate.  Abran Duke, PharmD, BCPS Clinical Pharmacist Phone: [phone]

## 2023-02-13 NOTE — Plan of Care (Signed)

## 2023-02-13 NOTE — Progress Notes (Signed)
PHARMACY - ANTICOAGULATION CONSULT NOTE  Pharmacy Consult for IV heparin Indication: atrial fibrillation  No Known Allergies  Patient Measurements: Weight: 83.3 kg (183 lb 10.3 oz) Heparin Dosing Weight: ~ 80 kg  Vital Signs: Temp: 97.5 F (36.4 C) (01/26 2013) Temp Source: Oral (01/26 2013) BP: 108/69 (01/26 2013) Pulse Rate: 96 (01/26 2013)  Labs: Recent Labs    02/11/23 0221 02/11/23 1308 02/12/23 0214 02/12/23 1042 02/13/23 0016 02/13/23 0204 02/13/23 1048 02/13/23 1950  HGB 10.5*  --  10.2*  --   --   --   --   --   HCT 32.3*  --  31.4*  --   --   --   --   --   PLT 263  --  237  --   --   --   --   --   HEPARINUNFRC  --    < > <0.10*   < > <0.10*  --  0.12* 0.63  CREATININE 0.71  --  0.54*  --   --  0.65  --   --    < > = values in this interval not displayed.    Estimated Creatinine Clearance: 105.1 mL/min (by C-G formula based on SCr of 0.65 mg/dL).   Medical History: History reviewed. No pertinent past medical history.  Medications:  Infusions:   ceFEPime (MAXIPIME) IV 2 g (02/13/23 2021)   heparin 2,600 Units/hr (02/13/23 1403)   metronidazole 500 mg (02/13/23 1108)   vancomycin 1,500 mg (02/13/23 1554)    Assessment: 63 yo male with new onset afib, initially converted to NSR following placement of chest tube however patient had converted back to afib. Pharmacy asked to start anticoagulation with IV heparin.  No known anticoagulants PTA or hx bleeding.  Heparin level therapeutic on 2600 units/hr  Goal of Therapy:  Heparin level 0.3-0.7 units/ml Monitor platelets by anticoagulation protocol: Yes   Plan:  Continue heparin gtt at 2600 units/hr Daily heparin level, CBC, s/s bleeding  Daylene Posey, PharmD, [facility] Clinical Pharmacist ED Pharmacist Phone # [phone] 02/13/2023 8:49 PM

## 2023-02-13 NOTE — Progress Notes (Signed)
NAME:  Mathew Wyatt, MRN:  [id number], DOB:  1961/01/02, LOS: 5 ADMISSION DATE:  02/08/2023, CONSULTATION DATE: 02/08/2023  REFERRING MD: Add, CHIEF COMPLAINT: Hypotension, atrial fibrillation, large empyema on left with hydropneumothorax  History of Present Illness:  63 year old male with past medical history of tobacco use, COPD who presented to [facility] on 1/20 with cough and shortness of breath. Reportedly having worsening symptoms over the preceding 2 weeks. He denied having fever, chills, chest pain, n/v/d. He was found to be tachypneic with significant leukocytosis to 35.7, lactifc 4.4. he was started on IV antibiotics. He had CT chest which demonstrated a large left-sided loculated hydropneumothorax concerning for empyema or abscess. He was transferred to [facility] on 1/21 initially to hospitalist for CT surgery consult and possible VATS. On 1/21 he developed atrial fibrillation with RVR and hypotension and was transferred to [facility] and admitted to ICU.   Pertinent  Medical History  History reviewed. No pertinent past medical history.   Significant Hospital Events: Including procedures, antibiotic start and stop dates in addition to other pertinent events   1/20: [facility] ED with SOB. CT with large left hydropneumo c/w empyema vs abscess.  1/21: TCTS defer VATS. ICU for afib rvr hypotension, started on amiodarone. CT placed with immediate 2L purulent output. Initial tube lytics with additional 300cc output.  1/22: repeat CT chest today to eval for need of additional CT vs more lytics vs observation.  1/23 new chest tube placed by IR, cultures growing Campylobacter and Peptostreptococcus  Interim History / Subjective:  NAEON.  Minimal chest tube output.  No leak, no active airleak seen.  Chest x-ray reveals persistent loculated inferior pneumothorax on the left.  Patient frustrated with recommendation to continue chest tubes.  Objective   Blood pressure (!) 105/50, pulse 89, temperature 98 F  (36.7 C), temperature source Oral, resp. rate 18, weight 83.3 kg, SpO2 94%.        Intake/Output Summary (Last 24 hours) at 02/13/2023 1526 Last data filed at 02/13/2023 1412 Gross per 24 hour  Intake 1149.7 ml  Output 3140 ml  Net -1990.3 ml   Filed Weights   02/09/23 0600 02/12/23 0543 02/13/23 0429  Weight: 80.1 kg 83.4 kg 83.3 kg    Examination: General: acute on chronically ill appearing male, lying in bed, in no acute distress  HENT: [place]/AT, anicteric sclera,, mmm Lungs: rhonchi and diminished L, clear right. 2 chest tubes on left with minimal drainage, no noticeable airleak at normal respiration or with forced expiration Cardiovascular: RRR, no JVD or peripheral edema  Abdomen: flat, soft, non-tender Extremities: Warm and dry Neuro: alert and oriented, non focal exam   Assessment & Plan:   Sepsis 2/2 left empyema - campylobacter and peptostreptococcus Pneumothorax due to lung entrapment, lack of lung expansion Clear respiratory source. Based on recent 2 week history of cough, sob, may have started as pna and developed empyema. Denies alcohol use or chronic dysphagia. Significant leukocytosis to 35. Lactic 4.4. CT chest with large left sided hydropneumothorax consistent with empyema. 1/21 CT placed with 2L out. Improving wbc. Echo without vegetation.  New 2nd anterior chest tube placed by IR under CT guidance on 1/23. - Cont chest tubes to suction, increased to -40 1/26 - Chest x-ray shows resolution of effusion, persistent pneumothorax - Daily CXR while chest tube in place - con't broad antibiotics with vanc/cefepime/flagyl  - Recommend ID consult given multiorganism empyema for recommendation on antibiotic therapy moving forward  PCCM to follow for chest tube.  Best Practice (right click and "Reselect all SmartList Selections" daily)   Per primary    Karren Burly, MD [facility] For contact details, please see AMION After  1900, please call [facility] for cross coverage needs 02/13/2023, 3:26 PM

## 2023-02-13 NOTE — Progress Notes (Signed)
PROGRESS NOTE  Mathew Wyatt ZOX:[id number] DOB: 1960/02/05 DOA: 02/08/2023 PCP: Patient, No Pcp Per   LOS: 5 days   Brief narrative:  Mathew Wyatt is a 63 y.o. male with past medical history of COPD and a smoker who had initially presented to [facility] on 02/07/2023 with shortness of breath dyspnea on exertion with productive cough.  He was noted to be tachypneic with significant leukocytosis and elevated lactate.  Patient was started on IV antibiotic.  CT scan of the chest however was concerning for empyema versus abscess so he was transferred to [facility] on 02/08/2023 for possible VATS.  Patient however went into A-fib with RVR and hypotension and was admitted to ICU service.  PCCM placed a left-sided chest tube on 02/08/2023 and IR was subsequently consulted to put the second chest tube in the anterior pleural space for additional fluid drainage.  Patient was subsequently transferred out of the ICU to medical service.    Assessment/Plan: Principal Problem:   Sepsis due to pneumonia [facility]) Active Problems:   Empyema (HCC)   Sepsis associated hypotension (HCC)   Need for management of chest tube   Hydropneumothorax   Pneumonia of left lower lobe due to infectious organism   Pressure injury of skin   Atrial fibrillation with rapid ventricular response (HCC)   Acute systolic heart failure (HCC)  Sepsis secondary to pneumonia with left-sided empyema leading to acute hypoxic respiratory failure.  Status post chest tube placement x2.  Second chest tube placement by IR 02/10/2023.On vancomycin, cefepime and Flagyl.  Pleural fluid growing gram-positive rods and gram-positive cocci but no growth noted.  Blood cultures negative so far in 5 days..  WBC has trended down to 11.3 from 14.0 from initial 35.7.   CT surgery, pulmonary following.   Continue pain management.  Postthrombolytic placement on 02/08/2023.    Follow pulmonary recommendations on further chest tube management..    New onset  atrial fibrillation with RVR.  Was on amiodarone drip, cardiology following.  Has been changed to amiodarone p.o.  Continue electrolyte monitoring and supplementation as needed.  Rate controlled at this time.  On heparin drip at this time.  Magnesium of 2.2.  Acute systolic congestive heart failure with moderately reduced ejection fraction.  2D echocardiogram 45 to 50% with global hypokinesis.  Likely tachycardia mediated heart failure in the setting of A-fib.  Cardiology following.  Heart rate has improved at this time.  On heparin drip.  Latest magnesium 1.9.  History of COPD smoker.  Continue nebulizers.  Denies dyspnea shortness of breath  Discomfort insomnia anxiety.  Stated that Ambien did not really help.  He says he cannot really relax.  Will add muscle relaxant, Atarax.  Increase the dose of Ambien.  DVT prophylaxis: SCDs Start: 02/08/23 0845 SCDs Start: 02/08/23 0305   Disposition: Home when okay with pulmonary/chest tubes.  Status is: Inpatient Remains inpatient appropriate because: IV antibiotics chest tubes x 2, empyema, pending clinical improvement    Code Status:     Code Status: Full Code  Family Communication: None at bedside  Consultants: CT surgery PCCM Interventional radiology  Procedures: Left-sided chest tube placement x 2.  Anti-infectives:  Vanco, cefepime and metronidazole IV.  Anti-infectives (From admission, onward)    Start     Dose/Rate Route Frequency Ordered Stop   02/12/23 0200  vancomycin (VANCOREADY) IVPB 1500 mg/300 mL        1,500 mg 150 mL/hr over 120 Minutes Intravenous Every 12 hours 02/11/23 1301  02/11/23 1430  vancomycin (VANCOCIN) 750 mg in sodium chloride 0.9 % 250 mL IVPB  Status:  Discontinued        750 mg 265 mL/hr over 60 Minutes Intravenous  Once 02/11/23 1309 02/11/23 1354   02/11/23 1430  vancomycin (VANCOCIN) 750 mg in sodium chloride 0.9 % 250 mL IVPB        750 mg 250 mL/hr over 60 Minutes Intravenous  Once  02/11/23 1354 02/11/23 1543   02/11/23 1400  vancomycin (VANCOREADY) IVPB 750 mg/150 mL  Status:  Discontinued        750 mg 150 mL/hr over 60 Minutes Intravenous  Once 02/11/23 1301 02/11/23 1309   02/10/23 2130  metroNIDAZOLE (FLAGYL) IVPB 500 mg        500 mg 100 mL/hr over 60 Minutes Intravenous Every 12 hours 02/10/23 1434     02/08/23 1730  metroNIDAZOLE (FLAGYL) IVPB 500 mg  Status:  Discontinued        500 mg 100 mL/hr over 60 Minutes Intravenous Every 8 hours 02/08/23 1637 02/10/23 1434   02/08/23 0400  vancomycin (VANCOREADY) IVPB 750 mg/150 mL  Status:  Discontinued        750 mg 150 mL/hr over 60 Minutes Intravenous Every 12 hours 02/08/23 0324 02/08/23 0328   02/08/23 0400  ceFEPIme (MAXIPIME) 2 g in sodium chloride 0.9 % 100 mL IVPB        2 g 200 mL/hr over 30 Minutes Intravenous Every 8 hours 02/08/23 0324     02/08/23 0400  vancomycin (VANCOCIN) 750 mg in sodium chloride 0.9 % 250 mL IVPB  Status:  Discontinued        750 mg 250 mL/hr over 60 Minutes Intravenous Every 12 hours 02/08/23 0328 02/11/23 1258       Subjective: Today, patient was seen and examined at bedside.  Patient states that he feels anxious very uncomfortable and was unable to reach in the nighttime.  Wishes to go home and remove the tubes if not necessary.  Denies any nausea, vomiting, fever, chills or rigor.  Objective: Vitals:   02/13/23 0516 02/13/23 0728  BP: 116/70 122/69  Pulse: 90   Resp:  18  Temp:  97.7 F (36.5 C)  SpO2: 93%     Intake/Output Summary (Last 24 hours) at 02/13/2023 1102 Last data filed at 02/13/2023 0730 Gross per 24 hour  Intake 1747.32 ml  Output 1940 ml  Net -192.68 ml   Filed Weights   02/09/23 0600 02/12/23 0543 02/13/23 0429  Weight: 80.1 kg 83.4 kg 83.3 kg   Body mass index is 24.91 kg/m.   Physical Exam:  GENERAL: Patient is alert awake and Communicative, not in obvious distress.  Appears to be mildly anxious, HENT: No scleral pallor or icterus.  Pupils equally reactive to light. Oral mucosa is moist NECK: is supple, no gross swelling noted. CHEST: Left chest wall with chest tubes x 2.  Decreased breath sounds on the left side. CVS: S1 and S2 heard, no murmur. Regular rate and rhythm.  ABDOMEN: Soft, non-tender, bowel sounds are present. EXTREMITIES: No edema. CNS: Cranial nerves are intact. No focal motor deficits. SKIN: warm and dry without rashes.  Data Review: I have personally reviewed the following laboratory data and studies,  CBC: Recent Labs  Lab 02/07/23 1528 02/08/23 0238 02/08/23 0946 02/09/23 0318 02/10/23 1458 02/11/23 0221 02/12/23 0214  WBC 35.7* 26.5* 31.0* 18.5* 17.3* 14.0* 11.3*  NEUTROABS 32.3* 23.8*  --   --   --   --   --  HGB 11.3* 10.3* 9.8* 10.2* 11.1* 10.5* 10.2*  HCT 35.3* 31.6* 30.7* 32.0* 34.2* 32.3* 31.4*  MCV 93.9 93.8 94.8 93.6 93.4 92.3 92.4  PLT 396 300 336 268 260 263 237   Basic Metabolic Panel: Recent Labs  Lab 02/09/23 0318 02/10/23 1143 02/11/23 0221 02/12/23 0214 02/13/23 0204  NA 138 136 139 137 134*  K 2.9* 4.6 3.0* 4.0 3.7  CL 108 106 105 109 104  CO2 25 21* 23 24 24   GLUCOSE 135* 139* 123* 133* 158*  BUN 18 15 13 11 9   CREATININE 0.58* 0.62 0.71 0.54* 0.65  CALCIUM 7.9* 7.8* 8.1* 7.5* 7.5*  MG 1.9  --  1.8 2.2 1.9  PHOS 2.5  --   --   --   --    Liver Function Tests: Recent Labs  Lab 02/07/23 1528 02/09/23 0318  AST 33 44*  ALT 22 26  ALKPHOS 157* 117  BILITOT 2.1* 1.2  PROT 8.0 5.6*  ALBUMIN 1.8* <1.5*   Recent Labs  Lab 02/07/23 1528  LIPASE 17   No results for input(s): "AMMONIA" in the last 168 hours. Cardiac Enzymes: No results for input(s): "CKTOTAL", "CKMB", "CKMBINDEX", "TROPONINI" in the last 168 hours. BNP (last 3 results) No results for input(s): "BNP" in the last 8760 hours.  ProBNP (last 3 results) No results for input(s): "PROBNP" in the last 8760 hours.  CBG: No results for input(s): "GLUCAP" in the last 168 hours. Recent  Results (from the past 240 hours)  Culture, blood (single)     Status: None   Collection Time: 02/07/23  5:31 PM   Specimen: BLOOD  Result Value Ref Range Status   Specimen Description BLOOD BLOOD LEFT ARM  Final   Special Requests   Final    BOTTLES DRAWN AEROBIC AND ANAEROBIC Blood Culture results may not be optimal due to an inadequate volume of blood received in culture bottles   Culture   Final    NO GROWTH 5 DAYS Performed at [facility], [address], [place], [place] [address]    Report Status 02/12/2023 FINAL  Final  MRSA Next Gen by PCR, Nasal     Status: Abnormal   Collection Time: 02/08/23  9:34 AM   Specimen: Nasal Mucosa; Nasal Swab  Result Value Ref Range Status   MRSA by PCR Next Gen DETECTED (A) NOT DETECTED Final    Comment: RESULT CALLED TO, READ BACK BY AND VERIFIED WITH: RN christine h. [phone] fcp (NOTE) The GeneXpert MRSA Assay (FDA approved for NASAL specimens only), is one component of a comprehensive MRSA colonization surveillance program. It is not intended to diagnose MRSA infection nor to guide or monitor treatment for MRSA infections. Test performance is not FDA approved in patients less than 18 years old. Performed at [facility], [address] [place], [place] [address]   Culture, blood (Routine X 2) w Reflex to ID Panel     Status: None   Collection Time: 02/08/23  9:46 AM   Specimen: BLOOD LEFT HAND  Result Value Ref Range Status   Specimen Description BLOOD LEFT HAND  Final   Special Requests   Final    BOTTLES DRAWN AEROBIC AND ANAEROBIC Blood Culture results may not be optimal due to an inadequate volume of blood received in culture bottles   Culture   Final    NO GROWTH 5 DAYS Performed at [facility], [address], [place], [place] [address]  Report Status 02/13/2023 FINAL  Final  Culture, blood (Routine X 2) w Reflex to ID Panel     Status: None   Collection Time: 02/08/23  9:54 AM   Specimen:  BLOOD LEFT HAND  Result Value Ref Range Status   Specimen Description BLOOD LEFT HAND  Final   Special Requests   Final    BOTTLES DRAWN AEROBIC AND ANAEROBIC Blood Culture results may not be optimal due to an inadequate volume of blood received in culture bottles   Culture   Final    NO GROWTH 5 DAYS Performed at [facility], [address], [place], [place] [address]    Report Status 02/13/2023 FINAL  Final  Body fluid culture w Gram Stain     Status: None   Collection Time: 02/08/23 11:04 AM   Specimen: Pleural Fluid  Result Value Ref Range Status   Specimen Description PLEURAL  Final   Special Requests NONE  Final   Gram Stain   Final    ABUNDANT WBC PRESENT, PREDOMINANTLY PMN ABUNDANT GRAM NEGATIVE RODS ABUNDANT GRAM POSITIVE COCCI IN PAIRS IN CHAINS Gram Stain Report Called to,Read Back By and Verified With: C. HICKLING [id number] @ 1519 FH    Culture   Final    MODERATE CAMPYLOBACTER SPECIES MODERATE PEPTOSTREPTOCOCCUS MICROS WITHIN MIXED ANAEROBIC ORGANISMS Performed at [facility], [address], [place], [place] [address]    Report Status 02/12/2023 FINAL  Final  Fungus Culture With Stain     Status: None (Preliminary result)   Collection Time: 02/08/23 11:04 AM   Specimen: Pleural Fluid  Result Value Ref Range Status   Fungus Stain Final report  Final    Comment: (NOTE) Performed At: [facility] [address] [place], [place] [id number] Jolene Schimke MD ZJ:[id number]    Fungus (Mycology) Culture PENDING  Incomplete   Fungal Source PLEURAL  Final    Comment: Performed at [facility], [address], [place], [place] [address]  Fungus Culture Result     Status: None   Collection Time: 02/08/23 11:04 AM  Result Value Ref Range Status   Result 1 Comment  Final    Comment: (NOTE) KOH/Calcofluor preparation:  no fungus observed. Performed At: [facility] [address] [place], [place] [id number] Jolene Schimke MD  [id number]   Aerobic/Anaerobic Culture w Gram Stain (surgical/deep wound)     Status: None (Preliminary result)   Collection Time: 02/10/23  9:23 AM   Specimen: Abscess  Result Value Ref Range Status   Specimen Description ABSCESS  Final   Special Requests chest  Final   Gram Stain   Final    ABUNDANT WBC PRESENT, PREDOMINANTLY PMN ABUNDANT GRAM POSITIVE COCCI RARE GRAM POSITIVE RODS    Culture   Final    NO GROWTH 2 DAYS NO ANAEROBES ISOLATED; CULTURE IN PROGRESS FOR 5 DAYS Performed at [facility], [address], [place], [place] [address]    Report Status PENDING  Incomplete     Studies: DG Chest Port 1 View Result Date: 02/12/2023 CLINICAL DATA:  Empyema. EXAM: PORTABLE CHEST 1 VIEW COMPARISON:  02/11/2023 FINDINGS: Left pleural drains again noted with residual loculated antral basal left pneumothorax and consolidative airspace disease in the left lung, similar to prior. Patchy airspace disease at the right base is similar. Cardiopericardial silhouette is at upper limits of normal for size. No acute bony abnormality. Telemetry leads overlie the chest. IMPRESSION: 1. No significant interval change. 2.  Persistent loculated anterobasilar left pneumothorax with left pleural drains in place. 3. Persistent consolidative airspace disease in the left lung and patchy airspace disease at the right base. Electronically Signed   By: Kennith Center M.D.   On: 02/12/2023 10:18      Joycelyn Das, MD  [facility] 02/13/2023  If 7PM-7AM, please contact night-coverage

## 2023-02-14 ENCOUNTER — Inpatient Hospital Stay (HOSPITAL_COMMUNITY): Payer: Medicaid Other

## 2023-02-14 ENCOUNTER — Other Ambulatory Visit: Payer: Self-pay

## 2023-02-14 ENCOUNTER — Other Ambulatory Visit (HOSPITAL_COMMUNITY): Payer: Self-pay

## 2023-02-14 DIAGNOSIS — A403 Sepsis due to Streptococcus pneumoniae: Secondary | ICD-10-CM

## 2023-02-14 LAB — BASIC METABOLIC PANEL
Anion gap: 9 (ref 5–15)
BUN: 5 mg/dL — ABNORMAL LOW (ref 8–23)
CO2: 20 mmol/L — ABNORMAL LOW (ref 22–32)
Calcium: 7.7 mg/dL — ABNORMAL LOW (ref 8.9–10.3)
Chloride: 103 mmol/L (ref 98–111)
Creatinine, Ser: 0.68 mg/dL (ref 0.61–1.24)
GFR, Estimated: >60 mL/min (ref >60)
Glucose, Bld: 109 mg/dL — ABNORMAL HIGH (ref 70–99)
Potassium: 3.9 mmol/L (ref 3.5–5.1)
Sodium: 132 mmol/L — ABNORMAL LOW (ref 135–145)

## 2023-02-14 LAB — CBC
HCT: 32.8 % — ABNORMAL LOW (ref 39.0–52.0)
Hemoglobin: 10.6 g/dL — ABNORMAL LOW (ref 13.0–17.0)
MCH: 29.9 pg (ref 26.0–34.0)
MCHC: 32.3 g/dL (ref 30.0–36.0)
MCV: 92.7 fL (ref 80.0–100.0)
Platelets: 236 10*3/uL (ref 150–400)
RBC: 3.54 MIL/uL — ABNORMAL LOW (ref 4.22–5.81)
RDW: 16 % — ABNORMAL HIGH (ref 11.5–15.5)
WBC: 11.5 10*3/uL — ABNORMAL HIGH (ref 4.0–10.5)
nRBC: 0 % (ref 0.0–0.2)

## 2023-02-14 LAB — PHOSPHORUS: Phosphorus: 2.7 mg/dL (ref 2.5–4.6)

## 2023-02-14 LAB — HEPARIN LEVEL (UNFRACTIONATED): Heparin Unfractionated: 0.72 [IU]/mL — ABNORMAL HIGH (ref 0.30–0.70)

## 2023-02-14 LAB — MAGNESIUM: Magnesium: 1.8 mg/dL (ref 1.7–2.4)

## 2023-02-14 MED ORDER — AMIODARONE HCL IN DEXTROSE 360-4.14 MG/200ML-% IV SOLN
30.0000 mg/h | INTRAVENOUS | Status: DC
  Administered 2023-02-14 – 2023-02-15 (×4): 30 mg/h via INTRAVENOUS
  Filled 2023-02-14 (×3): qty 200

## 2023-02-14 MED ORDER — AMIODARONE HCL IN DEXTROSE 360-4.14 MG/200ML-% IV SOLN
60.0000 mg/h | INTRAVENOUS | Status: AC
  Administered 2023-02-14 (×2): 60 mg/h via INTRAVENOUS
  Filled 2023-02-14 (×2): qty 200

## 2023-02-14 MED ORDER — MAGNESIUM SULFATE 2 GM/50ML IV SOLN
2.0000 g | Freq: Once | INTRAVENOUS | Status: AC
  Administered 2023-02-14: 2 g via INTRAVENOUS
  Filled 2023-02-14: qty 50

## 2023-02-14 MED ORDER — METRONIDAZOLE 500 MG PO TABS
500.0000 mg | ORAL_TABLET | Freq: Two times a day (BID) | ORAL | Status: DC
  Administered 2023-02-14 – 2023-02-17 (×6): 500 mg via ORAL
  Filled 2023-02-14 (×6): qty 1

## 2023-02-14 MED ORDER — SODIUM CHLORIDE (PF) 0.9 % IJ SOLN
10.0000 mg | Freq: Once | INTRAMUSCULAR | Status: AC
  Administered 2023-02-14: 10 mg via INTRAPLEURAL
  Filled 2023-02-14: qty 10

## 2023-02-14 MED ORDER — SODIUM CHLORIDE 0.9 % IV SOLN
2.0000 g | INTRAVENOUS | Status: DC
  Administered 2023-02-14 – 2023-02-16 (×3): 2 g via INTRAVENOUS
  Filled 2023-02-14 (×3): qty 20

## 2023-02-14 MED ORDER — STERILE WATER FOR INJECTION IJ SOLN
5.0000 mg | Freq: Once | RESPIRATORY_TRACT | Status: AC
  Administered 2023-02-14: 5 mg via INTRAPLEURAL
  Filled 2023-02-14: qty 5

## 2023-02-14 MED ORDER — AZITHROMYCIN 500 MG PO TABS
500.0000 mg | ORAL_TABLET | Freq: Every day | ORAL | Status: DC
  Administered 2023-02-14 – 2023-03-03 (×18): 500 mg via ORAL
  Filled 2023-02-14 (×18): qty 1

## 2023-02-14 MED ORDER — POTASSIUM CHLORIDE CRYS ER 20 MEQ PO TBCR
40.0000 meq | EXTENDED_RELEASE_TABLET | Freq: Once | ORAL | Status: AC
  Administered 2023-02-14: 40 meq via ORAL
  Filled 2023-02-14: qty 2

## 2023-02-14 MED ORDER — SODIUM CHLORIDE 0.9% FLUSH
10.0000 mL | Freq: Two times a day (BID) | INTRAVENOUS | Status: DC
  Administered 2023-02-14: 10 mL
  Administered 2023-02-14: 20 mL
  Administered 2023-02-15 – 2023-02-17 (×5): 10 mL
  Administered 2023-02-18: 20 mL
  Administered 2023-02-18 – 2023-02-19 (×2): 10 mL
  Administered 2023-02-19: 20 mL
  Administered 2023-02-20 – 2023-03-01 (×16): 10 mL
  Administered 2023-03-01: 20 mL
  Administered 2023-03-02: 10 mL
  Administered 2023-03-02: 20 mL
  Administered 2023-03-03 – 2023-03-06 (×7): 10 mL
  Administered 2023-03-06: 30 mL
  Administered 2023-03-07: 40 mL

## 2023-02-14 MED ORDER — SODIUM CHLORIDE 0.9% FLUSH
10.0000 mL | INTRAVENOUS | Status: DC | PRN
  Administered 2023-02-26: 10 mL

## 2023-02-14 MED ORDER — AMIODARONE LOAD VIA INFUSION
150.0000 mg | Freq: Once | INTRAVENOUS | Status: AC
  Administered 2023-02-14: 150 mg via INTRAVENOUS
  Filled 2023-02-14: qty 83.34

## 2023-02-14 MED ORDER — METOPROLOL TARTRATE 12.5 MG HALF TABLET
12.5000 mg | ORAL_TABLET | Freq: Two times a day (BID) | ORAL | Status: DC
  Administered 2023-02-14 – 2023-03-07 (×34): 12.5 mg via ORAL
  Filled 2023-02-14 (×38): qty 1

## 2023-02-14 NOTE — Plan of Care (Signed)

## 2023-02-14 NOTE — Plan of Care (Signed)
  Problem: Fluid Volume: Goal: Hemodynamic stability will improve Outcome: Progressing   Problem: Clinical Measurements: Goal: Diagnostic test results will improve Outcome: Progressing Goal: Signs and symptoms of infection will decrease Outcome: Progressing   Problem: Respiratory: Goal: Ability to maintain adequate ventilation will improve Outcome: Progressing   Problem: Health Behavior/Discharge Planning: Goal: Ability to manage health-related needs will improve Outcome: Progressing   Problem: Clinical Measurements: Goal: Ability to maintain clinical measurements within normal limits will improve Outcome: Progressing Goal: Will remain free from infection Outcome: Progressing Goal: Diagnostic test results will improve Outcome: Progressing Goal: Respiratory complications will improve Outcome: Progressing Goal: Cardiovascular complication will be avoided Outcome: Progressing   Problem: Activity: Goal: Risk for activity intolerance will decrease Outcome: Not Progressing   Problem: Nutrition: Goal: Adequate nutrition will be maintained Outcome: Not Progressing   Problem: Coping: Goal: Level of anxiety will decrease Outcome: Progressing   Problem: Elimination: Goal: Will not experience complications related to bowel motility Outcome: Not Progressing Goal: Will not experience complications related to urinary retention Outcome: Progressing   Problem: Pain Managment: Goal: General experience of comfort will improve and/or be controlled Outcome: Progressing   Problem: Safety: Goal: Ability to remain free from injury will improve Outcome: Progressing   Problem: Skin Integrity: Goal: Risk for impaired skin integrity will decrease Outcome: Progressing

## 2023-02-14 NOTE — Progress Notes (Signed)
PROGRESS NOTE  Feliz Lincoln ZOX:[id number] DOB: 06-16-1960 DOA: 02/08/2023 PCP: Patient, No Pcp Per   LOS: 6 days   Brief narrative:  Derrious Bologna is a 63 y.o. male with past medical history of COPD and a smoker who had initially presented to [facility] hospital on 02/07/2023 with shortness of breath dyspnea on exertion with productive cough.  He was noted to be tachypneic with significant leukocytosis and elevated lactate.  Patient was started on IV antibiotic.  CT scan of the chest however was concerning for empyema versus abscess so he was transferred to [facility] on 02/08/2023 for possible VATS.  Patient however went into A-fib with RVR and hypotension and was admitted to ICU service.  PCCM placed a left-sided chest tube on 02/08/2023 and IR was subsequently consulted to put the second chest tube in the anterior pleural space for additional fluid drainage.  Patient was subsequently transferred out of the ICU to medical service.    Assessment/Plan: Principal Problem:   Sepsis due to pneumonia [facility]) Active Problems:   Empyema (HCC)   Sepsis associated hypotension (HCC)   Need for management of chest tube   Hydropneumothorax   Pneumonia of left lower lobe due to infectious organism   Pressure injury of skin   Atrial fibrillation with rapid ventricular response (HCC)   Acute systolic heart failure (HCC)  Sepsis secondary to pneumonia with left-sided empyema leading to acute hypoxic respiratory failure.  Status post chest tube placement x2.  Second chest tube placement by IR 02/10/2023. On vancomycin, cefepime and Flagyl.  Pleural fluid growing gram-positive rods and gram-positive cocci but no growth noted.  Blood cultures negative so far in 5 days..  WBC has trended down to 11.3 from 14.0 from initial 35.7.   CT surgery, pulmonary following.   Continue pain management.  Postthrombolytic placement on 02/08/2023.    Follow pulmonary recommendations on further chest tube management..    New  onset atrial fibrillation with RVR.  Was on amiodarone drip, cardiology following and hads been changed to amiodarone p.o but had recurrent of RVR so has been transitioned to drip again.  Continue electrolyte monitoring and supplementation as needed.  Rate controlled at this time.  On heparin drip at this time.  Magnesium of 1.8.  Acute systolic congestive heart failure with moderately reduced ejection fraction.  2D echocardiogram 45 to 50% with global hypokinesis.  Likely tachycardia mediated heart failure in the setting of A-fib.  Cardiology following.   On heparin drip.  Latest magnesium 1.9.  History of COPD smoker.  Continue nebulizers.  Denies dyspnea shortness of breath  Discomfort insomnia anxiety.  On muscle relaxant Atarax Ambien and pain medication.  DVT prophylaxis: SCDs Start: 02/08/23 0845 SCDs Start: 02/08/23 0305   Disposition: Home when okay with pulmonary and cardiology  Status is: Inpatient Remains inpatient appropriate because: IV antibiotics chest tubes x 2, empyema, pending clinical improvement, IV antibiotics, A-fib with RVR.    Code Status:     Code Status: Full Code  Family Communication: None at bedside  Consultants: CT surgery PCCM Interventional radiology Cardiology  Procedures: Left-sided chest tube placement x 2.  Anti-infectives:  Vanco, cefepime and metronidazole IV.  Anti-infectives (From admission, onward)    Start     Dose/Rate Route Frequency Ordered Stop   02/12/23 0200  vancomycin (VANCOREADY) IVPB 1500 mg/300 mL        1,500 mg 150 mL/hr over 120 Minutes Intravenous Every 12 hours 02/11/23 1301     02/11/23 1430  vancomycin (VANCOCIN) 750 mg in sodium chloride 0.9 % 250 mL IVPB  Status:  Discontinued        750 mg 265 mL/hr over 60 Minutes Intravenous  Once 02/11/23 1309 02/11/23 1354   02/11/23 1430  vancomycin (VANCOCIN) 750 mg in sodium chloride 0.9 % 250 mL IVPB        750 mg 250 mL/hr over 60 Minutes Intravenous  Once 02/11/23  1354 02/11/23 1543   02/11/23 1400  vancomycin (VANCOREADY) IVPB 750 mg/150 mL  Status:  Discontinued        750 mg 150 mL/hr over 60 Minutes Intravenous  Once 02/11/23 1301 02/11/23 1309   02/10/23 2130  metroNIDAZOLE (FLAGYL) IVPB 500 mg        500 mg 100 mL/hr over 60 Minutes Intravenous Every 12 hours 02/10/23 1434     02/08/23 1730  metroNIDAZOLE (FLAGYL) IVPB 500 mg  Status:  Discontinued        500 mg 100 mL/hr over 60 Minutes Intravenous Every 8 hours 02/08/23 1637 02/10/23 1434   02/08/23 0400  vancomycin (VANCOREADY) IVPB 750 mg/150 mL  Status:  Discontinued        750 mg 150 mL/hr over 60 Minutes Intravenous Every 12 hours 02/08/23 0324 02/08/23 0328   02/08/23 0400  ceFEPIme (MAXIPIME) 2 g in sodium chloride 0.9 % 100 mL IVPB        2 g 200 mL/hr over 30 Minutes Intravenous Every 8 hours 02/08/23 0324     02/08/23 0400  vancomycin (VANCOCIN) 750 mg in sodium chloride 0.9 % 250 mL IVPB  Status:  Discontinued        750 mg 250 mL/hr over 60 Minutes Intravenous Every 12 hours 02/08/23 0328 02/11/23 1258       Subjective: Today, patient was seen and examined at bedside.  Patient states that he is little more comfortable but had the A-fib with RVR and was started back on amiodarone drip.  Status post PICC line placement.  Denies of pain, nausea, vomiting, fever, chills or rigor.  Objective: Vitals:   02/14/23 0700 02/14/23 0759  BP: 105/71   Pulse: (!) 140 (!) 145  Resp:    Temp: 98.4 F (36.9 C)   SpO2:      Intake/Output Summary (Last 24 hours) at 02/14/2023 1128 Last data filed at 02/14/2023 0843 Gross per 24 hour  Intake 2892.53 ml  Output 4270 ml  Net -1377.47 ml   Filed Weights   02/13/23 0429 02/13/23 2000 02/14/23 0500  Weight: 83.3 kg 83.3 kg 78.5 kg   Body mass index is 23.47 kg/m.   Physical Exam:  GENERAL: Patient is alert awake and Communicative, not in obvious distress.  HENT: No scleral pallor or icterus. Pupils equally reactive to light. Oral  mucosa is moist NECK: is supple, no gross swelling noted. CHEST: Left chest wall with chest tubes x 2.  Decreased breath sounds on the left side. CVS: S1 and S2 heard, no murmur. Regular rate and rhythm.  ABDOMEN: Soft, non-tender, bowel sounds are present. EXTREMITIES: No edema.  Left upper extremity PICC line in place. CNS: Cranial nerves are intact. No focal motor deficits. SKIN: warm and dry without rashes.  Data Review: I have personally reviewed the following laboratory data and studies,  CBC: Recent Labs  Lab 02/07/23 1528 02/08/23 0238 02/08/23 0946 02/09/23 0318 02/10/23 1458 02/11/23 0221 02/12/23 0214 02/14/23 0217  WBC 35.7* 26.5*   < > 18.5* 17.3* 14.0* 11.3* 11.5*  NEUTROABS 32.3*  23.8*  --   --   --   --   --   --   HGB 11.3* 10.3*   < > 10.2* 11.1* 10.5* 10.2* 10.6*  HCT 35.3* 31.6*   < > 32.0* 34.2* 32.3* 31.4* 32.8*  MCV 93.9 93.8   < > 93.6 93.4 92.3 92.4 92.7  PLT 396 300   < > 268 260 263 237 236   < > = values in this interval not displayed.   Basic Metabolic Panel: Recent Labs  Lab 02/09/23 0318 02/10/23 1143 02/11/23 0221 02/12/23 0214 02/13/23 0204 02/14/23 0217  NA 138 136 139 137 134* 132*  K 2.9* 4.6 3.0* 4.0 3.7 3.9  CL 108 106 105 109 104 103  CO2 25 21* 23 24 24  20*  GLUCOSE 135* 139* 123* 133* 158* 109*  BUN 18 15 13 11 9  5*  CREATININE 0.58* 0.62 0.71 0.54* 0.65 0.68  CALCIUM 7.9* 7.8* 8.1* 7.5* 7.5* 7.7*  MG 1.9  --  1.8 2.2 1.9 1.8  PHOS 2.5  --   --   --   --  2.7   Liver Function Tests: Recent Labs  Lab 02/07/23 1528 02/09/23 0318  AST 33 44*  ALT 22 26  ALKPHOS 157* 117  BILITOT 2.1* 1.2  PROT 8.0 5.6*  ALBUMIN 1.8* <1.5*   Recent Labs  Lab 02/07/23 1528  LIPASE 17   No results for input(s): "AMMONIA" in the last 168 hours. Cardiac Enzymes: No results for input(s): "CKTOTAL", "CKMB", "CKMBINDEX", "TROPONINI" in the last 168 hours. BNP (last 3 results) No results for input(s): "BNP" in the last 8760  hours.  ProBNP (last 3 results) No results for input(s): "PROBNP" in the last 8760 hours.  CBG: No results for input(s): "GLUCAP" in the last 168 hours. Recent Results (from the past 240 hours)  Culture, blood (single)     Status: None   Collection Time: 02/07/23  5:31 PM   Specimen: BLOOD  Result Value Ref Range Status   Specimen Description BLOOD BLOOD LEFT ARM  Final   Special Requests   Final    BOTTLES DRAWN AEROBIC AND ANAEROBIC Blood Culture results may not be optimal due to an inadequate volume of blood received in culture bottles   Culture   Final    NO GROWTH 5 DAYS Performed at [facility], [address], [place], [place] [address]    Report Status 02/12/2023 FINAL  Final  MRSA Next Gen by PCR, Nasal     Status: Abnormal   Collection Time: 02/08/23  9:34 AM   Specimen: Nasal Mucosa; Nasal Swab  Result Value Ref Range Status   MRSA by PCR Next Gen DETECTED (A) NOT DETECTED Final    Comment: RESULT CALLED TO, READ BACK BY AND VERIFIED WITH: RN christine h. [phone] fcp (NOTE) The GeneXpert MRSA Assay (FDA approved for NASAL specimens only), is one component of a comprehensive MRSA colonization surveillance program. It is not intended to diagnose MRSA infection nor to guide or monitor treatment for MRSA infections. Test performance is not FDA approved in patients less than 33 years old. Performed at [facility], [address], [place], [place] [address]   Culture, blood (Routine X 2) w Reflex to ID Panel     Status: None   Collection Time: 02/08/23  9:46 AM   Specimen: BLOOD LEFT HAND  Result Value Ref Range Status   Specimen Description BLOOD LEFT HAND  Final  Special Requests   Final    BOTTLES DRAWN AEROBIC AND ANAEROBIC Blood Culture results may not be optimal due to an inadequate volume of blood received in culture bottles   Culture   Final    NO GROWTH 5 DAYS Performed at [facility], [address] [place], [place]  [address]    Report Status 02/13/2023 FINAL  Final  Culture, blood (Routine X 2) w Reflex to ID Panel     Status: None   Collection Time: 02/08/23  9:54 AM   Specimen: BLOOD LEFT HAND  Result Value Ref Range Status   Specimen Description BLOOD LEFT HAND  Final   Special Requests   Final    BOTTLES DRAWN AEROBIC AND ANAEROBIC Blood Culture results may not be optimal due to an inadequate volume of blood received in culture bottles   Culture   Final    NO GROWTH 5 DAYS Performed at [facility], [address] [place], [place] [address]    Report Status 02/13/2023 FINAL  Final  Body fluid culture w Gram Stain     Status: None   Collection Time: 02/08/23 11:04 AM   Specimen: Pleural Fluid  Result Value Ref Range Status   Specimen Description PLEURAL  Final   Special Requests NONE  Final   Gram Stain   Final    ABUNDANT WBC PRESENT, PREDOMINANTLY PMN ABUNDANT GRAM NEGATIVE RODS ABUNDANT GRAM POSITIVE COCCI IN PAIRS IN CHAINS Gram Stain Report Called to,Read Back By and Verified With: C. HICKLING [id number] @ 1519 FH    Culture   Final    MODERATE CAMPYLOBACTER SPECIES MODERATE PEPTOSTREPTOCOCCUS MICROS WITHIN MIXED ANAEROBIC ORGANISMS Performed at [facility], [address], [place], [place] [address]    Report Status 02/12/2023 FINAL  Final  Fungus Culture With Stain     Status: None (Preliminary result)   Collection Time: 02/08/23 11:04 AM   Specimen: Pleural Fluid  Result Value Ref Range Status   Fungus Stain Final report  Final    Comment: (NOTE) Performed At: [facility] [address] [place], [place] [id number] Jolene Schimke MD VH:[id number]    Fungus (Mycology) Culture PENDING  Incomplete   Fungal Source PLEURAL  Final    Comment: Performed at [facility], [address], [place], [place] [address]  Fungus Culture Result     Status: None   Collection Time: 02/08/23 11:04 AM  Result Value Ref Range Status   Result 1 Comment  Final     Comment: (NOTE) KOH/Calcofluor preparation:  no fungus observed. Performed At: [facility] [address] [place], [place] [id number] Jolene Schimke MD GU:[id number]   Aerobic/Anaerobic Culture w Gram Stain (surgical/deep wound)     Status: Abnormal (Preliminary result)   Collection Time: 02/10/23  9:23 AM   Specimen: Abscess  Result Value Ref Range Status   Specimen Description ABSCESS  Final   Special Requests chest  Final   Gram Stain   Final    ABUNDANT WBC PRESENT, PREDOMINANTLY PMN ABUNDANT GRAM POSITIVE COCCI RARE GRAM POSITIVE RODS Performed at [facility], [address], [place], [place] [address]    Culture (A)  Final    STREPTOCOCCUS CONSTELLATUS CULTURE REINCUBATED FOR BETTER GROWTH NO ANAEROBES ISOLATED; CULTURE IN PROGRESS FOR 5 DAYS    Report Status PENDING  Incomplete     Studies: [place] EKG SITE RITE Result Date: 02/14/2023 If Site Rite image not attached, placement could not  be confirmed due to current cardiac rhythm.  DG CHEST PORT 1 VIEW Result Date: 02/13/2023 CLINICAL DATA:  Chest tube in place. EXAM: PORTABLE CHEST 1 VIEW COMPARISON:  Radiograph yesterday.  Chest CT 02/09/2023 FINDINGS: Two left-sided pigtail catheters are stable in positioning. Unchanged appearance of left basilar hydropneumothorax. Unchanged airspace disease throughout the left mid and lower lung zones. Small right pleural effusion which was better demonstrated on CT. Stable heart size and mediastinal contours. IMPRESSION: 1. Unchanged positioning of 2 left basilar pigtail catheters. Unchanged left basilar hydropneumothorax. 2. Unchanged left lung airspace disease. Electronically Signed   By: Narda Rutherford M.D.   On: 02/13/2023 17:46      Joycelyn Das, MD  [facility] 02/14/2023  If 7PM-7AM, please contact night-coverage

## 2023-02-14 NOTE — Progress Notes (Signed)
Heparin placed on hold for possible TPA/DNase intra pleural pending CT chest results  Simonne Martinet ACNP-BC [facility] Pulmonary/Critical Care Pager # [phone] OR # [phone] if no answer

## 2023-02-14 NOTE — Progress Notes (Signed)
PHARMACY - ANTICOAGULATION CONSULT NOTE  Pharmacy Consult for IV heparin Indication: atrial fibrillation  No Known Allergies  Patient Measurements: Height: 6' (182.9 cm) Weight: 78.5 kg (173 lb 1 oz) IBW/kg (Calculated) : 77.6 Heparin Dosing Weight: ~ 80 kg  Vital Signs: Temp: 98.4 F (36.9 C) (01/27 0700) Temp Source: Oral (01/27 0700) BP: 105/71 (01/27 0700) Pulse Rate: 145 (01/27 0759)  Labs: Recent Labs    02/12/23 0214 02/12/23 1042 02/13/23 0204 02/13/23 1048 02/13/23 1950 02/14/23 0217  HGB 10.2*  --   --   --   --  10.6*  HCT 31.4*  --   --   --   --  32.8*  PLT 237  --   --   --   --  236  HEPARINUNFRC <0.10*   < >  --  0.12* 0.63 0.72*  CREATININE 0.54*  --  0.65  --   --  0.68   < > = values in this interval not displayed.    Estimated Creatinine Clearance: 105.1 mL/min (by C-G formula based on SCr of 0.68 mg/dL).   Medical History: History reviewed. No pertinent past medical history.  Medications:  Infusions:   amiodarone     Followed by   amiodarone     ceFEPime (MAXIPIME) IV Stopped (02/14/23 0459)   heparin 2,600 Units/hr (02/14/23 1610)   metronidazole 500 mg (02/14/23 0817)   vancomycin Stopped (02/14/23 9604)    Assessment: 63 yo male with new onset afib, initially converted to NSR following placement of chest tube however patient had converted back to afib. Pharmacy asked to start anticoagulation with IV heparin.  No known anticoagulants PTA or hx bleeding.  Heparin level 0.72 and slightly supratherapeutic on 2600 units/hr, CBC stable  Goal of Therapy:  Heparin level 0.3-0.7 units/ml Monitor platelets by anticoagulation protocol: Yes   Plan:  Decrease heparin gtt to 2550 units/hr Daily heparin level, CBC, s/s bleeding  Harland German, PharmD Clinical Pharmacist **Pharmacist phone directory can now be found on [email or web address] ([email or web address]).  Listed under [facility].

## 2023-02-14 NOTE — Progress Notes (Signed)
The right arm is infiltrated up to the armpit and not suitable for the PICC placement at this time. PICC placed in the left upper arm. RN made aware to d/c left forearm piv and start heparin drip to the left hand piv.

## 2023-02-14 NOTE — Progress Notes (Signed)
[facility] night cross cover note:   I was notified by RN that this patient, who has had some runs of atrial fibrillation with RVR during this specialization, has gone back into atrial fibrillation with RVR, with initially sustained heart rates in the 150s.  He received a dose of IV Lopressor per existing order, with ensuing improvement in heart rate into the 110s to 120s bpm, while maintaining stable blood pressures, with most recent blood pressure noted to be 118/75.  Vital signs appear stable at this time, including afebrile, with respiratory rate 16 and oxygen saturation in the low to mid 90s on room air.  I briefly reviewed cardiology's most recent progress note.  The patient is currently on an oral amiodarone taper, and cards recommend keeping potassium level greater than 4 and mag greater than 2. This AM: k 3.9 and mag 1.8. I ordered supplementation of both potassium and magnesium. as he responded well to the iv lopressor without decrease in BP, I will order a low dose po BB for him as well. I've ordered metoprolol to tartrate 12.5 mg p.o. twice daily.   Newton Pigg, DO Hospitalist

## 2023-02-14 NOTE — Consult Note (Signed)
[facility]                                                                                        Patient Identification: Patient Name: Mathew Wyatt MRN: [id number] Admit Date: 02/08/2023  2:04 AM Today's Date: 02/14/2023 Reason for consult: Empyema  Requesting provider: Dr Tyson Babinski  Principal Problem:   Sepsis due to pneumonia [facility]) Active Problems:   Empyema (HCC)   Sepsis associated hypotension (HCC)   Need for management of chest tube   Hydropneumothorax   Pneumonia of left lower lobe due to infectious organism   Pressure injury of skin   Atrial fibrillation with rapid ventricular response (HCC)   Acute systolic heart failure (HCC)   Antibiotics:  Vancomycin 1/20- Cefepime 1/20- Metronidazole 1/20-  Lines/Hardware: Left arm PICC, Left side CT  Assessment # Left lung empyema 1/21 status post pleural catheter insertion, pus was obtained.  Culture with Campylobacter species, Peptostreptococcus micros and mixed anaerobic organisms 1/21 fibrinolysis 1/23 CT-guided anterior left thoracostomy tube placement.  Culture with Streptococcus constellatus  02/08/23 HIV NR  Recommendations  -Will DC vancomycin, switch cefepime to ceftriaxone and continue metronidazole -Start azithromycin for campylobacter. Uncommon organism to cause empyema. No GI symptoms.  -Orthopantogram ordered. -Monitor CBC, CMP -Catheter management per IR and Pulm  Rest of the management as per the primary team. Please call with questions or concerns.  Thank you for the consult ______________________________________________________________________________________________________ HPI and Hospital Course: 63 year old male with PMH of COPD, smoking who presented to the ED on 1/21 for progressive shortness of breath and cough ongoing for 2 weeks.  Initially seen at [facility] 1/20  where workup was significant for large  left-sided hydropneumothorax with elevated WBC and lactic acid.  Started on broad-spectrum antibiotics and transferred to [facility] for CT surgery.  Hospital course complicated by A-fib with RVR and hypotension and hence initially admitted to ICU. Denies fevers, chills and night sweats. Reports h/o dental/gum pain ? a month ago but did not see dentist.  Reports poor dental hygiene.  Smokes 1 pack of cigarettes daily denies alcohol and IVDU.  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  Denies any recent travel or sick contacts or pets at home.  Denies any GI symptoms including nausea vomiting or diarrhea.    ROS: General- Denies fever, chills, loss of appetite and loss of weight HEENT - Denies headache, blurry vision, neck pain, sinus pain Chest - Denies any chest pain CVS- Denies any dizziness/lightheadedness, syncopal attacks, palpitations Abdomen- Denies any nausea, vomiting, abdominal pain, hematochezia and diarrhea Neuro - Denies any weakness, numbness, tingling sensation Psych - Denies any changes in mood irritability or depressive symptoms GU- Denies any burning, dysuria, hematuria or increased frequency of urination Skin - denies any rashes/lesions MSK - denies any joint pain/swelling or restricted ROM   History reviewed. No pertinent past medical history.  Past Surgical History:  Procedure Laterality Date   HERNIA REPAIR     MANDIBLE SURGERY      Scheduled Meds:  Chlorhexidine Gluconate Cloth  6 each Topical [phone]  Gerhardt's butt cream   Topical TID   methocarbamol (ROBAXIN) injection  500 mg Intravenous Q8H   Or   methocarbamol  500 mg Oral Q8H   metoprolol tartrate  12.5 mg Oral BID   mupirocin ointment   Nasal BID   nicotine  21 mg Transdermal Daily   oxyCODONE  10 mg Oral Q6H   pantoprazole  40 mg Oral Daily   polyethylene glycol  17 g Oral Daily   sodium chloride flush  10 mL Intrapleural Q8H   sodium chloride flush   10-40 mL Intracatheter Q12H   spironolactone  12.5 mg Oral Daily   Continuous Infusions:  amiodarone 60 mg/hr (02/14/23 1051)   Followed by   amiodarone     ceFEPime (MAXIPIME) IV 2 g (02/14/23 1149)   heparin 2,550 Units/hr (02/14/23 1055)   metronidazole 500 mg (02/14/23 0817)   vancomycin Stopped (02/14/23 0332)   PRN Meds:.acetaminophen **OR** acetaminophen, docusate sodium, HYDROmorphone (DILAUDID) injection, hydrOXYzine, LORazepam, metoprolol tartrate, ondansetron **OR** ondansetron (ZOFRAN) IV, mouth rinse, sodium chloride flush, zolpidem  No Known Allergies  Social History   Socioeconomic History   Marital status: Single    Spouse name: Not on file   Number of children: Not on file   Years of education: Not on file   Highest education level: Not on file  Occupational History   Not on file  Tobacco Use   Smoking status: Every Day    Current packs/day: 1.00    Types: Cigarettes   Smokeless tobacco: Never  Substance and Sexual Activity   Alcohol use: No   Drug use: No   Sexual activity: Not on file  Other Topics Concern   Not on file  Social History Narrative   Not on file   Social Drivers of Health   Financial Resource Strain: Not on file  Food Insecurity: No Food Insecurity (02/12/2023)   Hunger Vital Sign    Worried About Running Out of Food in the Last Year: Never true    Ran Out of Food in the Last Year: Never true  Transportation Needs: No Transportation Needs (02/12/2023)   PRAPARE - Administrator, Civil Service (Medical): No    Lack of Transportation (Non-Medical): No  Physical Activity: Not on file  Stress: Not on file  Social Connections: Not on file  Intimate Partner Violence: Not At Risk (02/12/2023)   Humiliation, Afraid, Rape, and Kick questionnaire    Fear of Current or Ex-Partner: No    Emotionally Abused: No    Physically Abused: No    Sexually Abused: No   History reviewed. No pertinent family history.   Vitals BP 102/70  (BP Location: Right Arm)   Pulse 92   Temp 97.7 F (36.5 C) (Oral)   Resp 16   Ht 6' (1.829 m)   Wt 78.5 kg   SpO2 94%   BMI 23.47 kg/m   Physical Exam Constitutional: Adult male lying in the bed, not in acute distress    Comments: HEENT wnl, poor oral dental hygiene  Cardiovascular:     Rate and Rhythm: Normal rate and regular rhythm.     Heart sounds: s1s2  Pulmonary:     Effort: Pulmonary effort is normal.     Comments: Decreased air entry in the left lung.  Left-sided chest has 2 pleural drainage catheter  Abdominal:     Palpations: Abdomen is soft.     Tenderness: Nontender and nondistended  Musculoskeletal:  General: No swelling or tenderness in peripheral joints  Skin:    Comments: No rashes  Neurological:     General: Awake, alert and oriented, grossly nonfocal follows commands appropriately  Psychiatric:        Mood and Affect: Mood normal.    Pertinent Microbiology Results for orders placed or performed during the hospital encounter of 02/08/23  MRSA Next Gen by PCR, Nasal     Status: Abnormal   Collection Time: 02/08/23  9:34 AM   Specimen: Nasal Mucosa; Nasal Swab  Result Value Ref Range Status   MRSA by PCR Next Gen DETECTED (A) NOT DETECTED Final    Comment: RESULT CALLED TO, READ BACK BY AND VERIFIED WITH: RN christine h. [id number] [id number] fcp (NOTE) The GeneXpert MRSA Assay (FDA approved for NASAL specimens only), is one component of a comprehensive MRSA colonization surveillance program. It is not intended to diagnose MRSA infection nor to guide or monitor treatment for MRSA infections. Test performance is not FDA approved in patients less than 18 years old. Performed at [facility], [address], [place], [place] [address]   Culture, blood (Routine X 2) w Reflex to ID Panel     Status: None   Collection Time: 02/08/23  9:46 AM   Specimen: BLOOD LEFT HAND  Result Value Ref Range Status   Specimen Description BLOOD LEFT HAND   Final   Special Requests   Final    BOTTLES DRAWN AEROBIC AND ANAEROBIC Blood Culture results may not be optimal due to an inadequate volume of blood received in culture bottles   Culture   Final    NO GROWTH 5 DAYS Performed at [facility], [address], [place], [place] [address]    Report Status 02/13/2023 FINAL  Final  Culture, blood (Routine X 2) w Reflex to ID Panel     Status: None   Collection Time: 02/08/23  9:54 AM   Specimen: BLOOD LEFT HAND  Result Value Ref Range Status   Specimen Description BLOOD LEFT HAND  Final   Special Requests   Final    BOTTLES DRAWN AEROBIC AND ANAEROBIC Blood Culture results may not be optimal due to an inadequate volume of blood received in culture bottles   Culture   Final    NO GROWTH 5 DAYS Performed at [facility], [address], [place], [place] [address]    Report Status 02/13/2023 FINAL  Final  Body fluid culture w Gram Stain     Status: None   Collection Time: 02/08/23 11:04 AM   Specimen: Pleural Fluid  Result Value Ref Range Status   Specimen Description PLEURAL  Final   Special Requests NONE  Final   Gram Stain   Final    ABUNDANT WBC PRESENT, PREDOMINANTLY PMN ABUNDANT GRAM NEGATIVE RODS ABUNDANT GRAM POSITIVE COCCI IN PAIRS IN CHAINS Gram Stain Report Called to,Read Back By and Verified With: C. HICKLING [id number] @ 1519 FH    Culture   Final    MODERATE CAMPYLOBACTER SPECIES MODERATE PEPTOSTREPTOCOCCUS MICROS WITHIN MIXED ANAEROBIC ORGANISMS Performed at [facility], [address] [place], [place] [address]    Report Status 02/12/2023 FINAL  Final  Fungus Culture With Stain     Status: None (Preliminary result)   Collection Time: 02/08/23 11:04 AM   Specimen: Pleural Fluid  Result Value Ref Range Status   Fungus Stain Final report  Final    Comment: (NOTE) Performed At: [facility]  [place] [address] [place], [place] [id number] Jolene Schimke MD UX:[id number]    Fungus (Mycology)  Culture PENDING  Incomplete   Fungal Source PLEURAL  Final    Comment: Performed at [facility], [address], [place], [place] [address]  Fungus Culture Result     Status: None   Collection Time: 02/08/23 11:04 AM  Result Value Ref Range Status   Result 1 Comment  Final    Comment: (NOTE) KOH/Calcofluor preparation:  no fungus observed. Performed At: [facility] [address] [place], [place] [id number] Jolene Schimke MD DG:[id number]   Aerobic/Anaerobic Culture w Gram Stain (surgical/deep wound)     Status: Abnormal (Preliminary result)   Collection Time: 02/10/23  9:23 AM   Specimen: Abscess  Result Value Ref Range Status   Specimen Description ABSCESS  Final   Special Requests chest  Final   Gram Stain   Final    ABUNDANT WBC PRESENT, PREDOMINANTLY PMN ABUNDANT GRAM POSITIVE COCCI RARE GRAM POSITIVE RODS Performed at [facility], [address] [place], [place] [address]    Culture (A)  Final    STREPTOCOCCUS CONSTELLATUS CULTURE REINCUBATED FOR BETTER GROWTH NO ANAEROBES ISOLATED; CULTURE IN PROGRESS FOR 5 DAYS    Report Status PENDING  Incomplete   Pertinent Lab seen by me:    Latest Ref Rng & Units 02/14/2023    2:17 AM 02/12/2023    2:14 AM 02/11/2023    2:21 AM  CBC  WBC 4.0 - 10.5 K/uL 11.5  11.3  14.0   Hemoglobin 13.0 - 17.0 g/dL 16.6  06.3  01.6   Hematocrit 39.0 - 52.0 % 32.8  31.4  32.3   Platelets 150 - 400 K/uL 236  237  263       Latest Ref Rng & Units 02/14/2023    2:17 AM 02/13/2023    2:04 AM 02/12/2023    2:14 AM  CMP  Glucose 70 - 99 mg/dL 010  932  355   BUN 8 - 23 mg/dL 5  9  11    Creatinine 0.61 - 1.24 mg/dL 7.32  2.02  5.42   Sodium 135 - 145 mmol/L 132  134  137   Potassium 3.5 - 5.1 mmol/L 3.9  3.7  4.0   Chloride 98 - 111 mmol/L 103  104  109   CO2 22 - 32 mmol/L 20  24  24    Calcium 8.9 - 10.3 mg/dL 7.7  7.5  7.5      Pertinent Imagings/Other Imagings Plain films and CT images have been personally  visualized and interpreted; radiology reports have been reviewed. Decision making incorporated into the Impression / Recommendations.  DG CHEST PORT 1 VIEW Result Date: 02/14/2023 CLINICAL DATA:  Chest tube placement EXAM: PORTABLE CHEST 1 VIEW COMPARISON:  X-ray 02/13/2023 and older FINDINGS: Stable placement of 2 left basilar chest tubes, pigtail catheters. Stable associated left mid lower lung opacity and small left basilar pneumothorax. No pneumothorax on the right. Persistent parenchymal opacities at the right lung base. Stable cardiopericardial silhouette with some volume loss on the left. No edema. Overlapping cardiac leads. IMPRESSION: No significant interval change. Electronically Signed   By: Karen Kays M.D.   On: 02/14/2023 12:13   [place] EKG SITE RITE Result Date: 02/14/2023 If Site Rite image not attached, placement could not be confirmed due to current cardiac rhythm.  DG CHEST PORT 1 VIEW Result Date: 02/13/2023 CLINICAL DATA:  Chest tube in place. EXAM: PORTABLE  CHEST 1 VIEW COMPARISON:  Radiograph yesterday.  Chest CT 02/09/2023 FINDINGS: Two left-sided pigtail catheters are stable in positioning. Unchanged appearance of left basilar hydropneumothorax. Unchanged airspace disease throughout the left mid and lower lung zones. Small right pleural effusion which was better demonstrated on CT. Stable heart size and mediastinal contours. IMPRESSION: 1. Unchanged positioning of 2 left basilar pigtail catheters. Unchanged left basilar hydropneumothorax. 2. Unchanged left lung airspace disease. Electronically Signed   By: Narda Rutherford M.D.   On: 02/13/2023 17:46   DG Chest Port 1 View Result Date: 02/12/2023 CLINICAL DATA:  Empyema. EXAM: PORTABLE CHEST 1 VIEW COMPARISON:  02/11/2023 FINDINGS: Left pleural drains again noted with residual loculated antral basal left pneumothorax and consolidative airspace disease in the left lung, similar to prior. Patchy airspace disease at the right base is  similar. Cardiopericardial silhouette is at upper limits of normal for size. No acute bony abnormality. Telemetry leads overlie the chest. IMPRESSION: 1. No significant interval change. 2. Persistent loculated anterobasilar left pneumothorax with left pleural drains in place. 3. Persistent consolidative airspace disease in the left lung and patchy airspace disease at the right base. Electronically Signed   By: Kennith Center M.D.   On: 02/12/2023 10:18   DG Chest 1 View Result Date: 02/11/2023 CLINICAL DATA:  Left-sided pleural effusion EXAM: CHEST  1 VIEW COMPARISON:  X-ray and CT 02/09/2023 and older. FINDINGS: Placement of a second pigtail catheter adjacent to the first at the left lung base. Small left basilar pneumothorax is slightly increased. Persistent opacity at the left mid lower lung. Mild linear opacity at the right lung base is slightly increased from previous. Stable cardiopericardial silhouette. No pneumothorax. Overlapping cardiac leads. IMPRESSION: Placement of a second pigtail catheter at the left lung base. Slight increase in the small left pneumothorax. Persistent adjacent opacity. Prostate increase in presumed atelectasis at the right lung base. Electronically Signed   By: Karen Kays M.D.   On: 02/11/2023 11:09   CT PERC PLEURAL DRAIN W/INDWELL CATH W/IMG GUIDE Result Date: 02/10/2023 INDICATION: 63 year old male with history of complex empyema with loculated component in the anterior left chest. EXAM: CT PERC PLEURAL DRAIN W/INDWELL CATH W/IMG GUIDE COMPARISON:  None Available. MEDICATIONS: The patient is currently admitted to the hospital and receiving intravenous antibiotics. The antibiotics were administered within an appropriate time frame prior to the initiation of the procedure. ANESTHESIA/SEDATION: Moderate (conscious) sedation was employed during this procedure. A total of Versed mg and Fentanyl mcg was administered intravenously. Moderate Sedation Time: minutes. The patient's  level of consciousness and vital signs were monitored continuously by radiology nursing throughout the procedure under my direct supervision. CONTRAST:  None COMPLICATIONS: None immediate. PROCEDURE: RADIATION DOSE REDUCTION: This exam was performed according to the departmental dose-optimization program which includes automated exposure control, adjustment of the mA and/or kV according to patient size and/or use of iterative reconstruction technique. Informed written consent was obtained from the patient after a discussion of the risks, benefits and alternatives to treatment. The patient was placed supine on the CT gantry and a pre procedural CT was performed re-demonstrating the known fluid collection within the anterior left pleural space. The procedure was planned. A timeout was performed prior to the initiation of the procedure. The left chest was prepped and draped in the usual sterile fashion. The overlying soft tissues were anesthetized with 1% lidocaine with epinephrine. Appropriate trajectory was planned with the use of a 22 gauge spinal needle. An 18 gauge trocar needle was advanced into  the pleural space and a short Amplatz super stiff wire was coiled within the collection. Appropriate positioning was confirmed with a limited CT scan. The tract was serially dilated allowing placement of a 14 French all-purpose drainage catheter. Appropriate positioning was confirmed with a limited postprocedural CT scan. A total of approximately 100 ml of purulent fluid was aspirated. Samples were sent to the lab for fluid analysis. The tube was connected to a pleurovac and sutured in place. A dressing was placed. The patient tolerated the procedure well without immediate post procedural complication. IMPRESSION: Successful CT guided placement of a 7 French all purpose drain catheter into the left anterior loculated empyema with immediate aspiration of approximately 100 mL purulent fluid. Samples were sent to the  laboratory as requested by the ordering clinical team. Marliss Coots, MD [facility] Electronically Signed   By: Marliss Coots M.D.   On: 02/10/2023 10:34   DG CHEST PORT 1 VIEW Result Date: 02/09/2023 CLINICAL DATA:  63 year old male with left hydropneumothorax, loculation, empyema. Chest tube placement. EXAM: PORTABLE CHEST 1 VIEW COMPARISON:  Chest CT 1035 hours today reported separately. FINDINGS: Portable AP semi upright view at 0736 hours. Pigtail left chest tube appears stable since yesterday. Regression of the post drainage left lower lung pleural air since yesterday. Otherwise on going veiling and confluent left mid and lower lung opacity. See additional details on CT. Right lung ventilation not significantly changed. Mediastinal contour stable. Visualized tracheal air column is within normal limits. No acute osseous abnormality identified. IMPRESSION: 1. Regression of post drainage left lower pleural air since yesterday. Stable left chest tube. 2. Otherwise stable chest, see additional details on CT today reported just now. Electronically Signed   By: Odessa Fleming M.D.   On: 02/09/2023 11:01   CT CHEST WO CONTRAST Result Date: 02/09/2023 CLINICAL DATA:  63 year old male with left hydropneumothorax, loculation, empyema. Chest tube placement. EXAM: CT CHEST WITHOUT CONTRAST TECHNIQUE: Multidetector CT imaging of the chest was performed following the standard protocol without IV contrast. RADIATION DOSE REDUCTION: This exam was performed according to the departmental dose-optimization program which includes automated exposure control, adjustment of the mA and/or kV according to patient size and/or use of iterative reconstruction technique. COMPARISON:  Portable chest radiographs yesterday, CT 02/07/2023. FINDINGS: Cardiovascular: Vascular patency is not evaluated in the absence of IV contrast. Heart size remains within normal limits. There is a small  pericardial effusion now suspected on series 3, image 132. Mild Calcified aortic atherosclerosis. Mediastinum/Nodes: Abnormal medial left anterior lung/pleural collection inseparable from the mediastinum including along the main pulmonary artery (series 3, image 93) further detailed below. No separate mediastinal mass. Mediastinal lymph nodes appear stable and reactive. But furthermore, there is moderate epicardial and superior diaphragmatic soft tissue stranding near the cardiac apex on series 3, image 126. Lungs/Pleura: Oval loculated lung or pleural collection along the anterior upper lobe contiguous with the mediastinum has trace internal gas (series 3, image 93 and otherwise measures fluid density encompassing 78 x 48 by 112 mm now (AP by transverse by CC) for an estimated volume of 200 mL (coronal image 99) this appears slightly larger since 02/07/2023. Interval subtotal drainage of the dominant loculated left lung or pleural collection seen previously with left lower lung pigtail drainage catheter now terminating on series 3, image 136. However, ongoing extensive left perihilar and lower lung consolidation there. Small loculated left pneumothorax or residual empyema along the lung periphery such as series 3, image 81. Overall  no significant improvement in left lung ventilation since the prior CT. Central airways remain patent. New small layering right pleural effusion (series 3, image 131). Increased patchy right lower lobe adjacent lung opacity with redemonstrated 14 mm lung nodule on series 4, image 116. Right upper lobe and right middle lobe are stable. Upper Abdomen: Negative visible noncontrast liver, spleen, pancreas, bowel in the upper abdomen. Stable visible adrenal glands and kidneys. Musculoskeletal: No acute or suspicious osseous lesion identified. Left chest wall edema, trace soft tissue gas following chest tube placement. IMPRESSION: 1. Pigtail left lower lung pleural catheter in place with  substantial drainage of the dominant pleural collection/empyema, but extensive ongoing left mid and lower lung Consolidation such that no significant improvement in left lung ventilation. 2. Separate loculated and un-drained left anterior chest/pleural collection/empyema which is inseparable from the mediastinum (see #3) and has an estimated volume of 200 mL (coronal image 99). 3. Small pericardial effusion is apparent now, with epicardial inflammatory stranding near the cardiac apex. Secondary Infectious Pericarditis not excluded. Reactive appearing mediastinal lymph nodes. 4. Small new layering right pleural effusion. Increasing right lower lobe opacity which could be atelectasis or infection. Electronically Signed   By: Odessa Fleming M.D.   On: 02/09/2023 10:59   ECHOCARDIOGRAM COMPLETE Result Date: 02/08/2023    ECHOCARDIOGRAM REPORT   Patient Name:   JERMICHAEL BELMARES Date of Exam: 02/08/2023 Medical Rec #:  [id number]    Height:       72.0 in Accession #:    [id number]   Weight:       149.9 lb Date of Birth:  November 27, 1960     BSA:          1.885 m Patient Age:    62 years     BP:           106/69 mmHg Patient Gender: M            HR:           75 bpm. Exam Location:  Inpatient Procedure: 2D Echo, Color Doppler and Cardiac Doppler Indications:    atrial fibrillation  History:        Patient has no prior history of Echocardiogram examinations.                 COPD and Sepsis; Risk Factors:Current Smoker.  Sonographer:    Delcie Roch RDCS Referring Phys: [id number] DANIEL R BENSIMHON IMPRESSIONS  1. Left ventricular ejection fraction, by estimation, is 45 to 50%. The left ventricle has mildly decreased function. The left ventricle demonstrates global hypokinesis. Left ventricular diastolic parameters were normal. The average left ventricular global longitudinal strain is -12.9 %. The global longitudinal strain is abnormal.  2. Right ventricular systolic function is normal. The right ventricular size is mildly enlarged.  Tricuspid regurgitation signal is inadequate for assessing PA pressure.  3. There is a small pericardial effusion, mostly around the LV apex and lateral wall, in the immediate vicinity of an area of complex loculated small left pleural effusion. There appears to be exaggerated respiratory displacement of the ventricular septum, suggesting ventricular interdependence. Consider a component of constrictive physiology. a small pericardial effusion is present. There is no evidence of cardiac tamponade.  4. The mitral valve is normal in structure. Trivial mitral valve regurgitation. No evidence of mitral stenosis.  5. The aortic valve is tricuspid. Aortic valve regurgitation is not visualized. No aortic stenosis is present.  6. The inferior vena cava is dilated in size with >50% respiratory  variability, suggesting right atrial pressure of 8 mmHg. FINDINGS  Left Ventricle: Left ventricular ejection fraction, by estimation, is 45 to 50%. The left ventricle has mildly decreased function. The left ventricle demonstrates global hypokinesis. The average left ventricular global longitudinal strain is -12.9 %. The global longitudinal strain is abnormal. The left ventricular internal cavity size was normal in size. There is no left ventricular hypertrophy. Left ventricular diastolic parameters were normal. Normal left ventricular filling pressure. Right Ventricle: The right ventricular size is mildly enlarged. No increase in right ventricular wall thickness. Right ventricular systolic function is normal. Tricuspid regurgitation signal is inadequate for assessing PA pressure. Left Atrium: Left atrial size was normal in size. Right Atrium: Right atrial size was normal in size. Pericardium: There is a small pericardial effusion, mostly around the LV apex and lateral wall, in the immediate vicinity of an area of complex loculated small left pleural effusion. There appears to be exaggerated respiratory displacement of the ventricular  septum, suggesting ventricular interdependence. Consider a component of constrictive physiology. A small pericardial effusion is present. There is no evidence of cardiac tamponade. Mitral Valve: The mitral valve is normal in structure. Trivial mitral valve regurgitation. No evidence of mitral valve stenosis. Tricuspid Valve: The tricuspid valve is normal in structure. Tricuspid valve regurgitation is not demonstrated. Aortic Valve: The aortic valve is tricuspid. Aortic valve regurgitation is not visualized. No aortic stenosis is present. Pulmonic Valve: The pulmonic valve was grossly normal. Pulmonic valve regurgitation is not visualized. No evidence of pulmonic stenosis. Aorta: The aortic root and ascending aorta are structurally normal, with no evidence of dilitation. Venous: The inferior vena cava is dilated in size with greater than 50% respiratory variability, suggesting right atrial pressure of 8 mmHg. IAS/Shunts: No atrial level shunt detected by color flow Doppler.  LEFT VENTRICLE PLAX 2D LVIDd:         4.30 cm   Diastology LVIDs:         3.40 cm   LV e' medial:    8.59 cm/s LV PW:         1.00 cm   LV E/e' medial:  9.0 LV IVS:        0.90 cm   LV e' lateral:   8.81 cm/s LVOT diam:     2.20 cm   LV E/e' lateral: 8.8 LV SV:         71 LV SV Index:   38        2D Longitudinal Strain LVOT Area:     3.80 cm  2D Strain GLS Avg:     -12.9 %  RIGHT VENTRICLE             IVC RV Basal diam:  3.00 cm     IVC diam: 2.30 cm RV S prime:     13.10 cm/s TAPSE (M-mode): 1.8 cm LEFT ATRIUM             Index        RIGHT ATRIUM           Index LA diam:        2.60 cm 1.38 cm/m   RA Area:     16.70 cm LA Vol (A2C):   61.9 ml 32.84 ml/m  RA Volume:   47.30 ml  25.09 ml/m LA Vol (A4C):   50.7 ml 26.90 ml/m LA Biplane Vol: 57.6 ml 30.56 ml/m  AORTIC VALVE LVOT Vmax:   106.00 cm/s LVOT Vmean:  66.100 cm/s LVOT VTI:  0.187 m  AORTA Ao Root diam: 3.50 cm Ao Asc diam:  3.40 cm MITRAL VALVE MV Area (PHT): 3.85 cm    SHUNTS  MV Decel Time: 197 msec    Systemic VTI:  0.19 m MV E velocity: 77.70 cm/s  Systemic Diam: 2.20 cm MV A velocity: 79.30 cm/s MV E/A ratio:  0.98 Mihai Croitoru MD Electronically signed by Thurmon Fair MD Signature Date/Time: 02/08/2023/4:53:23 PM    Final    DG CHEST PORT 1 VIEW Result Date: 02/08/2023 CLINICAL DATA:  63 year old male with left hydropneumothorax, loculation, suspicious for empyema. Chest tube placement. EXAM: PORTABLE CHEST 1 VIEW COMPARISON:  CT chest 1819 hours yesterday. Portable chest 0327 hours today. FINDINGS: Portable AP semi upright view at 1055 hours. Pigtail left lower chest tube now in place. Decreased left lung base opacification but continued left lower lung volume loss with rind like thickening and small volume loculated left lung base gas/pneumothorax now. Ongoing confluent and indistinct left perihilar opacity with some air bronchograms. Ongoing left hemithorax volume loss with some leftward mediastinal shift. Right lung appears stable, negative. Visualized tracheal air column is within normal limits. Stable visualized osseous structures. Negative bowel gas. IMPRESSION: 1. Left lower pigtail chest tube placed with evidence of some loculated fluid drainage, but poor lung inflation and associated left lower pleural air now. Ongoing left perihilar consolidation and opacity. 2. Stable right lung. Electronically Signed   By: Odessa Fleming M.D.   On: 02/08/2023 11:30   DG CHEST PORT 1 VIEW Result Date: 02/08/2023 CLINICAL DATA:  [id number] with left chest empyema and shortness of breath. EXAM: PORTABLE CHEST 1 VIEW COMPARISON:  Chest CT yesterday at 6:17 p.m. FINDINGS: 3:27 a.m. Large air fluid collections in the left hemithorax are noted obscuring the left lung from the level of the aortic knob down. The partially aerated left upper lung field demonstrates increased airspace disease concerning for worsening pneumonia. Nodular airspace disease in the right lower lung field was better seen on  CT, but appeared consistent with bronchopneumonia. Remainder of the lungs are clear with emphysematous changes. The cardiac size is normal. The mediastinal configuration is normal. Thoracic cage is intact. IMPRESSION: 1. Large air fluid collections in the left hemithorax obscuring the left lung from the level of the aortic knob down. 2. Increased airspace disease in the partially aerated left upper lung field concerning for worsening pneumonia. 3. Nodular airspace disease in the right lower lung field was better seen on CT, but appeared consistent with bronchopneumonia. Electronically Signed   By: Almira Bar M.D.   On: 02/08/2023 06:02   CT CHEST ABDOMEN PELVIS W CONTRAST Result Date: 02/07/2023 CLINICAL DATA:  Shortness of breath, tachycardia, sepsis. Large hydropneumothorax concerning for empyema. Right upper quadrant pain. EXAM: CT CHEST, ABDOMEN, AND PELVIS WITH CONTRAST TECHNIQUE: Multidetector CT imaging of the chest, abdomen and pelvis was performed following the standard protocol during bolus administration of intravenous contrast. RADIATION DOSE REDUCTION: This exam was performed according to the departmental dose-optimization program which includes automated exposure control, adjustment of the mA and/or kV according to patient size and/or use of iterative reconstruction technique. CONTRAST:  OMNIPAQUE IOHEXOL 300 MG/ML  SOLN COMPARISON:  Chest x-ray today FINDINGS: CT CHEST FINDINGS Cardiovascular: Heart is normal size. Aorta is normal caliber. Mediastinum/Nodes: Small scattered mediastinal lymph nodes, none pathologically enlarged. No mediastinal, hilar, or axillary adenopathy. Trachea and esophagus are unremarkable. Thyroid unremarkable. Lungs/Pleura: Mild centrilobular emphysema. Large air and fluid collection noted in the left hemithorax compressing the  left lung, measuring 19 x 18 x 10 cm. Separate fluid collection noted medially along the anterior mediastinum measuring 11 x 6.4 x 5.5 cm.  Airspace disease in the adjacent left lung. Clustered nodular airspace disease in the right lower lobe with largest nodule measuring up to 1.4 cm. There is airway thickening in the right lower lobe. No effusion on the right. Musculoskeletal: No acute bony abnormality. CT ABDOMEN PELVIS FINDINGS Hepatobiliary: No focal hepatic abnormality. Gallbladder unremarkable. Pancreas: No focal abnormality or ductal dilatation. Spleen: Small low-density area anteriorly in the spleen, likely small cyst. Normal size. No suspicious abnormality. Adrenals/Urinary Tract: Left adrenal nodule measures 1.2 cm and is stable since 2023 most compatible with adenoma. Right adrenal gland and kidneys unremarkable. No stones or hydronephrosis. Urinary bladder unremarkable. Stomach/Bowel: Stomach, large and small bowel grossly unremarkable. Vascular/Lymphatic: Aortoiliac atherosclerosis. No evidence of aneurysm or adenopathy. Reproductive: No visible focal abnormality. Other: No free fluid or free air. Musculoskeletal: No acute bony abnormality. IMPRESSION: Large air and fluid collections noted in the left chest as measured and described above. It is difficult to determine if these reflect pleural collections and thus hydropneumothorax and empyema or reflect pulmonary collections and abscesses. Compression of the underlying left lung noted in the left lower lobe with patchy airspace disease adjacent in the left upper lobe. Clustered nodular airspace disease in the right lower lobe with airway thickening. This likely reflects bronchopneumonia. No acute findings in the abdomen or pelvis. Emphysema. Electronically Signed   By: Charlett Nose M.D.   On: 02/07/2023 18:43   DG Chest 2 View Result Date: 02/07/2023 CLINICAL DATA:  Cough. EXAM: CHEST - 2 VIEW COMPARISON:  None Available. FINDINGS: AP projection demonstrates dense opacification of the LEFT floor lobe with complete obscuration of the LEFT heart. There is a long air-fluid level within the  LEFT lower lobe measuring 13 cm on lateral projection. Findings consistent with hydropneumothorax. RIGHT lung is clear. IMPRESSION: 1. Large hydropneumothorax in the LEFT lower lobe with long air-fluid level. Findings are concerning for infection or neoplasm of the LEFT lower lobe with accompanying empyema or pulmonary abscess. 2. Recommend CT thorax with contrast for further evaluation. Electronically Signed   By: Genevive Bi M.D.   On: 02/07/2023 16:36     I have personally spent 85 minutes involved in face-to-face and non-face-to-face activities for this patient on the day of the visit. Professional time spent includes the following activities: Preparing to see the patient (review of tests), Obtaining and/or reviewing separately obtained history (admission/discharge record), Performing a medically appropriate examination and/or evaluation , Ordering medications/tests/procedures, referring and communicating with other health care professionals, Documenting clinical information in the EMR, Independently interpreting results (not separately reported), Communicating results to the patient/family/caregiver, Counseling and educating the patient/family/caregiver and Care coordination (not separately reported).  Electronically signed by:   Plan d/w requesting provider as well as ID pharm D  Of note, portions of this note may have been created with voice recognition software. While this note has been edited for accuracy, occasional wrong-word or 'sound-a-like' substitutions may have occurred due to the inherent limitations of voice recognition software.   Odette Fraction, MD Infectious Disease Physician [facility] for Infectious Disease Pager: [phone]

## 2023-02-14 NOTE — Progress Notes (Addendum)
NAME:  Mathew Wyatt, MRN:  [id number], DOB:  03-18-1960, LOS: 6 ADMISSION DATE:  02/08/2023, CONSULTATION DATE: 02/08/2023  REFERRING MD: Add, CHIEF COMPLAINT: Hypotension, atrial fibrillation, large empyema on left with hydropneumothorax  History of Present Illness:  63 year old male with past medical history of tobacco use, COPD who presented to [facility] on 1/20 with cough and shortness of breath. Reportedly having worsening symptoms over the preceding 2 weeks. He denied having fever, chills, chest pain, n/v/d. He was found to be tachypneic with significant leukocytosis to 35.7, lactifc 4.4. he was started on IV antibiotics. He had CT chest which demonstrated a large left-sided loculated hydropneumothorax concerning for empyema or abscess. He was transferred to [facility] on 1/21 initially to hospitalist for CT surgery consult and possible VATS. On 1/21 he developed atrial fibrillation with RVR and hypotension and was transferred to [facility] and admitted to ICU.   Pertinent  Medical History  History reviewed. No pertinent past medical history.   Significant Hospital Events: Including procedures, antibiotic start and stop dates in addition to other pertinent events   1/20: [facility] ED with SOB. CT with large left hydropneumo c/w empyema vs abscess.  1/21: TCTS defer VATS. ICU for afib rvr hypotension, started on amiodarone. CT placed with immediate 2L purulent output. Initial tube lytics with additional 300cc output. ECHO neg for vegetation  1/22: repeat CT chest today to eval for need of additional CT vs more lytics vs observation.  1/23 new chest tube placed by IR, cultures growing Campylobacter and Peptostreptococcus 1/26 dec'd pleural output but has persistent loculated fluid collection  1/27 AF w/ RVR. Treated w/ BB  Interim History / Subjective:  Eager to get tubes out and move  Objective   Blood pressure 114/77, pulse (!) 102, temperature 98 F (36.7 C), temperature source Oral, resp. rate 16,  height 6' (1.829 m), weight 78.5 kg, SpO2 94%.        Intake/Output Summary (Last 24 hours) at 02/14/2023 0732 Last data filed at 02/14/2023 0720 Gross per 24 hour  Intake 2892.53 ml  Output 5100 ml  Net -2207.47 ml   Filed Weights   02/13/23 0429 02/13/23 2000 02/14/23 0500  Weight: 83.3 kg 83.3 kg 78.5 kg    Examination: General resting in bed no distress HENT NCAT no JVD Pulm clear. Dec on left. No air leak from either CT. Currently on room air. Pcxr no sig change. Dense left ant/lateral pleural vs consolidative changes persist. Overall aeration improved. Still has loculated air left base.  Card tachy irreg. AF w/ RVR currently  Ext warm RUE swelling (improved) Abd soft Neuro intact   Resolved problem list   Sepsis   Assessment & Plan:  left empyema - campylobacter and peptostreptococcus Pneumothorax due to lung entrapment, lack of lung expansion Afib AF w/ RVR Systolic HF (HFmrEF) Tobacco abuse Insomnia Pain    Pulm prob list   left empyema - campylobacter and peptostreptococcus Pneumothorax due to lung entrapment, lack of lung expansion  1/21 CT placed with 2L out.  1/23 New 2nd anterior chest tube placed by IR under CT guidance  Discussion -most recent cultures still pending from 1/23 -increased CT to 40 sxn on 1/26 no sig changed on film from 24th to 26th -pleural output: lateral CT no output over last 12 hrs. Ant CT: 50cc over last 12 hrs -airleak: negative   Plan Will cont current abx. Awaiting f/u cultures and ID consult Will keep CT # 2 (anterior) at 40 cm sxn Will  discuss w/ Dr Arvil Chaco re: CT #1 given minimal output might be reasonable to dc Cont PRN analgesia Am CXR    Best Practice (right click and "Reselect all SmartList Selections" daily)   Per primary

## 2023-02-14 NOTE — Progress Notes (Signed)
Peripherally Inserted Central Catheter Placement  The IV Nurse has discussed with the patient and/or persons authorized to consent for the patient, the purpose of this procedure and the potential benefits and risks involved with this procedure.  The benefits include less needle sticks, lab draws from the catheter, and the patient may be discharged home with the catheter. Risks include, but not limited to, infection, bleeding, blood clot (thrombus formation), and puncture of an artery; nerve damage and irregular heartbeat and possibility to perform a PICC exchange if needed/ordered by physician.  Alternatives to this procedure were also discussed.  Bard Power PICC patient education guide, fact sheet on infection prevention and patient information card has been provided to patient /or left at bedside.    PICC Placement Documentation  PICC Triple Lumen 02/14/23 Left Basilic 42 cm 0 cm (Active)  Indication for Insertion or Continuance of Line Administration of hyperosmolar/irritating solutions (i.e. TPN, Vancomycin, etc.) 02/14/23 1012  Exposed Catheter (cm) 0 cm 02/14/23 1012  Site Assessment Clean, Dry, Intact 02/14/23 1012  Lumen #1 Status Flushed;Blood return noted;Saline locked 02/14/23 1012  Lumen #2 Status Flushed;Blood return noted;Saline locked 02/14/23 1012  Lumen #3 Status Flushed;Blood return noted;Saline locked 02/14/23 1012  Dressing Type Transparent 02/14/23 1012  Dressing Status Antimicrobial disc/dressing in place 02/14/23 1012  Line Care Connections checked and tightened 02/14/23 1012  Line Adjustment (NICU/IV Team Only) No 02/14/23 1012  Dressing Intervention New dressing 02/14/23 1012  Dressing Change Due 02/21/23 02/14/23 1012       Gerri Acre Ramos 02/14/2023, 10:15 AM

## 2023-02-14 NOTE — Procedures (Addendum)
Pleural Fibrinolytic Administration Procedure Note  Gilad Dugger  [id number]  01/21/1960  Date:02/14/23  Time:5:48 PM   Provider Performing:Pete E Tanja Port   Procedure: Pleural Fibrinolysis Subsequent day (13086)  Indication(s) Fibrinolysis of complicated pleural effusion  Consent Risks of the procedure as well as the alternatives and risks of each were explained to the patient and/or caregiver.  Consent for the procedure was obtained.   Anesthesia None   Time Out Verified patient identification, verified procedure, site/side was marked, verified correct patient position, special equipment/implants available, medications/allergies/relevant history reviewed, required imaging and test results available.   Sterile Technique Hand hygiene, gloves   Procedure Description Existing pleural catheter was cleaned and accessed in sterile manner.  10mg  of tPA in 30cc of saline and 5mg  of dornase in 30cc of sterile water were injected into pleural space using existing pleural catheter from the posterior lateral access point.   Catheter will be clamped for 1 hour and then placed back to suction.   Complications/Tolerance None; patient tolerated the procedure well.  EBL None   Specimen(s) None

## 2023-02-14 NOTE — Progress Notes (Signed)
Cardiology Rounding Note  Cardiologist: None  Chief Complaint: SOB    Patient Profile   63 yo male with COPD and ongoing tobacco use. No h/o heart disease. Admitted with AF with RVR in setting of sepsis with massive left hydro/pneumo thorax. S/p emergent left chest tube with > 2L pus out. 2nd CT placed 1/23 for residual loculated empyema.   Subjective:    Pt without complaints. HR 140s today, atrial fib  Objective:   Weight Range: 78.5 kg Body mass index is 23.47 kg/m.   Vital Signs:   Temp:  [97.5 F (36.4 C)-98.4 F (36.9 C)] 98.4 F (36.9 C) (01/27 0700) Pulse Rate:  [89-140] 140 (01/27 0700) Resp:  [16-20] 16 (01/27 0252) BP: (105-128)/(50-77) 105/71 (01/27 0700) SpO2:  [91 %-95 %] 94 % (01/27 0400) Weight:  [78.5 kg-83.3 kg] 78.5 kg (01/27 0500) Last BM Date : 02/11/23  Weight change: Filed Weights   02/13/23 0429 02/13/23 2000 02/14/23 0500  Weight: 83.3 kg 83.3 kg 78.5 kg    Intake/Output:   Intake/Output Summary (Last 24 hours) at 02/14/2023 0757 Last data filed at 02/14/2023 0720 Gross per 24 hour  Intake 2892.53 ml  Output 5100 ml  Net -2207.47 ml      Physical Exam    General: Well developed, well nourished, NAD  HEENT: OP clear, mucus membranes moist  SKIN: warm, dry. No rashes. Neuro: No focal deficits  Musculoskeletal: Muscle strength 5/5 all ext  Psychiatric: Mood and affect normal  Neck: No JVD Lungs:Clear bilaterally, no wheezes, rhonci, crackles Cardiovascular: Irregular, tachy. No murmurs, gallops or rubs. Abdomen:Soft. Bowel sounds present. Non-tender.  Extremities: No lower extremity edema.     Telemetry   Atrial fib, rate 140s  personally reviewed   EKG    N/A   Labs    CBC Recent Labs    02/12/23 0214 02/14/23 0217  WBC 11.3* 11.5*  HGB 10.2* 10.6*  HCT 31.4* 32.8*  MCV 92.4 92.7  PLT 237 236   Basic Metabolic Panel Recent Labs    16/10/96 0204 02/14/23 0217  NA 134* 132*  K 3.7 3.9  CL 104 103   CO2 24 20*  GLUCOSE 158* 109*  BUN 9 5*  CREATININE 0.65 0.68  CALCIUM 7.5* 7.7*  MG 1.9 1.8  PHOS  --  2.7   Liver Function Tests No results for input(s): "AST", "ALT", "ALKPHOS", "BILITOT", "PROT", "ALBUMIN" in the last 72 hours.  No results for input(s): "LIPASE", "AMYLASE" in the last 72 hours.  Cardiac Enzymes No results for input(s): "CKTOTAL", "CKMB", "CKMBINDEX", "TROPONINI" in the last 72 hours.  BNP: BNP (last 3 results) No results for input(s): "BNP" in the last 8760 hours.  ProBNP (last 3 results) No results for input(s): "PROBNP" in the last 8760 hours.   D-Dimer No results for input(s): "DDIMER" in the last 72 hours. Hemoglobin A1C No results for input(s): "HGBA1C" in the last 72 hours. Fasting Lipid Panel No results for input(s): "CHOL", "HDL", "LDLCALC", "TRIG", "CHOLHDL", "LDLDIRECT" in the last 72 hours. Thyroid Function Tests No results for input(s): "TSH", "T4TOTAL", "T3FREE", "THYROIDAB" in the last 72 hours.  Invalid input(s): "FREET3"  Other results:   Imaging    DG CHEST PORT 1 VIEW Result Date: 02/13/2023 CLINICAL DATA:  Chest tube in place. EXAM: PORTABLE CHEST 1 VIEW COMPARISON:  Radiograph yesterday.  Chest CT 02/09/2023 FINDINGS: Two left-sided pigtail catheters are stable in positioning. Unchanged appearance of left basilar hydropneumothorax. Unchanged airspace disease throughout the left  mid and lower lung zones. Small right pleural effusion which was better demonstrated on CT. Stable heart size and mediastinal contours. IMPRESSION: 1. Unchanged positioning of 2 left basilar pigtail catheters. Unchanged left basilar hydropneumothorax. 2. Unchanged left lung airspace disease. Electronically Signed   By: Narda Rutherford M.D.   On: 02/13/2023 17:46      Medications:     Scheduled Medications:  amiodarone  200 mg Oral BID   Followed by   Melene Muller ON 02/27/2023] amiodarone  200 mg Oral Daily   Chlorhexidine Gluconate Cloth  6 each Topical  Q0600   Gerhardt's butt cream   Topical TID   methocarbamol (ROBAXIN) injection  500 mg Intravenous Q8H   Or   methocarbamol  500 mg Oral Q8H   metoprolol tartrate  12.5 mg Oral BID   mupirocin ointment   Nasal BID   nicotine  21 mg Transdermal Daily   oxyCODONE  10 mg Oral Q6H   pantoprazole  40 mg Oral Daily   polyethylene glycol  17 g Oral Daily   sodium chloride flush  10 mL Intrapleural Q8H   spironolactone  12.5 mg Oral Daily    Infusions:  ceFEPime (MAXIPIME) IV Stopped (02/14/23 0459)   heparin 2,600 Units/hr (02/14/23 0720)   magnesium sulfate bolus IVPB 50 mL/hr at 02/14/23 0720   metronidazole Stopped (02/13/23 2248)   vancomycin Stopped (02/14/23 0332)    PRN Medications: acetaminophen **OR** acetaminophen, docusate sodium, HYDROmorphone (DILAUDID) injection, hydrOXYzine, LORazepam, metoprolol tartrate, ondansetron **OR** ondansetron (ZOFRAN) IV, mouth rinse, zolpidem     Assessment/Plan   1. New onset AF with RVR: Pt has atrial fib earlier in the hospitalization and converted to sinus on amiodarone drip. Changed to po amiodarone yesterday. Recurrence of atrial fib with RVR last night. His atrial fibrillation is likely driven by his acute illness.  -Will restart IV amiodarone today - keep K > 4.0 and Mg > 2.0  -Continue heparin drip for anticoagulation, if tolerating and once chest tubes out and no procedures planned can transition to DOAC  2. Acute sepsis/PNA/empyema/Acute Hypoxic Respiratory Failure: Massive left hydro/pneumo thorax on admission s/p emergent left chest tube placement with >2 liters out. Antibiotics per primary team.   3. HFmrEF: Echo with LVEF=45-50% with global hypokinesis. 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 Plans to repeat echo when recovered from acute illness.      Verne Carrow, MD  02/14/2023, 7:57 AM

## 2023-02-14 NOTE — Progress Notes (Signed)
Pharmacy Antibiotic Note  Mathew Wyatt is a 63 y.o. male admitted on 02/08/2023 with  polymicrobial empyema, now status post 2 chest tube placements on 1/21 and 1/22 . Significant output since placement (>2L) however decreasing.  Pharmacy has been consulted for vancomycin, cefepime, and metronidazole dosing.   -WBC= 11.5, afebrile, SCr= 0.28 -pleural fluid cultures with campylobacter species and peptostreptococcus  Plan: Continue vancomycin  1500 mg q 12 hrs. Continue cefepime 2g q8h and metronidazole 500 mg q8h Vancomycin: goal AUC 400-550.  eAUC 506.  F/u final pleural cultures   Height: 6' (182.9 cm) Weight: 78.5 kg (173 lb 1 oz) IBW/kg (Calculated) : 77.6  Temp (24hrs), Avg:97.9 F (36.6 C), Min:97.5 F (36.4 C), Max:98.4 F (36.9 C)  Recent Labs  Lab 02/07/23 1731 02/07/23 1952 02/08/23 0112 02/08/23 0238 02/08/23 0457 02/08/23 0946 02/09/23 0318 02/10/23 1143 02/10/23 1458 02/11/23 0221 02/11/23 0908 02/12/23 0214 02/13/23 0204 02/14/23 0217  WBC  --   --   --    < >  --    < > 18.5*  --  17.3* 14.0*  --  11.3*  --  11.5*  CREATININE  --   --   --    < >  --    < > 0.58* 0.62  --  0.71  --  0.54* 0.65 0.68  LATICACIDVEN 4.4* 3.1* 2.3*  --  2.8*  --   --   --   --   --   --   --   --   --   VANCOTROUGH  --   --   --   --   --   --   --   --   --   --  5*  --   --   --    < > = values in this interval not displayed.    Estimated Creatinine Clearance: 105.1 mL/min (by C-G formula based on SCr of 0.68 mg/dL).    No Known Allergies  Antimicrobials this admission: Vanc 1/20 >> Cefepime 1/20 >> Flagyl 1/20 >>   Microbiology results: 1/20 BCX x1 [facility]): 1/21 pleural fluid: abundant GNR, GPC pairs/chains; campylobacter species and peptostreptococcus 1/21 MRSA PCR: positive  Thank you for allowing pharmacy to be a part of this patient's care.  [name], Colon Flattery, BCCP Clinical Pharmacist  02/14/2023 10:36 AM   [facility] pharmacy phone numbers are listed  on [email or web address]

## 2023-02-14 NOTE — Progress Notes (Signed)
Sat patient up in bed, HR up to 150 Afib RVR, pt in NAD.  BP 114/77.  Metoprolol 5 mg IV given as per prn med on MAR.  HR down to 115-120's.  BP remains stable.  Dr. Arlean Hopping notified.   No new orders at this time.

## 2023-02-15 ENCOUNTER — Inpatient Hospital Stay (HOSPITAL_COMMUNITY): Payer: Medicaid Other

## 2023-02-15 LAB — CBC
HCT: 29.8 % — ABNORMAL LOW (ref 39.0–52.0)
Hemoglobin: 9.6 g/dL — ABNORMAL LOW (ref 13.0–17.0)
MCH: 29.8 pg (ref 26.0–34.0)
MCHC: 32.2 g/dL (ref 30.0–36.0)
MCV: 92.5 fL (ref 80.0–100.0)
Platelets: 225 10*3/uL (ref 150–400)
RBC: 3.22 MIL/uL — ABNORMAL LOW (ref 4.22–5.81)
RDW: 16.5 % — ABNORMAL HIGH (ref 11.5–15.5)
WBC: 12 10*3/uL — ABNORMAL HIGH (ref 4.0–10.5)
nRBC: 0 % (ref 0.0–0.2)

## 2023-02-15 LAB — MISC LABCORP TEST (SEND OUT): Labcorp test code: 9985

## 2023-02-15 LAB — BASIC METABOLIC PANEL
Anion gap: 6 (ref 5–15)
BUN: 9 mg/dL (ref 8–23)
CO2: 23 mmol/L (ref 22–32)
Calcium: 8 mg/dL — ABNORMAL LOW (ref 8.9–10.3)
Chloride: 105 mmol/L (ref 98–111)
Creatinine, Ser: 0.68 mg/dL (ref 0.61–1.24)
GFR, Estimated: >60 mL/min (ref >60)
Glucose, Bld: 121 mg/dL — ABNORMAL HIGH (ref 70–99)
Potassium: 4.3 mmol/L (ref 3.5–5.1)
Sodium: 134 mmol/L — ABNORMAL LOW (ref 135–145)

## 2023-02-15 LAB — MAGNESIUM: Magnesium: 1.9 mg/dL (ref 1.7–2.4)

## 2023-02-15 MED ORDER — FUROSEMIDE 10 MG/ML IJ SOLN
40.0000 mg | Freq: Once | INTRAMUSCULAR | Status: AC
  Administered 2023-02-15: 40 mg via INTRAVENOUS
  Filled 2023-02-15: qty 4

## 2023-02-15 MED ORDER — POTASSIUM CHLORIDE CRYS ER 20 MEQ PO TBCR
40.0000 meq | EXTENDED_RELEASE_TABLET | Freq: Once | ORAL | Status: AC
  Administered 2023-02-15: 40 meq via ORAL
  Filled 2023-02-15: qty 2

## 2023-02-15 MED ORDER — STERILE WATER FOR INJECTION IJ SOLN
5.0000 mg | Freq: Once | RESPIRATORY_TRACT | Status: AC
  Administered 2023-02-15: 5 mg via INTRAPLEURAL
  Filled 2023-02-15: qty 5

## 2023-02-15 MED ORDER — SODIUM CHLORIDE (PF) 0.9 % IJ SOLN
10.0000 mg | Freq: Once | INTRAMUSCULAR | Status: AC
  Administered 2023-02-15: 10 mg via INTRAPLEURAL
  Filled 2023-02-15: qty 10

## 2023-02-15 MED ORDER — MAGNESIUM SULFATE 2 GM/50ML IV SOLN
2.0000 g | Freq: Once | INTRAVENOUS | Status: AC
  Administered 2023-02-15: 2 g via INTRAVENOUS
  Filled 2023-02-15: qty 50

## 2023-02-15 NOTE — Progress Notes (Addendum)
NAME:  Mathew Wyatt, MRN:  [id number], DOB:  September 11, 1960, LOS: 7 ADMISSION DATE:  02/08/2023, CONSULTATION DATE: 02/08/2023  REFERRING MD: Add, CHIEF COMPLAINT: Hypotension, atrial fibrillation, large empyema on left with hydropneumothorax  History of Present Illness:  63 year old male with past medical history of tobacco use, COPD who presented to [facility] on 1/20 with cough and shortness of breath. Reportedly having worsening symptoms over the preceding 2 weeks. He denied having fever, chills, chest pain, n/v/d. He was found to be tachypneic with significant leukocytosis to 35.7, lactifc 4.4. he was started on IV antibiotics. He had CT chest which demonstrated a large left-sided loculated hydropneumothorax concerning for empyema or abscess. He was transferred to [facility] on 1/21 initially to hospitalist for CT surgery consult and possible VATS. On 1/21 he developed atrial fibrillation with RVR and hypotension and was transferred to [facility] and admitted to ICU.   Pertinent  Medical History  History reviewed. No pertinent past medical history.   Significant Hospital Events: Including procedures, antibiotic start and stop dates in addition to other pertinent events   1/20: [facility] ED with SOB. CT with large left hydropneumo c/w empyema vs abscess.  1/21: TCTS defer VATS. ICU for afib rvr hypotension, started on amiodarone. CT placed with immediate 2L purulent output. Initial tube lytics with additional 300cc output. ECHO neg for vegetation, EF 45-50% 1/22: repeat CT chest today to eval for need of additional CT vs more lytics vs observation.  1/23 new chest tube placed by IR, cultures growing Campylobacter and Peptostreptococcus 1/26 dec'd pleural output but has persistent loculated fluid collection  1/27 AF w/ RVR. Treated w/ BB. Seen by ID; vanc stopped, switched to ceftriaxone, flagyl and azith for campylobacter. CT chest obtained. The anterior fluid collection has improved. The lateral effusion vol is  less but loculated air maintains. Now has Right layering effusion, and new patchy areas of GG changes. Some mild dec in left GG changes. Repeated round 4 pleural lytics (administered in right lateral tube)  1/28: lateral CT 120 net neg after accounting for TPA. AM cxr possible marginal Improvement in loculated ptx.   Interim History / Subjective:  No distress. Feels maybe a little better Objective   Blood pressure 109/63, pulse 90, temperature 98.6 F (37 C), temperature source Oral, resp. rate 20, height 6' (1.829 m), weight 83.4 kg, SpO2 93%.        Intake/Output Summary (Last 24 hours) at 02/15/2023 0732 Last data filed at 02/15/2023 0544 Gross per 24 hour  Intake 1656.33 ml  Output 1910 ml  Net -253.67 ml   Filed Weights   02/13/23 2000 02/14/23 0500 02/15/23 0424  Weight: 83.3 kg 78.5 kg 83.4 kg    Examination: General sitting up in bed. No distress HENT NCAT poor dentition no JVD Pulm coarse rhonchi on left. + tidal left lateral tube w/ purulent drainage of 110 ml since last evaluated. No air leak no output from anterior tube PCXR not much change but perhaps a little improvement in loculated PTX Card RRR now sr Abd soft Ext warm no edema  Neuro intact    Resolved problem list   Sepsis  Af w/ RVR Assessment & Plan:  left empyema - campylobacter and peptostreptococcus Pneumothorax due to lung entrapment, lack of lung expansion Multiple dental caries w/ possible dental abscess on tooth 19 and 20 Afib. Now back in NSR 1/28 Systolic HF (HFmrEF) Tobacco abuse Insomnia Pain    Pulm prob list   left empyema - campylobacter and  peptostreptococcus Pneumothorax due to lung entrapment, lack of lung expansion Poor dentition and possible dental abscess tooth 19 and 20  1/21 CT placed with 2L out.  1/23 New 2nd anterior chest tube placed by IR under CT guidance  Discussion -most recent cultures still pending from 1/23 -increased CT to 40 sxn on 1/26  -now s/p pleural  Lytic round 4. Got another -120 out of lateral tube. The anterior tube did not drain any fluid. CXR a little better perhaps. There is small right effusion now. Also some new scattered GG changes on right side. Wonder of some of this on the right is from his AF w/ RVR yesterday seems hard to believe this is a new infective process  Plan Abx as directed by ID We are going to shoot for 2 doses of pleural lytics today & keep CTs to sxn.  PRN pain rx Lasix x1 Am cxr  He is going to need dental extraction of at least tooth #19 and 20. Not sure that they would do this in-house. Could consider. Will defer to primary team  Hold the systemic AC until we are done w/ pleural lytic interventions    Best Practice (right click and "Reselect all SmartList Selections" daily)   Per primary  My time 36 minutes. Included patient counciling, exam, plan and consultation discussion of plan of care w/ both cards and bedside RN

## 2023-02-15 NOTE — Progress Notes (Addendum)
PROGRESS NOTE  Mathew Wyatt QIO:[id number] DOB: 03-Aug-1960 DOA: 02/08/2023 PCP: Patient, No Pcp Per   LOS: 7 days   Brief narrative:  Mathew Wyatt is a 63 y.o. male with past medical history of COPD and a smoker who had initially presented to [facility] hospital on 02/07/2023 with shortness of breath dyspnea on exertion with productive cough.  He was noted to be tachypneic with significant leukocytosis and elevated lactate.  Patient was started on IV antibiotic.  CT scan of the chest however was concerning for empyema versus abscess so he was transferred to [facility] on 02/08/2023 for further evaluation.  Patient however went into A-fib with RVR and hypotension and was admitted to ICU service.  PCCM placed a left-sided chest tube on 02/08/2023 and IR was subsequently consulted to put the second chest tube in the anterior pleural space for additional fluid drainage.  Patient was subsequently transferred out of the ICU to medical service.  During hospitalization, PCCM followed the patient for chest tube management, ID was consulted for antibiotics.  Patient also received received fibrinolytics, and awaiting for clinical improvement.    Assessment/Plan: Principal Problem:   Sepsis due to pneumonia [facility]) Active Problems:   Empyema (HCC)   Sepsis associated hypotension (HCC)   Need for management of chest tube   Hydropneumothorax   Pneumonia of left lower lobe due to infectious organism   Pressure injury of skin   Atrial fibrillation with rapid ventricular response (HCC)   Acute systolic heart failure (HCC)  Sepsis secondary to pneumonia with left-sided empyema leading to acute hypoxic respiratory failure.  Status post chest tube placement x2.  Second chest tube placement by IR 02/10/2023.  Currently on room air and is stable.  Patient was on vancomycin, cefepime and Flagyl and after ID has seen the patient patient has been switched to ceftriaxone Flagyl and azithromycin.  Orthopantogram has been  ordered. Plleural fluid culture showing Streptococcus. blood cultures negative so far in 5 days. WBC has trended down to 12.0 from initial 35.7.   CT surgery, pulmonary and ID following.   Continue pain management.  Postthrombolytic placement on 02/08/2023 and 02/15/2023.    Follow pulmonary recommendations on further chest tube management.  Might need VATS if not clinically improved.  New onset atrial fibrillation with RVR.  Was on amiodarone drip, cardiology following and hads been changed to amiodarone p.o but had recurrent of RVR so has been transitioned to amiodarone drip again.  Heparin drip as per pharmacy.  Continue electrolyte monitoring and supplementation as needed.  Rate controlled at this time.    Magnesium of 1.8.  Acute systolic congestive heart failure with moderately reduced ejection fraction.  2D echocardiogram 45 to 50% with global hypokinesis.  Likely tachycardia mediated heart failure in the setting of A-fib.  Cardiology following.    Latest magnesium 1.9.  History of COPD smoker.  Continue nebulizers.  Denies dyspnea shortness of breath.  Discomfort insomnia anxiety.  On muscle relaxant Atarax Ambien and pain medication.  States that he has some degree of anxiety.  DVT prophylaxis: Place and maintain sequential compression device Start: 02/14/23 1900 SCDs Start: 02/08/23 0845 SCDs Start: 02/08/23 0305   Disposition: Home when okay and improved clinically likely in 2 to 3 days..  Status is: Inpatient  Remains inpatient appropriate because: IV antibiotics chest tubes x 2, empyema, pending clinical improvement, IV antibiotics, A-fib with RVR on amiodarone drip..    Code Status:     Code Status: Full Code  Family  Communication: None at bedside  Consultants: CT surgery PCCM Interventional radiology Cardiology  Procedures: Left-sided chest tube placement x 2. Fibrinolytic treatment x 2  Anti-infectives:  Rocephin Flagyl and Zithromax.  Anti-infectives (From  admission, onward)    Start     Dose/Rate Route Frequency Ordered Stop   02/14/23 2200  metroNIDAZOLE (FLAGYL) tablet 500 mg        500 mg Oral Every 12 hours 02/14/23 1412     02/14/23 2000  cefTRIAXone (ROCEPHIN) 2 g in sodium chloride 0.9 % 100 mL IVPB        2 g 200 mL/hr over 30 Minutes Intravenous Every 24 hours 02/14/23 1410     02/14/23 1500  azithromycin (ZITHROMAX) tablet 500 mg        500 mg Oral Daily 02/14/23 1410     02/12/23 0200  vancomycin (VANCOREADY) IVPB 1500 mg/300 mL  Status:  Discontinued        1,500 mg 150 mL/hr over 120 Minutes Intravenous Every 12 hours 02/11/23 1301 02/14/23 1410   02/11/23 1430  vancomycin (VANCOCIN) 750 mg in sodium chloride 0.9 % 250 mL IVPB  Status:  Discontinued        750 mg 265 mL/hr over 60 Minutes Intravenous  Once 02/11/23 1309 02/11/23 1354   02/11/23 1430  vancomycin (VANCOCIN) 750 mg in sodium chloride 0.9 % 250 mL IVPB        750 mg 250 mL/hr over 60 Minutes Intravenous  Once 02/11/23 1354 02/11/23 1543   02/11/23 1400  vancomycin (VANCOREADY) IVPB 750 mg/150 mL  Status:  Discontinued        750 mg 150 mL/hr over 60 Minutes Intravenous  Once 02/11/23 1301 02/11/23 1309   02/10/23 2130  metroNIDAZOLE (FLAGYL) IVPB 500 mg  Status:  Discontinued        500 mg 100 mL/hr over 60 Minutes Intravenous Every 12 hours 02/10/23 1434 02/14/23 1410   02/08/23 1730  metroNIDAZOLE (FLAGYL) IVPB 500 mg  Status:  Discontinued        500 mg 100 mL/hr over 60 Minutes Intravenous Every 8 hours 02/08/23 1637 02/10/23 1434   02/08/23 0400  vancomycin (VANCOREADY) IVPB 750 mg/150 mL  Status:  Discontinued        750 mg 150 mL/hr over 60 Minutes Intravenous Every 12 hours 02/08/23 0324 02/08/23 0328   02/08/23 0400  ceFEPIme (MAXIPIME) 2 g in sodium chloride 0.9 % 100 mL IVPB  Status:  Discontinued        2 g 200 mL/hr over 30 Minutes Intravenous Every 8 hours 02/08/23 0324 02/14/23 1410   02/08/23 0400  vancomycin (VANCOCIN) 750 mg in sodium  chloride 0.9 % 250 mL IVPB  Status:  Discontinued        750 mg 250 mL/hr over 60 Minutes Intravenous Every 12 hours 02/08/23 0328 02/11/23 1258       Subjective: Today, patient was seen and examined at bedside.  Patient inquiring about removing the chest tube could be coming out.  Denies overt pain, nausea, vomiting, fever, chills or rigor.  Denies any shortness of breath or dyspnea.  Mildly anxious.  Objective: Vitals:   02/15/23 0424 02/15/23 0853  BP: 109/63 114/72  Pulse: 90 100  Resp: 20 15  Temp: 98.6 F (37 C) 97.7 F (36.5 C)  SpO2: 93% 92%    Intake/Output Summary (Last 24 hours) at 02/15/2023 1053 Last data filed at 02/15/2023 0817 Gross per 24 hour  Intake 1578.86 ml  Output  1610 ml  Net -31.14 ml   Filed Weights   02/13/23 2000 02/14/23 0500 02/15/23 0424  Weight: 83.3 kg 78.5 kg 83.4 kg   Body mass index is 24.94 kg/m.   Physical Exam:  GENERAL: Patient is alert awake and Communicative, not in obvious distress.  Mildly anxious  HENT: No scleral pallor or icterus. Pupils equally reactive to light. Oral mucosa is moist NECK: is supple, no gross swelling noted. CHEST: Left chest wall with chest tubes x 2.  Decreased breath  on the left side. CVS: S1 and S2 heard, no murmur. Regular rate and rhythm.  ABDOMEN: Soft, non-tender, bowel sounds are present. EXTREMITIES: No edema.  Left upper extremity PICC line in place. CNS: Cranial nerves are intact. No focal motor deficits. SKIN: warm and dry without rashes.  Data Review: I have personally reviewed the following laboratory data and studies,  CBC: Recent Labs  Lab 02/10/23 1458 02/11/23 0221 02/12/23 0214 02/14/23 0217 02/15/23 0440  WBC 17.3* 14.0* 11.3* 11.5* 12.0*  HGB 11.1* 10.5* 10.2* 10.6* 9.6*  HCT 34.2* 32.3* 31.4* 32.8* 29.8*  MCV 93.4 92.3 92.4 92.7 92.5  PLT 260 263 237 236 225   Basic Metabolic Panel: Recent Labs  Lab 02/09/23 0318 02/10/23 1143 02/11/23 0221 02/12/23 0214  02/13/23 0204 02/14/23 0217 02/15/23 0440  NA 138   < > 139 137 134* 132* 134*  K 2.9*   < > 3.0* 4.0 3.7 3.9 4.3  CL 108   < > 105 109 104 103 105  CO2 25   < > 23 24 24  20* 23  GLUCOSE 135*   < > 123* 133* 158* 109* 121*  BUN 18   < > 13 11 9  5* 9  CREATININE 0.58*   < > 0.71 0.54* 0.65 0.68 0.68  CALCIUM 7.9*   < > 8.1* 7.5* 7.5* 7.7* 8.0*  MG 1.9  --  1.8 2.2 1.9 1.8 1.9  PHOS 2.5  --   --   --   --  2.7  --    < > = values in this interval not displayed.   Liver Function Tests: Recent Labs  Lab 02/09/23 0318  AST 44*  ALT 26  ALKPHOS 117  BILITOT 1.2  PROT 5.6*  ALBUMIN <1.5*   No results for input(s): "LIPASE", "AMYLASE" in the last 168 hours.  No results for input(s): "AMMONIA" in the last 168 hours. Cardiac Enzymes: No results for input(s): "CKTOTAL", "CKMB", "CKMBINDEX", "TROPONINI" in the last 168 hours. BNP (last 3 results) No results for input(s): "BNP" in the last 8760 hours.  ProBNP (last 3 results) No results for input(s): "PROBNP" in the last 8760 hours.  CBG: No results for input(s): "GLUCAP" in the last 168 hours. Recent Results (from the past 240 hours)  Culture, blood (single)     Status: None   Collection Time: 02/07/23  5:31 PM   Specimen: BLOOD  Result Value Ref Range Status   Specimen Description BLOOD BLOOD LEFT ARM  Final   Special Requests   Final    BOTTLES DRAWN AEROBIC AND ANAEROBIC Blood Culture results may not be optimal due to an inadequate volume of blood received in culture bottles   Culture   Final    NO GROWTH 5 DAYS Performed at [facility], [address], [place], [place] [address]    Report Status 02/12/2023 FINAL  Final  MRSA Next Gen by PCR, Nasal     Status: Abnormal   Collection  Time: 02/08/23  9:34 AM   Specimen: Nasal Mucosa; Nasal Swab  Result Value Ref Range Status   MRSA by PCR Next Gen DETECTED (A) NOT DETECTED Final    Comment: RESULT CALLED TO, READ BACK BY AND VERIFIED WITH: RN christine h.  [id number] fcp (NOTE) The GeneXpert MRSA Assay (FDA approved for NASAL specimens only), is one component of a comprehensive MRSA colonization surveillance program. It is not intended to diagnose MRSA infection nor to guide or monitor treatment for MRSA infections. Test performance is not FDA approved in patients less than 40 years old. Performed at [facility], [address] [place], [place] [address]   Culture, blood (Routine X 2) w Reflex to ID Panel     Status: None   Collection Time: 02/08/23  9:46 AM   Specimen: BLOOD LEFT HAND  Result Value Ref Range Status   Specimen Description BLOOD LEFT HAND  Final   Special Requests   Final    BOTTLES DRAWN AEROBIC AND ANAEROBIC Blood Culture results may not be optimal due to an inadequate volume of blood received in culture bottles   Culture   Final    NO GROWTH 5 DAYS Performed at [facility], [address], [place], [place] [address]    Report Status 02/13/2023 FINAL  Final  Culture, blood (Routine X 2) w Reflex to ID Panel     Status: None   Collection Time: 02/08/23  9:54 AM   Specimen: BLOOD LEFT HAND  Result Value Ref Range Status   Specimen Description BLOOD LEFT HAND  Final   Special Requests   Final    BOTTLES DRAWN AEROBIC AND ANAEROBIC Blood Culture results may not be optimal due to an inadequate volume of blood received in culture bottles   Culture   Final    NO GROWTH 5 DAYS Performed at [facility], [address] [place], [place] [address]    Report Status 02/13/2023 FINAL  Final  Body fluid culture w Gram Stain     Status: None   Collection Time: 02/08/23 11:04 AM   Specimen: Pleural Fluid  Result Value Ref Range Status   Specimen Description PLEURAL  Final   Special Requests NONE  Final   Gram Stain   Final    ABUNDANT WBC PRESENT, PREDOMINANTLY PMN ABUNDANT GRAM NEGATIVE RODS ABUNDANT GRAM POSITIVE COCCI IN PAIRS IN CHAINS Gram Stain Report Called to,Read Back By and Verified With: C.  HICKLING [id number] @ 1519 FH    Culture   Final    MODERATE CAMPYLOBACTER SPECIES MODERATE PEPTOSTREPTOCOCCUS MICROS WITHIN MIXED ANAEROBIC ORGANISMS Performed at [facility], [address], [place], [place] [address]    Report Status 02/12/2023 FINAL  Final  Fungus Culture With Stain     Status: None (Preliminary result)   Collection Time: 02/08/23 11:04 AM   Specimen: Pleural Fluid  Result Value Ref Range Status   Fungus Stain Final report  Final    Comment: (NOTE) Performed At: [facility] [address] [place], [place] [id number] Jolene Schimke MD UU:[id number]    Fungus (Mycology) Culture PENDING  Incomplete   Fungal Source PLEURAL  Final    Comment: Performed at [facility], [address], [place], [place] [address]  Fungus Culture Result     Status: None   Collection Time: 02/08/23 11:04 AM  Result Value Ref Range Status   Result 1 Comment  Final    Comment: (  NOTE) KOH/Calcofluor preparation:  no fungus observed. Performed At: [facility] [address] [place], [place] [id number] Jolene Schimke MD QM:[id number]   Aerobic/Anaerobic Culture w Gram Stain (surgical/deep wound)     Status: Abnormal (Preliminary result)   Collection Time: 02/10/23  9:23 AM   Specimen: Abscess  Result Value Ref Range Status   Specimen Description ABSCESS  Final   Special Requests chest  Final   Gram Stain   Final    ABUNDANT WBC PRESENT, PREDOMINANTLY PMN ABUNDANT GRAM POSITIVE COCCI RARE GRAM POSITIVE RODS Performed at [facility], [address], [place], [place] [address]    Culture (A)  Final    STREPTOCOCCUS CONSTELLATUS SUSCEPTIBILITIES TO FOLLOW NO ANAEROBES ISOLATED; CULTURE IN PROGRESS FOR 5 DAYS    Report Status PENDING  Incomplete     Studies: DG Chest Port 1 View Result Date: 02/15/2023 CLINICAL DATA:  Left-sided pleural effusion. EXAM: PORTABLE CHEST 1 VIEW COMPARISON:  Chest radiograph dated 02/14/2023. FINDINGS: Interval  placement of a left-sided PICC with tip over central SVC. Similar positioning of 2 left-sided chest tubes. No significant interval change in the size of the left basilar pneumothorax and left lung opacities. Stable cardiac silhouette. No acute osseous pathology. IMPRESSION: 1. Interval placement of a left-sided PICC with tip over central SVC. 2. No significant interval change in the size of the left basilar pneumothorax and left lung opacities. Electronically Signed   By: Elgie Collard M.D.   On: 02/15/2023 09:47   CT CHEST WO CONTRAST Result Date: 02/14/2023 CLINICAL DATA:  Follow-up empyema.  Status post chest tube placement EXAM: CT CHEST WITHOUT CONTRAST TECHNIQUE: Multidetector CT imaging of the chest was performed following the standard protocol without IV contrast. RADIATION DOSE REDUCTION: This exam was performed according to the departmental dose-optimization program which includes automated exposure control, adjustment of the mA and/or kV according to patient size and/or use of iterative reconstruction technique. COMPARISON:  02/09/2023 FINDINGS: Cardiovascular: Heart size appears normal. Stable pericardial effusion, image 133/3. Aortic atherosclerosis. Coronary artery atherosclerotic calcifications. Mediastinum/Nodes: Thyroid gland, trachea appear normal. High density filling defect within the midthoracic esophagus measuring approximately 2.2 cm in length is noted in may reflect an ingested pill fragment, image 105/7. Multiple prominent but non pathologically enlarged mediastinal lymph nodes are identified which likely reflects reactive changes. Lungs/Pleura: There is a posterior layering right pleural effusion which is increased in volume from previous exam, now moderate. Overall interval improvement in interstitial and airspace disease within the left lung. There is residual scattered areas of interstitial thickening and ground-glass attenuation is identified within the left lung. New patchy areas  of interstitial thickening and ground-glass attenuation are also noted within the right upper lobe. Previous thick walled cavitary process within the anterolateral left upper lobe measures 4.8 x 3.7 by 4.3 cm, image 88/4. The cavitary component of this mass directly communicates with the loculated hydropneumothorax, image 96/4. On the previous exam this measured 5.3 by 3.8 by 4.4 cm. The lateral approach, left chest tube is stable in position from 02/09/2020 with pigtail over the posterior left base. The volume of loculated fluid has decreased in volume when compared with the exam from 02/09/2023. There is a progressive, loculated ex vacuo pneumothorax overlying the lateral left mid and left lower lung. Anterior approach left chest tube with pigtail along the paramediastinal left upper lobe is new when compared with 02/09/2020. The underlying anteromedial loculated pleural fluid collection has nearly completely resolved in the interval. Within the epicardial fat there  is a new area of loculated fluid anterior to the left side of heart measuring 4.7 by 1.8 by 5.7 cm, image 125/3. Upper Abdomen: No acute abnormality within the imaged portions of the upper abdomen. Musculoskeletal: There is mild edema extending along the left lateral chest wall. No acute or suspicious osseous findings. IMPRESSION: 1. Interval improvement in interstitial and airspace disease within the left lung. There are residual scattered areas of interstitial thickening and ground-glass attenuation is identified within the left lung. Findings are compatible with improving multifocal pneumonia. 2. The lateral approach, left chest tube is stable in position from 02/09/2020 with pigtail over the posterior left base. The volume of loculated fluid has decreased in volume when compared with the exam from 02/09/2023. There is a progressive, loculated ex vacuo pneumothorax overlying the lateral left mid and left lower lung. 3. Anterior approach left chest  tube with pigtail along the paramediastinal left upper lobe is new when compared with 02/09/2020. The underlying anteromedial loculated pleural fluid collection has nearly completely resolved in the interval. 4. Within the epicardial fat there is a new area of loculated fluid anterior to the left side of heart measuring 4.7 by 1.8 by 5.7 cm. 5. Posterior layering right pleural effusion is increased in volume from previous exam, now moderate. 6. New mild patchy areas of interstitial thickening and ground-glass attenuation within the right upper lobe, likely inflammatory/infectious. 7. Stable pericardial effusion. 8. High density filling defect within the midthoracic esophagus measuring approximately 2.2 cm in length is noted in may reflect an ingested pill fragment. 9.  Aortic Atherosclerosis (ICD10-I70.0). Electronically Signed   By: Signa Kell M.D.   On: 02/14/2023 20:39   DG Orthopantogram Result Date: 02/14/2023 CLINICAL DATA:  Poor dental hygiene and empyema. EXAM: ORTHOPANTOGRAM/PANORAMIC COMPARISON:  None Available. FINDINGS: Patient is edentulous of upper teeth. Dental carie of tooth 19, 20, 29, and 30. There may be a subtle periapical lucency about the dental caries of teeth 19 and 20. No frank bony destructive change. Postsurgical change in the left mandible plate and screw fixation. IMPRESSION: Dental caries of tooth 19, 20, 29, and 30. There may be a subtle periapical lucency about the dental caries of teeth 19 and 20. Electronically Signed   By: Narda Rutherford M.D.   On: 02/14/2023 20:38   DG CHEST PORT 1 VIEW Result Date: 02/14/2023 CLINICAL DATA:  Chest tube placement EXAM: PORTABLE CHEST 1 VIEW COMPARISON:  X-ray 02/13/2023 and older FINDINGS: Stable placement of 2 left basilar chest tubes, pigtail catheters. Stable associated left mid lower lung opacity and small left basilar pneumothorax. No pneumothorax on the right. Persistent parenchymal opacities at the right lung base. Stable  cardiopericardial silhouette with some volume loss on the left. No edema. Overlapping cardiac leads. IMPRESSION: No significant interval change. Electronically Signed   By: Karen Kays M.D.   On: 02/14/2023 12:13   [place] EKG SITE RITE Result Date: 02/14/2023 If Site Rite image not attached, placement could not be confirmed due to current cardiac rhythm.  DG CHEST PORT 1 VIEW Result Date: 02/13/2023 CLINICAL DATA:  Chest tube in place. EXAM: PORTABLE CHEST 1 VIEW COMPARISON:  Radiograph yesterday.  Chest CT 02/09/2023 FINDINGS: Two left-sided pigtail catheters are stable in positioning. Unchanged appearance of left basilar hydropneumothorax. Unchanged airspace disease throughout the left mid and lower lung zones. Small right pleural effusion which was better demonstrated on CT. Stable heart size and mediastinal contours. IMPRESSION: 1. Unchanged positioning of 2 left basilar pigtail catheters. Unchanged left  basilar hydropneumothorax. 2. Unchanged left lung airspace disease. Electronically Signed   By: Narda Rutherford M.D.   On: 02/13/2023 17:46      Joycelyn Das, MD  [facility] 02/15/2023  If 7PM-7AM, please contact night-coverage

## 2023-02-15 NOTE — Procedures (Cosign Needed Addendum)
Pleural Fibrinolytic Administration Procedure Note  Mathew Wyatt  [id number]  [birth year]  Date:02/15/23  Time:5:37 PM   Provider Performing:Pete E Tanja Port   Procedure: Pleural Fibrinolysis Subsequent day (40981)  Indication(s) Fibrinolysis of complicated pleural effusion  Consent Risks of the procedure as well as the alternatives and risks of each were explained to the patient and/or caregiver.  Consent for the procedure was obtained.   Anesthesia None   Time Out Verified patient identification, verified procedure, site/side was marked, verified correct patient position, special equipment/implants available, medications/allergies/relevant history reviewed, required imaging and test results available.   Sterile Technique Hand hygiene, gloves   Procedure Description Existing pleural catheter was cleaned and accessed in sterile manner.  10mg  of tPA in 30cc of saline and 5mg  of dornase in 30cc of sterile water were injected into pleural space using existing pleural catheter.  Catheter will be clamped for 1 hour and then placed back to suction.   Complications/Tolerance None; patient tolerated the procedure well.  EBL None   Specimen(s) None

## 2023-02-15 NOTE — Progress Notes (Signed)
Cardiology Rounding Note  Cardiologist: None   Patient Profile   63 yo male with COPD and ongoing tobacco use. No h/o heart disease. Admitted with AF with RVR in setting of sepsis with massive left hydro/pneumo thorax. S/p emergent left chest tube with > 2L pus out.  Subjective:    No complaints today. Converted to sinus  Objective:   Weight Range: 83.4 kg Body mass index is 24.94 kg/m.   Vital Signs:   Temp:  [97.4 F (36.3 C)-98.6 F (37 C)] 98.6 F (37 C) (01/28 0424) Pulse Rate:  [82-145] 90 (01/28 0424) Resp:  [15-20] 20 (01/28 0424) BP: (97-115)/(58-75) 109/63 (01/28 0424) SpO2:  [93 %-95 %] 93 % (01/28 0424) Weight:  [83.4 kg] 83.4 kg (01/28 0424) Last BM Date : 02/11/23  Weight change: Filed Weights   02/13/23 2000 02/14/23 0500 02/15/23 0424  Weight: 83.3 kg 78.5 kg 83.4 kg    Intake/Output:   Intake/Output Summary (Last 24 hours) at 02/15/2023 0748 Last data filed at 02/15/2023 0544 Gross per 24 hour  Intake 1656.33 ml  Output 1910 ml  Net -253.67 ml      Physical Exam   General: NAD SKIN: warm, dry Psychiatric: Normal affect Neck: No JVD Lungs:No wheezes Cardiovascular: Regular, no murmurs Abdomen: Soft, BS present Extremities: No LE edema  Telemetry   Sinus  personally reviewed   EKG    N/A   Labs    CBC Recent Labs    02/14/23 0217 02/15/23 0440  WBC 11.5* 12.0*  HGB 10.6* 9.6*  HCT 32.8* 29.8*  MCV 92.7 92.5  PLT 236 225   Basic Metabolic Panel Recent Labs    96/04/54 0204 02/14/23 0217 02/15/23 0440  NA 134* 132* 134*  K 3.7 3.9 4.3  CL 104 103 105  CO2 24 20* 23  GLUCOSE 158* 109* 121*  BUN 9 5* 9  CREATININE 0.65 0.68 0.68  CALCIUM 7.5* 7.7* 8.0*  MG 1.9 1.8  --   PHOS  --  2.7  --    Liver Function Tests No results for input(s): "AST", "ALT", "ALKPHOS", "BILITOT", "PROT", "ALBUMIN" in the last 72 hours.  No results for input(s): "LIPASE", "AMYLASE" in the last 72 hours.  Cardiac Enzymes No  results for input(s): "CKTOTAL", "CKMB", "CKMBINDEX", "TROPONINI" in the last 72 hours.  BNP: BNP (last 3 results) No results for input(s): "BNP" in the last 8760 hours.  ProBNP (last 3 results) No results for input(s): "PROBNP" in the last 8760 hours.   D-Dimer No results for input(s): "DDIMER" in the last 72 hours. Hemoglobin A1C No results for input(s): "HGBA1C" in the last 72 hours. Fasting Lipid Panel No results for input(s): "CHOL", "HDL", "LDLCALC", "TRIG", "CHOLHDL", "LDLDIRECT" in the last 72 hours. Thyroid Function Tests No results for input(s): "TSH", "T4TOTAL", "T3FREE", "THYROIDAB" in the last 72 hours.  Invalid input(s): "FREET3"  Other results:   Imaging     Medications:     Scheduled Medications:  azithromycin  500 mg Oral Daily   Chlorhexidine Gluconate Cloth  6 each Topical Q0600   Gerhardt's butt cream   Topical TID   methocarbamol (ROBAXIN) injection  500 mg Intravenous Q8H   Or   methocarbamol  500 mg Oral Q8H   metoprolol tartrate  12.5 mg Oral BID   metroNIDAZOLE  500 mg Oral Q12H   mupirocin ointment   Nasal BID   nicotine  21 mg Transdermal Daily   oxyCODONE  10 mg Oral Q6H  pantoprazole  40 mg Oral Daily   polyethylene glycol  17 g Oral Daily   sodium chloride flush  10 mL Intrapleural Q8H   sodium chloride flush  10-40 mL Intracatheter Q12H   spironolactone  12.5 mg Oral Daily    Infusions:  amiodarone 30 mg/hr (02/15/23 0544)   cefTRIAXone (ROCEPHIN)  IV 2 g (02/14/23 2028)    PRN Medications: acetaminophen **OR** acetaminophen, docusate sodium, HYDROmorphone (DILAUDID) injection, hydrOXYzine, LORazepam, metoprolol tartrate, ondansetron **OR** ondansetron (ZOFRAN) IV, mouth rinse, sodium chloride flush, zolpidem   Assessment/Plan   1. New onset AF with RVR: Converted to sinus on IV amiodarone. I would leave him on IV amiodarone today for further loading. Will likely change to po amiodarone tomorrow. His atrial fibrillation is  likely driven by his acute illness.  - keep K > 4.0 and Mg > 2.0  -Continue heparin drip for anticoagulation, if tolerating and once chest tubes out and no procedures planned can transition to DOAC. IV heparin to be paused today by primary team while giving pleural lytics.   2. Acute sepsis/PNA/empyema/Acute Hypoxic Respiratory Failure: Massive left hydro/pneumo thorax on admission s/p emergent left chest tube placement with >2 liters out. Antibiotics per primary team.   3. HFmrEF: Echo with LVEF=45-50% with global hypokinesis. 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 Plans to repeat echo when recovered from acute illness.   Verne Carrow, MD  02/15/2023, 7:48 AM

## 2023-02-15 NOTE — Plan of Care (Signed)
  Problem: Fluid Volume: Goal: Hemodynamic stability will improve Outcome: Progressing   Problem: Clinical Measurements: Goal: Diagnostic test results will improve Outcome: Progressing Goal: Signs and symptoms of infection will decrease Outcome: Progressing   Problem: Respiratory: Goal: Ability to maintain adequate ventilation will improve Outcome: Progressing   Problem: Education: Goal: Knowledge of General Education information will improve Description: Including pain rating scale, medication(s)/side effects and non-pharmacologic comfort measures Outcome: Progressing   Problem: Health Behavior/Discharge Planning: Goal: Ability to manage health-related needs will improve Outcome: Progressing   Problem: Clinical Measurements: Goal: Ability to maintain clinical measurements within normal limits will improve Outcome: Progressing Goal: Will remain free from infection Outcome: Progressing Goal: Diagnostic test results will improve Outcome: Progressing Goal: Respiratory complications will improve Outcome: Progressing Goal: Cardiovascular complication will be avoided Outcome: Progressing   Problem: Activity: Goal: Risk for activity intolerance will decrease Outcome: Not Progressing   Problem: Nutrition: Goal: Adequate nutrition will be maintained Outcome: Not Progressing   Problem: Coping: Goal: Level of anxiety will decrease Outcome: Progressing   Problem: Elimination: Goal: Will not experience complications related to bowel motility Outcome: Not Progressing Goal: Will not experience complications related to urinary retention Outcome: Progressing   Problem: Pain Managment: Goal: General experience of comfort will improve and/or be controlled Outcome: Progressing   Problem: Safety: Goal: Ability to remain free from injury will improve Outcome: Progressing   Problem: Skin Integrity: Goal: Risk for impaired skin integrity will decrease Outcome: Progressing

## 2023-02-16 ENCOUNTER — Other Ambulatory Visit: Payer: Self-pay

## 2023-02-16 ENCOUNTER — Inpatient Hospital Stay (HOSPITAL_COMMUNITY): Payer: Medicaid Other

## 2023-02-16 LAB — MAGNESIUM: Magnesium: 2 mg/dL (ref 1.7–2.4)

## 2023-02-16 LAB — BASIC METABOLIC PANEL
Anion gap: 10 (ref 5–15)
BUN: 11 mg/dL (ref 8–23)
CO2: 21 mmol/L — ABNORMAL LOW (ref 22–32)
Calcium: 7.9 mg/dL — ABNORMAL LOW (ref 8.9–10.3)
Chloride: 99 mmol/L (ref 98–111)
Creatinine, Ser: 0.67 mg/dL (ref 0.61–1.24)
GFR, Estimated: >60 mL/min (ref >60)
Glucose, Bld: 131 mg/dL — ABNORMAL HIGH (ref 70–99)
Potassium: 4 mmol/L (ref 3.5–5.1)
Sodium: 130 mmol/L — ABNORMAL LOW (ref 135–145)

## 2023-02-16 LAB — CBC
HCT: 29.3 % — ABNORMAL LOW (ref 39.0–52.0)
Hemoglobin: 9.4 g/dL — ABNORMAL LOW (ref 13.0–17.0)
MCH: 29.8 pg (ref 26.0–34.0)
MCHC: 32.1 g/dL (ref 30.0–36.0)
MCV: 93 fL (ref 80.0–100.0)
Platelets: 256 10*3/uL (ref 150–400)
RBC: 3.15 MIL/uL — ABNORMAL LOW (ref 4.22–5.81)
RDW: 16.8 % — ABNORMAL HIGH (ref 11.5–15.5)
WBC: 10.4 10*3/uL (ref 4.0–10.5)
nRBC: 0 % (ref 0.0–0.2)

## 2023-02-16 LAB — AEROBIC/ANAEROBIC CULTURE W GRAM STAIN (SURGICAL/DEEP WOUND)

## 2023-02-16 LAB — PHOSPHORUS: Phosphorus: 2.9 mg/dL (ref 2.5–4.6)

## 2023-02-16 MED ORDER — STERILE WATER FOR INJECTION IJ SOLN
5.0000 mg | Freq: Once | INTRAMUSCULAR | Status: AC
  Administered 2023-02-16: 5 mg via INTRAPLEURAL
  Filled 2023-02-16: qty 5

## 2023-02-16 MED ORDER — AMIODARONE HCL 200 MG PO TABS
200.0000 mg | ORAL_TABLET | Freq: Two times a day (BID) | ORAL | Status: DC
  Administered 2023-02-16: 200 mg via ORAL
  Filled 2023-02-16: qty 1

## 2023-02-16 MED ORDER — SODIUM CHLORIDE (PF) 0.9 % IJ SOLN
10.0000 mg | Freq: Once | INTRAMUSCULAR | Status: AC
  Administered 2023-02-16: 10 mg via INTRAPLEURAL
  Filled 2023-02-16: qty 10

## 2023-02-16 MED ORDER — AMIODARONE HCL IN DEXTROSE 360-4.14 MG/200ML-% IV SOLN
60.0000 mg/h | INTRAVENOUS | Status: AC
  Administered 2023-02-16 (×2): 60 mg/h via INTRAVENOUS
  Filled 2023-02-16 (×2): qty 200

## 2023-02-16 MED ORDER — AMIODARONE HCL IN DEXTROSE 360-4.14 MG/200ML-% IV SOLN
30.0000 mg/h | INTRAVENOUS | Status: DC
  Administered 2023-02-17 – 2023-02-20 (×8): 30 mg/h via INTRAVENOUS
  Filled 2023-02-16 (×8): qty 200

## 2023-02-16 MED ORDER — LACTULOSE 10 GM/15ML PO SOLN
20.0000 g | Freq: Two times a day (BID) | ORAL | Status: DC
  Administered 2023-02-16: 20 g via ORAL
  Filled 2023-02-16 (×4): qty 30

## 2023-02-16 MED ORDER — STERILE WATER FOR INJECTION IJ SOLN
5.0000 mg | Freq: Once | RESPIRATORY_TRACT | Status: AC
  Administered 2023-02-16: 5 mg via INTRAPLEURAL
  Filled 2023-02-16: qty 5

## 2023-02-16 MED ORDER — LIDOCAINE HCL 1 % IJ SOLN
5.0000 mL | Freq: Once | INTRAMUSCULAR | Status: DC
  Filled 2023-02-16 (×3): qty 5

## 2023-02-16 NOTE — Evaluation (Signed)
Physical Therapy Evaluation Patient Details Name: Mathew Wyatt MRN: [id number] DOB: [birth year] Today's Date: 02/16/2023  History of Present Illness  Pt is a 63yo male whou presented to [facility] on 02/07/23 with SOB, DOE, and productive cough. CT scan revealed L empyema vs abscess, was transferred to [facility] on 1/21, under went 2 chest tube placements on the L. Pt also went in afib with RVR. PMH: insomnia anxiety, acute systolic CHF, smoker, COPD   Clinical Impression  Pt admitted with above. Pt with noted anxiety and frustration about "how weak I've gotten" however deferred OOB to chair but did stand and begin stepping along EOB and sat EOB to eat lunch. 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 Anticipate as pt's pain decreases and pt's ability to breath improves pt will progress quickly and be safe to return home with girlfriend once medically stable. Acute PT to cont to follow.        If plan is discharge home, recommend the following: A lot of help with walking and/or transfers;A lot of help with bathing/dressing/bathroom;Help with stairs or ramp for entrance   Can travel by private vehicle        Equipment Recommendations Rolling walker (2 wheels)  Recommendations for Other Services       Functional Status Assessment Patient has had a recent decline in their functional status and demonstrates the ability to make significant improvements in function in a reasonable and predictable amount of time.     Precautions / Restrictions Precautions Precautions: Fall Precaution Comments: 2 chest tubes on the L Restrictions Weight Bearing Restrictions Per Provider Order: No      Mobility  Bed Mobility Overal bed mobility: Needs Assistance Bed Mobility: Rolling, Sidelying to Sit Rolling: Min assist Sidelying to sit: Mod assist, HOB elevated, Used rails       General bed mobility comments: increased time, modA for trunk elevation due to pain    Transfers Overall transfer level: Needs  assistance Equipment used: 1 person hand held assist Transfers: Sit to/from Stand Sit to Stand: Mod assist           General transfer comment: modA to power up, side stepped to [facility], pt shaky, noted SOB, and with report of dizziness    Ambulation/Gait               General Gait Details: limited to side steps to [facility] due to pt stating "it's hard for me to breath" and noted anxiety. pt refused to sit in chair stating "It is so uncomfortable" however also states "I'm so weak from laying in the bed." PT offered to put pillows and cushion in the chair however pt continued to defer but agreed to sit on EOB to eat lunch  Stairs            Wheelchair Mobility     Tilt Bed    Modified Rankin (Stroke Patients Only)       Balance Overall balance assessment: Needs assistance Sitting-balance support: Feet supported, No upper extremity supported Sitting balance-Leahy Scale: Fair     Standing balance support: Single extremity supported, During functional activity Standing balance-Leahy Scale: Poor                               Pertinent Vitals/Pain Pain Assessment Pain Assessment: Faces Faces Pain Scale: Hurts little more Pain Location: chest tubes/flank Pain Descriptors / Indicators: Sharp Pain Intervention(s): Monitored during session  Home Living Family/patient expects to be discharged to:: Private residence Living Arrangements: Spouse/significant other Available Help at Discharge: Family;Available 24 hours/day Type of Home: House Home Access: Stairs to enter Entrance Stairs-Rails: Can reach both Entrance Stairs-Number of Steps: 3   Home Layout: One level Home Equipment: None      Prior Function Prior Level of Function : Independent/Modified Independent             Mobility Comments: no AD, works as a Soil scientist ADLs Comments: indep     Extremity/Trunk Assessment   Upper Extremity Assessment Upper Extremity Assessment:  Generalized weakness    Lower Extremity Assessment Lower Extremity Assessment: Generalized weakness    Cervical / Trunk Assessment Cervical / Trunk Assessment: Other exceptions Cervical / Trunk Exceptions: 2 Left chest tubes  Communication   Communication Communication: No apparent difficulties  Cognition Arousal: Alert Behavior During Therapy: Anxious Overall Cognitive Status: No family/caregiver present to determine baseline cognitive functioning                                 General Comments: pt alert and oriented, anxious regarding movement due to pain and also regarding "I can't believe how weak I got."        General Comments General comments (skin integrity, edema, etc.): BP 109/68, SpO2 > 94%    Exercises General Exercises - Lower Extremity Ankle Circles/Pumps: AROM, Both, 10 reps, Seated Long Arc Quad: AROM, Both, 10 reps, Seated   Assessment/Plan    PT Assessment Patient needs continued PT services  PT Problem List Decreased strength;Decreased activity tolerance;Decreased balance;Decreased mobility;Cardiopulmonary status limiting activity       PT Treatment Interventions DME instruction;Gait training;Stair training;Functional mobility training;Therapeutic activities;Therapeutic exercise;Balance training    PT Goals (Current goals can be found in the Care Plan section)  Acute Rehab PT Goals Patient Stated Goal: get stronger and go home PT Goal Formulation: With patient Time For Goal Achievement: 03/02/23 Potential to Achieve Goals: Good    Frequency Min 1X/week     Co-evaluation               AM-PAC PT "6 Clicks" Mobility  Outcome Measure Help needed turning from your back to your side while in a flat bed without using bedrails?: A Lot Help needed moving from lying on your back to sitting on the side of a flat bed without using bedrails?: A Lot Help needed moving to and from a bed to a chair (including a wheelchair)?: A Lot Help  needed standing up from a chair using your arms (e.g., wheelchair or bedside chair)?: A Lot Help needed to walk in hospital room?: A Lot Help needed climbing 3-5 steps with a railing? : A Lot 6 Click Score: 12    End of Session   Activity Tolerance: Patient limited by pain;Patient limited by fatigue Patient left: in bed;with bed alarm set;with call bell/phone within reach (sitting EOB, RN aware) Nurse Communication: Mobility status (pt sitting EOB) PT Visit Diagnosis: Unsteadiness on feet (R26.81);Muscle weakness (generalized) (M62.81);Difficulty in walking, not elsewhere classified (R26.2)    Time: 1130-1147 PT Time Calculation (min) (ACUTE ONLY): 17 min   Charges:   PT Evaluation $PT Eval Moderate Complexity: 1 Mod   PT General Charges $$ ACUTE PT VISIT: 1 Visit         Lewis Shock, PT, DPT Acute Rehabilitation Services Secure chat preferred Office #: [phone]   Iona Hansen 02/16/2023,  1:36 PM

## 2023-02-16 NOTE — Progress Notes (Signed)
[facility] Infectious Diseases Follow Up Note  Patient Identification: Patient Name: Mathew Wyatt MRN: [id number] Admit Date: 02/08/2023  2:04 AM Age: 63 y.o.Today's Date: 02/16/2023  Reason for Visit: empyema   Principal Problem:   Sepsis due to pneumonia [facility]) Active Problems:   Empyema (HCC)   Sepsis associated hypotension (HCC)   Need for management of chest tube   Hydropneumothorax   Pneumonia of left lower lobe due to infectious organism   Pressure injury of skin   Atrial fibrillation with rapid ventricular response (HCC)   Acute systolic heart failure (HCC)  Antibiotics:  Vancomycin 1/20-1/27 Azithromycin 1/27- Cefepime 1/20-1/27, ceftriaxone 1/27- Metronidazole 1/20-   Lines/Hardware: Left arm PICC, Left side CT*2   Interval Events: Remains afebrile.  CBC and BMP with no significant abnormality.  224 mL and 150 mL output from 2 chest tubes respectively   Assessment 63 year old male with PMH of COPD, smoking who presented to the ED on 1/21 for progressive shortness of breath and cough ongoing for 2 weeks.  Initially seen at [facility] 1[facility]20  where workup was significant for large left-sided hydropneumothorax with elevated WBC and lactic acid and transferred to [facility] for CTVS eval.   # Left lung empyema  # Left pneumothorax due to lung entrapment - could be odontogenic source as #2 -1/21 status post pleural catheter insertion, pus was obtained.  Culture with Campylobacter species, Peptostreptococcus micros and mixed anaerobic organisms -1/23 CT-guided anterior left thoracostomy tube placement.  Culture with Streptococcus constellatus  - 1/27 CT chest with some improvement  - 02/08/23 HIV NR - Pulmonary following, getting IV pleural lytics, dose 6 today  # Dental caries 1/27 Orthopantogram: Dental caries of tooth 19, 20, 29, and 30. There may be a subtle periapical lucency about the dental caries of teeth 19 and 20.    Recommendations - Continue azithromycin, ceftriaxone and metronidazole for now pending sensi's of strep constellatus - CTVS has been consulted per Pulm, fu recs - Monitor CBC, CMP - Drain management per pulm and IR - Dental evaluation would be helpful - d/w primary   Rest of the management as per the primary team. Thank you for the consult. Please page with pertinent questions or concerns.  ______________________________________________________________________ Subjective patient seen and examined at the bedside. Complains of mild pain at the site of chest tube.  Breathing is better.  Denies fever, chills or cough.   Vitals BP 105/67 (BP Location: Right Arm)   Pulse 95   Temp 97.7 F (36.5 C) (Oral)   Resp 18   Ht 6' (1.829 m)   Wt 80.4 kg   SpO2 96%   BMI 24.04 kg/m     Physical Exam Constitutional: Adult male lying in the bed, not in acute distress    Comments: Poor dental hygiene with dental caries  Cardiovascular:     Rate and Rhythm: Normal rate and regular rhythm.     Heart sounds:   Pulmonary:     Effort: Pulmonary effort is normal on room air     Comments: 2  chest tube in the left side  Abdominal:     Palpations: Abdomen is nondistended    Tenderness:   Musculoskeletal:        General: No swelling or tenderness in peripheral joints  Skin:    Comments: No rashes  Neurological:     General: Awake, alert and oriented, following commands  Psychiatric:        Mood and Affect: Mood normal.   Pertinent Microbiology  Results for orders placed or performed during the hospital encounter of 02/08/23  MRSA Next Gen by PCR, Nasal     Status: Abnormal   Collection Time: 02/08/23  9:34 AM   Specimen: Nasal Mucosa; Nasal Swab  Result Value Ref Range Status   MRSA by PCR Next Gen DETECTED (A) NOT DETECTED Final    Comment: RESULT CALLED TO, READ BACK BY AND VERIFIED WITH: RN christine h. [phone] fcp (NOTE) The GeneXpert MRSA Assay (FDA approved for  NASAL specimens only), is one component of a comprehensive MRSA colonization surveillance program. It is not intended to diagnose MRSA infection nor to guide or monitor treatment for MRSA infections. Test performance is not FDA approved in patients less than 34 years old. Performed at [facility], [address], [place], [place] [address]   Culture, blood (Routine X 2) w Reflex to ID Panel     Status: None   Collection Time: 02/08/23  9:46 AM   Specimen: BLOOD LEFT HAND  Result Value Ref Range Status   Specimen Description BLOOD LEFT HAND  Final   Special Requests   Final    BOTTLES DRAWN AEROBIC AND ANAEROBIC Blood Culture results may not be optimal due to an inadequate volume of blood received in culture bottles   Culture   Final    NO GROWTH 5 DAYS Performed at [facility], [address], [place], [place] [address]    Report Status 02/13/2023 FINAL  Final  Culture, blood (Routine X 2) w Reflex to ID Panel     Status: None   Collection Time: 02/08/23  9:54 AM   Specimen: BLOOD LEFT HAND  Result Value Ref Range Status   Specimen Description BLOOD LEFT HAND  Final   Special Requests   Final    BOTTLES DRAWN AEROBIC AND ANAEROBIC Blood Culture results may not be optimal due to an inadequate volume of blood received in culture bottles   Culture   Final    NO GROWTH 5 DAYS Performed at [facility], [address] [place], [place] [address]    Report Status 02/13/2023 FINAL  Final  Body fluid culture w Gram Stain     Status: None   Collection Time: 02/08/23 11:04 AM   Specimen: Pleural Fluid  Result Value Ref Range Status   Specimen Description PLEURAL  Final   Special Requests NONE  Final   Gram Stain   Final    ABUNDANT WBC PRESENT, PREDOMINANTLY PMN ABUNDANT GRAM NEGATIVE RODS ABUNDANT GRAM POSITIVE COCCI IN PAIRS IN CHAINS Gram Stain Report Called to,Read Back By and Verified With: C. HICKLING [id number] @ 1519 FH    Culture   Final    MODERATE  CAMPYLOBACTER SPECIES MODERATE PEPTOSTREPTOCOCCUS MICROS WITHIN MIXED ANAEROBIC ORGANISMS Performed at [facility], [address], [place], [place] [address]    Report Status 02/12/2023 FINAL  Final  Fungus Culture With Stain     Status: None (Preliminary result)   Collection Time: 02/08/23 11:04 AM   Specimen: Pleural Fluid  Result Value Ref Range Status   Fungus Stain Final report  Final    Comment: (NOTE) Performed At: [facility] [address] [place], [place] [id number] Jolene Schimke MD KZ:[id number]    Fungus (Mycology) Culture PENDING  Incomplete   Fungal Source PLEURAL  Final    Comment: Performed at [facility], [address] [place], [place] [address]  Fungus Culture Result  Status: None   Collection Time: 02/08/23 11:04 AM  Result Value Ref Range Status   Result 1 Comment  Final    Comment: (NOTE) KOH/Calcofluor preparation:  no fungus observed. Performed At: [facility] [address] [place], [place] [id number] Jolene Schimke MD JX:[id number]   Aerobic/Anaerobic Culture w Gram Stain (surgical/deep wound)     Status: Abnormal   Collection Time: 02/10/23  9:23 AM   Specimen: Abscess  Result Value Ref Range Status   Specimen Description ABSCESS  Final   Special Requests chest  Final   Gram Stain   Final    ABUNDANT WBC PRESENT, PREDOMINANTLY PMN ABUNDANT GRAM POSITIVE COCCI RARE GRAM POSITIVE RODS    Culture (A)  Final    STREPTOCOCCUS CONSTELLATUS NO ANAEROBES ISOLATED Performed at [facility], [address] [place], [place] [address]    Report Status 02/16/2023 FINAL  Final   Organism ID, Bacteria STREPTOCOCCUS CONSTELLATUS  Final      Susceptibility   Streptococcus constellatus - MIC*    PENICILLIN <=0.06 SENSITIVE Sensitive     CEFTRIAXONE <=0.12 SENSITIVE Sensitive     ERYTHROMYCIN <=0.12 SENSITIVE Sensitive     LEVOFLOXACIN <=0.25 SENSITIVE Sensitive     VANCOMYCIN 0.5 SENSITIVE Sensitive     *  STREPTOCOCCUS CONSTELLATUS   Pertinent Lab.    Latest Ref Rng & Units 02/16/2023    2:25 AM 02/15/2023    4:40 AM 02/14/2023    2:17 AM  CBC  WBC 4.0 - 10.5 K/uL 10.4  12.0  11.5   Hemoglobin 13.0 - 17.0 g/dL 9.4  9.6  57.8   Hematocrit 39.0 - 52.0 % 29.3  29.8  32.8   Platelets 150 - 400 K/uL 256  225  236       Latest Ref Rng & Units 02/16/2023    2:25 AM 02/15/2023    4:40 AM 02/14/2023    2:17 AM  CMP  Glucose 70 - 99 mg/dL 469  629  528   BUN 8 - 23 mg/dL 11  9  5    Creatinine 0.61 - 1.24 mg/dL 4.13  2.44  0.10   Sodium 135 - 145 mmol/L 130  134  132   Potassium 3.5 - 5.1 mmol/L 4.0  4.3  3.9   Chloride 98 - 111 mmol/L 99  105  103   CO2 22 - 32 mmol/L 21  23  20    Calcium 8.9 - 10.3 mg/dL 7.9  8.0  7.7    Pertinent Imaging today Plain films and CT images have been personally visualized and interpreted; radiology reports have been reviewed. Decision making incorporated into the Impression /   DG Chest Port 1 View Result Date: 02/16/2023 CLINICAL DATA:  142230 Pleural effusion 142230 EXAM: PORTABLE CHEST 1 VIEW COMPARISON:  02/15/2023 FINDINGS: Left PICC line remains in place. Two pigtail pleural drainage catheters remain in place on the left. This small left basilar pneumothorax, slightly increased in size with pleural edge measuring 1.5 cm to the lateral chest wall (previously approximately 0.8 cm). Persistent peripheral opacity in the left lung. No right-sided pneumothorax. Stable cardiomegaly. IMPRESSION: Small left basilar pneumothorax, slightly increased in size. Two pigtail pleural drainage catheters remain in place on the left. Electronically Signed   By: Duanne Guess D.O.   On: 02/16/2023 09:19   DG Chest Port 1 View Result Date: 02/15/2023 CLINICAL DATA:  Left-sided pleural effusion. EXAM: PORTABLE CHEST 1 VIEW COMPARISON:  Chest radiograph dated 02/14/2023. FINDINGS: Interval placement  of a left-sided PICC with tip over central SVC. Similar positioning of 2 left-sided  chest tubes. No significant interval change in the size of the left basilar pneumothorax and left lung opacities. Stable cardiac silhouette. No acute osseous pathology. IMPRESSION: 1. Interval placement of a left-sided PICC with tip over central SVC. 2. No significant interval change in the size of the left basilar pneumothorax and left lung opacities. Electronically Signed   By: Elgie Collard M.D.   On: 02/15/2023 09:47   CT CHEST WO CONTRAST Result Date: 02/14/2023 CLINICAL DATA:  Follow-up empyema.  Status post chest tube placement EXAM: CT CHEST WITHOUT CONTRAST TECHNIQUE: Multidetector CT imaging of the chest was performed following the standard protocol without IV contrast. RADIATION DOSE REDUCTION: This exam was performed according to the departmental dose-optimization program which includes automated exposure control, adjustment of the mA and/or kV according to patient size and/or use of iterative reconstruction technique. COMPARISON:  02/09/2023 FINDINGS: Cardiovascular: Heart size appears normal. Stable pericardial effusion, image 133/3. Aortic atherosclerosis. Coronary artery atherosclerotic calcifications. Mediastinum/Nodes: Thyroid gland, trachea appear normal. High density filling defect within the midthoracic esophagus measuring approximately 2.2 cm in length is noted in may reflect an ingested pill fragment, image 105/7. Multiple prominent but non pathologically enlarged mediastinal lymph nodes are identified which likely reflects reactive changes. Lungs/Pleura: There is a posterior layering right pleural effusion which is increased in volume from previous exam, now moderate. Overall interval improvement in interstitial and airspace disease within the left lung. There is residual scattered areas of interstitial thickening and ground-glass attenuation is identified within the left lung. New patchy areas of interstitial thickening and ground-glass attenuation are also noted within the right upper  lobe. Previous thick walled cavitary process within the anterolateral left upper lobe measures 4.8 x 3.7 by 4.3 cm, image 88/4. The cavitary component of this mass directly communicates with the loculated hydropneumothorax, image 96/4. On the previous exam this measured 5.3 by 3.8 by 4.4 cm. The lateral approach, left chest tube is stable in position from 02/09/2020 with pigtail over the posterior left base. The volume of loculated fluid has decreased in volume when compared with the exam from 02/09/2023. There is a progressive, loculated ex vacuo pneumothorax overlying the lateral left mid and left lower lung. Anterior approach left chest tube with pigtail along the paramediastinal left upper lobe is new when compared with 02/09/2020. The underlying anteromedial loculated pleural fluid collection has nearly completely resolved in the interval. Within the epicardial fat there is a new area of loculated fluid anterior to the left side of heart measuring 4.7 by 1.8 by 5.7 cm, image 125/3. Upper Abdomen: No acute abnormality within the imaged portions of the upper abdomen. Musculoskeletal: There is mild edema extending along the left lateral chest wall. No acute or suspicious osseous findings. IMPRESSION: 1. Interval improvement in interstitial and airspace disease within the left lung. There are residual scattered areas of interstitial thickening and ground-glass attenuation is identified within the left lung. Findings are compatible with improving multifocal pneumonia. 2. The lateral approach, left chest tube is stable in position from 02/09/2020 with pigtail over the posterior left base. The volume of loculated fluid has decreased in volume when compared with the exam from 02/09/2023. There is a progressive, loculated ex vacuo pneumothorax overlying the lateral left mid and left lower lung. 3. Anterior approach left chest tube with pigtail along the paramediastinal left upper lobe is new when compared with  02/09/2020. The underlying anteromedial loculated pleural fluid collection  has nearly completely resolved in the interval. 4. Within the epicardial fat there is a new area of loculated fluid anterior to the left side of heart measuring 4.7 by 1.8 by 5.7 cm. 5. Posterior layering right pleural effusion is increased in volume from previous exam, now moderate. 6. New mild patchy areas of interstitial thickening and ground-glass attenuation within the right upper lobe, likely inflammatory/infectious. 7. Stable pericardial effusion. 8. High density filling defect within the midthoracic esophagus measuring approximately 2.2 cm in length is noted in may reflect an ingested pill fragment. 9.  Aortic Atherosclerosis (ICD10-I70.0). Electronically Signed   By: Signa Kell M.D.   On: 02/14/2023 20:39   DG Orthopantogram Result Date: 02/14/2023 CLINICAL DATA:  Poor dental hygiene and empyema. EXAM: ORTHOPANTOGRAM/PANORAMIC COMPARISON:  None Available. FINDINGS: Patient is edentulous of upper teeth. Dental carie of tooth 19, 20, 29, and 30. There may be a subtle periapical lucency about the dental caries of teeth 19 and 20. No frank bony destructive change. Postsurgical change in the left mandible plate and screw fixation. IMPRESSION: Dental caries of tooth 19, 20, 29, and 30. There may be a subtle periapical lucency about the dental caries of teeth 19 and 20. Electronically Signed   By: Narda Rutherford M.D.   On: 02/14/2023 20:38   I have personally spent 51 minutes involved in face-to-face and non-face-to-face activities for this patient on the day of the visit. Professional time spent includes the following activities: Preparing to see the patient (review of tests), Obtaining and/or reviewing separately obtained history (admission/discharge record), Performing a medically appropriate examination and/or evaluation , Ordering medications/tests/procedures, referring and communicating with other health care professionals,  Documenting clinical information in the EMR, Independently interpreting results (not separately reported), Communicating results to the patient/family/caregiver, Counseling and educating the patient/family/caregiver and Care coordination (not separately reported).   Plan d/w requesting provider as well as ID pharm D  Of note, portions of this note may have been created with voice recognition software. While this note has been edited for accuracy, occasional wrong-word or 'sound-a-like' substitutions may have occurred due to the inherent limitations of voice recognition software.   Electronically signed by:   Odette Fraction, MD Infectious Disease Physician [facility] for Infectious Disease Pager: [phone]

## 2023-02-16 NOTE — Procedures (Signed)
Pleural Fibrinolytic Administration Procedure Note  Mathew Wyatt  [id number]  04/25/1960  Date:02/16/23  Time:9:39 AM   Provider Performing:Pete E Tanja Port   Procedure: Pleural Fibrinolysis Subsequent day [phone])  Indication(s) Fibrinolysis of complicated pleural effusion  Consent Risks of the procedure as well as the alternatives and risks of each were explained to the patient and/or caregiver.  Consent for the procedure was obtained.   Anesthesia None   Time Out Verified patient identification, verified procedure, site/side was marked, verified correct patient position, special equipment/implants available, medications/allergies/relevant history reviewed, required imaging and test results available.   Sterile Technique Hand hygiene, gloves   Procedure Description Existing pleural catheter was cleaned and accessed in sterile manner.  10mg  of tPA in 30cc of saline and 5mg  of dornase in 30cc of sterile water were injected into pleural space using existing pleural catheter.  Catheter will be clamped for 1 hour and then placed back to suction.   Complications/Tolerance None; patient tolerated the procedure well.  EBL None   Specimen(s) None

## 2023-02-16 NOTE — Progress Notes (Addendum)
NAME:  Mathew Wyatt, MRN:  [id number], DOB:  [birth year], LOS: 8 ADMISSION DATE:  02/08/2023, CONSULTATION DATE: 02/08/2023  REFERRING MD: Add, CHIEF COMPLAINT: Hypotension, atrial fibrillation, large empyema on left with hydropneumothorax  History of Present Illness:  63 year old male with past medical history of tobacco use, COPD who presented to [facility] on 1/20 with cough and shortness of breath. Reportedly having worsening symptoms over the preceding 2 weeks. He denied having fever, chills, chest pain, n/v/d. He was found to be tachypneic with significant leukocytosis to 35.7, lactifc 4.4. he was started on IV antibiotics. He had CT chest which demonstrated a large left-sided loculated hydropneumothorax concerning for empyema or abscess. He was transferred to [facility] on 1/21 initially to hospitalist for CT surgery consult and possible VATS. On 1/21 he developed atrial fibrillation with RVR and hypotension and was transferred to [facility] and admitted to ICU.   Pertinent  Medical History  History reviewed. No pertinent past medical history.   Significant Hospital Events: Including procedures, antibiotic start and stop dates in addition to other pertinent events   1/20: [facility] ED with SOB. CT with large left hydropneumo c/w empyema vs abscess.  1/21: TCTS defer VATS. ICU for afib rvr hypotension, started on amiodarone. CT placed with immediate 2L purulent output. Initial tube lytics with additional 300cc output. ECHO neg for vegetation, EF 45-50% 1/22: repeat CT chest today to eval for need of additional CT vs more lytics vs observation.  1/23 new chest tube placed by IR, cultures growing Campylobacter and Peptostreptococcus 1/26 dec'd pleural output but has persistent loculated fluid collection  1/27 AF w/ RVR. Treated w/ BB. Seen by ID; vanc stopped, switched to ceftriaxone, flagyl and azith for campylobacter. CT chest obtained. The anterior fluid collection has improved. The lateral effusion vol is  less but loculated air maintains. Now has Right layering effusion, and new patchy areas of GG changes. Some mild dec in left GG changes. Repeated round 4 pleural lytics (administered in right lateral tube)  1/28: lateral CT 120 net neg after accounting for TPA. AM cxr possible marginal Improvement in loculated ptx. 1/29 no significant change after 5 doses of pleural lytic therapy.  Discussed case again with thoracic surgery who will reevaluate for possible VATS  Interim History / Subjective:  Frustrated because he is still in the hospital Objective   Blood pressure 105/67, pulse 95, temperature 97.7 F (36.5 C), temperature source Oral, resp. rate 18, height 6' (1.829 m), weight 80.4 kg, SpO2 96%.        Intake/Output Summary (Last 24 hours) at 02/16/2023 0844 Last data filed at 02/16/2023 0449 Gross per 24 hour  Intake 921.17 ml  Output 2879 ml  Net -1957.83 ml   Filed Weights   02/14/23 0500 02/15/23 0424 02/16/23 0400  Weight: 78.5 kg 83.4 kg 80.4 kg    Examination:  General this is a 63 year old male He is laying in bed. No distress.  HENT poor dentition, no JVD.  Mucous membranes are moist Pulmonary: Rhonchorous on the left side, right side is clear, remains on room air.  Bilateral chest tubes remain in place to the anterior tube has not drained anything, the lateral tube has purulent output drainage mentioned below there is no airleak noted in the chest tube airleak indicator Portable chest x-ray was personally reviewed.  He continues to have marked disease involving the left pleural space, the lateral loculated trapped air remains unchanged Cardiac: Regular rate and rhythm remains in sinus today Abdomen:  Soft nontender no organomegaly Extremities: Warm dry with brisk capillary refill Neuro: Awake oriented no focal deficits appreciated.   Resolved problem list   Sepsis  Af w/ RVR Assessment & Plan:  left empyema - campylobacter and peptostreptococcus Pneumothorax due to  lung entrapment, lack of lung expansion Multiple dental caries w/ possible dental abscess on tooth 19 and 20 Afib. Now back in NSR 1/28 Systolic HF (HFmrEF) Tobacco abuse Insomnia Pain   Pulm prob list   left empyema - campylobacter and peptostreptococcus Pneumothorax due to lung entrapment, lack of lung expansion Poor dentition and possible dental abscess tooth 19 and 20  1/21 CT placed with 2L out.  1/23 New 2nd anterior chest tube placed by IR under CT guidance  Discussion -most recent cultures sensitivities still pending from 1/23 -increased CT to 40 sxn on 1/26  -now s/p 5 doses of pleural lytics.  The anterior chest tube is really not putting out anything at this point, the lateral chest tube put out another 150 cc last night, and a total of 254 mL all day yesterday still purulent appearing really no change radiographically still with marked disease in the left pleural space, and area of trapped lung.  I have spoke with the thoracic surgery team.  Hoping they will reconsider him for surgery  Plan Abx as directed by ID We will repeat pleural lytics dose 6 this morning PRN pain rx Am cxr  He is going to need dental extraction of at least tooth #19 and 20. Not sure that they would do this in-house. Could consider. Will defer to primary team  Hold the systemic AC until we are done w/ pleural lytic interventions  Surgical reconsultation is pending   Best Practice (right click and "Reselect all SmartList Selections" daily)   Per primary  My time 38 minutes

## 2023-02-16 NOTE — Procedures (Signed)
Pleural Fibrinolytic Administration Procedure Note  Mathew Wyatt  [id number]  22-Apr-1960  Date:02/16/23  Time:6:22 PM   Provider Performing:Pete E Tanja Port   Procedure: Pleural Fibrinolysis Subsequent day (21308)  Indication(s) Fibrinolysis of complicated pleural effusion  Consent Risks of the procedure as well as the alternatives and risks of each were explained to the patient and/or caregiver.  Consent for the procedure was obtained.   Anesthesia None   Time Out Verified patient identification, verified procedure, site/side was marked, verified correct patient position, special equipment/implants available, medications/allergies/relevant history reviewed, required imaging and test results available.   Sterile Technique Hand hygiene, gloves   Procedure Description Existing pleural catheter was cleaned and accessed in sterile manner.  10mg  of tPA in 30cc of saline and 5mg  of dornase in 30cc of sterile water were injected into pleural space using existing pleural catheter.  Catheter will be clamped for 1 hour and then placed back to suction.   Complications/Tolerance None; patient tolerated the procedure well.  EBL None   Specimen(s) None

## 2023-02-16 NOTE — Progress Notes (Signed)
Cardiology Rounding Note  Cardiologist: Bjorn Pippin  Patient Profile   63 yo male with COPD and ongoing tobacco use. No h/o heart disease. Admitted with AF with RVR in setting of sepsis with massive left hydro/pneumo thorax. S/p emergent left chest tube with > 2L pus out.  Subjective:    No complaints today  Objective:   Weight Range: 80.4 kg Body mass index is 24.04 kg/m.   Vital Signs:   Temp:  [97.6 F (36.4 C)-98.7 F (37.1 C)] 98.7 F (37.1 C) (01/29 0400) Pulse Rate:  [96-100] 97 (01/29 0400) Resp:  [14-25] 14 (01/29 0400) BP: (96-122)/(66-77) 103/68 (01/29 0400) SpO2:  [90 %-97 %] 92 % (01/29 0400) Weight:  [80.4 kg] 80.4 kg (01/29 0400) Last BM Date : 02/11/23  Weight change: Filed Weights   02/14/23 0500 02/15/23 0424 02/16/23 0400  Weight: 78.5 kg 83.4 kg 80.4 kg    Intake/Output:   Intake/Output Summary (Last 24 hours) at 02/16/2023 0756 Last data filed at 02/16/2023 0449 Gross per 24 hour  Intake 963.7 ml  Output 2879 ml  Net -1915.3 ml      Physical Exam   General: NAD  HEENT: OP clear Neuro: No focal deficits  Musculoskeletal: Muscle strength 5/5 all ext  Psychiatric: Mood and affect normal  Neck: No JVD Lungs:Coarse rhonci left lung Cardiovascular: Regular rate and rhythm. Abdomen:Soft.  Extremities: No lower extremity edema.  Telemetry   Sinus  personally reviewed   EKG    N/A   Labs    CBC Recent Labs    02/15/23 0440 02/16/23 0225  WBC 12.0* 10.4  HGB 9.6* 9.4*  HCT 29.8* 29.3*  MCV 92.5 93.0  PLT 225 256   Basic Metabolic Panel Recent Labs    16/10/96 0217 02/15/23 0440 02/16/23 0225  NA 132* 134* 130*  K 3.9 4.3 4.0  CL 103 105 99  CO2 20* 23 21*  GLUCOSE 109* 121* 131*  BUN 5* 9 11  CREATININE 0.68 0.68 0.67  CALCIUM 7.7* 8.0* 7.9*  MG 1.8 1.9 2.0  PHOS 2.7  --  2.9   Liver Function Tests No results for input(s): "AST", "ALT", "ALKPHOS", "BILITOT", "PROT", "ALBUMIN" in the last 72 hours.  No  results for input(s): "LIPASE", "AMYLASE" in the last 72 hours.  Cardiac Enzymes No results for input(s): "CKTOTAL", "CKMB", "CKMBINDEX", "TROPONINI" in the last 72 hours.  BNP: BNP (last 3 results) No results for input(s): "BNP" in the last 8760 hours.  ProBNP (last 3 results) No results for input(s): "PROBNP" in the last 8760 hours.   D-Dimer No results for input(s): "DDIMER" in the last 72 hours. Hemoglobin A1C No results for input(s): "HGBA1C" in the last 72 hours. Fasting Lipid Panel No results for input(s): "CHOL", "HDL", "LDLCALC", "TRIG", "CHOLHDL", "LDLDIRECT" in the last 72 hours. Thyroid Function Tests No results for input(s): "TSH", "T4TOTAL", "T3FREE", "THYROIDAB" in the last 72 hours.  Invalid input(s): "FREET3"  Other results:   Imaging     Medications:     Scheduled Medications:  azithromycin  500 mg Oral Daily   Chlorhexidine Gluconate Cloth  6 each Topical Q0600   Gerhardt's butt cream   Topical TID   methocarbamol (ROBAXIN) injection  500 mg Intravenous Q8H   Or   methocarbamol  500 mg Oral Q8H   metoprolol tartrate  12.5 mg Oral BID   metroNIDAZOLE  500 mg Oral Q12H   mupirocin ointment   Nasal BID   nicotine  21 mg Transdermal  Daily   oxyCODONE  10 mg Oral Q6H   pantoprazole  40 mg Oral Daily   polyethylene glycol  17 g Oral Daily   sodium chloride flush  10 mL Intrapleural Q8H   sodium chloride flush  10-40 mL Intracatheter Q12H   spironolactone  12.5 mg Oral Daily    Infusions:  amiodarone 30 mg/hr (02/16/23 0449)   cefTRIAXone (ROCEPHIN)  IV Stopped (02/15/23 2026)    PRN Medications: acetaminophen **OR** acetaminophen, docusate sodium, HYDROmorphone (DILAUDID) injection, hydrOXYzine, LORazepam, metoprolol tartrate, ondansetron **OR** ondansetron (ZOFRAN) IV, mouth rinse, sodium chloride flush, zolpidem   Assessment/Plan   1. New onset AF with RVR: He is in sinus today. Will change to po amiodarone today. (Amiodarone 200 mg po  BID)His atrial fibrillation is likely driven by his acute illness. Continue IV heparin for now. Can change to a DOAC when all procedures are done.   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 We will plan to repeat his echo in several weeks.   Verne Carrow, MD  02/16/2023, 7:56 AM

## 2023-02-16 NOTE — Progress Notes (Signed)
Had arrived to room for dressing change.  The dressing was loose again and as we turned the lateral Chest tube came out of the pleural space onto bed.  An occlusive dressing was applied.  Plan Will cont to treat pleural space disease via the anterior CT but I worry because the two spaces don't seem to communicate and that we may not be able to adequately drain the space.  Will repeat CXR in am

## 2023-02-16 NOTE — Progress Notes (Signed)
PROGRESS NOTE  Heaven Wandell WJX:[id number] DOB: June 18, 1960 DOA: 02/08/2023 PCP: Patient, No Pcp Per   LOS: 8 days   Brief narrative:  [name] is a 63 y.o. male with past medical history of COPD and a smoker who had initially presented to [facility] on 02/07/2023 with shortness of breath dyspnea on exertion with productive cough.  He was noted to be tachypneic with significant leukocytosis and elevated lactate.  Patient was started on IV antibiotic.  CT scan of the chest however was concerning for empyema versus abscess so he was transferred to [facility] on 02/08/2023 for further evaluation.  Patient however went into A-fib with RVR and hypotension and was admitted to ICU service.   1/21: TCTS defer VATS. ICU for afib rvr hypotension, started on amiodarone. CT placed with immediate 2L purulent output. Initial tube lytics with additional 300cc output. ECHO neg for vegetation, EF 45-50% 1/22: repeat CT chest 1/23 new chest tube placed by IR, cultures growing Campylobacter and Peptostreptococcus 1/26 dec'd pleural output but has persistent loculated fluid collection  1/27 AF w/ RVR. Treated w/ BB. Seen by ID; vanc stopped, switched to ceftriaxone, flagyl and azith for campylobacter. CT chest obtained. The anterior fluid collection has improved. The lateral effusion vol is less but loculated air maintains. Now has Right layering effusion, and new patchy areas of GG changes. Some mild dec in left GG changes. Repeated round 4 pleural lytics (administered in right lateral tube)  1/28: lateral CT 120 net neg  1/29 no significant change after 5 doses of pleural lytic therapy.    Assessment/Plan:  Sepsis secondary to left empyema, pneumonia Acute hypoxic respiratory failure -Status post chest tube placement x2.  Second chest tube placement by IR 02/10/2023.  -On broad-spectrum antibiotics initially changed to ceftriaxone, Flagyl and azithromycin  -Orthopantogram with dental caries   -Pleural fluid cultures initially grew Campylobacter and Peptostreptococcus  -Repeat cultures with Streptococcus constellatus  -CT surgery, pulmonary and ID following.   Continue pain management.   -Has completed 5 days of pleural lytics without considerable improvement, T CTS consulting -Hold anticoagulation   New onset atrial fibrillation with RVR.   -Was on amiodarone drip, then changed to p.o., back on amiodarone gtt. again, switching back to oral amiodarone today, continue metoprolol -Hold heparin with empyema and ongoing fibrinolytic therapy  Acute systolic congestive heart failure with moderately reduced ejection fraction.  2D echocardiogram 45 to 50% with global hypokinesis. 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  Continue nebulizers.   Discomfort insomnia anxiety.  On muscle relaxant Atarax Ambien and pain medication   DVT prophylaxis: Place and maintain sequential compression device Start: 02/14/23 1900 SCDs Start: 02/08/23 0845 SCDs Start: 02/08/23 0305   Disposition: Home pending resolution of empyema    Code Status:     Code Status: Full Code  Family Communication: None at bedside  Consultants: CT surgery PCCM Interventional radiology Cardiology  Procedures: Left-sided chest tube placement x 2. Fibrinolytic treatment x 3  Anti-infectives:  Rocephin Flagyl and Zithromax.  Anti-infectives (From admission, onward)    Start     Dose/Rate Route Frequency Ordered Stop   02/14/23 2200  metroNIDAZOLE (FLAGYL) tablet 500 mg        500 mg Oral Every 12 hours 02/14/23 1412     02/14/23 2000  cefTRIAXone (ROCEPHIN) 2 g in sodium chloride 0.9 % 100 mL IVPB  2 g 200 mL/hr over 30 Minutes Intravenous Every 24 hours 02/14/23 1410     02/14/23 1500  azithromycin (ZITHROMAX) tablet 500 mg        500 mg Oral Daily 02/14/23 1410      02/12/23 0200  vancomycin (VANCOREADY) IVPB 1500 mg/300 mL  Status:  Discontinued        1,500 mg 150 mL/hr over 120 Minutes Intravenous Every 12 hours 02/11/23 1301 02/14/23 1410   02/11/23 1430  vancomycin (VANCOCIN) 750 mg in sodium chloride 0.9 % 250 mL IVPB  Status:  Discontinued        750 mg 265 mL/hr over 60 Minutes Intravenous  Once 02/11/23 1309 02/11/23 1354   02/11/23 1430  vancomycin (VANCOCIN) 750 mg in sodium chloride 0.9 % 250 mL IVPB        750 mg 250 mL/hr over 60 Minutes Intravenous  Once 02/11/23 1354 02/11/23 1543   02/11/23 1400  vancomycin (VANCOREADY) IVPB 750 mg/150 mL  Status:  Discontinued        750 mg 150 mL/hr over 60 Minutes Intravenous  Once 02/11/23 1301 02/11/23 1309   02/10/23 2130  metroNIDAZOLE (FLAGYL) IVPB 500 mg  Status:  Discontinued        500 mg 100 mL/hr over 60 Minutes Intravenous Every 12 hours 02/10/23 1434 02/14/23 1410   02/08/23 1730  metroNIDAZOLE (FLAGYL) IVPB 500 mg  Status:  Discontinued        500 mg 100 mL/hr over 60 Minutes Intravenous Every 8 hours 02/08/23 1637 02/10/23 1434   02/08/23 0400  vancomycin (VANCOREADY) IVPB 750 mg/150 mL  Status:  Discontinued        750 mg 150 mL/hr over 60 Minutes Intravenous Every 12 hours 02/08/23 0324 02/08/23 0328   02/08/23 0400  ceFEPIme (MAXIPIME) 2 g in sodium chloride 0.9 % 100 mL IVPB  Status:  Discontinued        2 g 200 mL/hr over 30 Minutes Intravenous Every 8 hours 02/08/23 0324 02/14/23 1410   02/08/23 0400  vancomycin (VANCOCIN) 750 mg in sodium chloride 0.9 % 250 mL IVPB  Status:  Discontinued        750 mg 250 mL/hr over 60 Minutes Intravenous Every 12 hours 02/08/23 0328 02/11/23 1258       Subjective: Feels better overall I will, wants to go home  Objective: Vitals:   02/16/23 0819 02/16/23 1100  BP: 105/67   Pulse: 95 83  Resp: 18 18  Temp: 97.7 F (36.5 C) 97.8 F (36.6 C)  SpO2: 96% 93%    Intake/Output Summary (Last 24 hours) at 02/16/2023 1246 Last data  filed at 02/16/2023 1116 Gross per 24 hour  Intake 801.17 ml  Output 1629 ml  Net -827.83 ml   Filed Weights   02/14/23 0500 02/15/23 0424 02/16/23 0400  Weight: 78.5 kg 83.4 kg 80.4 kg   Body mass index is 24.04 kg/m.   Physical Exam:  Gen: Awake, Alert, Oriented X 3,  HEENT: no JVD Lungs: Rhonchi on the left, clear on the right bilateral chest tubes in place CVS: S1S2/RRR Abd: soft, Non tender, non distended, BS present Extremities: No edema Skin: no new rashes on exposed skin   Data Review: I have personally reviewed the following laboratory data and studies,  CBC: Recent Labs  Lab 02/11/23 0221 02/12/23 0214 02/14/23 0217 02/15/23 0440 02/16/23 0225  WBC 14.0* 11.3* 11.5* 12.0* 10.4  HGB 10.5* 10.2* 10.6* 9.6* 9.4*  HCT 32.3* 31.4* 32.8*  29.8* 29.3*  MCV 92.3 92.4 92.7 92.5 93.0  PLT 263 237 236 225 256   Basic Metabolic Panel: Recent Labs  Lab 02/12/23 0214 02/13/23 0204 02/14/23 0217 02/15/23 0440 02/16/23 0225  NA 137 134* 132* 134* 130*  K 4.0 3.7 3.9 4.3 4.0  CL 109 104 103 105 99  CO2 24 24 20* 23 21*  GLUCOSE 133* 158* 109* 121* 131*  BUN 11 9 5* 9 11  CREATININE 0.54* 0.65 0.68 0.68 0.67  CALCIUM 7.5* 7.5* 7.7* 8.0* 7.9*  MG 2.2 1.9 1.8 1.9 2.0  PHOS  --   --  2.7  --  2.9   Liver Function Tests: No results for input(s): "AST", "ALT", "ALKPHOS", "BILITOT", "PROT", "ALBUMIN" in the last 168 hours.  No results for input(s): "LIPASE", "AMYLASE" in the last 168 hours.  No results for input(s): "AMMONIA" in the last 168 hours. Cardiac Enzymes: No results for input(s): "CKTOTAL", "CKMB", "CKMBINDEX", "TROPONINI" in the last 168 hours. BNP (last 3 results) No results for input(s): "BNP" in the last 8760 hours.  ProBNP (last 3 results) No results for input(s): "PROBNP" in the last 8760 hours.  CBG: No results for input(s): "GLUCAP" in the last 168 hours. Recent Results (from the past 240 hours)  Culture, blood (single)     Status: None    Collection Time: 02/07/23  5:31 PM   Specimen: BLOOD  Result Value Ref Range Status   Specimen Description BLOOD BLOOD LEFT ARM  Final   Special Requests   Final    BOTTLES DRAWN AEROBIC AND ANAEROBIC Blood Culture results may not be optimal due to an inadequate volume of blood received in culture bottles   Culture   Final    NO GROWTH 5 DAYS Performed at [facility], [address], [place], [place] [address]    Report Status 02/12/2023 FINAL  Final  MRSA Next Gen by PCR, Nasal     Status: Abnormal   Collection Time: 02/08/23  9:34 AM   Specimen: Nasal Mucosa; Nasal Swab  Result Value Ref Range Status   MRSA by PCR Next Gen DETECTED (A) NOT DETECTED Final    Comment: RESULT CALLED TO, READ BACK BY AND VERIFIED WITH: RN christine h. [phone] fcp (NOTE) The GeneXpert MRSA Assay (FDA approved for NASAL specimens only), is one component of a comprehensive MRSA colonization surveillance program. It is not intended to diagnose MRSA infection nor to guide or monitor treatment for MRSA infections. Test performance is not FDA approved in patients less than 56 years old. Performed at [facility], [address] [place], [place] [address]   Culture, blood (Routine X 2) w Reflex to ID Panel     Status: None   Collection Time: 02/08/23  9:46 AM   Specimen: BLOOD LEFT HAND  Result Value Ref Range Status   Specimen Description BLOOD LEFT HAND  Final   Special Requests   Final    BOTTLES DRAWN AEROBIC AND ANAEROBIC Blood Culture results may not be optimal due to an inadequate volume of blood received in culture bottles   Culture   Final    NO GROWTH 5 DAYS Performed at [facility], [address] [place], [place] [address]    Report Status 02/13/2023 FINAL  Final  Culture, blood (Routine X 2) w Reflex to ID Panel     Status: None   Collection Time: 02/08/23  9:54 AM   Specimen: BLOOD LEFT HAND  Result  Value Ref Range Status   Specimen Description BLOOD LEFT  HAND  Final   Special Requests   Final    BOTTLES DRAWN AEROBIC AND ANAEROBIC Blood Culture results may not be optimal due to an inadequate volume of blood received in culture bottles   Culture   Final    NO GROWTH 5 DAYS Performed at [facility], [address], [place], [place] [address]    Report Status 02/13/2023 FINAL  Final  Body fluid culture w Gram Stain     Status: None   Collection Time: 02/08/23 11:04 AM   Specimen: Pleural Fluid  Result Value Ref Range Status   Specimen Description PLEURAL  Final   Special Requests NONE  Final   Gram Stain   Final    ABUNDANT WBC PRESENT, PREDOMINANTLY PMN ABUNDANT GRAM NEGATIVE RODS ABUNDANT GRAM POSITIVE COCCI IN PAIRS IN CHAINS Gram Stain Report Called to,Read Back By and Verified With: C. HICKLING [id number] @ 1519 FH    Culture   Final    MODERATE CAMPYLOBACTER SPECIES MODERATE PEPTOSTREPTOCOCCUS MICROS WITHIN MIXED ANAEROBIC ORGANISMS Performed at [facility], [address], [place], [place] [address]    Report Status 02/12/2023 FINAL  Final  Fungus Culture With Stain     Status: None (Preliminary result)   Collection Time: 02/08/23 11:04 AM   Specimen: Pleural Fluid  Result Value Ref Range Status   Fungus Stain Final report  Final    Comment: (NOTE) Performed At: [facility] [address] [place], [place] [id number] Jolene Schimke MD ZH:[id number]    Fungus (Mycology) Culture PENDING  Incomplete   Fungal Source PLEURAL  Final    Comment: Performed at [facility], [address], [place], [place] [address]  Fungus Culture Result     Status: None   Collection Time: 02/08/23 11:04 AM  Result Value Ref Range Status   Result 1 Comment  Final    Comment: (NOTE) KOH/Calcofluor preparation:  no fungus observed. Performed At: [facility] [address] [place], [place] [id number] Jolene Schimke MD UV:[id number]   Aerobic/Anaerobic Culture w Gram Stain (surgical/deep wound)     Status:  Abnormal   Collection Time: 02/10/23  9:23 AM   Specimen: Abscess  Result Value Ref Range Status   Specimen Description ABSCESS  Final   Special Requests chest  Final   Gram Stain   Final    ABUNDANT WBC PRESENT, PREDOMINANTLY PMN ABUNDANT GRAM POSITIVE COCCI RARE GRAM POSITIVE RODS    Culture (A)  Final    STREPTOCOCCUS CONSTELLATUS NO ANAEROBES ISOLATED Performed at [facility], [address], [place], [place] [address]    Report Status 02/16/2023 FINAL  Final   Organism ID, Bacteria STREPTOCOCCUS CONSTELLATUS  Final      Susceptibility   Streptococcus constellatus - MIC*    PENICILLIN <=0.06 SENSITIVE Sensitive     CEFTRIAXONE <=0.12 SENSITIVE Sensitive     ERYTHROMYCIN <=0.12 SENSITIVE Sensitive     LEVOFLOXACIN <=0.25 SENSITIVE Sensitive     VANCOMYCIN 0.5 SENSITIVE Sensitive     * STREPTOCOCCUS CONSTELLATUS     Studies: DG Chest Port 1 View Result Date: 02/16/2023 CLINICAL DATA:  142230 Pleural effusion 142230 EXAM: PORTABLE CHEST 1 VIEW COMPARISON:  02/15/2023 FINDINGS: Left PICC line remains in place. Two pigtail pleural drainage catheters remain in place on the left. This small left basilar pneumothorax, slightly increased in size with pleural edge measuring 1.5 cm to the lateral chest  wall (previously approximately 0.8 cm). Persistent peripheral opacity in the left lung. No right-sided pneumothorax. Stable cardiomegaly. IMPRESSION: Small left basilar pneumothorax, slightly increased in size. Two pigtail pleural drainage catheters remain in place on the left. Electronically Signed   By: Duanne Guess D.O.   On: 02/16/2023 09:19   DG Chest Port 1 View Result Date: 02/15/2023 CLINICAL DATA:  Left-sided pleural effusion. EXAM: PORTABLE CHEST 1 VIEW COMPARISON:  Chest radiograph dated 02/14/2023. FINDINGS: Interval placement of a left-sided PICC with tip over central SVC. Similar positioning of 2 left-sided chest tubes. No significant interval change in the size of the  left basilar pneumothorax and left lung opacities. Stable cardiac silhouette. No acute osseous pathology. IMPRESSION: 1. Interval placement of a left-sided PICC with tip over central SVC. 2. No significant interval change in the size of the left basilar pneumothorax and left lung opacities. Electronically Signed   By: Elgie Collard M.D.   On: 02/15/2023 09:47   CT CHEST WO CONTRAST Result Date: 02/14/2023 CLINICAL DATA:  Follow-up empyema.  Status post chest tube placement EXAM: CT CHEST WITHOUT CONTRAST TECHNIQUE: Multidetector CT imaging of the chest was performed following the standard protocol without IV contrast. RADIATION DOSE REDUCTION: This exam was performed according to the departmental dose-optimization program which includes automated exposure control, adjustment of the mA and/or kV according to patient size and/or use of iterative reconstruction technique. COMPARISON:  02/09/2023 FINDINGS: Cardiovascular: Heart size appears normal. Stable pericardial effusion, image 133/3. Aortic atherosclerosis. Coronary artery atherosclerotic calcifications. Mediastinum/Nodes: Thyroid gland, trachea appear normal. High density filling defect within the midthoracic esophagus measuring approximately 2.2 cm in length is noted in may reflect an ingested pill fragment, image 105/7. Multiple prominent but non pathologically enlarged mediastinal lymph nodes are identified which likely reflects reactive changes. Lungs/Pleura: There is a posterior layering right pleural effusion which is increased in volume from previous exam, now moderate. Overall interval improvement in interstitial and airspace disease within the left lung. There is residual scattered areas of interstitial thickening and ground-glass attenuation is identified within the left lung. New patchy areas of interstitial thickening and ground-glass attenuation are also noted within the right upper lobe. Previous thick walled cavitary process within the  anterolateral left upper lobe measures 4.8 x 3.7 by 4.3 cm, image 88/4. The cavitary component of this mass directly communicates with the loculated hydropneumothorax, image 96/4. On the previous exam this measured 5.3 by 3.8 by 4.4 cm. The lateral approach, left chest tube is stable in position from 02/09/2020 with pigtail over the posterior left base. The volume of loculated fluid has decreased in volume when compared with the exam from 02/09/2023. There is a progressive, loculated ex vacuo pneumothorax overlying the lateral left mid and left lower lung. Anterior approach left chest tube with pigtail along the paramediastinal left upper lobe is new when compared with 02/09/2020. The underlying anteromedial loculated pleural fluid collection has nearly completely resolved in the interval. Within the epicardial fat there is a new area of loculated fluid anterior to the left side of heart measuring 4.7 by 1.8 by 5.7 cm, image 125/3. Upper Abdomen: No acute abnormality within the imaged portions of the upper abdomen. Musculoskeletal: There is mild edema extending along the left lateral chest wall. No acute or suspicious osseous findings. IMPRESSION: 1. Interval improvement in interstitial and airspace disease within the left lung. There are residual scattered areas of interstitial thickening and ground-glass attenuation is identified within the left lung. Findings are compatible with improving multifocal pneumonia.  2. The lateral approach, left chest tube is stable in position from 02/09/2020 with pigtail over the posterior left base. The volume of loculated fluid has decreased in volume when compared with the exam from 02/09/2023. There is a progressive, loculated ex vacuo pneumothorax overlying the lateral left mid and left lower lung. 3. Anterior approach left chest tube with pigtail along the paramediastinal left upper lobe is new when compared with 02/09/2020. The underlying anteromedial loculated pleural fluid  collection has nearly completely resolved in the interval. 4. Within the epicardial fat there is a new area of loculated fluid anterior to the left side of heart measuring 4.7 by 1.8 by 5.7 cm. 5. Posterior layering right pleural effusion is increased in volume from previous exam, now moderate. 6. New mild patchy areas of interstitial thickening and ground-glass attenuation within the right upper lobe, likely inflammatory/infectious. 7. Stable pericardial effusion. 8. High density filling defect within the midthoracic esophagus measuring approximately 2.2 cm in length is noted in may reflect an ingested pill fragment. 9.  Aortic Atherosclerosis (ICD10-I70.0). Electronically Signed   By: Signa Kell M.D.   On: 02/14/2023 20:39   DG Orthopantogram Result Date: 02/14/2023 CLINICAL DATA:  Poor dental hygiene and empyema. EXAM: ORTHOPANTOGRAM/PANORAMIC COMPARISON:  None Available. FINDINGS: Patient is edentulous of upper teeth. Dental carie of tooth 19, 20, 29, and 30. There may be a subtle periapical lucency about the dental caries of teeth 19 and 20. No frank bony destructive change. Postsurgical change in the left mandible plate and screw fixation. IMPRESSION: Dental caries of tooth 19, 20, 29, and 30. There may be a subtle periapical lucency about the dental caries of teeth 19 and 20. Electronically Signed   By: Narda Rutherford M.D.   On: 02/14/2023 20:38      Zannie Cove, MD  [facility] 02/16/2023  If 7PM-7AM, please contact night-coverage

## 2023-02-16 NOTE — Progress Notes (Signed)
Patient back in Afib on the monitor, EKG done to confirm, placed in chart. Su Hilt, NP notified. New orders to restart Amio drip. Amio drip restarted and explained to patient why it was restarted.

## 2023-02-16 NOTE — Procedures (Signed)
Pleural Fibrinolytic Administration Procedure Note  T[place]  [id number]  12-30-1960  Date:02/16/23  Time:9:40 AM   Provider Performing:Pete E Tanja Port   Procedure: Pleural Fibrinolysis Subsequent day (86578)  Indication(s) Fibrinolysis of complicated pleural effusion  Consent Risks of the procedure as well as the alternatives and risks of each were explained to the patient and/or caregiver.  Consent for the procedure was obtained.   Anesthesia None   Time Out Verified patient identification, verified procedure, site/side was marked, verified correct patient position, special equipment/implants available, medications/allergies/relevant history reviewed, required imaging and test results available.   Sterile Technique Hand hygiene, gloves   Procedure Description Existing pleural catheter was cleaned and accessed in sterile manner.  10mg  of tPA in 30cc of saline and 5mg  of dornase in 30cc of sterile water were injected into pleural space using existing pleural catheter.  Catheter will be clamped for 1 hour and then placed back to suction.   Complications/Tolerance None; patient tolerated the procedure well.  EBL None   Specimen(s) None

## 2023-02-16 NOTE — Plan of Care (Signed)
  Problem: Fluid Volume: Goal: Hemodynamic stability will improve Outcome: Progressing   Problem: Clinical Measurements: Goal: Diagnostic test results will improve Outcome: Progressing Goal: Signs and symptoms of infection will decrease Outcome: Progressing   Problem: Respiratory: Goal: Ability to maintain adequate ventilation will improve Outcome: Progressing   Problem: Education: Goal: Knowledge of General Education information will improve Description: Including pain rating scale, medication(s)/side effects and non-pharmacologic comfort measures Outcome: Progressing   Problem: Health Behavior/Discharge Planning: Goal: Ability to manage health-related needs will improve Outcome: Progressing   Problem: Clinical Measurements: Goal: Ability to maintain clinical measurements within normal limits will improve Outcome: Progressing Goal: Will remain free from infection Outcome: Progressing Goal: Diagnostic test results will improve Outcome: Progressing Goal: Respiratory complications will improve Outcome: Progressing Goal: Cardiovascular complication will be avoided Outcome: Progressing   Problem: Activity: Goal: Risk for activity intolerance will decrease Outcome: Not Progressing   Problem: Nutrition: Goal: Adequate nutrition will be maintained Outcome: Progressing   Problem: Coping: Goal: Level of anxiety will decrease Outcome: Progressing   Problem: Elimination: Goal: Will not experience complications related to bowel motility Outcome: Progressing Goal: Will not experience complications related to urinary retention Outcome: Progressing   Problem: Pain Managment: Goal: General experience of comfort will improve and/or be controlled Outcome: Progressing   Problem: Safety: Goal: Ability to remain free from injury will improve Outcome: Progressing   Problem: Skin Integrity: Goal: Risk for impaired skin integrity will decrease Outcome: Progressing

## 2023-02-17 ENCOUNTER — Ambulatory Visit: Payer: Self-pay | Admitting: *Deleted

## 2023-02-17 ENCOUNTER — Inpatient Hospital Stay (HOSPITAL_COMMUNITY): Payer: Medicaid Other

## 2023-02-17 ENCOUNTER — Other Ambulatory Visit (HOSPITAL_COMMUNITY): Payer: Self-pay

## 2023-02-17 DIAGNOSIS — J939 Pneumothorax, unspecified: Secondary | ICD-10-CM

## 2023-02-17 LAB — CBC
HCT: 30.1 % — ABNORMAL LOW (ref 39.0–52.0)
Hemoglobin: 9.6 g/dL — ABNORMAL LOW (ref 13.0–17.0)
MCH: 29.7 pg (ref 26.0–34.0)
MCHC: 31.9 g/dL (ref 30.0–36.0)
MCV: 93.2 fL (ref 80.0–100.0)
Platelets: 277 10*3/uL (ref 150–400)
RBC: 3.23 MIL/uL — ABNORMAL LOW (ref 4.22–5.81)
RDW: 16.9 % — ABNORMAL HIGH (ref 11.5–15.5)
WBC: 12.9 10*3/uL — ABNORMAL HIGH (ref 4.0–10.5)
nRBC: 0 % (ref 0.0–0.2)

## 2023-02-17 LAB — BASIC METABOLIC PANEL
Anion gap: 7 (ref 5–15)
BUN: 11 mg/dL (ref 8–23)
CO2: 23 mmol/L (ref 22–32)
Calcium: 7.8 mg/dL — ABNORMAL LOW (ref 8.9–10.3)
Chloride: 104 mmol/L (ref 98–111)
Creatinine, Ser: 0.68 mg/dL (ref 0.61–1.24)
GFR, Estimated: >60 mL/min (ref >60)
Glucose, Bld: 105 mg/dL — ABNORMAL HIGH (ref 70–99)
Potassium: 3.8 mmol/L (ref 3.5–5.1)
Sodium: 134 mmol/L — ABNORMAL LOW (ref 135–145)

## 2023-02-17 LAB — MAGNESIUM: Magnesium: 1.7 mg/dL (ref 1.7–2.4)

## 2023-02-17 LAB — TSH: TSH: 5.286 u[IU]/mL — ABNORMAL HIGH (ref 0.350–4.500)

## 2023-02-17 MED ORDER — SODIUM CHLORIDE 0.9 % IV SOLN
3.0000 g | Freq: Four times a day (QID) | INTRAVENOUS | Status: DC
  Administered 2023-02-17 – 2023-02-20 (×11): 3 g via INTRAVENOUS
  Filled 2023-02-17 (×11): qty 8

## 2023-02-17 MED ORDER — MAGNESIUM SULFATE 4 GM/100ML IV SOLN
4.0000 g | Freq: Once | INTRAVENOUS | Status: AC
  Administered 2023-02-17: 4 g via INTRAVENOUS
  Filled 2023-02-17: qty 100

## 2023-02-17 MED ORDER — ENSURE ENLIVE PO LIQD
237.0000 mL | Freq: Two times a day (BID) | ORAL | Status: DC
  Administered 2023-02-17 – 2023-03-07 (×21): 237 mL via ORAL

## 2023-02-17 NOTE — Progress Notes (Signed)
Physical Therapy Treatment Patient Details Name: Mathew Wyatt MRN: [id number] DOB: [birth year] Today's Date: 02/17/2023   History of Present Illness Pt is a 62yo male whou presented to [facility] on 02/07/23 with SOB, DOE, and productive cough. CT scan revealed L empyema vs abscess, was transferred to [facility] on 1/21, under went 2 chest tube placements on the L. Pt also went in afib with RVR. PMH: insomnia anxiety, acute systolic CHF, smoker, COPD    PT Comments  Pt is moving well. Self limiting. Continues below baseline level of functioning. Currently supervision for bed mobility, sit to stand and side stepping with RW. Pt adamantly declined gait stating he was too weak. Pt was educated on the importance of mobility and his strength and function. Pt sat EOB at end of session and encouraged to sit up as much as possible. Due to pt current functional status, home set up and available assistance at home recommending skilled physical therapy services 3x/week in order to address strength, balance and functional mobility to decrease risk for falls, injury and re-hospitalization.       If plan is discharge home, recommend the following: A lot of help with walking and/or transfers;Help with stairs or ramp for entrance;Assistance with cooking/housework;Assist for transportation     Equipment Recommendations  Rolling walker (2 wheels)       Precautions / Restrictions Precautions Precautions: Fall Precaution Comments: 1 chest tube Restrictions Weight Bearing Restrictions Per Provider Order: No     Mobility  Bed Mobility Overal bed mobility: Needs Assistance Bed Mobility: Rolling, Sidelying to Sit Rolling: Supervision Sidelying to sit: Supervision, HOB elevated       General bed mobility comments: increased time. No need for physical assistance.    Transfers Overall transfer level: Needs assistance Equipment used: Rolling walker (2 wheels) Transfers: Sit to/from Stand Sit to Stand: Supervision            General transfer comment: supervision with verbal cues for correct hand placement for standing with AD. Pt was able to scoot EOB at Mod I    Ambulation/Gait   Pre-gait activities: Pt took side steps at EOB at Supervision with RW. General Gait Details: Pt adamantly refuses gait. States he is too weak despite maximum education on the importance of mobility     Balance Overall balance assessment: Needs assistance Sitting-balance support: Feet supported, No upper extremity supported Sitting balance-Leahy Scale: Fair     Standing balance support: During functional activity, Bilateral upper extremity supported Standing balance-Leahy Scale: Fair        Cognition Arousal: Alert Behavior During Therapy: Anxious Overall Cognitive Status: No family/caregiver present to determine baseline cognitive functioning       General Comments: pt alert and oriented, anxious regarding movement due to pain and also regarding "I can't believe how weak I got."           General Comments General comments (skin integrity, edema, etc.): O2 sats 92% on room air HR 89 bpm. Some serous drainage from old chest tube opening on the L. RN notified and came in to re-do dressing.      Pertinent Vitals/Pain Pain Assessment Faces Pain Scale: Hurts little more Breathing: normal Pain Location: chest tubes/flank Pain Descriptors / Indicators: Sharp Pain Intervention(s): Monitored during session, Premedicated before session     PT Goals (current goals can now be found in the care plan section) Acute Rehab PT Goals Patient Stated Goal: get stronger and go home PT Goal Formulation: With patient Time For Goal Achievement:  03/02/23 Potential to Achieve Goals: Good Progress towards PT goals: Progressing toward goals    Frequency    Min 1X/week      PT Plan  Continue with current POC        AM-PAC PT "6 Clicks" Mobility   Outcome Measure  Help needed turning from your back to your side  while in a flat bed without using bedrails?: A Little Help needed moving from lying on your back to sitting on the side of a flat bed without using bedrails?: A Little Help needed moving to and from a bed to a chair (including a wheelchair)?: A Little Help needed standing up from a chair using your arms (e.g., wheelchair or bedside chair)?: A Little Help needed to walk in hospital room?: A Little Help needed climbing 3-5 steps with a railing? : A Little 6 Click Score: 18    End of Session   Activity Tolerance: Patient limited by pain;Patient limited by fatigue;Other (comment) (anxiety) Patient left: in bed;with bed alarm set;with call bell/phone within reach Nurse Communication: Mobility status PT Visit Diagnosis: Unsteadiness on feet (R26.81);Muscle weakness (generalized) (M62.81);Difficulty in walking, not elsewhere classified (R26.2)     Time: 4098-1191 PT Time Calculation (min) (ACUTE ONLY): 28 min  Charges:    $Therapeutic Activity: 23-37 mins PT General Charges $$ ACUTE PT VISIT: 1 Visit       Harrel Carina, DPT, CLT  Acute Rehabilitation Services Office: [phone] (Secure chat preferred)    Claudia Desanctis 02/17/2023, 2:52 PM

## 2023-02-17 NOTE — Progress Notes (Signed)
PROGRESS NOTE  Mathew Wyatt ZOX:[id number] DOB: Jun 06, 1960 DOA: 02/08/2023 PCP: Patient, No Pcp Per   LOS: 9 days   Brief narrative:  Mathew Wyatt is a 63 y.o. male with past medical history of COPD and a smoker who had initially presented to [facility] hospital on 02/07/2023 with shortness of breath dyspnea on exertion with productive cough.  He was noted to be tachypneic with significant leukocytosis and elevated lactate.  Patient was started on IV antibiotic.  CT scan of the chest however was concerning for empyema versus abscess so he was transferred to [facility] on 02/08/2023 for further evaluation.  Patient however went into A-fib with RVR and hypotension and was admitted to ICU service.   1/21: TCTS defer VATS. ICU for afib rvr hypotension, started on amiodarone. CT placed with immediate 2L purulent output. Initial tube lytics with additional 300cc output. ECHO neg for vegetation, EF 45-50% 1/22: repeat CT chest 1/23 new chest tube placed by IR, cultures growing Campylobacter and Peptostreptococcus 1/26 dec'd pleural output but has persistent loculated fluid collection  1/27 AF w/ RVR. Treated w/ BB. Seen by ID; vanc stopped, switched to ceftriaxone, flagyl and azith for campylobacter. CT chest obtained. The anterior fluid collection has improved. The lateral effusion vol is less but loculated air maintains. Now has Right layering effusion, and new patchy areas of GG changes. Some mild dec in left GG changes. Repeated round 4 pleural lytics (administered in right lateral tube)  1/28: lateral CT 120 net neg  1/29 no significant change after 5 doses of pleural lytic therapy.    Assessment/Plan:  Sepsis secondary to left empyema, pneumonia Acute hypoxic respiratory failure -Status post chest tube placement x2.  Second chest tube placement by IR 02/10/2023.  -On broad-spectrum antibiotics initially changed to ceftriaxone, Flagyl and azithromycin  -Orthopantogram with dental caries,  unfortunately no inpatient dental coverage available now, would need outpatient follow-up -Pleural fluid cultures initially grew Campylobacter and Peptostreptococcus  -Repeat cultures with Streptococcus constellatus, pansensitive -CT surgery, pulmonary and ID following.    -Has completed 7 doses of fibrinolytic therapy without considerable improvement, T CTS consulting -Holding anticoagulation   New onset atrial fibrillation with RVR.   -Was on amiodarone drip, then changed to p.o., back on amiodarone gtt. again, switched back to oral amiodarone,, continue metoprolol -Hold heparin with empyema and ongoing fibrinolytic therapy  Acute systolic congestive heart failure with moderately reduced ejection fraction.  2D echocardiogram 45 to 50% with global hypokinesis. 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  Continue nebulizers.   insomnia anxiety.  On muscle relaxant Atarax Ambien and pain medication   DVT prophylaxis: Place and maintain sequential compression device Start: 02/14/23 1900 SCDs Start: 02/08/23 0845 SCDs Start: 02/08/23 0305   Disposition: Home pending resolution of empyema    Code Status:     Code Status: Full Code  Family Communication: None at bedside  Consultants: CT surgery PCCM Interventional radiology Cardiology  Procedures: Left-sided chest tube placement x 2. Fibrinolytic treatment x 3  Anti-infectives:  Rocephin Flagyl and Zithromax.  Anti-infectives (From admission, onward)    Start     Dose/Rate Route Frequency Ordered Stop   02/17/23 2000  Ampicillin-Sulbactam (UNASYN) 3 g in sodium chloride 0.9 % 100 mL IVPB        3 g 200 mL/hr over 30 Minutes Intravenous Every 6 hours 02/17/23 0917     02/14/23 2200  metroNIDAZOLE (FLAGYL) tablet 500 mg  Status:  Discontinued        500 mg Oral Every 12 hours 02/14/23 1412 02/17/23  0917   02/14/23 2000  cefTRIAXone (ROCEPHIN) 2 g in sodium chloride 0.9 % 100 mL IVPB  Status:  Discontinued        2 g 200 mL/hr over 30 Minutes Intravenous Every 24 hours 02/14/23 1410 02/17/23 0917   02/14/23 1500  azithromycin (ZITHROMAX) tablet 500 mg        500 mg Oral Daily 02/14/23 1410     02/12/23 0200  vancomycin (VANCOREADY) IVPB 1500 mg/300 mL  Status:  Discontinued        1,500 mg 150 mL/hr over 120 Minutes Intravenous Every 12 hours 02/11/23 1301 02/14/23 1410   02/11/23 1430  vancomycin (VANCOCIN) 750 mg in sodium chloride 0.9 % 250 mL IVPB  Status:  Discontinued        750 mg 265 mL/hr over 60 Minutes Intravenous  Once 02/11/23 1309 02/11/23 1354   02/11/23 1430  vancomycin (VANCOCIN) 750 mg in sodium chloride 0.9 % 250 mL IVPB        750 mg 250 mL/hr over 60 Minutes Intravenous  Once 02/11/23 1354 02/11/23 1543   02/11/23 1400  vancomycin (VANCOREADY) IVPB 750 mg/150 mL  Status:  Discontinued        750 mg 150 mL/hr over 60 Minutes Intravenous  Once 02/11/23 1301 02/11/23 1309   02/10/23 2130  metroNIDAZOLE (FLAGYL) IVPB 500 mg  Status:  Discontinued        500 mg 100 mL/hr over 60 Minutes Intravenous Every 12 hours 02/10/23 1434 02/14/23 1410   02/08/23 1730  metroNIDAZOLE (FLAGYL) IVPB 500 mg  Status:  Discontinued        500 mg 100 mL/hr over 60 Minutes Intravenous Every 8 hours 02/08/23 1637 02/10/23 1434   02/08/23 0400  vancomycin (VANCOREADY) IVPB 750 mg/150 mL  Status:  Discontinued        750 mg 150 mL/hr over 60 Minutes Intravenous Every 12 hours 02/08/23 0324 02/08/23 0328   02/08/23 0400  ceFEPIme (MAXIPIME) 2 g in sodium chloride 0.9 % 100 mL IVPB  Status:  Discontinued        2 g 200 mL/hr over 30 Minutes Intravenous Every 8 hours 02/08/23 0324 02/14/23 1410   02/08/23 0400  vancomycin (VANCOCIN) 750 mg in sodium chloride 0.9 % 250 mL IVPB  Status:  Discontinued        750 mg 250 mL/hr over 60 Minutes Intravenous Every 12 hours 02/08/23 0328 02/11/23  1258       Subjective: Feels fair, ambulated with physical therapy yesterday, denies significant symptoms  Objective: Vitals:   02/17/23 0253 02/17/23 0812  BP: 113/70 111/62  Pulse: 91   Resp: 17   Temp: 97.9 F (36.6 C) 97.8 F (36.6 C)  SpO2: 90%     Intake/Output Summary (Last 24 hours) at 02/17/2023 1113 Last data filed at 02/17/2023 0957 Gross per 24 hour  Intake 855.97 ml  Output 800 ml  Net 55.97 ml   Filed Weights   02/15/23 0424 02/16/23 0400 02/17/23 0253  Weight: 83.4 kg 80.4 kg 75.7 kg   Body mass index is 22.63 kg/m.   Physical Exam:  Gen: Awake, Alert, Oriented X 3,  HEENT: no JVD, poor dentition CVS: S1-S2, regular rhythm Lungs: Rhonchi on the left, right side is clear, chest tubes noted Abd: soft, Non tender, non distended, BS present Extremities: No  edema Skin: no new rashes on exposed skin   Data Review: I have personally reviewed the following laboratory data and studies,  CBC: Recent Labs  Lab 02/12/23 0214 02/14/23 0217 02/15/23 0440 02/16/23 0225 02/17/23 0340  WBC 11.3* 11.5* 12.0* 10.4 12.9*  HGB 10.2* 10.6* 9.6* 9.4* 9.6*  HCT 31.4* 32.8* 29.8* 29.3* 30.1*  MCV 92.4 92.7 92.5 93.0 93.2  PLT 237 236 225 256 277   Basic Metabolic Panel: Recent Labs  Lab 02/13/23 0204 02/14/23 0217 02/15/23 0440 02/16/23 0225 02/17/23 0848  NA 134* 132* 134* 130* 134*  K 3.7 3.9 4.3 4.0 3.8  CL 104 103 105 99 104  CO2 24 20* 23 21* 23  GLUCOSE 158* 109* 121* 131* 105*  BUN 9 5* 9 11 11   CREATININE 0.65 0.68 0.68 0.67 0.68  CALCIUM 7.5* 7.7* 8.0* 7.9* 7.8*  MG 1.9 1.8 1.9 2.0 1.7  PHOS  --  2.7  --  2.9  --    Liver Function Tests: No results for input(s): "AST", "ALT", "ALKPHOS", "BILITOT", "PROT", "ALBUMIN" in the last 168 hours.  No results for input(s): "LIPASE", "AMYLASE" in the last 168 hours.  No results for input(s): "AMMONIA" in the last 168 hours. Cardiac Enzymes: No results for input(s): "CKTOTAL", "CKMB",  "CKMBINDEX", "TROPONINI" in the last 168 hours. BNP (last 3 results) No results for input(s): "BNP" in the last 8760 hours.  ProBNP (last 3 results) No results for input(s): "PROBNP" in the last 8760 hours.  CBG: No results for input(s): "GLUCAP" in the last 168 hours. Recent Results (from the past 240 hours)  Culture, blood (single)     Status: None   Collection Time: 02/07/23  5:31 PM   Specimen: BLOOD  Result Value Ref Range Status   Specimen Description BLOOD BLOOD LEFT ARM  Final   Special Requests   Final    BOTTLES DRAWN AEROBIC AND ANAEROBIC Blood Culture results may not be optimal due to an inadequate volume of blood received in culture bottles   Culture   Final    NO GROWTH 5 DAYS Performed at [facility], [address] [place], [place] [address]    Report Status 02/12/2023 FINAL  Final  MRSA Next Gen by PCR, Nasal     Status: Abnormal   Collection Time: 02/08/23  9:34 AM   Specimen: Nasal Mucosa; Nasal Swab  Result Value Ref Range Status   MRSA by PCR Next Gen DETECTED (A) NOT DETECTED Final    Comment: RESULT CALLED TO, READ BACK BY AND VERIFIED WITH: RN christine h. [phone] fcp (NOTE) The GeneXpert MRSA Assay (FDA approved for NASAL specimens only), is one component of a comprehensive MRSA colonization surveillance program. It is not intended to diagnose MRSA infection nor to guide or monitor treatment for MRSA infections. Test performance is not FDA approved in patients less than 5 years old. Performed at [facility], [address], [place], [place] [address]   Culture, blood (Routine X 2) w Reflex to ID Panel     Status: None   Collection Time: 02/08/23  9:46 AM   Specimen: BLOOD LEFT HAND  Result Value Ref Range Status   Specimen Description BLOOD LEFT HAND  Final   Special Requests   Final    BOTTLES DRAWN AEROBIC AND ANAEROBIC Blood Culture results may not be optimal due to an inadequate volume of blood received in culture  bottles   Culture   Final    NO  GROWTH 5 DAYS Performed at [facility], [address] [place], [place] [address]    Report Status 02/13/2023 FINAL  Final  Culture, blood (Routine X 2) w Reflex to ID Panel     Status: None   Collection Time: 02/08/23  9:54 AM   Specimen: BLOOD LEFT HAND  Result Value Ref Range Status   Specimen Description BLOOD LEFT HAND  Final   Special Requests   Final    BOTTLES DRAWN AEROBIC AND ANAEROBIC Blood Culture results may not be optimal due to an inadequate volume of blood received in culture bottles   Culture   Final    NO GROWTH 5 DAYS Performed at [facility], [address], [place], [place] [address]    Report Status 02/13/2023 FINAL  Final  Body fluid culture w Gram Stain     Status: None   Collection Time: 02/08/23 11:04 AM   Specimen: Pleural Fluid  Result Value Ref Range Status   Specimen Description PLEURAL  Final   Special Requests NONE  Final   Gram Stain   Final    ABUNDANT WBC PRESENT, PREDOMINANTLY PMN ABUNDANT GRAM NEGATIVE RODS ABUNDANT GRAM POSITIVE COCCI IN PAIRS IN CHAINS Gram Stain Report Called to,Read Back By and Verified With: C. HICKLING [id number] @ 1519 FH    Culture   Final    MODERATE CAMPYLOBACTER SPECIES MODERATE PEPTOSTREPTOCOCCUS MICROS WITHIN MIXED ANAEROBIC ORGANISMS Performed at [facility], [address], [place], [place] [address]    Report Status 02/12/2023 FINAL  Final  Fungus Culture With Stain     Status: None (Preliminary result)   Collection Time: 02/08/23 11:04 AM   Specimen: Pleural Fluid  Result Value Ref Range Status   Fungus Stain Final report  Final    Comment: (NOTE) Performed At: [facility] [address] [place], [place] [id number] Jolene Schimke MD [id number]    Fungus (Mycology) Culture PENDING  Incomplete   Fungal Source PLEURAL  Final    Comment: Performed at [facility], [address] [place], [place] [address]  Fungus Culture Result      Status: None   Collection Time: 02/08/23 11:04 AM  Result Value Ref Range Status   Result 1 Comment  Final    Comment: (NOTE) KOH/Calcofluor preparation:  no fungus observed. Performed At: [facility] [address] [place], [place] [id number] Jolene Schimke MD QQ:[id number]   Aerobic/Anaerobic Culture w Gram Stain (surgical/deep wound)     Status: Abnormal   Collection Time: 02/10/23  9:23 AM   Specimen: Abscess  Result Value Ref Range Status   Specimen Description ABSCESS  Final   Special Requests chest  Final   Gram Stain   Final    ABUNDANT WBC PRESENT, PREDOMINANTLY PMN ABUNDANT GRAM POSITIVE COCCI RARE GRAM POSITIVE RODS    Culture (A)  Final    STREPTOCOCCUS CONSTELLATUS NO ANAEROBES ISOLATED Performed at [facility], [address], [place], [place] [address]    Report Status 02/16/2023 FINAL  Final   Organism ID, Bacteria STREPTOCOCCUS CONSTELLATUS  Final      Susceptibility   Streptococcus constellatus - MIC*    PENICILLIN <=0.06 SENSITIVE Sensitive     CEFTRIAXONE <=0.12 SENSITIVE Sensitive     ERYTHROMYCIN <=0.12 SENSITIVE Sensitive     LEVOFLOXACIN <=0.25 SENSITIVE Sensitive     VANCOMYCIN 0.5 SENSITIVE Sensitive     * STREPTOCOCCUS CONSTELLATUS     Studies: DG Chest [facility]  1 View Result Date: 02/16/2023 CLINICAL DATA:  [id number] Pleural effusion [id number] EXAM: PORTABLE CHEST 1 VIEW COMPARISON:  02/15/2023 FINDINGS: Left PICC line remains in place. Two pigtail pleural drainage catheters remain in place on the left. This small left basilar pneumothorax, slightly increased in size with pleural edge measuring 1.5 cm to the lateral chest wall (previously approximately 0.8 cm). Persistent peripheral opacity in the left lung. No right-sided pneumothorax. Stable cardiomegaly. IMPRESSION: Small left basilar pneumothorax, slightly increased in size. Two pigtail pleural drainage catheters remain in place on the left. Electronically Signed   By: Duanne Guess D.O.    On: 02/16/2023 09:19      Zannie Cove, MD  [facility] 02/17/2023  If 7PM-7AM, please contact night-coverage

## 2023-02-17 NOTE — Progress Notes (Signed)
NAME:  Mathew Wyatt, MRN:  [id number], DOB:  1960-08-08, LOS: 9 ADMISSION DATE:  02/08/2023, CONSULTATION DATE: 02/08/2023  REFERRING MD: Add, CHIEF COMPLAINT: Hypotension, atrial fibrillation, large empyema on left with hydropneumothorax  History of Present Illness:  63 year old male with past medical history of tobacco use, COPD who presented to [facility] on 1/20 with cough and shortness of breath. Reportedly having worsening symptoms over the preceding 2 weeks. He denied having fever, chills, chest pain, n/v/d. He was found to be tachypneic with significant leukocytosis to 35.7, lactifc 4.4. he was started on IV antibiotics. He had CT chest which demonstrated a large left-sided loculated hydropneumothorax concerning for empyema or abscess. He was transferred to [facility] on 1/21 initially to hospitalist for CT surgery consult and possible VATS. On 1/21 he developed atrial fibrillation with RVR and hypotension and was transferred to [facility] and admitted to ICU.   Pertinent  Medical History  History reviewed. No pertinent past medical history.   Significant Hospital Events: Including procedures, antibiotic start and stop dates in addition to other pertinent events   1/20: [facility] ED with SOB. CT with large left hydropneumo c/w empyema vs abscess.  1/21: TCTS defer VATS. ICU for afib rvr hypotension, started on amiodarone. CT placed with immediate 2L purulent output. Initial tube lytics with additional 300cc output. ECHO neg for vegetation, EF 45-50% 1/22: repeat CT chest today to eval for need of additional CT vs more lytics vs observation.  1/23 new chest tube placed by IR, cultures growing Campylobacter and Peptostreptococcus 1/26 dec'd pleural output but has persistent loculated fluid collection  1/27 AF w/ RVR. Treated w/ BB. Seen by ID; vanc stopped, switched to ceftriaxone, flagyl and azith for campylobacter. CT chest obtained. The anterior fluid collection has improved. The lateral effusion vol is  less but loculated air maintains. Now has Right layering effusion, and new patchy areas of GG changes. Some mild dec in left GG changes. Repeated round 4 pleural lytics (administered in right lateral tube)  1/28: lateral CT 120 net neg after accounting for TPA. AM cxr possible marginal Improvement in loculated ptx. 1/29 no significant change after 5 doses of pleural lytic therapy.  Discussed case again with thoracic surgery who will reevaluate for possible VATS.  Lateral chest tube became dislodged, removed. 1/30 s/p 7 doses of pleural lytics. ~150 mL output from chest tube overnight.  Interim History / Subjective:  AM CXR pending.  Awaiting thoracic surgery input.  Chest tube output continues to appear grossly exudative, dark brown sediment.  Objective   Blood pressure 113/70, pulse 91, temperature 97.9 F (36.6 C), temperature source Oral, resp. rate 17, height 6' (1.829 m), weight 75.7 kg, SpO2 90%.        Intake/Output Summary (Last 24 hours) at 02/17/2023 0753 Last data filed at 02/17/2023 0443 Gross per 24 hour  Intake 855.97 ml  Output 800 ml  Net 55.97 ml   Filed Weights   02/15/23 0424 02/16/23 0400 02/17/23 0253  Weight: 83.4 kg 80.4 kg 75.7 kg    Examination:  General this is a 63 year old male He is laying in bed. No distress.  HENT poor dentition, no JVD.  Mucous membranes are moist Pulmonary: Rhonchorous on the left side, right side is clear, remains on room air.  Bilateral chest tubes remain in place to the anterior tube has not drained anything, the lateral tube has purulent output drainage mentioned below there is no airleak noted in the chest tube airleak indicator Portable  chest x-ray was personally reviewed.  He continues to have marked disease involving the left pleural space, the lateral loculated trapped air remains unchanged Cardiac: Regular rate and rhythm remains in sinus today Abdomen: Soft nontender no organomegaly Extremities: Warm dry with brisk capillary  refill Neuro: Awake oriented no focal deficits appreciated.   Resolved problem list   Sepsis  Af w/ RVR Assessment & Plan:  LEFT EMPYEMA - Campylobacter and Peptostreptococcus Pneumothorax due to lung entrapment, lack of lung expansion: pneumothorax ex vacuo. Multiple dental caries w/ possible dental abscess on tooth 19 and 20 Afib. Now back in NSR 1/28 Systolic HF (HFmrEF) Tobacco abuse Insomnia Pain   Pulm prob list   LEFT EMPYEMA - Campylobacter and Peptostreptococcus Pneumothorax due to lung entrapment, lack of lung expansion Poor dentition and possible dental abscess tooth 19 and 20 1/21 CT placed with 2L out.  1/23 New 2nd anterior chest tube placed by IR under CT guidance 1/29 Lateral chest tube (#1) removed.  Discussion Still obviously exudative drainage, trivial radiographic improvement.  Still with marked disease in the left pleural space, and area of trapped lung.  Awaiting thoracic surgery input.  Hoping they will reconsider him for surgery. most recent cultures (1/23) still isolating Streptococcus constellatus increased CT to 40 sxn on 1/26  now s/p 7 doses of pleural lytics.  Previously, the anterior chest tube was felt to have only modest output with significantly more coming from the lateral chest tube, raising doubts about lack of communication (persistent loculation).  But, the lateral chest tube inadvertently fell out on 1/29.   Plan Abx as directed by ID. Repeat pleural fluid culture.  Caution against premature removal of chest tube. PRN pain rx CXR in am. He is going to need dental extraction of at least tooth #19 and 20.  Not sure that they would do this in-house.  Could consider. Will defer to primary team  Hold the systemic AC until we are done w/ pleural lytic interventions  Surgical reconsultation is pending.   Best Practice (right click and "Reselect all SmartList Selections" daily)   Per primary  Marcelle Smiling, MD Board Certified by the  ABIM, Pulmonary Diseases & Critical Care Medicine

## 2023-02-17 NOTE — Progress Notes (Signed)
     [address]       [place] [address]             [phone]       Discussed case with patient.  He denies any shortness of breath and off oxygen.  He does not want to proceed with with surgery.  Please call with questions  Corliss Skains

## 2023-02-17 NOTE — Progress Notes (Signed)
Cardiology Rounding Note  Cardiologist: Bjorn Pippin  Patient Profile   63 yo male with COPD and ongoing tobacco use. No h/o heart disease. Admitted with AF with RVR in setting of sepsis with massive left hydro/pneumo thorax. S/p emergent left chest tube with ongoing pleural lytic therapy.   Subjective:    No complaints today. Feeling well. Wants to go home  Objective:   Weight Range: 80.4 kg Body mass index is 24.04 kg/m.   Vital Signs:   Temp:  [97.5 F (36.4 C)-98.2 F (36.8 C)] 97.9 F (36.6 C) (01/30 0253) Pulse Rate:  [83-117] 91 (01/30 0253) Resp:  [15-20] 17 (01/30 0253) BP: (95-130)/(65-75) 113/70 (01/30 0253) SpO2:  [90 %-97 %] 90 % (01/30 0253) Last BM Date : 02/16/23  Weight change: Filed Weights   02/14/23 0500 02/15/23 0424 02/16/23 0400  Weight: 78.5 kg 83.4 kg 80.4 kg    Intake/Output:   Intake/Output Summary (Last 24 hours) at 02/17/2023 0611 Last data filed at 02/17/2023 0443 Gross per 24 hour  Intake 855.97 ml  Output 850 ml  Net 5.97 ml      Physical Exam   General: Well developed, well nourished, NAD  Psychiatric: Mood and affect normal  Neck: No JVD Lungs:Coarse sounds ight lung Cardiovascular: RRR Abdomen:Soft.  Extremities: No lower extremity edema.  Telemetry   Sinus  personally reviewed   EKG    N/A   Labs    CBC Recent Labs    02/16/23 0225 02/17/23 0340  WBC 10.4 12.9*  HGB 9.4* 9.6*  HCT 29.3* 30.1*  MCV 93.0 93.2  PLT 256 277   Basic Metabolic Panel Recent Labs    60/45/40 0440 02/16/23 0225  NA 134* 130*  K 4.3 4.0  CL 105 99  CO2 23 21*  GLUCOSE 121* 131*  BUN 9 11  CREATININE 0.68 0.67  CALCIUM 8.0* 7.9*  MG 1.9 2.0  PHOS  --  2.9   Liver Function Tests No results for input(s): "AST", "ALT", "ALKPHOS", "BILITOT", "PROT", "ALBUMIN" in the last 72 hours.  No results for input(s): "LIPASE", "AMYLASE" in the last 72 hours.  Cardiac Enzymes No results for input(s): "CKTOTAL", "CKMB",  "CKMBINDEX", "TROPONINI" in the last 72 hours.  BNP: BNP (last 3 results) No results for input(s): "BNP" in the last 8760 hours.  ProBNP (last 3 results) No results for input(s): "PROBNP" in the last 8760 hours.   D-Dimer No results for input(s): "DDIMER" in the last 72 hours. Hemoglobin A1C No results for input(s): "HGBA1C" in the last 72 hours. Fasting Lipid Panel No results for input(s): "CHOL", "HDL", "LDLCALC", "TRIG", "CHOLHDL", "LDLDIRECT" in the last 72 hours. Thyroid Function Tests No results for input(s): "TSH", "T4TOTAL", "T3FREE", "THYROIDAB" in the last 72 hours.  Invalid input(s): "FREET3"  Other results:   Imaging     Medications:     Scheduled Medications:  azithromycin  500 mg Oral Daily   Chlorhexidine Gluconate Cloth  6 each Topical Q0600   Gerhardt's butt cream   Topical TID   lactulose  20 g Oral BID   lidocaine  5 mL Intradermal Once   methocarbamol (ROBAXIN) injection  500 mg Intravenous Q8H   Or   methocarbamol  500 mg Oral Q8H   metoprolol tartrate  12.5 mg Oral BID   metroNIDAZOLE  500 mg Oral Q12H   mupirocin ointment   Nasal BID   nicotine  21 mg Transdermal Daily   oxyCODONE  10 mg Oral Q6H  pantoprazole  40 mg Oral Daily   polyethylene glycol  17 g Oral Daily   sodium chloride flush  10 mL Intrapleural Q8H   sodium chloride flush  10-40 mL Intracatheter Q12H   spironolactone  12.5 mg Oral Daily    Infusions:  amiodarone 30 mg/hr (02/17/23 0443)   cefTRIAXone (ROCEPHIN)  IV Stopped (02/16/23 2100)    PRN Medications: acetaminophen **OR** acetaminophen, docusate sodium, HYDROmorphone (DILAUDID) injection, hydrOXYzine, LORazepam, metoprolol tartrate, ondansetron **OR** ondansetron (ZOFRAN) IV, mouth rinse, sodium chloride flush, zolpidem   Assessment/Plan   1. New onset AF with RVR: He converted back to atrial fib yesterday when IV amiodarone was stopped. Atrial fibrillation is driven by his acute pulmonary illness. Sinus  today. Would continue IV amiodarone and IV heparin for now. Can change to a DOAC when all procedures are completed.    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 Will repeat echo in several weeks to re-evaluate LV function.   Verne Carrow, MD , [facility] 02/17/2023, 6:11 AM

## 2023-02-18 ENCOUNTER — Inpatient Hospital Stay (HOSPITAL_COMMUNITY): Payer: Medicaid Other

## 2023-02-18 LAB — BASIC METABOLIC PANEL
Anion gap: 3 — ABNORMAL LOW (ref 5–15)
BUN: 8 mg/dL (ref 8–23)
CO2: 21 mmol/L — ABNORMAL LOW (ref 22–32)
Calcium: 6.6 mg/dL — ABNORMAL LOW (ref 8.9–10.3)
Chloride: 110 mmol/L (ref 98–111)
Creatinine, Ser: 0.46 mg/dL — ABNORMAL LOW (ref 0.61–1.24)
GFR, Estimated: >60 mL/min (ref >60)
Glucose, Bld: 120 mg/dL — ABNORMAL HIGH (ref 70–99)
Potassium: 3 mmol/L — ABNORMAL LOW (ref 3.5–5.1)
Sodium: 134 mmol/L — ABNORMAL LOW (ref 135–145)

## 2023-02-18 LAB — CBC
HCT: 24.1 % — ABNORMAL LOW (ref 39.0–52.0)
HCT: 27.2 % — ABNORMAL LOW (ref 39.0–52.0)
Hemoglobin: 7.8 g/dL — ABNORMAL LOW (ref 13.0–17.0)
Hemoglobin: 8.8 g/dL — ABNORMAL LOW (ref 13.0–17.0)
MCH: 30.5 pg (ref 26.0–34.0)
MCH: 30.3 pg (ref 26.0–34.0)
MCHC: 32.4 g/dL (ref 30.0–36.0)
MCHC: 32.4 g/dL (ref 30.0–36.0)
MCV: 94.1 fL (ref 80.0–100.0)
MCV: 93.8 fL (ref 80.0–100.0)
Platelets: 264 10*3/uL (ref 150–400)
Platelets: 327 10*3/uL (ref 150–400)
RBC: 2.56 MIL/uL — ABNORMAL LOW (ref 4.22–5.81)
RBC: 2.9 MIL/uL — ABNORMAL LOW (ref 4.22–5.81)
RDW: 17 % — ABNORMAL HIGH (ref 11.5–15.5)
RDW: 17.2 % — ABNORMAL HIGH (ref 11.5–15.5)
WBC: 9.6 10*3/uL (ref 4.0–10.5)
WBC: 11.8 10*3/uL — ABNORMAL HIGH (ref 4.0–10.5)
nRBC: 0 % (ref 0.0–0.2)
nRBC: 0 % (ref 0.0–0.2)

## 2023-02-18 MED ORDER — POTASSIUM CHLORIDE CRYS ER 20 MEQ PO TBCR
40.0000 meq | EXTENDED_RELEASE_TABLET | Freq: Two times a day (BID) | ORAL | Status: AC
  Administered 2023-02-18 (×2): 40 meq via ORAL
  Filled 2023-02-18 (×2): qty 2

## 2023-02-18 NOTE — Progress Notes (Addendum)
PROGRESS NOTE  Mathew Wyatt ZOX:[id number] DOB: [birth year] DOA: 02/08/2023 PCP: Patient, No Pcp Per   LOS: 10 days   Brief narrative:  Mathew Wyatt is a 63 y.o. male with past medical history of COPD and a smoker who had initially presented to [facility] hospital on 02/07/2023 with shortness of breath dyspnea on exertion with productive cough.  He was noted to be tachypneic with significant leukocytosis and elevated lactate.  Patient was started on IV antibiotic.  CT scan of the chest however was concerning for empyema versus abscess so he was transferred to [facility] on 02/08/2023 for further evaluation.  Patient however went into A-fib with RVR and hypotension and was admitted to ICU service.   1/21: TCTS defer VATS. ICU for afib rvr hypotension, started on amiodarone. CT placed with immediate 2L purulent output. Initial tube lytics with additional 300cc output. ECHO neg for vegetation, EF 45-50% 1/22: repeat CT chest 1/23 new chest tube placed by IR, cultures growing Campylobacter and Peptostreptococcus 1/26 dec'd pleural output but has persistent loculated fluid collection  1/27 AF w/ RVR. Treated w/ BB. Seen by ID; vanc stopped, switched to ceftriaxone, flagyl and azith for campylobacter. CT chest obtained. The anterior fluid collection has improved. The lateral effusion vol is less but loculated air maintains. Now has Right layering effusion, and new patchy areas of GG changes. Some mild dec in left GG changes. Repeated round 4 pleural lytics (administered in right lateral tube)  1/28: lateral CT 120 net neg  1/30 no significant change after 7 doses of pleural lytic therapy.  -1/30, seen by Dr. Cliffton Asters, patient refused VATS  Subjective: -Feels okay, wants to go home, frustrated, this we discussed lack of improvement and reason VATS/decortication was important for definitive management  Assessment/Plan:  Sepsis secondary to left empyema, pneumonia Acute hypoxic respiratory  failure -Status post chest tube placement x2.  Second chest tube placement by IR 02/10/2023.  -Was on ceftriaxone, Flagyl and azithromycin now on IV Unasyn Day 2 -Orthopantogram with dental caries, unfortunately no inpatient dental coverage available now, would need outpatient follow-up -Pleural fluid cultures initially grew Campylobacter and Peptostreptococcus  -Repeat cultures with Streptococcus constellatus, pansensitive -CT surgery, pulmonary and ID following.    -Has completed 7 doses of fibrinolytic therapy without considerable improvement, T CTS consulted yesterday, seen by Dr. Cliffton Asters patient refused VATS/decortication, now willing to reconsider, will notify T CTS -Holding anticoagulation   New onset atrial fibrillation with RVR.   -Was on amiodarone drip, then changed to p.o., back on amiodarone gtt. again, continue metoprolol, RVR likely driven by empyema -Hold heparin with empyema and ongoing fibrinolytic therapy  Acute on chronic anemia Hemoglobin has trended down to 7.8 this morning, no overt bleeding reported, heparin has been on hold X 3 days, repeat CBC this afternoon and monitor  Acute systolic congestive heart failure with moderately reduced ejection fraction.  2D echocardiogram 45 to 50% with global hypokinesis. 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  Continue nebulizers.   insomnia anxiety.  On muscle relaxant Atarax Ambien and pain medication   DVT prophylaxis: SCDs  disposition: Home pending resolution of empyema    Code Status:    Code Status: Full Code  Family Communication: None at bedside  Consultants: CT surgery PCCM Interventional radiology Cardiology  Procedures: Left-sided chest tube placement x 2. Fibrinolytic treatment x 3  Anti-infectives:  Rocephin Flagyl and Zithromax.  Anti-infectives (From admission,  onward)    Start     Dose/Rate Route Frequency Ordered Stop   02/17/23 2000  Ampicillin-Sulbactam (UNASYN) 3 g in sodium chloride 0.9 % 100 mL IVPB        3 g 200 mL/hr over 30 Minutes Intravenous Every 6 hours 02/17/23 0917     02/14/23 2200  metroNIDAZOLE (FLAGYL) tablet 500 mg  Status:  Discontinued        500 mg Oral Every 12 hours 02/14/23 1412 02/17/23 0917   02/14/23 2000  cefTRIAXone (ROCEPHIN) 2 g in sodium chloride 0.9 % 100 mL IVPB  Status:  Discontinued        2 g 200 mL/hr over 30 Minutes Intravenous Every 24 hours 02/14/23 1410 02/17/23 0917   02/14/23 1500  azithromycin (ZITHROMAX) tablet 500 mg        500 mg Oral Daily 02/14/23 1410     02/12/23 0200  vancomycin (VANCOREADY) IVPB 1500 mg/300 mL  Status:  Discontinued        1,500 mg 150 mL/hr over 120 Minutes Intravenous Every 12 hours 02/11/23 1301 02/14/23 1410   02/11/23 1430  vancomycin (VANCOCIN) 750 mg in sodium chloride 0.9 % 250 mL IVPB  Status:  Discontinued        750 mg 265 mL/hr over 60 Minutes Intravenous  Once 02/11/23 1309 02/11/23 1354   02/11/23 1430  vancomycin (VANCOCIN) 750 mg in sodium chloride 0.9 % 250 mL IVPB        750 mg 250 mL/hr over 60 Minutes Intravenous  Once 02/11/23 1354 02/11/23 1543   02/11/23 1400  vancomycin (VANCOREADY) IVPB 750 mg/150 mL  Status:  Discontinued        750 mg 150 mL/hr over 60 Minutes Intravenous  Once 02/11/23 1301 02/11/23 1309   02/10/23 2130  metroNIDAZOLE (FLAGYL) IVPB 500 mg  Status:  Discontinued        500 mg 100 mL/hr over 60 Minutes Intravenous Every 12 hours 02/10/23 1434 02/14/23 1410   02/08/23 1730  metroNIDAZOLE (FLAGYL) IVPB 500 mg  Status:  Discontinued        500 mg 100 mL/hr over 60 Minutes Intravenous Every 8 hours 02/08/23 1637 02/10/23 1434   02/08/23 0400  vancomycin (VANCOREADY) IVPB 750 mg/150 mL  Status:  Discontinued        750 mg 150 mL/hr over 60 Minutes Intravenous Every 12 hours 02/08/23 0324 02/08/23 0328   02/08/23 0400  ceFEPIme  (MAXIPIME) 2 g in sodium chloride 0.9 % 100 mL IVPB  Status:  Discontinued        2 g 200 mL/hr over 30 Minutes Intravenous Every 8 hours 02/08/23 0324 02/14/23 1410   02/08/23 0400  vancomycin (VANCOCIN) 750 mg in sodium chloride 0.9 % 250 mL IVPB  Status:  Discontinued        750 mg 250 mL/hr over 60 Minutes Intravenous Every 12 hours 02/08/23 0328 02/11/23 1258       Objective: Vitals:   02/18/23 0349 02/18/23 0700  BP: (!) 104/57 124/71  Pulse: 93 92  Resp: 18 18  Temp:  98.3 F (36.8 C)  SpO2: 92% 91%    Intake/Output Summary (Last 24 hours) at 02/18/2023 0943 Last data filed at 02/18/2023 0400 Gross per 24 hour  Intake 620.19 ml  Output 1000 ml  Net -379.81 ml   Filed Weights   02/16/23 0400 02/17/23 0253 02/18/23 0345  Weight: 80.4 kg 75.7 kg 73.8 kg   Body mass  index is 22.07 kg/m.   Physical Exam:  Gen: AAOx3 no distress HEENT: No JVD, poor dentition, CVS: S1-S2, regular rhythm Lungs: Few rhonchi on the left, chest tubes noted awake CVS: S1-S2, irregular rhythm Abd: soft, Non tender, non distended, BS present Extremities: No edema Skin: no new rashes on exposed skin   Data Review: I have personally reviewed the following laboratory data and studies,  CBC: Recent Labs  Lab 02/14/23 0217 02/15/23 0440 02/16/23 0225 02/17/23 0340 02/18/23 0401  WBC 11.5* 12.0* 10.4 12.9* 9.6  HGB 10.6* 9.6* 9.4* 9.6* 7.8*  HCT 32.8* 29.8* 29.3* 30.1* 24.1*  MCV 92.7 92.5 93.0 93.2 94.1  PLT 236 225 256 277 264   Basic Metabolic Panel: Recent Labs  Lab 02/13/23 0204 02/14/23 0217 02/15/23 0440 02/16/23 0225 02/17/23 0848 02/18/23 0401  NA 134* 132* 134* 130* 134* 134*  K 3.7 3.9 4.3 4.0 3.8 3.0*  CL 104 103 105 99 104 110  CO2 24 20* 23 21* 23 21*  GLUCOSE 158* 109* 121* 131* 105* 120*  BUN 9 5* 9 11 11 8   CREATININE 0.65 0.68 0.68 0.67 0.68 0.46*  CALCIUM 7.5* 7.7* 8.0* 7.9* 7.8* 6.6*  MG 1.9 1.8 1.9 2.0 1.7  --   PHOS  --  2.7  --  2.9  --   --     Liver Function Tests: No results for input(s): "AST", "ALT", "ALKPHOS", "BILITOT", "PROT", "ALBUMIN" in the last 168 hours.  No results for input(s): "LIPASE", "AMYLASE" in the last 168 hours.  No results for input(s): "AMMONIA" in the last 168 hours. Cardiac Enzymes: No results for input(s): "CKTOTAL", "CKMB", "CKMBINDEX", "TROPONINI" in the last 168 hours. BNP (last 3 results) No results for input(s): "BNP" in the last 8760 hours.  ProBNP (last 3 results) No results for input(s): "PROBNP" in the last 8760 hours.  CBG: No results for input(s): "GLUCAP" in the last 168 hours. Recent Results (from the past 240 hours)  Culture, blood (Routine X 2) w Reflex to ID Panel     Status: None   Collection Time: 02/08/23  9:46 AM   Specimen: BLOOD LEFT HAND  Result Value Ref Range Status   Specimen Description BLOOD LEFT HAND  Final   Special Requests   Final    BOTTLES DRAWN AEROBIC AND ANAEROBIC Blood Culture results may not be optimal due to an inadequate volume of blood received in culture bottles   Culture   Final    NO GROWTH 5 DAYS Performed at [facility], [address], [place], [place] [address]    Report Status 02/13/2023 FINAL  Final  Culture, blood (Routine X 2) w Reflex to ID Panel     Status: None   Collection Time: 02/08/23  9:54 AM   Specimen: BLOOD LEFT HAND  Result Value Ref Range Status   Specimen Description BLOOD LEFT HAND  Final   Special Requests   Final    BOTTLES DRAWN AEROBIC AND ANAEROBIC Blood Culture results may not be optimal due to an inadequate volume of blood received in culture bottles   Culture   Final    NO GROWTH 5 DAYS Performed at [facility], [address], [place], [place] [address]    Report Status 02/13/2023 FINAL  Final  Body fluid culture w Gram Stain     Status: None   Collection Time: 02/08/23 11:04 AM   Specimen: Pleural Fluid  Result Value Ref Range Status   Specimen Description  PLEURAL  Final   Special Requests  NONE  Final   Gram Stain   Final    ABUNDANT WBC PRESENT, PREDOMINANTLY PMN ABUNDANT GRAM NEGATIVE RODS ABUNDANT GRAM POSITIVE COCCI IN PAIRS IN CHAINS Gram Stain Report Called to,Read Back By and Verified With: C. HICKLING [id number] @ 1519 FH    Culture   Final    MODERATE CAMPYLOBACTER SPECIES MODERATE PEPTOSTREPTOCOCCUS MICROS WITHIN MIXED ANAEROBIC ORGANISMS Performed at [facility], [address] [place], [place] [address]    Report Status 02/12/2023 FINAL  Final  Fungus Culture With Stain     Status: None (Preliminary result)   Collection Time: 02/08/23 11:04 AM   Specimen: Pleural Fluid  Result Value Ref Range Status   Fungus Stain Final report  Final    Comment: (NOTE) Performed At: [facility] [address] [place], [place] [id number] Jolene Schimke MD OA:[id number]    Fungus (Mycology) Culture PENDING  Incomplete   Fungal Source PLEURAL  Final    Comment: Performed at [facility], [address] [place], [place] [address]  Fungus Culture Result     Status: None   Collection Time: 02/08/23 11:04 AM  Result Value Ref Range Status   Result 1 Comment  Final    Comment: (NOTE) KOH/Calcofluor preparation:  no fungus observed. Performed At: [facility] [address] [place], [place] [id number] Jolene Schimke MD UR:[id number]   Aerobic/Anaerobic Culture w Gram Stain (surgical/deep wound)     Status: Abnormal   Collection Time: 02/10/23  9:23 AM   Specimen: Abscess  Result Value Ref Range Status   Specimen Description ABSCESS  Final   Special Requests chest  Final   Gram Stain   Final    ABUNDANT WBC PRESENT, PREDOMINANTLY PMN ABUNDANT GRAM POSITIVE COCCI RARE GRAM POSITIVE RODS    Culture (A)  Final    STREPTOCOCCUS CONSTELLATUS NO ANAEROBES ISOLATED Performed at [facility], [address], [place], [place] [address]    Report Status 02/16/2023 FINAL  Final   Organism ID, Bacteria STREPTOCOCCUS CONSTELLATUS  Final       Susceptibility   Streptococcus constellatus - MIC*    PENICILLIN <=0.06 SENSITIVE Sensitive     CEFTRIAXONE <=0.12 SENSITIVE Sensitive     ERYTHROMYCIN <=0.12 SENSITIVE Sensitive     LEVOFLOXACIN <=0.25 SENSITIVE Sensitive     VANCOMYCIN 0.5 SENSITIVE Sensitive     * STREPTOCOCCUS CONSTELLATUS  Body fluid culture w Gram Stain     Status: None (Preliminary result)   Collection Time: 02/17/23  9:04 AM   Specimen: Pleura; Body Fluid  Result Value Ref Range Status   Specimen Description PLEURAL  Final   Special Requests body fluid  Final   Gram Stain   Final    FEW WBC PRESENT, PREDOMINANTLY PMN NO ORGANISMS SEEN Performed at [facility], [address] [place], [place] [address]    Culture PENDING  Incomplete   Report Status PENDING  Incomplete     Studies: DG Chest Port 1 View Result Date: 02/17/2023 CLINICAL DATA:  Pneumothorax. EXAM: PORTABLE CHEST 1 VIEW COMPARISON:  Radiograph yesterday FINDINGS: Left upper extremity PICC remains in place. The more superior pigtail catheter on the left has been removed. The second pigtail catheter remains unchanged in position left basilar pneumothorax is not significantly changed, 16 mm from the chest wall laterally. Left hemithorax opacities peripherally and at the base are unchanged. Stable appearance of the right lung, mild ill-defined patchy  opacity in the right upper lung zone. Stable heart size and mediastinal contours. IMPRESSION: 1. Interval removal of more superior pigtail catheter on the left. The second pigtail catheter remains unchanged in position. 2. Unchanged left basilar pneumothorax. 3. Unchanged left hemithorax opacities. Electronically Signed   By: Narda Rutherford M.D.   On: 02/17/2023 11:34      Zannie Cove, MD  [facility] 02/18/2023  If 7PM-7AM, please contact night-coverage

## 2023-02-18 NOTE — Progress Notes (Signed)
Cardiology Rounding Note  Cardiologist: Bjorn Pippin  Patient Profile   63 yo male with COPD and ongoing tobacco use. No h/o heart disease. Admitted with AF with RVR in setting of sepsis with massive left hydro/pneumo thorax. S/p emergent left chest tube with ongoing pleural lytic therapy.   Subjective:     No complaints today.   Objective:   Weight Range: 73.8 kg Body mass index is 22.07 kg/m.   Vital Signs:   Temp:  [97.8 F (36.6 C)-98.5 F (36.9 C)] 98.3 F (36.8 C) (01/31 0700) Pulse Rate:  [82-93] 92 (01/31 0700) Resp:  [14-20] 18 (01/31 0700) BP: (97-124)/(57-71) 124/71 (01/31 0700) SpO2:  [90 %-94 %] 91 % (01/31 0700) Weight:  [73.8 kg] 73.8 kg (01/31 0345) Last BM Date : 02/17/23  Weight change: Filed Weights   02/16/23 0400 02/17/23 0253 02/18/23 0345  Weight: 80.4 kg 75.7 kg 73.8 kg    Intake/Output:   Intake/Output Summary (Last 24 hours) at 02/18/2023 0746 Last data filed at 02/18/2023 0400 Gross per 24 hour  Intake 620.19 ml  Output 1000 ml  Net -379.81 ml      Physical Exam   General: Well developed, well nourished, NAD  HEENT: OP clear, mucus membranes moist  SKIN: warm, dry. No rashes. Neuro: No focal deficits  Musculoskeletal: Muscle strength 5/5 all ext  Psychiatric: Mood and affect normal  Neck: No JVD, no carotid bruits, no thyromegaly, no lymphadenopathy.  Lungs:Clear bilaterally, no wheezes, rhonci, crackles Cardiovascular: Regular rate and rhythm. No murmurs, gallops or rubs. Abdomen:Soft. Bowel sounds present. Non-tender.  Extremities: No lower extremity edema. Pulses are 2 + in the bilateral DP/PT.  Telemetry   Sinus   personally reviewed   EKG    N/A   Labs    CBC Recent Labs    02/17/23 0340 02/18/23 0401  WBC 12.9* 9.6  HGB 9.6* 7.8*  HCT 30.1* 24.1*  MCV 93.2 94.1  PLT 277 264   Basic Metabolic Panel Recent Labs    16/10/96 0225 02/17/23 0848 02/18/23 0401  NA 130* 134* 134*  K 4.0 3.8 3.0*  CL 99  104 110  CO2 21* 23 21*  GLUCOSE 131* 105* 120*  BUN 11 11 8   CREATININE 0.67 0.68 0.46*  CALCIUM 7.9* 7.8* 6.6*  MG 2.0 1.7  --   PHOS 2.9  --   --    Liver Function Tests No results for input(s): "AST", "ALT", "ALKPHOS", "BILITOT", "PROT", "ALBUMIN" in the last 72 hours.  No results for input(s): "LIPASE", "AMYLASE" in the last 72 hours.  Cardiac Enzymes No results for input(s): "CKTOTAL", "CKMB", "CKMBINDEX", "TROPONINI" in the last 72 hours.  BNP: BNP (last 3 results) No results for input(s): "BNP" in the last 8760 hours.  ProBNP (last 3 results) No results for input(s): "PROBNP" in the last 8760 hours.   D-Dimer No results for input(s): "DDIMER" in the last 72 hours. Hemoglobin A1C No results for input(s): "HGBA1C" in the last 72 hours. Fasting Lipid Panel No results for input(s): "CHOL", "HDL", "LDLCALC", "TRIG", "CHOLHDL", "LDLDIRECT" in the last 72 hours. Thyroid Function Tests Recent Labs    02/17/23 0909  TSH 5.286*    Other results:   Imaging     Medications:     Scheduled Medications:  azithromycin  500 mg Oral Daily   Chlorhexidine Gluconate Cloth  6 each Topical Q0600   feeding supplement  237 mL Oral BID BM   Gerhardt's butt cream   Topical TID  lactulose  20 g Oral BID   lidocaine  5 mL Intradermal Once   methocarbamol (ROBAXIN) injection  500 mg Intravenous Q8H   Or   methocarbamol  500 mg Oral Q8H   metoprolol tartrate  12.5 mg Oral BID   mupirocin ointment   Nasal BID   nicotine  21 mg Transdermal Daily   oxyCODONE  10 mg Oral Q6H   pantoprazole  40 mg Oral Daily   polyethylene glycol  17 g Oral Daily   potassium chloride  40 mEq Oral BID   sodium chloride flush  10 mL Intrapleural Q8H   sodium chloride flush  10-40 mL Intracatheter Q12H   spironolactone  12.5 mg Oral Daily    Infusions:  amiodarone 30 mg/hr (02/17/23 2054)   ampicillin-sulbactam (UNASYN) IV 3 g (02/18/23 0344)    PRN Medications: acetaminophen **OR**  acetaminophen, docusate sodium, HYDROmorphone (DILAUDID) injection, hydrOXYzine, LORazepam, metoprolol tartrate, ondansetron **OR** ondansetron (ZOFRAN) IV, mouth rinse, sodium chloride flush, zolpidem   Assessment/Plan   1. New onset AF with RVR: Sinus today. He has converted back to atrial fib two times now when IV amiodarone was stopped. While his acute pulmonary illness is resolving, I would plan to continue the IV amiodarone. His heparin is on hold while receiving pleural lytics. Can consider a DOAC before discharge.    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 Reduced LVEF likely tachycardia mediated. I would not consider an ischemia workup at this time. He will need a repeat echo during outpatient follow up in several months.    Verne Carrow, MD , [facility] 02/18/2023, 7:46 AM

## 2023-02-18 NOTE — Progress Notes (Signed)
NAME:  Mathew Wyatt, MRN:  [id number], DOB:  [birth year], LOS: 10 ADMISSION DATE:  02/08/2023, CONSULTATION DATE: 02/08/2023  REFERRING MD: Add, CHIEF COMPLAINT: Hypotension, atrial fibrillation, large empyema on left with hydropneumothorax  History of Present Illness:  63 year old male with past medical history of tobacco use, COPD who presented to [facility] on 1/20 with cough and shortness of breath. Reportedly having worsening symptoms over the preceding 2 weeks. He denied having fever, chills, chest pain, n/v/d. He was found to be tachypneic with significant leukocytosis to 35.7, lactifc 4.4. he was started on IV antibiotics. He had CT chest which demonstrated a large left-sided loculated hydropneumothorax concerning for empyema or abscess. He was transferred to [facility] on 1/21 initially to hospitalist for CT surgery consult and possible VATS. On 1/21 he developed atrial fibrillation with RVR and hypotension and was transferred to [facility] and admitted to ICU.   Pertinent  Medical History  History reviewed. No pertinent past medical history.   Significant Hospital Events: Including procedures, antibiotic start and stop dates in addition to other pertinent events   1/20: [facility] ED with SOB. CT with large left hydropneumo c/w empyema vs abscess.  1/21: TCTS defer VATS. ICU for afib rvr hypotension, started on amiodarone. CT placed with immediate 2L purulent output. Initial tube lytics with additional 300cc output. ECHO neg for vegetation, EF 45-50% 1/22: repeat CT chest today to eval for need of additional CT vs more lytics vs observation.  1/23 new chest tube placed by IR, cultures growing Campylobacter and Peptostreptococcus 1/26 dec'd pleural output but has persistent loculated fluid collection  1/27 AF w/ RVR. Treated w/ BB. Seen by ID; vanc stopped, switched to ceftriaxone, flagyl and azith for campylobacter. CT chest obtained. The anterior fluid collection has improved. The lateral effusion vol is  less but loculated air maintains. Now has Right layering effusion, and new patchy areas of GG changes. Some mild dec in left GG changes. Repeated round 4 pleural lytics (administered in right lateral tube)  1/28: lateral CT 120 net neg after accounting for TPA. AM cxr possible marginal Improvement in loculated ptx. 1/29 no significant change after 5 doses of pleural lytic therapy.  Discussed case again with thoracic surgery who will reevaluate for possible VATS.  Lateral chest tube became dislodged, removed. 1/30 s/p 7 doses of pleural lytics. ~150 mL output from chest tube overnight.  Interim History / Subjective:  Essentially unchanged may be small improvement in rind and small improvement in pneumothorax but essentially unchanged  Objective   Blood pressure 114/73, pulse 93, temperature 98.3 F (36.8 C), temperature source Oral, resp. rate 19, height 6' (1.829 m), weight 73.8 kg, SpO2 92%.        Intake/Output Summary (Last 24 hours) at 02/18/2023 1640 Last data filed at 02/18/2023 1426 Gross per 24 hour  Intake 355.96 ml  Output 560 ml  Net -204.04 ml   Filed Weights   02/16/23 0400 02/17/23 0253 02/18/23 0345  Weight: 80.4 kg 75.7 kg 73.8 kg    Examination:  General: In bed Eyes: EOMI, icterus Neck: Supple, JVP Pulmonary: Clear normal work of breathing  Abdomen: Nondistended, bowel sounds present  Chest x-ray reviewed, serial chest x-rays reviewed, ID note reviewed, hospitalist note reviewed, cardiothoracic surgery note reviewed  Resolved problem list   Sepsis  Af w/ RVR Assessment & Plan:  Sepsis 2/2 left empyema - campylobacter and peptostreptococcus Pneumothorax due to lung entrapment, lack of lung expansion Clear respiratory source. Based on recent 2 week history  of cough, sob, may have started as pna and developed empyema. Denies alcohol use or chronic dysphagia. Significant leukocytosis to 35. Lactic 4.4. CT chest with large left sided hydropneumothorax consistent  with empyema. 1/21 CT placed with 2L out. Improving wbc. Echo without vegetation.  New 2nd anterior chest tube placed by IR under CT guidance on 1/23. Initial tube subsequently removed. S/p tpa dornase with improvement in "rind" and mild improvement in pneumothorax but persistent. - Cont chest tubes to suction, increased to -40 1/26 - Chest x-ray shows resolution of effusion, persistent pneumothorax mildly improved over time - Daily CXR while chest tube in place - con't antibiotics per ID, appreciate assistance -Patient declined surgical intervention 1/30, he now desires to talk to them again and is considering surgery and would like for it to be done per his discussion with me today.   PCCM to follow for chest tube.   Best Practice (right click and "Reselect all SmartList Selections" daily)   Per primary  Karren Burly, MD

## 2023-02-18 NOTE — Evaluation (Signed)
Clinical/Bedside Swallow Evaluation Patient Details  Name: Mathew Wyatt MRN: [id number] Date of Birth: [birth year]  Today's Date: 02/18/2023 Time: SLP Start Time (ACUTE ONLY): 1035 SLP Stop Time (ACUTE ONLY): 1043 SLP Time Calculation (min) (ACUTE ONLY): 8 min  Past Medical History: History reviewed. No pertinent past medical history. Past Surgical History:  Past Surgical History:  Procedure Laterality Date   HERNIA REPAIR     MANDIBLE SURGERY     HPI:  Mathew Wyatt is a 63 y.o. male who had initially presented to [facility] hospital on 02/07/2023 with shortness of breath dyspnea on exertion with productive cough.  He was noted to be tachypneic with significant leukocytosis and elevated lactate.  Patient was started on IV antibiotic.  CT scan of the chest however was concerning for empyema versus abscess so he was transferred to [facility] on 02/08/2023 for further evaluation. Chest tube x2. Chest imaging suggestive of pna.  Currently considering VATS to address. Pt with past medical history of COPD and a smoker.    Assessment / Plan / Recommendation  Clinical Impression  Pt presents with normal oropharyngeal swallow function as assessed clinically.  Pt tolerated all consistencies trialed with no clinical s/s of aspiration, and exhbited excellent oral clearance. Pt reports he feels like things sometimes get stuck halfway down.  It is not consistent, but it is frequent, and has lasted for about 3 years.  About once a month he has to bring something back up.  He reports a supervisor had similar symptoms to his and had good results from an EGD with dilation.  Pt's description of symptoms sounds like his dysphagia is primarily esophageal and if he wants to pursue further evalution and treatment would recommend an esophagram.  He is not sure he wants to do anything about his symptoms at this time.  It does not pose a concern for swallowing safety at present and he can continue current diet.  Briefly  reviewed esophageal dysphagia precautions. Pt has no further ST needs.  SLP will sign off.    Recommend regular texture diet with thin liquids.   SLP Visit Diagnosis: Dysphagia, unspecified (R13.10)    Aspiration Risk  No limitations    Diet Recommendation Regular;Thin liquid    Liquid Administration via: Cup;Straw Medication Administration: Whole meds with liquid Supervision: Patient able to self feed Compensations: Slow rate;Small sips/bites Postural Changes: Seated upright at 90 degrees    Other  Recommendations Recommended Consults: Consider esophageal assessment (if pt wishes to pursue further workup) Oral Care Recommendations: Oral care BID    Recommendations for follow up therapy are one component of a multi-disciplinary discharge planning process, led by the attending physician.  Recommendations may be updated based on patient status, additional functional criteria and insurance authorization.  Follow up Recommendations No SLP follow up      Assistance Recommended at Discharge  N/A  Functional Status Assessment Patient has not had a recent decline in their functional status  Frequency and Duration  (N/A)          Prognosis Prognosis for improved oropharyngeal function:  (N/A)      Swallow Study   General Date of Onset: 12/08/23 HPI: Mathew Wyatt is a 63 y.o. male who had initially presented to [facility] hospital on 02/07/2023 with shortness of breath dyspnea on exertion with productive cough.  He was noted to be tachypneic with significant leukocytosis and elevated lactate.  Patient was started on IV antibiotic.  CT scan of the chest however was concerning  for empyema versus abscess so he was transferred to [facility] on 02/08/2023 for further evaluation. Chest tube x2. Chest imaging suggestive of pna.  Currently considering VATS to address. Pt with past medical history of COPD and a smoker. Type of Study: Bedside Swallow Evaluation Previous Swallow Assessment:  None Diet Prior to this Study: Regular;Thin liquids (Level 0) History of Recent Intubation: No Behavior/Cognition: Alert;Cooperative;Pleasant mood Oral Cavity Assessment: Within Functional Limits Oral Care Completed by SLP: No Oral Cavity - Dentition: Missing dentition;Poor condition Vision: Functional for self-feeding Self-Feeding Abilities: Able to feed self Patient Positioning: Upright in bed Baseline Vocal Quality: Normal Volitional Cough: Strong Volitional Swallow: Able to elicit    Oral/Motor/Sensory Function Overall Oral Motor/Sensory Function: Within functional limits Facial ROM: Within Functional Limits Facial Symmetry: Within Functional Limits Lingual ROM: Within Functional Limits Lingual Symmetry: Within Functional Limits Velum: Within Functional Limits Mandible: Within Functional Limits   Ice Chips Ice chips: Not tested   Thin Liquid Thin Liquid: Within functional limits Presentation: Straw    Nectar Thick Nectar Thick Liquid: Not tested   Honey Thick Honey Thick Liquid: Not tested   Puree Puree: Within functional limits Presentation: Spoon   Solid     Solid: Within functional limits Presentation: Self Fed      Kerrie Pleasure, MA, CCC-SLP Acute Rehabilitation Services Office: [phone] 02/18/2023,10:59 AM

## 2023-02-18 NOTE — Progress Notes (Addendum)
Physical Therapy Treatment Patient Details Name: Mathew Wyatt MRN: [id number] DOB: 1960/03/06 Today's Date: 02/18/2023   History of Present Illness Pt is a 62yo male whou presented to [facility] on 02/07/23 with SOB, DOE, and productive cough. CT scan revealed L empyema vs abscess, was transferred to [facility] on 1/21, under went 2 chest tube placements on the L. Pt also went in afib with RVR. PMH: insomnia anxiety, acute systolic CHF, smoker, COPD    PT Comments  Pt progressing very slowly due to reluctance to mobilize. Pt is very anxious and likely this is big factor in his self limitation. Reiterated importance of sitting up OOB and amb multiple times a day. Pt with multiple excuses not to amb "right now". Eventually convinced pt to amb with me. Pt only amb 8' in the room. Becomes highly anxious and dizzy (not orthostatic). Will need to continue to encourage pt to mobilize.     If plan is discharge home, recommend the following: A lot of help with walking and/or transfers;Help with stairs or ramp for entrance;Assistance with cooking/housework;Assist for transportation   Can travel by private vehicle        Equipment Recommendations  Rolling walker (2 wheels)    Recommendations for Other Services       Precautions / Restrictions Precautions Precautions: Fall Precaution Comments: 1 chest tube Restrictions Weight Bearing Restrictions Per Provider Order: No     Mobility  Bed Mobility Overal bed mobility: Needs Assistance Bed Mobility: Rolling, Sidelying to Sit, Sit to Supine Rolling: Supervision Sidelying to sit: Supervision, HOB elevated   Sit to supine: Supervision, HOB elevated   General bed mobility comments: increased time. No need for physical assistance.    Transfers Overall transfer level: Needs assistance Equipment used: Rolling walker (2 wheels) Transfers: Sit to/from Stand Sit to Stand: Min assist           General transfer comment: Assist to power up.     Ambulation/Gait Ambulation/Gait assistance: Min assist Gait Distance (Feet): 8 Feet Assistive device: Rolling walker (2 wheels) Gait Pattern/deviations: Step-through pattern, Decreased stride length Gait velocity: decr Gait velocity interpretation: <1.31 ft/sec, indicative of household ambulator   General Gait Details: Assist for balance and support. Pt very anxious and c/o dizziness. Repeatedly saying he is too weak to do this.   Stairs             Wheelchair Mobility     Tilt Bed    Modified Rankin (Stroke Patients Only)       Balance Overall balance assessment: Needs assistance Sitting-balance support: Feet supported, No upper extremity supported Sitting balance-Leahy Scale: Fair     Standing balance support: During functional activity, Bilateral upper extremity supported Standing balance-Leahy Scale: Poor Standing balance comment: UE support                            Cognition Arousal: Alert Behavior During Therapy: Anxious Overall Cognitive Status: Within Functional Limits for tasks assessed                                 General Comments: Anxiety limiting function        Exercises      General Comments General comments (skin integrity, edema, etc.): BP stable sitting and standing 110's/70's. Pt insisted on removing SpO2 probe prior to amb. SpO2 >92% after amb on RA      Pertinent  Vitals/Pain Pain Assessment Pain Assessment: Faces Faces Pain Scale: Hurts little more Pain Location: chest tubes/flank Pain Descriptors / Indicators: Grimacing, Guarding Pain Intervention(s): Limited activity within patient's tolerance, Monitored during session    Home Living                          Prior Function            PT Goals (current goals can now be found in the care plan section) Acute Rehab PT Goals Patient Stated Goal: get stronger and go home Progress towards PT goals: Progressing toward goals     Frequency    Min 1X/week      PT Plan      Co-evaluation              AM-PAC PT "6 Clicks" Mobility   Outcome Measure  Help needed turning from your back to your side while in a flat bed without using bedrails?: A Little Help needed moving from lying on your back to sitting on the side of a flat bed without using bedrails?: A Little Help needed moving to and from a bed to a chair (including a wheelchair)?: A Little Help needed standing up from a chair using your arms (e.g., wheelchair or bedside chair)?: A Little Help needed to walk in hospital room?: Total (only goes 8') Help needed climbing 3-5 steps with a railing? : Total 6 Click Score: 14    End of Session   Activity Tolerance: Other (comment) (Limited by anxiety and self limitation) Patient left: in bed;with call bell/phone within reach Nurse Communication: Mobility status PT Visit Diagnosis: Unsteadiness on feet (R26.81);Muscle weakness (generalized) (M62.81);Difficulty in walking, not elsewhere classified (R26.2)     Time: 0981-1914 PT Time Calculation (min) (ACUTE ONLY): 24 min  Charges:    $Gait Training: 23-37 mins PT General Charges $$ ACUTE PT VISIT: 1 Visit                     [facility] PT Acute Rehabilitation Services Office [phone]    Angelina Ok [facility] 02/18/2023, 2:04 PM

## 2023-02-18 NOTE — Plan of Care (Signed)
  Problem: Fluid Volume: Goal: Hemodynamic stability will improve Outcome: Progressing   Problem: Clinical Measurements: Goal: Diagnostic test results will improve Outcome: Progressing Goal: Signs and symptoms of infection will decrease Outcome: Progressing   Problem: Respiratory: Goal: Ability to maintain adequate ventilation will improve Outcome: Progressing   Problem: Education: Goal: Knowledge of General Education information will improve Description: Including pain rating scale, medication(s)/side effects and non-pharmacologic comfort measures Outcome: Progressing   Problem: Health Behavior/Discharge Planning: Goal: Ability to manage health-related needs will improve Outcome: Progressing   Problem: Clinical Measurements: Goal: Ability to maintain clinical measurements within normal limits will improve Outcome: Progressing Goal: Will remain free from infection Outcome: Progressing Goal: Diagnostic test results will improve Outcome: Progressing Goal: Respiratory complications will improve Outcome: Progressing Goal: Cardiovascular complication will be avoided Outcome: Progressing   Problem: Activity: Goal: Risk for activity intolerance will decrease Outcome: Progressing   Problem: Nutrition: Goal: Adequate nutrition will be maintained Outcome: Progressing   Problem: Coping: Goal: Level of anxiety will decrease Outcome: Progressing   Problem: Elimination: Goal: Will not experience complications related to bowel motility Outcome: Progressing   Problem: Pain Managment: Goal: General experience of comfort will improve and/or be controlled Outcome: Progressing   Problem: Safety: Goal: Ability to remain free from injury will improve Outcome: Progressing   Problem: Skin Integrity: Goal: Risk for impaired skin integrity will decrease Outcome: Progressing

## 2023-02-18 NOTE — TOC Progression Note (Signed)
Transition of Care [facility]) - Progression Note    Patient Details  Name: Mathew Wyatt MRN: [id number] Date of Birth: [birth year]  [facility] CM/SW Contact  Harriet Masson, RN Phone Number: 02/18/2023, 11:45 AM  Clinical Narrative:    Patient reconsidering VATS surgery.  TOC following.    Expected Discharge Plan: Home/Self Care Barriers to Discharge: Continued Medical Work up  Expected Discharge Plan and Services   Discharge Planning Services: CM Consult                                           Social Determinants of Health (SDOH) Interventions SDOH Screenings   Food Insecurity: No Food Insecurity (02/12/2023)  Housing: Low Risk  (02/12/2023)  Transportation Needs: No Transportation Needs (02/12/2023)  Utilities: Not At Risk (02/12/2023)  Tobacco Use: High Risk (02/08/2023)    Readmission Risk Interventions     No data to display

## 2023-02-18 NOTE — Plan of Care (Signed)

## 2023-02-18 NOTE — Progress Notes (Signed)
Pt states his nicotine patch came off and he applied one from home in its place. Pts educated on the importance of not taking home meds.

## 2023-02-19 ENCOUNTER — Inpatient Hospital Stay (HOSPITAL_COMMUNITY): Payer: Medicaid Other

## 2023-02-19 DIAGNOSIS — I4891 Unspecified atrial fibrillation: Secondary | ICD-10-CM | POA: Diagnosis not present

## 2023-02-19 DIAGNOSIS — J869 Pyothorax without fistula: Secondary | ICD-10-CM | POA: Diagnosis not present

## 2023-02-19 DIAGNOSIS — I5021 Acute systolic (congestive) heart failure: Secondary | ICD-10-CM | POA: Diagnosis not present

## 2023-02-19 LAB — CBC
HCT: 26.9 % — ABNORMAL LOW (ref 39.0–52.0)
Hemoglobin: 8.6 g/dL — ABNORMAL LOW (ref 13.0–17.0)
MCH: 30.1 pg (ref 26.0–34.0)
MCHC: 32 g/dL (ref 30.0–36.0)
MCV: 94.1 fL (ref 80.0–100.0)
Platelets: 338 10*3/uL (ref 150–400)
RBC: 2.86 MIL/uL — ABNORMAL LOW (ref 4.22–5.81)
RDW: 16.9 % — ABNORMAL HIGH (ref 11.5–15.5)
WBC: 12.8 10*3/uL — ABNORMAL HIGH (ref 4.0–10.5)
nRBC: 0 % (ref 0.0–0.2)

## 2023-02-19 LAB — BASIC METABOLIC PANEL
Anion gap: 8 (ref 5–15)
BUN: 7 mg/dL — ABNORMAL LOW (ref 8–23)
CO2: 24 mmol/L (ref 22–32)
Calcium: 8.1 mg/dL — ABNORMAL LOW (ref 8.9–10.3)
Chloride: 102 mmol/L (ref 98–111)
Creatinine, Ser: 0.59 mg/dL — ABNORMAL LOW (ref 0.61–1.24)
GFR, Estimated: >60 mL/min (ref >60)
Glucose, Bld: 194 mg/dL — ABNORMAL HIGH (ref 70–99)
Potassium: 4 mmol/L (ref 3.5–5.1)
Sodium: 134 mmol/L — ABNORMAL LOW (ref 135–145)

## 2023-02-19 LAB — MAGNESIUM: Magnesium: 1.7 mg/dL (ref 1.7–2.4)

## 2023-02-19 MED ORDER — HEPARIN (PORCINE) 25000 UT/250ML-% IV SOLN
2550.0000 [IU]/h | INTRAVENOUS | Status: DC
  Administered 2023-02-19 – 2023-02-20 (×4): 2550 [IU]/h via INTRAVENOUS
  Filled 2023-02-19 (×4): qty 250

## 2023-02-19 MED ORDER — MELATONIN 5 MG PO TABS
5.0000 mg | ORAL_TABLET | Freq: Every day | ORAL | Status: DC
Start: 2023-02-19 — End: 2023-03-07
  Administered 2023-02-19 – 2023-03-06 (×12): 5 mg via ORAL
  Filled 2023-02-19 (×13): qty 1

## 2023-02-19 MED ORDER — HYDROMORPHONE HCL 1 MG/ML IJ SOLN
1.0000 mg | INTRAMUSCULAR | Status: DC | PRN
  Administered 2023-02-19 – 2023-03-01 (×49): 1 mg via INTRAVENOUS
  Filled 2023-02-19 (×50): qty 1

## 2023-02-19 MED ORDER — ALPRAZOLAM 0.5 MG PO TABS
0.5000 mg | ORAL_TABLET | Freq: Two times a day (BID) | ORAL | Status: DC | PRN
  Administered 2023-02-22 – 2023-03-06 (×9): 0.5 mg via ORAL
  Filled 2023-02-19 (×10): qty 1

## 2023-02-19 NOTE — Progress Notes (Signed)
NAME:  Mathew Wyatt, MRN:  [id number], DOB:  Apr 17, 1960, LOS: 11 ADMISSION DATE:  02/08/2023, CONSULTATION DATE: 02/08/2023  REFERRING MD: Add, CHIEF COMPLAINT: Hypotension, atrial fibrillation, large empyema on left with hydropneumothorax  History of Present Illness:  63 year old male with past medical history of tobacco use, COPD who presented to [facility] on 1/20 with cough and shortness of breath. Reportedly having worsening symptoms over the preceding 2 weeks. He denied having fever, chills, chest pain, n/v/d. He was found to be tachypneic with significant leukocytosis to 35.7, lactifc 4.4. he was started on IV antibiotics. He had CT chest which demonstrated a large left-sided loculated hydropneumothorax concerning for empyema or abscess. He was transferred to [facility] on 1/21 initially to hospitalist for CT surgery consult and possible VATS. On 1/21 he developed atrial fibrillation with RVR and hypotension and was transferred to [facility] and admitted to ICU.   Pertinent  Medical History  History reviewed. No pertinent past medical history.   Significant Hospital Events: Including procedures, antibiotic start and stop dates in addition to other pertinent events   1/20: [facility] ED with SOB. CT with large left hydropneumo c/w empyema vs abscess.  1/21: TCTS defer VATS. ICU for afib rvr hypotension, started on amiodarone. CT placed with immediate 2L purulent output. Initial tube lytics with additional 300cc output. ECHO neg for vegetation, EF 45-50% 1/22: repeat CT chest today to eval for need of additional CT vs more lytics vs observation.  1/23 new chest tube placed by IR, cultures growing Campylobacter and Peptostreptococcus 1/26 dec'd pleural output but has persistent loculated fluid collection  1/27 AF w/ RVR. Treated w/ BB. Seen by ID; vanc stopped, switched to ceftriaxone, flagyl and azith for campylobacter. CT chest obtained. The anterior fluid collection has improved. The lateral effusion vol is  less but loculated air maintains. Now has Right layering effusion, and new patchy areas of GG changes. Some mild dec in left GG changes. Repeated round 4 pleural lytics (administered in right lateral tube)  1/28: lateral CT 120 net neg after accounting for TPA. AM cxr possible marginal Improvement in loculated ptx. 1/29 no significant change after 5 doses of pleural lytic therapy.  Discussed case again with thoracic surgery who will reevaluate for possible VATS.  Lateral chest tube became dislodged, removed. 1/30 s/p 7 doses of pleural lytics. ~150 mL output from chest tube overnight. 1/31 Essentially unchanged may be small improvement in rind and small improvement in pneumothorax  2/1 no issues overnight, 200 out of chest tube in last 24 hours  Interim History / Subjective:  Denies any acute complaints this a.m., again states he would like to proceed with VATS  Objective   Blood pressure 113/69, pulse 96, temperature 98.3 F (36.8 C), temperature source Oral, resp. rate 18, height 6' (1.829 m), weight 71.6 kg, SpO2 90%.        Intake/Output Summary (Last 24 hours) at 02/19/2023 0825 Last data filed at 02/19/2023 0805 Gross per 24 hour  Intake 1686.48 ml  Output 2030 ml  Net -343.52 ml   Filed Weights   02/17/23 0253 02/18/23 0345 02/19/23 0422  Weight: 75.7 kg 73.8 kg 71.6 kg    Examination: General: Acute on chronic ill-appearing middle-age male lying in bed in no acute distress HEENT: [place]/AT, MM pink/moist, PERRL,  Neuro: Hard and oriented x 3, nonfocal CV: s1s2 regular rate and rhythm, no murmur, rubs, or gallops,  PULM: Clear to auscultation bilaterally, no increased work of breathing, no added breath sounds GI:  soft, bowel sounds active in all 4 quadrants, non-tender, non-distended, tolerating oral diet Extremities: warm/dry, no edema  Skin: no rashes or lesions   Resolved Problems   Sepsis  Af w/ RVR  Assessment & Plan:  Sepsis 2/2 left empyema - campylobacter and  peptostreptococcus Pneumothorax due to lung entrapment, lack of lung expansion -Clear respiratory source. Based on recent 2 week history of cough, sob, may have started as pna and developed empyema. Denies alcohol use or chronic dysphagia. Significant leukocytosis to 35. Lactic 4.4. CT chest with large left sided hydropneumothorax consistent with empyema.  -1/21 CT placed with 2L out. Improving wbc. Echo without vegetation.   1/23 New 2nd anterior chest tube placed by IR under CT guidance. Initial tube subsequently removed. S/p tpa dornase with improvement in "rind" and mild improvement in pneumothorax but persistent. P: Routine chest tube care Continue -40 suction chest tube Daily chest x-ray while chest tube in place Antibiotics per ID Patient declined surgical intervention 1/30 but per discussion with Dr. Judeth Horn 1/31 he is now agreeable for surgery.   Signature  Emilee Market D. Harris, NP-C [facility] Personal contact information can be found on Amion  If no contact or response made please call [phone] 02/19/2023, 8:31 AM

## 2023-02-19 NOTE — Plan of Care (Signed)
   Problem: Fluid Volume: Goal: Hemodynamic stability will improve Outcome: Progressing   Problem: Clinical Measurements: Goal: Diagnostic test results will improve Outcome: Progressing

## 2023-02-19 NOTE — Progress Notes (Signed)
PHARMACY - ANTICOAGULATION CONSULT NOTE  Pharmacy Consult for IV heparin Indication: atrial fibrillation  No Known Allergies  Patient Measurements: Height: 6' (182.9 cm) Weight: 71.6 kg (157 lb 13.6 oz) IBW/kg (Calculated) : 77.6 Heparin Dosing Weight: ~ 80 kg  Vital Signs: Temp: 98.2 F (36.8 C) (02/01 1123) Temp Source: Oral (02/01 1123) BP: 114/59 (02/01 1124) Pulse Rate: 100 (02/01 1124)  Labs: Recent Labs    02/17/23 0848 02/18/23 0401 02/18/23 1425 02/19/23 0440  HGB  --  7.8* 8.8* 8.6*  HCT  --  24.1* 27.2* 26.9*  PLT  --  264 327 338  CREATININE 0.68 0.46*  --  0.59*    Estimated Creatinine Clearance: 97 mL/min (A) (by C-G formula based on SCr of 0.59 mg/dL (L)).   Medical History: History reviewed. No pertinent past medical history.  Medications:  Infusions:   amiodarone 30 mg/hr (02/19/23 1121)   ampicillin-sulbactam (UNASYN) IV 3 g (02/19/23 1130)    Assessment: 63 yo male with new onset afib, initially converted to NSR following placement of chest tube however patient had converted back to afib. Pharmacy asked to start anticoagulation with IV heparin.  No known anticoagulants PTA or hx bleeding.  Patient now completed therapy with fibrinolytics (last dose 1/29), ok to resume IV heparin for afib. Patient now reconsidering VATS/decortication, TCTS consult pending.  Hgb 8.6, Plts 338, SCr 0.59  Goal of Therapy:  Heparin level 0.3-0.7 units/ml Monitor platelets by anticoagulation protocol: Yes   Plan:  Resume heparin gtt at 2550 units/hr based on previous dosing Check heparin level in 6hr Daily heparin level, CBC, s/s bleeding F/u TCTS consult - f/u when heparin needs to be held prior to surgery  Loralee Pacas, PharmD, BCPS Clinical Pharmacist **Pharmacist phone directory can now be found on [email or web address] ([email or web address]).  Listed under [facility].

## 2023-02-19 NOTE — Progress Notes (Signed)
PROGRESS NOTE  Anel Purohit ZOX:[id number] DOB: [birth year] DOA: 02/08/2023 PCP: Patient, No Pcp Per   LOS: 11 days   Brief narrative:  Jerrin Recore is a 63 y.o. male with past medical history of COPD and a smoker who had initially presented to [facility] hospital on 02/07/2023 with shortness of breath dyspnea on exertion with productive cough.  He was noted to be tachypneic with significant leukocytosis and elevated lactate.  Patient was started on IV antibiotic.  CT scan of the chest however was concerning for empyema versus abscess so he was transferred to [facility] on 02/08/2023 for further evaluation.  Patient however went into A-fib with RVR and hypotension and was admitted to ICU service.   1/21: TCTS defer VATS. ICU for afib rvr hypotension, started on amiodarone. CT placed with immediate 2L purulent output. Initial tube lytics with additional 300cc output. ECHO neg for vegetation, EF 45-50% 1/22: repeat CT chest 1/23 new chest tube placed by IR, cultures growing Campylobacter and Peptostreptococcus 1/26 dec'd pleural output but has persistent loculated fluid collection  1/27 AF w/ RVR. Treated w/ BB. Seen by ID; vanc stopped, switched to ceftriaxone, flagyl and azith for campylobacter. CT chest obtained. The anterior fluid collection has improved. The lateral effusion vol is less but loculated air maintains. Now has Right layering effusion, and new patchy areas of GG changes. Some mild dec in left GG changes. Repeated round 4 pleural lytics (administered in right lateral tube)  1/28: lateral CT 120 net neg  1/30 no significant change after 7 doses of pleural lytic therapy.  -1/30, seen by Dr. Cliffton Asters, patient refused VATS -1/31: Changed his mind, now agrees to surgery, TCTS notified  Subjective: -Feels fair, no events overnight, some anxiety, could not sleep last night  Assessment/Plan:  Sepsis secondary to left empyema, pneumonia Acute hypoxic respiratory failure -Status post  chest tube placement x2.  Second chest tube placement by IR 02/10/2023.  -Was on ceftriaxone, Flagyl and azithromycin now on IV Unasyn Day 2 -Orthopantogram with dental caries, unfortunately no inpatient dental coverage available now, would need outpatient follow-up -Pleural fluid cultures initially grew Campylobacter and Peptostreptococcus  -Repeat cultures with Streptococcus constellatus, pansensitive -CT surgery, pulmonary and ID following.    -Has completed 7 doses of fibrinolytic therapy without considerable improvement, T CTS consulted, seen by Dr. Cliffton Asters 1/30 patient refused VATS/decortication,  -1/31 reconsidering surgery, T CTS notified yesterday -Holding anticoagulation   New onset atrial fibrillation with RVR.   -Was on amiodarone drip, then changed to p.o., back on amiodarone gtt. again, continue metoprolol, RVR likely driven by empyema -Hold heparin with empyema and ongoing fibrinolytic therapy  Acute on chronic anemia Yesterday's labs likely spuriously, stabilizing, monitor  Acute systolic congestive heart failure with moderately reduced ejection fraction.  2D echocardiogram 45 to 50% with global hypokinesis. 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  Continue nebulizers.   insomnia anxiety.  On muscle relaxant Atarax Ambien and pain medication -Add Xanax as needed   DVT prophylaxis: SCDs  disposition: Home pending resolution of empyema    Code Status:    Code Status: Full Code  Family Communication: None at bedside  Consultants: CT surgery PCCM Interventional radiology Cardiology  Procedures: Left-sided chest tube placement x 2. Fibrinolytic treatment x 3  Anti-infectives:  Rocephin Flagyl and Zithromax.  Anti-infectives (From admission, onward)    Start     Dose/Rate Route Frequency  Ordered Stop   02/17/23 2000   Ampicillin-Sulbactam (UNASYN) 3 g in sodium chloride 0.9 % 100 mL IVPB        3 g 200 mL/hr over 30 Minutes Intravenous Every 6 hours 02/17/23 0917     02/14/23 2200  metroNIDAZOLE (FLAGYL) tablet 500 mg  Status:  Discontinued        500 mg Oral Every 12 hours 02/14/23 1412 02/17/23 0917   02/14/23 2000  cefTRIAXone (ROCEPHIN) 2 g in sodium chloride 0.9 % 100 mL IVPB  Status:  Discontinued        2 g 200 mL/hr over 30 Minutes Intravenous Every 24 hours 02/14/23 1410 02/17/23 0917   02/14/23 1500  azithromycin (ZITHROMAX) tablet 500 mg        500 mg Oral Daily 02/14/23 1410     02/12/23 0200  vancomycin (VANCOREADY) IVPB 1500 mg/300 mL  Status:  Discontinued        1,500 mg 150 mL/hr over 120 Minutes Intravenous Every 12 hours 02/11/23 1301 02/14/23 1410   02/11/23 1430  vancomycin (VANCOCIN) 750 mg in sodium chloride 0.9 % 250 mL IVPB  Status:  Discontinued        750 mg 265 mL/hr over 60 Minutes Intravenous  Once 02/11/23 1309 02/11/23 1354   02/11/23 1430  vancomycin (VANCOCIN) 750 mg in sodium chloride 0.9 % 250 mL IVPB        750 mg 250 mL/hr over 60 Minutes Intravenous  Once 02/11/23 1354 02/11/23 1543   02/11/23 1400  vancomycin (VANCOREADY) IVPB 750 mg/150 mL  Status:  Discontinued        750 mg 150 mL/hr over 60 Minutes Intravenous  Once 02/11/23 1301 02/11/23 1309   02/10/23 2130  metroNIDAZOLE (FLAGYL) IVPB 500 mg  Status:  Discontinued        500 mg 100 mL/hr over 60 Minutes Intravenous Every 12 hours 02/10/23 1434 02/14/23 1410   02/08/23 1730  metroNIDAZOLE (FLAGYL) IVPB 500 mg  Status:  Discontinued        500 mg 100 mL/hr over 60 Minutes Intravenous Every 8 hours 02/08/23 1637 02/10/23 1434   02/08/23 0400  vancomycin (VANCOREADY) IVPB 750 mg/150 mL  Status:  Discontinued        750 mg 150 mL/hr over 60 Minutes Intravenous Every 12 hours 02/08/23 0324 02/08/23 0328   02/08/23 0400  ceFEPIme (MAXIPIME) 2 g in sodium chloride 0.9 % 100 mL IVPB  Status:  Discontinued         2 g 200 mL/hr over 30 Minutes Intravenous Every 8 hours 02/08/23 0324 02/14/23 1410   02/08/23 0400  vancomycin (VANCOCIN) 750 mg in sodium chloride 0.9 % 250 mL IVPB  Status:  Discontinued        750 mg 250 mL/hr over 60 Minutes Intravenous Every 12 hours 02/08/23 0328 02/11/23 1258       Objective: Vitals:   02/19/23 0422 02/19/23 0758  BP: 105/75 113/69  Pulse: 96   Resp: (!) 22 18  Temp: 98.7 F (37.1 C) 98.3 F (36.8 C)  SpO2: 90%     Intake/Output Summary (Last 24 hours) at 02/19/2023 1003 Last data filed at 02/19/2023 0805 Gross per 24 hour  Intake 1686.48 ml  Output 2030 ml  Net -343.52 ml   Filed Weights   02/17/23 0253 02/18/23 0345 02/19/23 0422  Weight: 75.7 kg 73.8 kg 71.6 kg   Body mass index is 21.41 kg/m.   Physical Exam:  AAOx3, no distress  HEENT: No JVD CVS: S1-S2, regular rhythm Lungs: Few scattered rhonchi on the left, chest tube Abdomen: Soft, nontender, bowel sounds present Extremities: No edema  Data Review: I have personally reviewed the following laboratory data and studies,  CBC: Recent Labs  Lab 02/16/23 0225 02/17/23 0340 02/18/23 0401 02/18/23 1425 02/19/23 0440  WBC 10.4 12.9* 9.6 11.8* 12.8*  HGB 9.4* 9.6* 7.8* 8.8* 8.6*  HCT 29.3* 30.1* 24.1* 27.2* 26.9*  MCV 93.0 93.2 94.1 93.8 94.1  PLT 256 277 264 327 338   Basic Metabolic Panel: Recent Labs  Lab 02/14/23 0217 02/15/23 0440 02/16/23 0225 02/17/23 0848 02/18/23 0401 02/19/23 0440  NA 132* 134* 130* 134* 134* 134*  K 3.9 4.3 4.0 3.8 3.0* 4.0  CL 103 105 99 104 110 102  CO2 20* 23 21* 23 21* 24  GLUCOSE 109* 121* 131* 105* 120* 194*  BUN 5* 9 11 11 8  7*  CREATININE 0.68 0.68 0.67 0.68 0.46* 0.59*  CALCIUM 7.7* 8.0* 7.9* 7.8* 6.6* 8.1*  MG 1.8 1.9 2.0 1.7  --  1.7  PHOS 2.7  --  2.9  --   --   --    Liver Function Tests: No results for input(s): "AST", "ALT", "ALKPHOS", "BILITOT", "PROT", "ALBUMIN" in the last 168 hours.  No results for input(s):  "LIPASE", "AMYLASE" in the last 168 hours.  No results for input(s): "AMMONIA" in the last 168 hours. Cardiac Enzymes: No results for input(s): "CKTOTAL", "CKMB", "CKMBINDEX", "TROPONINI" in the last 168 hours. BNP (last 3 results) No results for input(s): "BNP" in the last 8760 hours.  ProBNP (last 3 results) No results for input(s): "PROBNP" in the last 8760 hours.  CBG: No results for input(s): "GLUCAP" in the last 168 hours. Recent Results (from the past 240 hours)  Aerobic/Anaerobic Culture w Gram Stain (surgical/deep wound)     Status: Abnormal   Collection Time: 02/10/23  9:23 AM   Specimen: Abscess  Result Value Ref Range Status   Specimen Description ABSCESS  Final   Special Requests chest  Final   Gram Stain   Final    ABUNDANT WBC PRESENT, PREDOMINANTLY PMN ABUNDANT GRAM POSITIVE COCCI RARE GRAM POSITIVE RODS    Culture (A)  Final    STREPTOCOCCUS CONSTELLATUS NO ANAEROBES ISOLATED Performed at [facility], [address], [place], [place] [address]    Report Status 02/16/2023 FINAL  Final   Organism ID, Bacteria STREPTOCOCCUS CONSTELLATUS  Final      Susceptibility   Streptococcus constellatus - MIC*    PENICILLIN <=0.06 SENSITIVE Sensitive     CEFTRIAXONE <=0.12 SENSITIVE Sensitive     ERYTHROMYCIN <=0.12 SENSITIVE Sensitive     LEVOFLOXACIN <=0.25 SENSITIVE Sensitive     VANCOMYCIN 0.5 SENSITIVE Sensitive     * STREPTOCOCCUS CONSTELLATUS  Body fluid culture w Gram Stain     Status: None (Preliminary result)   Collection Time: 02/17/23  9:04 AM   Specimen: Pleura; Body Fluid  Result Value Ref Range Status   Specimen Description PLEURAL  Final   Special Requests body fluid  Final   Gram Stain   Final    FEW WBC PRESENT, PREDOMINANTLY PMN NO ORGANISMS SEEN    Culture   Final    NO GROWTH 2 DAYS Performed at [facility], [address], [place], [place] [address]    Report Status PENDING  Incomplete     Studies: DG CHEST PORT 1  VIEW Result Date: 02/19/2023 CLINICAL DATA:  63 year old male with history of chest tube. EXAM: PORTABLE CHEST 1 VIEW COMPARISON:  Chest x-ray 02/18/2023. FINDINGS: Small bore left-sided chest tube with tip projecting over the mid left hemithorax, similar to the prior study. Increasing lucency in the mid to lower left hemithorax concerning for probable loculated pneumothorax. There is also a fluid level in this region suggesting hydropneumothorax. Areas of interstitial prominence, peribronchial cuffing and patchy multifocal airspace disease most evident in the mid to upper lungs, slightly increased compared to the prior study. No right pleural effusion. No right pneumothorax. No evidence of pulmonary edema. Heart size appears normal. Upper mediastinal contours are within normal limits. IMPRESSION: 1. Increasing lucency in the mid to lower left hemithorax concerning for loculated hydropneumothorax, which has increased despite the presence of a left-sided chest tube. 2. Slight interval worsening of aeration in the mid to upper lungs bilaterally, concerning for worsening multifocal pneumonia. Electronically Signed   By: Trudie Reed M.D.   On: 02/19/2023 08:50   DG CHEST PORT 1 VIEW Result Date: 02/18/2023 CLINICAL DATA:  Chest tube EXAM: PORTABLE CHEST 1 VIEW COMPARISON:  X-ray 02/17/2023 and older FINDINGS: Stable pigtail catheter at the left lung base. Lateral small pneumothorax may be slightly larger today but this could be technical. Recommend continued follow-up. Adjacent left mid lower lung parenchymal opacities are again seen with component of pleural fluid. Subtle right upper lung opacity is slightly more apparent today. No right-sided pneumothorax or effusion. Stable enlarged cardiopericardial silhouette. Stable left-sided PICC. Overlapping cardiac leads. IMPRESSION: Left-sided pigtail catheter with a lateral pneumothorax. The pneumothorax may be slightly larger today. Recommend continued follow-up.  Persistent left lung opacity with left effusion. Slight increasing right mid upper lung opacity. Recommend follow-up Electronically Signed   By: Karen Kays M.D.   On: 02/18/2023 16:19      Zannie Cove, MD  [facility] 02/19/2023  If 7PM-7AM, please contact night-coverage

## 2023-02-20 ENCOUNTER — Inpatient Hospital Stay (HOSPITAL_COMMUNITY): Payer: Medicaid Other

## 2023-02-20 DIAGNOSIS — I5021 Acute systolic (congestive) heart failure: Secondary | ICD-10-CM | POA: Diagnosis not present

## 2023-02-20 DIAGNOSIS — I4891 Unspecified atrial fibrillation: Secondary | ICD-10-CM | POA: Diagnosis not present

## 2023-02-20 DIAGNOSIS — J869 Pyothorax without fistula: Secondary | ICD-10-CM | POA: Diagnosis not present

## 2023-02-20 LAB — CBC
HCT: 26.4 % — ABNORMAL LOW (ref 39.0–52.0)
Hemoglobin: 8.5 g/dL — ABNORMAL LOW (ref 13.0–17.0)
MCH: 30.1 pg (ref 26.0–34.0)
MCHC: 32.2 g/dL (ref 30.0–36.0)
MCV: 93.6 fL (ref 80.0–100.0)
Platelets: 345 10*3/uL (ref 150–400)
RBC: 2.82 MIL/uL — ABNORMAL LOW (ref 4.22–5.81)
RDW: 17 % — ABNORMAL HIGH (ref 11.5–15.5)
WBC: 12.1 10*3/uL — ABNORMAL HIGH (ref 4.0–10.5)
nRBC: 0 % (ref 0.0–0.2)

## 2023-02-20 LAB — BASIC METABOLIC PANEL
Anion gap: 6 (ref 5–15)
BUN: 11 mg/dL (ref 8–23)
CO2: 26 mmol/L (ref 22–32)
Calcium: 7.8 mg/dL — ABNORMAL LOW (ref 8.9–10.3)
Chloride: 100 mmol/L (ref 98–111)
Creatinine, Ser: 0.73 mg/dL (ref 0.61–1.24)
GFR, Estimated: >60 mL/min (ref >60)
Glucose, Bld: 187 mg/dL — ABNORMAL HIGH (ref 70–99)
Potassium: 4.3 mmol/L (ref 3.5–5.1)
Sodium: 132 mmol/L — ABNORMAL LOW (ref 135–145)

## 2023-02-20 LAB — RESPIRATORY PANEL BY PCR

## 2023-02-20 LAB — HEPARIN LEVEL (UNFRACTIONATED)
Heparin Unfractionated: 0.4 [IU]/mL (ref 0.30–0.70)
Heparin Unfractionated: 0.57 [IU]/mL (ref 0.30–0.70)

## 2023-02-20 LAB — BODY FLUID CULTURE W GRAM STAIN: Culture: NO GROWTH

## 2023-02-20 LAB — GLUCOSE, CAPILLARY: Glucose-Capillary: 132 mg/dL — ABNORMAL HIGH (ref 70–99)

## 2023-02-20 MED ORDER — SODIUM CHLORIDE 0.9 % IV SOLN
2.0000 g | Freq: Three times a day (TID) | INTRAVENOUS | Status: DC
  Administered 2023-02-20 – 2023-02-24 (×12): 2 g via INTRAVENOUS
  Filled 2023-02-20 (×12): qty 12.5

## 2023-02-20 MED ORDER — METRONIDAZOLE 500 MG PO TABS
500.0000 mg | ORAL_TABLET | Freq: Three times a day (TID) | ORAL | Status: DC
  Administered 2023-02-20 – 2023-02-24 (×9): 500 mg via ORAL
  Filled 2023-02-20 (×13): qty 1

## 2023-02-20 MED ORDER — IPRATROPIUM-ALBUTEROL 0.5-2.5 (3) MG/3ML IN SOLN: 3.0000 mL | RESPIRATORY_TRACT | Status: DC | PRN

## 2023-02-20 MED ORDER — IPRATROPIUM-ALBUTEROL 0.5-2.5 (3) MG/3ML IN SOLN
3.0000 mL | Freq: Four times a day (QID) | RESPIRATORY_TRACT | Status: DC
  Administered 2023-02-20 – 2023-02-21 (×2): 3 mL via RESPIRATORY_TRACT
  Filled 2023-02-20 (×3): qty 3

## 2023-02-20 NOTE — Progress Notes (Signed)
PROGRESS NOTE  Mathew Wyatt ZOX:[id number] DOB: 18-Oct-1960 DOA: 02/08/2023 PCP: Patient, No Pcp Per   LOS: 12 days   Brief narrative:  Mathew Wyatt is a 63 y.o. male with past medical history of COPD and a smoker who had initially presented to [facility] hospital on 02/07/2023 with shortness of breath dyspnea on exertion with productive cough.  He was noted to be tachypneic with significant leukocytosis and elevated lactate.  Patient was started on IV antibiotic.  CT scan of the chest however was concerning for empyema versus abscess so he was transferred to [facility] on 02/08/2023 for further evaluation.  Patient however went into A-fib with RVR and hypotension and was admitted to ICU service.   1/21: TCTS defer VATS. ICU for afib rvr hypotension, started on amiodarone. CT placed with immediate 2L purulent output. Initial tube lytics with additional 300cc output. ECHO neg for vegetation, EF 45-50% 1/22: repeat CT chest 1/23 new chest tube placed by IR, cultures growing Campylobacter and Peptostreptococcus 1/26 dec'd pleural output but has persistent loculated fluid collection  1/27 AF w/ RVR. Treated w/ BB. Seen by ID; vanc stopped, switched to ceftriaxone, flagyl and azith for campylobacter. CT chest obtained. The anterior fluid collection has improved. The lateral effusion vol is less but loculated air maintains. Now has Right layering effusion, and new patchy areas of GG changes. Some mild dec in left GG changes. Repeated round 4 pleural lytics (administered in right lateral tube)  1/28: lateral CT 120 net neg  1/30 no significant change after 7 doses of pleural lytic therapy.  -1/30, seen by Dr. Cliffton Asters, patient refused VATS -1/31: Changed his mind, now agrees to surgery, TCTS notified -2/1: PCCM restarted Heparin gtt  Subjective: -Slept better last night, some cough, anxiety  Assessment/Plan:  Sepsis secondary to left empyema, pneumonia Acute hypoxic respiratory failure -Status  post chest tube placement x2.  Second chest tube placement by IR 02/10/2023.  -Was on ceftriaxone, Flagyl and azithromycin now on IV Unasyn Day 2 -Orthopantogram with dental caries, unfortunately no inpatient dental coverage available now, would need outpatient follow-up -Pleural fluid cultures initially grew Campylobacter and Peptostreptococcus  -Repeat cultures with Streptococcus constellatus, pansensitive -CT surgery, pulmonary and ID following.    -Has completed 7 doses of fibrinolytic therapy without considerable improvement, T CTS consulted, seen by Dr. Cliffton Asters 1/30 patient refused VATS/decortication,  -1/31 now willing to have surgery, T CTS notified yesterday -PUlm foll and restarted Heparin yesterday  New onset atrial fibrillation with RVR.   -Was on amiodarone drip, then changed to p.o., back on amiodarone gtt. again, continue metoprolol, RVR likely driven by empyema -we were holding heparin with empyema and ongoing fibrinolytic therapy -2/1 back on heparin  Acute on chronic anemia -Will factorial, now stabilizing, monitor  Acute systolic congestive heart failure with moderately reduced ejection fraction.  2D echocardiogram 45 to 50% with global hypokinesis. 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  Continue nebulizers.   insomnia anxiety.  On muscle relaxant Atarax Ambien and pain medication -Add Xanax as needed   DVT prophylaxis: SCDs  disposition: Home pending resolution of empyema    Code Status:    Code Status: Full Code  Family Communication: None at bedside  Consultants: CT surgery PCCM Interventional radiology Cardiology  Procedures: Left-sided chest tube placement x 2. Fibrinolytic treatment x 3  Anti-infectives:  Rocephin Flagyl and Zithromax.  Anti-infectives (From admission, onward)  Start     Dose/Rate Route  Frequency Ordered Stop   02/17/23 2000  Ampicillin-Sulbactam (UNASYN) 3 g in sodium chloride 0.9 % 100 mL IVPB        3 g 200 mL/hr over 30 Minutes Intravenous Every 6 hours 02/17/23 0917     02/14/23 2200  metroNIDAZOLE (FLAGYL) tablet 500 mg  Status:  Discontinued        500 mg Oral Every 12 hours 02/14/23 1412 02/17/23 0917   02/14/23 2000  cefTRIAXone (ROCEPHIN) 2 g in sodium chloride 0.9 % 100 mL IVPB  Status:  Discontinued        2 g 200 mL/hr over 30 Minutes Intravenous Every 24 hours 02/14/23 1410 02/17/23 0917   02/14/23 1500  azithromycin (ZITHROMAX) tablet 500 mg        500 mg Oral Daily 02/14/23 1410     02/12/23 0200  vancomycin (VANCOREADY) IVPB 1500 mg/300 mL  Status:  Discontinued        1,500 mg 150 mL/hr over 120 Minutes Intravenous Every 12 hours 02/11/23 1301 02/14/23 1410   02/11/23 1430  vancomycin (VANCOCIN) 750 mg in sodium chloride 0.9 % 250 mL IVPB  Status:  Discontinued        750 mg 265 mL/hr over 60 Minutes Intravenous  Once 02/11/23 1309 02/11/23 1354   02/11/23 1430  vancomycin (VANCOCIN) 750 mg in sodium chloride 0.9 % 250 mL IVPB        750 mg 250 mL/hr over 60 Minutes Intravenous  Once 02/11/23 1354 02/11/23 1543   02/11/23 1400  vancomycin (VANCOREADY) IVPB 750 mg/150 mL  Status:  Discontinued        750 mg 150 mL/hr over 60 Minutes Intravenous  Once 02/11/23 1301 02/11/23 1309   02/10/23 2130  metroNIDAZOLE (FLAGYL) IVPB 500 mg  Status:  Discontinued        500 mg 100 mL/hr over 60 Minutes Intravenous Every 12 hours 02/10/23 1434 02/14/23 1410   02/08/23 1730  metroNIDAZOLE (FLAGYL) IVPB 500 mg  Status:  Discontinued        500 mg 100 mL/hr over 60 Minutes Intravenous Every 8 hours 02/08/23 1637 02/10/23 1434   02/08/23 0400  vancomycin (VANCOREADY) IVPB 750 mg/150 mL  Status:  Discontinued        750 mg 150 mL/hr over 60 Minutes Intravenous Every 12 hours 02/08/23 0324 02/08/23 0328   02/08/23 0400  ceFEPIme (MAXIPIME) 2 g in sodium chloride 0.9 %  100 mL IVPB  Status:  Discontinued        2 g 200 mL/hr over 30 Minutes Intravenous Every 8 hours 02/08/23 0324 02/14/23 1410   02/08/23 0400  vancomycin (VANCOCIN) 750 mg in sodium chloride 0.9 % 250 mL IVPB  Status:  Discontinued        750 mg 250 mL/hr over 60 Minutes Intravenous Every 12 hours 02/08/23 0328 02/11/23 1258       Objective: Vitals:   02/20/23 0419 02/20/23 0732  BP:  112/61  Pulse:    Resp:    Temp: 99.6 F (37.6 C) 99.2 F (37.3 C)  SpO2:  93%    Intake/Output Summary (Last 24 hours) at 02/20/2023 1133 Last data filed at 02/20/2023 0740 Gross per 24 hour  Intake 1623.41 ml  Output 590 ml  Net 1033.41 ml   Filed Weights   02/18/23 0345 02/19/23 0422 02/20/23 0530  Weight: 73.8 kg 71.6 kg 73.6 kg   Body mass index is 22.01 kg/m.  Physical Exam:  Gen: Awake, Alert, Oriented X 3,  HEENT: no JVD CVS: S1-S2, regular rhythm Lungs: Scattered rhonchi on the left, chest tube Abdomen: Soft, nontender, bowel sounds present Extremities: No edema  Data Review: I have personally reviewed the following laboratory data and studies,  CBC: Recent Labs  Lab 02/17/23 0340 02/18/23 0401 02/18/23 1425 02/19/23 0440 02/20/23 0320  WBC 12.9* 9.6 11.8* 12.8* 12.1*  HGB 9.6* 7.8* 8.8* 8.6* 8.5*  HCT 30.1* 24.1* 27.2* 26.9* 26.4*  MCV 93.2 94.1 93.8 94.1 93.6  PLT 277 264 327 338 345   Basic Metabolic Panel: Recent Labs  Lab 02/14/23 0217 02/15/23 0440 02/16/23 0225 02/17/23 0848 02/18/23 0401 02/19/23 0440 02/20/23 0320  NA 132* 134* 130* 134* 134* 134* 132*  K 3.9 4.3 4.0 3.8 3.0* 4.0 4.3  CL 103 105 99 104 110 102 100  CO2 20* 23 21* 23 21* 24 26  GLUCOSE 109* 121* 131* 105* 120* 194* 187*  BUN 5* 9 11 11 8  7* 11  CREATININE 0.68 0.68 0.67 0.68 0.46* 0.59* 0.73  CALCIUM 7.7* 8.0* 7.9* 7.8* 6.6* 8.1* 7.8*  MG 1.8 1.9 2.0 1.7  --  1.7  --   PHOS 2.7  --  2.9  --   --   --   --    Liver Function Tests: No results for input(s): "AST", "ALT",  "ALKPHOS", "BILITOT", "PROT", "ALBUMIN" in the last 168 hours.  No results for input(s): "LIPASE", "AMYLASE" in the last 168 hours.  No results for input(s): "AMMONIA" in the last 168 hours. Cardiac Enzymes: No results for input(s): "CKTOTAL", "CKMB", "CKMBINDEX", "TROPONINI" in the last 168 hours. BNP (last 3 results) No results for input(s): "BNP" in the last 8760 hours.  ProBNP (last 3 results) No results for input(s): "PROBNP" in the last 8760 hours.  CBG: No results for input(s): "GLUCAP" in the last 168 hours. Recent Results (from the past 240 hours)  Body fluid culture w Gram Stain     Status: None   Collection Time: 02/17/23  9:04 AM   Specimen: Pleura; Body Fluid  Result Value Ref Range Status   Specimen Description PLEURAL  Final   Special Requests body fluid  Final   Gram Stain   Final    FEW WBC PRESENT, PREDOMINANTLY PMN NO ORGANISMS SEEN    Culture   Final    NO GROWTH 3 DAYS Performed at [facility], [address], [place], [place] [address]    Report Status 02/20/2023 FINAL  Final     Studies: DG CHEST PORT 1 VIEW Result Date: 02/20/2023 CLINICAL DATA:  Shortness of breath, chest tube in place. EXAM: PORTABLE CHEST 1 VIEW COMPARISON:  February 19, 2023. FINDINGS: Stable cardiomediastinal silhouette. Left-sided chest tube is unchanged in position. Stable probable left basilar hydropneumothorax. Stable right upper lobe opacity is noted concerning for possible pneumonia. Bony thorax is unremarkable. IMPRESSION: Stable position of left-sided chest tube with stable probable left basilar hydropneumothorax. Stable right upper lobe opacity concerning for possible pneumonia. Electronically Signed   By: Lupita Raider M.D.   On: 02/20/2023 11:24   DG CHEST PORT 1 VIEW Result Date: 02/19/2023 CLINICAL DATA:  63 year old male with history of chest tube. EXAM: PORTABLE CHEST 1 VIEW COMPARISON:  Chest x-ray 02/18/2023. FINDINGS: Small bore left-sided chest tube with tip  projecting over the mid left hemithorax, similar to the prior study. Increasing lucency in the mid to lower left hemithorax concerning for probable loculated pneumothorax. There  is also a fluid level in this region suggesting hydropneumothorax. Areas of interstitial prominence, peribronchial cuffing and patchy multifocal airspace disease most evident in the mid to upper lungs, slightly increased compared to the prior study. No right pleural effusion. No right pneumothorax. No evidence of pulmonary edema. Heart size appears normal. Upper mediastinal contours are within normal limits. IMPRESSION: 1. Increasing lucency in the mid to lower left hemithorax concerning for loculated hydropneumothorax, which has increased despite the presence of a left-sided chest tube. 2. Slight interval worsening of aeration in the mid to upper lungs bilaterally, concerning for worsening multifocal pneumonia. Electronically Signed   By: Trudie Reed M.D.   On: 02/19/2023 08:50   DG CHEST PORT 1 VIEW Result Date: 02/18/2023 CLINICAL DATA:  Chest tube EXAM: PORTABLE CHEST 1 VIEW COMPARISON:  X-ray 02/17/2023 and older FINDINGS: Stable pigtail catheter at the left lung base. Lateral small pneumothorax may be slightly larger today but this could be technical. Recommend continued follow-up. Adjacent left mid lower lung parenchymal opacities are again seen with component of pleural fluid. Subtle right upper lung opacity is slightly more apparent today. No right-sided pneumothorax or effusion. Stable enlarged cardiopericardial silhouette. Stable left-sided PICC. Overlapping cardiac leads. IMPRESSION: Left-sided pigtail catheter with a lateral pneumothorax. The pneumothorax may be slightly larger today. Recommend continued follow-up. Persistent left lung opacity with left effusion. Slight increasing right mid upper lung opacity. Recommend follow-up Electronically Signed   By: Karen Kays M.D.   On: 02/18/2023 16:19      Zannie Cove, MD  [facility] 02/20/2023  If 7PM-7AM, please contact night-coverage

## 2023-02-20 NOTE — Progress Notes (Signed)
ID brief note  Tmax 101.1, suspect pulmonary source Oxygen requirement increased to 6L     Latest Ref Rng & Units 02/20/2023    3:20 AM 02/19/2023    4:40 AM 02/18/2023    2:25 PM  CBC  WBC 4.0 - 10.5 K/uL 12.1  12.8  11.8   Hemoglobin 13.0 - 17.0 g/dL 8.5  8.6  8.8   Hematocrit 39.0 - 52.0 % 26.4  26.9  27.2   Platelets 150 - 400 K/uL 345  338  327       Latest Ref Rng & Units 02/20/2023    3:20 AM 02/19/2023    4:40 AM 02/18/2023    4:01 AM  CMP  Glucose 70 - 99 mg/dL 409  811  914   BUN 8 - 23 mg/dL 11  7  8    Creatinine 0.61 - 1.24 mg/dL 7.82  9.56  2.13   Sodium 135 - 145 mmol/L 132  134  134   Potassium 3.5 - 5.1 mmol/L 4.3  4.0  3.0   Chloride 98 - 111 mmol/L 100  102  110   CO2 22 - 32 mmol/L 26  24  21    Calcium 8.9 - 10.3 mg/dL 7.8  8.1  6.6    Results for orders placed or performed during the hospital encounter of 02/08/23  MRSA Next Gen by PCR, Nasal     Status: Abnormal   Collection Time: 02/08/23  9:34 AM   Specimen: Nasal Mucosa; Nasal Swab  Result Value Ref Range Status   MRSA by PCR Next Gen DETECTED (A) NOT DETECTED Final    Comment: RESULT CALLED TO, READ BACK BY AND VERIFIED WITH: RN christine h. [id number] [id number] fcp (NOTE) The GeneXpert MRSA Assay (FDA approved for NASAL specimens only), is one component of a comprehensive MRSA colonization surveillance program. It is not intended to diagnose MRSA infection nor to guide or monitor treatment for MRSA infections. Test performance is not FDA approved in patients less than 39 years old. Performed at [facility], [address], [place], [place] [address]   Culture, blood (Routine X 2) w Reflex to ID Panel     Status: None   Collection Time: 02/08/23  9:46 AM   Specimen: BLOOD LEFT HAND  Result Value Ref Range Status   Specimen Description BLOOD LEFT HAND  Final   Special Requests   Final    BOTTLES DRAWN AEROBIC AND ANAEROBIC Blood Culture results may not be optimal due to an inadequate volume of blood  received in culture bottles   Culture   Final    NO GROWTH 5 DAYS Performed at [facility], [address] [place], [place] [address]    Report Status 02/13/2023 FINAL  Final  Culture, blood (Routine X 2) w Reflex to ID Panel     Status: None   Collection Time: 02/08/23  9:54 AM   Specimen: BLOOD LEFT HAND  Result Value Ref Range Status   Specimen Description BLOOD LEFT HAND  Final   Special Requests   Final    BOTTLES DRAWN AEROBIC AND ANAEROBIC Blood Culture results may not be optimal due to an inadequate volume of blood received in culture bottles   Culture   Final    NO GROWTH 5 DAYS Performed at [facility], [address], [place], [place] [address]    Report Status 02/13/2023 FINAL  Final  Body fluid culture w Gram Stain  Status: None   Collection Time: 02/08/23 11:04 AM   Specimen: Pleural Fluid  Result Value Ref Range Status   Specimen Description PLEURAL  Final   Special Requests NONE  Final   Gram Stain   Final    ABUNDANT WBC PRESENT, PREDOMINANTLY PMN ABUNDANT GRAM NEGATIVE RODS ABUNDANT GRAM POSITIVE COCCI IN PAIRS IN CHAINS Gram Stain Report Called to,Read Back By and Verified With: C. HICKLING [id number] @ 1519 FH    Culture   Final    MODERATE CAMPYLOBACTER SPECIES MODERATE PEPTOSTREPTOCOCCUS MICROS WITHIN MIXED ANAEROBIC ORGANISMS Performed at [facility], [address], [place], [place] [address]    Report Status 02/12/2023 FINAL  Final  Fungus Culture With Stain     Status: None (Preliminary result)   Collection Time: 02/08/23 11:04 AM   Specimen: Pleural Fluid  Result Value Ref Range Status   Fungus Stain Final report  Final    Comment: (NOTE) Performed At: [facility] [address] [place], [place] [id number] Jolene Schimke MD GN:[id number]    Fungus (Mycology) Culture PENDING  Incomplete   Fungal Source PLEURAL  Final    Comment: Performed at [facility], [address], [place], [place] [address]  Fungus  Culture Result     Status: None   Collection Time: 02/08/23 11:04 AM  Result Value Ref Range Status   Result 1 Comment  Final    Comment: (NOTE) KOH/Calcofluor preparation:  no fungus observed. Performed At: [facility] [address] [place], [place] [id number] Jolene Schimke MD GM:[id number]   Aerobic/Anaerobic Culture w Gram Stain (surgical/deep wound)     Status: Abnormal   Collection Time: 02/10/23  9:23 AM   Specimen: Abscess  Result Value Ref Range Status   Specimen Description ABSCESS  Final   Special Requests chest  Final   Gram Stain   Final    ABUNDANT WBC PRESENT, PREDOMINANTLY PMN ABUNDANT GRAM POSITIVE COCCI RARE GRAM POSITIVE RODS    Culture (A)  Final    STREPTOCOCCUS CONSTELLATUS NO ANAEROBES ISOLATED Performed at [facility], [address], [place], [place] [address]    Report Status 02/16/2023 FINAL  Final   Organism ID, Bacteria STREPTOCOCCUS CONSTELLATUS  Final      Susceptibility   Streptococcus constellatus - MIC*    PENICILLIN <=0.06 SENSITIVE Sensitive     CEFTRIAXONE <=0.12 SENSITIVE Sensitive     ERYTHROMYCIN <=0.12 SENSITIVE Sensitive     LEVOFLOXACIN <=0.25 SENSITIVE Sensitive     VANCOMYCIN 0.5 SENSITIVE Sensitive     * STREPTOCOCCUS CONSTELLATUS  Body fluid culture w Gram Stain     Status: None   Collection Time: 02/17/23  9:04 AM   Specimen: Pleura; Body Fluid  Result Value Ref Range Status   Specimen Description PLEURAL  Final   Special Requests body fluid  Final   Gram Stain   Final    FEW WBC PRESENT, PREDOMINANTLY PMN NO ORGANISMS SEEN    Culture   Final    NO GROWTH 3 DAYS Performed at [facility], [address], [place], [place] [address]    Report Status 02/20/2023 FINAL  Final   Unasyn changed to cefepime and metronidazole per PCCM. On azithromycin.   Patient has now agreed to VATS/decortication which will help in source control/fevers  Monitor CBC and CMP  Odette Fraction, MD Infectious  Disease Physician [facility] [address] [place], [place] [address]  Phone: [phone]  Fax: [phone]

## 2023-02-20 NOTE — Progress Notes (Signed)
PHARMACY - ANTICOAGULATION CONSULT NOTE  Pharmacy Consult for IV heparin Indication: atrial fibrillation  No Known Allergies  Patient Measurements: Height: 6' (182.9 cm) Weight: 71.6 kg (157 lb 13.6 oz) IBW/kg (Calculated) : 77.6 Heparin Dosing Weight: ~ 80 kg  Vital Signs: Temp: 100.3 F (37.9 C) (02/02 0312) Temp Source: Oral (02/02 0312) BP: 115/66 (02/02 0312) Pulse Rate: 103 (02/02 0312)  Labs: Recent Labs    02/18/23 0401 02/18/23 1425 02/19/23 0440 02/20/23 0320  HGB 7.8* 8.8* 8.6* 8.5*  HCT 24.1* 27.2* 26.9* 26.4*  PLT 264 327 338 345  HEPARINUNFRC  --   --   --  0.40  CREATININE 0.46*  --  0.59* 0.73    Estimated Creatinine Clearance: 97 mL/min (by C-G formula based on SCr of 0.73 mg/dL).  Assessment: 63 yo male with new onset afib, initially converted to NSR following placement of chest tube however patient had converted back to afib. Pharmacy asked to start anticoagulation with IV heparin.  No known anticoagulants PTA or hx bleeding.  Patient now completed therapy with fibrinolytics (last dose 1/29), ok to resume IV heparin for afib. Patient now reconsidering VATS/decortication, TCTS consult pending.  Heparin level 0.40 (therapeutic) on 2550 units/hr Hgb 8.6>8.5, Plts 345, SCr 0.59>0.73  Goal of Therapy:  Heparin level 0.3-0.7 units/ml Monitor platelets by anticoagulation protocol: Yes   Plan:  Continue heparin gtt at 2550 units/hr  Daily heparin level, CBC, s/s bleeding F/u TCTS consult - f/u when heparin needs to be held prior to surgery  Arabella Merles, PharmD. Clinical Pharmacist 02/20/2023 3:54 AM

## 2023-02-20 NOTE — Progress Notes (Signed)
NAME:  Mathew Wyatt, MRN:  [id number], DOB:  07-08-1960, LOS: 12 ADMISSION DATE:  02/08/2023, CONSULTATION DATE: 02/08/2023  REFERRING MD: Add, CHIEF COMPLAINT: Hypotension, atrial fibrillation, large empyema on left with hydropneumothorax  History of Present Illness:  63 year old male with past medical history of tobacco use, COPD who presented to [facility] on 1/20 with cough and shortness of breath. Reportedly having worsening symptoms over the preceding 2 weeks. He denied having fever, chills, chest pain, n/v/d. He was found to be tachypneic with significant leukocytosis to 35.7, lactifc 4.4. he was started on IV antibiotics. He had CT chest which demonstrated a large left-sided loculated hydropneumothorax concerning for empyema or abscess. He was transferred to [facility] on 1/21 initially to hospitalist for CT surgery consult and possible VATS. On 1/21 he developed atrial fibrillation with RVR and hypotension and was transferred to [facility] and admitted to ICU.   Pertinent  Medical History  History reviewed. No pertinent past medical history.   Significant Hospital Events: Including procedures, antibiotic start and stop dates in addition to other pertinent events   1/20: [facility] ED with SOB. CT with large left hydropneumo c/w empyema vs abscess.  1/21: TCTS defer VATS. ICU for afib rvr hypotension, started on amiodarone. CT placed with immediate 2L purulent output. Initial tube lytics with additional 300cc output. ECHO neg for vegetation, EF 45-50% 1/22: repeat CT chest today to eval for need of additional CT vs more lytics vs observation.  1/23 new chest tube placed by IR, cultures growing Campylobacter and Peptostreptococcus 1/26 dec'd pleural output but has persistent loculated fluid collection  1/27 AF w/ RVR. Treated w/ BB. Seen by ID; vanc stopped, switched to ceftriaxone, flagyl and azith for campylobacter. CT chest obtained. The anterior fluid collection has improved. The lateral effusion vol is  less but loculated air maintains. Now has Right layering effusion, and new patchy areas of GG changes. Some mild dec in left GG changes. Repeated round 4 pleural lytics (administered in right lateral tube)  1/28: lateral CT 120 net neg after accounting for TPA. AM cxr possible marginal Improvement in loculated ptx. 1/29 no significant change after 5 doses of pleural lytic therapy.  Discussed case again with thoracic surgery who will reevaluate for possible VATS.  Lateral chest tube became dislodged, removed. 1/30 s/p 7 doses of pleural lytics. ~150 mL output from chest tube overnight. 1/31 Essentially unchanged may be small improvement in rind and small improvement in pneumothorax  2/1 no issues overnight, 200 out of chest tube in last 24 hours 2/2 Output in last 24 hours 80 cc  Interim History / Subjective:  Febrile to 101.1 Increased O2 requirement to 2L to 6L Objective   Blood pressure 112/62, pulse (!) 103, temperature (!) 101.1 F (38.4 C), temperature source Oral, resp. rate 20, height 6' (1.829 m), weight 73.6 kg, SpO2 91%.        Intake/Output Summary (Last 24 hours) at 02/20/2023 1211 Last data filed at 02/20/2023 1100 Gross per 24 hour  Intake 1633.41 ml  Output 600 ml  Net 1033.41 ml   Filed Weights   02/18/23 0345 02/19/23 0422 02/20/23 0530  Weight: 73.8 kg 71.6 kg 73.6 kg   Physical Exam: General: Chronically ill-appearing, no acute distress HENT: [place], AT, OP clear, MMM Eyes: EOMI, no scleral icterus Respiratory: Diminished left air entry at base. Left chest tube in place.  No crackles, wheezing or rales Cardiovascular: RRR, -M/R/G, no JVD Extremities:-Edema,-tenderness Neuro: AAO x4, CNII-XII grossly intact  Imaging, labs  and test in EMR in the last 24 hours reviewed independently by me. Pertinent findings below: WBC 12.1  Resolved Problems   Sepsis  Af w/ RVR  Assessment & Plan:   Acute hypoxemic respiratory failure -  worsening Campylobacter/Peptostreptococcus/Streptococcus constellatus empyema Pneumothorax due to lung entrapment, lack of lung expansion S/p chest tube x 2, currently only one in place S/p intrapleural lytics x 7 doses. Last given 1/29 P: Routine chest tube care Continue chest tube to -40 cm suction CXR PRN Broaden to Cefepime and Flagyl Patient declined surgical intervention 1/30 but per discussion with Dr. Judeth Horn 1/31 he is now agreeable for VATS/decortication. Thoracic surgery notified by primary team over the weekend  Signature   Care Time: 35 min  Mechele Collin, M.D. [facility] Pulmonary/Critical Care Medicine 02/20/2023 3:18 PM   See Amion for personal pager For hours between 7 PM to 7 AM, please call Elink for urgent questions

## 2023-02-20 NOTE — Progress Notes (Signed)
PHARMACY - ANTICOAGULATION CONSULT NOTE  Pharmacy Consult for IV heparin Indication: atrial fibrillation  No Known Allergies  Patient Measurements: Height: 6' (182.9 cm) Weight: 73.6 kg (162 lb 4.1 oz) IBW/kg (Calculated) : 77.6 Heparin Dosing Weight: ~ 80 kg  Vital Signs: Temp: 101.1 F (38.4 C) (02/02 1136) Temp Source: Oral (02/02 1136) BP: 112/62 (02/02 1136) Pulse Rate: 103 (02/02 0312)  Labs: Recent Labs    02/18/23 0401 02/18/23 1425 02/19/23 0440 02/20/23 0320 02/20/23 1226  HGB 7.8* 8.8* 8.6* 8.5*  --   HCT 24.1* 27.2* 26.9* 26.4*  --   PLT 264 327 338 345  --   HEPARINUNFRC  --   --   --  0.40 0.57  CREATININE 0.46*  --  0.59* 0.73  --     Estimated Creatinine Clearance: 99.7 mL/min (by C-G formula based on SCr of 0.73 mg/dL).  Assessment: 63 yo male with new onset afib, initially converted to NSR following placement of chest tube however patient had converted back to afib. Pharmacy asked to start anticoagulation with IV heparin.  No known anticoagulants PTA or hx bleeding.  Patient now completed therapy with fibrinolytics (last dose 1/29), ok to resume IV heparin for afib. Patient now reconsidering VATS/decortication, TCTS consult pending.  Confirmatory heparin level 0.57 (therapeutic) on 2550 units/hr Hgb 8.6>8.5, Plts 345, SCr 0.59>0.73  Goal of Therapy:  Heparin level 0.3-0.7 units/ml Monitor platelets by anticoagulation protocol: Yes   Plan:  Continue heparin gtt at 2550 units/hr  Daily heparin level, CBC, s/s bleeding F/u TCTS consult - f/u when heparin needs to be held prior to surgery  Loralee Pacas, PharmD, BCPS Clinical Pharmacist 02/20/2023 1:12 PM

## 2023-02-20 NOTE — Plan of Care (Signed)
  Problem: Fluid Volume: Goal: Hemodynamic stability will improve Outcome: Not Progressing   Problem: Clinical Measurements: Goal: Diagnostic test results will improve Outcome: Not Progressing

## 2023-02-21 ENCOUNTER — Inpatient Hospital Stay (HOSPITAL_COMMUNITY): Payer: Medicaid Other

## 2023-02-21 DIAGNOSIS — J869 Pyothorax without fistula: Secondary | ICD-10-CM | POA: Diagnosis not present

## 2023-02-21 DIAGNOSIS — I5021 Acute systolic (congestive) heart failure: Secondary | ICD-10-CM | POA: Diagnosis not present

## 2023-02-21 DIAGNOSIS — I4891 Unspecified atrial fibrillation: Secondary | ICD-10-CM | POA: Diagnosis not present

## 2023-02-21 LAB — BASIC METABOLIC PANEL
Anion gap: 10 (ref 5–15)
BUN: 11 mg/dL (ref 8–23)
CO2: 22 mmol/L (ref 22–32)
Calcium: 6.7 mg/dL — ABNORMAL LOW (ref 8.9–10.3)
Chloride: 104 mmol/L (ref 98–111)
Creatinine, Ser: 0.5 mg/dL — ABNORMAL LOW (ref 0.61–1.24)
GFR, Estimated: >60 mL/min (ref >60)
Glucose, Bld: 112 mg/dL — ABNORMAL HIGH (ref 70–99)
Potassium: 3.1 mmol/L — ABNORMAL LOW (ref 3.5–5.1)
Sodium: 136 mmol/L (ref 135–145)

## 2023-02-21 LAB — PREPARE RBC (CROSSMATCH)

## 2023-02-21 LAB — CBC
HCT: 19.4 % — ABNORMAL LOW (ref 39.0–52.0)
HCT: 28.4 % — ABNORMAL LOW (ref 39.0–52.0)
Hemoglobin: 6 g/dL — CL (ref 13.0–17.0)
Hemoglobin: 9.2 g/dL — ABNORMAL LOW (ref 13.0–17.0)
MCH: 29.7 pg (ref 26.0–34.0)
MCH: 29.6 pg (ref 26.0–34.0)
MCHC: 30.9 g/dL (ref 30.0–36.0)
MCHC: 32.4 g/dL (ref 30.0–36.0)
MCV: 96 fL (ref 80.0–100.0)
MCV: 91.3 fL (ref 80.0–100.0)
Platelets: 241 10*3/uL (ref 150–400)
Platelets: 303 10*3/uL (ref 150–400)
RBC: 2.02 MIL/uL — ABNORMAL LOW (ref 4.22–5.81)
RBC: 3.11 MIL/uL — ABNORMAL LOW (ref 4.22–5.81)
RDW: 16.8 % — ABNORMAL HIGH (ref 11.5–15.5)
RDW: 17.5 % — ABNORMAL HIGH (ref 11.5–15.5)
WBC: 6.5 10*3/uL (ref 4.0–10.5)
WBC: 7.6 10*3/uL (ref 4.0–10.5)
nRBC: 0 % (ref 0.0–0.2)
nRBC: 0 % (ref 0.0–0.2)

## 2023-02-21 LAB — OCCULT BLOOD X 1 CARD TO LAB, STOOL: Fecal Occult Bld: NEGATIVE

## 2023-02-21 LAB — ABO/RH: ABO/RH(D): A POS

## 2023-02-21 LAB — HEPARIN LEVEL (UNFRACTIONATED): Heparin Unfractionated: 0.55 [IU]/mL (ref 0.30–0.70)

## 2023-02-21 MED ORDER — SODIUM CHLORIDE 0.9% IV SOLUTION: Freq: Once | INTRAVENOUS | Status: DC

## 2023-02-21 MED ORDER — ALBUMIN HUMAN 25 % IV SOLN
50.0000 g | INTRAVENOUS | Status: AC
  Administered 2023-02-21: 50 g via INTRAVENOUS
  Filled 2023-02-21: qty 200

## 2023-02-21 MED ORDER — POTASSIUM CHLORIDE CRYS ER 20 MEQ PO TBCR
40.0000 meq | EXTENDED_RELEASE_TABLET | Freq: Once | ORAL | Status: AC
  Administered 2023-02-21: 40 meq via ORAL
  Filled 2023-02-21: qty 2

## 2023-02-21 MED ORDER — SODIUM CHLORIDE 0.9 % IV BOLUS
1000.0000 mL | INTRAVENOUS | Status: AC
  Administered 2023-02-21: 1000 mL via INTRAVENOUS

## 2023-02-21 MED ORDER — SODIUM CHLORIDE 0.9 % IV SOLN: INTRAVENOUS | Status: DC

## 2023-02-21 NOTE — Progress Notes (Signed)
PROGRESS NOTE  Mathew Wyatt JYN:[id number] DOB: 1960-11-22 DOA: 02/08/2023 PCP: Patient, No Pcp Per   LOS: 13 days   Brief narrative:  Mathew Wyatt is a 63 y.o. male with past medical history of COPD and a smoker who had initially presented to [facility] hospital on 02/07/2023 with shortness of breath dyspnea on exertion with productive cough.  He was noted to be tachypneic with significant leukocytosis and elevated lactate.  Patient was started on IV antibiotic.  CT scan of the chest however was concerning for empyema versus abscess so he was transferred to [facility] on 02/08/2023 for further evaluation.  Patient however went into A-fib with RVR and hypotension and was admitted to ICU service.   1/21: TCTS defer VATS. ICU for afib rvr hypotension, started on amiodarone. CT placed with immediate 2L purulent output. Initial tube lytics with additional 300cc output. ECHO neg for vegetation, EF 45-50% 1/22: repeat CT chest 1/23 new chest tube placed by IR, cultures growing Campylobacter and Peptostreptococcus 1/26 dec'd pleural output but has persistent loculated fluid collection  1/27 AF w/ RVR. Treated w/ BB. Seen by ID; vanc stopped, switched to ceftriaxone, flagyl and azith for campylobacter. CT chest obtained. The anterior fluid collection has improved. The lateral effusion vol is less but loculated air maintains. Now has Right layering effusion, and new patchy areas of GG changes. Some mild dec in left GG changes. Repeated round 4 pleural lytics (administered in right lateral tube)  1/28: lateral CT 120 net neg  1/30 no significant change after 7 doses of pleural lytic therapy.  -1/30, seen by Dr. Cliffton Asters, patient refused VATS -1/31: Changed his mind, now agrees to surgery, TCTS notified -2/1: PCCM restarted Heparin gtt -2/3, hemoglobin down to 6.0, heparin held, transfusing, T CTS notified again  Subjective: -More pain, some shortness of breath yesterday and  weakness  Assessment/Plan:  Sepsis secondary to left empyema, pneumonia Acute hypoxic respiratory failure -Status post chest tube placement x2.  Second chest tube placement by IR 02/10/2023.  -Was on ceftriaxone, Flagyl and azithromycin, followed by Unasyn per ID then changed to cefepime/metronidazole per PCCM 2/1 -Orthopantogram with dental caries, unfortunately no inpatient dental coverage available now, would need outpatient follow-up -Pleural fluid cultures initially grew Campylobacter and Peptostreptococcus  -Repeat cultures with Streptococcus constellatus, pansensitive -CT surgery, pulmonary and ID following.    -Has completed 7 doses of fibrinolytic therapy without considerable improvement, T CTS consulted, seen by Dr. Cliffton Asters 1/30 patient refused VATS/decortication,  -1/31 now willing to have surgery, T CTS notified 1/31 -Dr. Lucilla Lame team notified again, he will discuss with patient -Pulmonary following, was restarted on IV heparin 2/1, will discontinue heparin again  New onset atrial fibrillation with RVR.   -Was on amiodarone drip, then changed to p.o., back on amiodarone gtt. again, continue metoprolol, RVR likely driven by empyema -we were holding heparin with empyema and ongoing fibrinolytic therapy -2/1 back on heparin  Acute on chronic anemia -Hemoglobin trending down, repeat chest x-ray, possibly has hemopneumothorax -Transfusing PRBC, hold heparin, defer anticoagulation in the setting of empyema, chest tube -Monitor hemoglobin  Acute systolic congestive heart failure with moderately reduced ejection fraction.  2D echocardiogram 45 to 50% with global hypokinesis. 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  Continue nebulizers.   insomnia anxiety.  On muscle relaxant Atarax Ambien and pain medication -Add Xanax as needed   DVT  prophylaxis: SCDs  disposition: Home pending resolution of empyema    Code Status:    Code Status: Full Code  Family Communication: None at bedside  Consultants: CT surgery PCCM Interventional radiology Cardiology  Procedures: Left-sided chest tube placement x 2. Fibrinolytic treatment x 3  Anti-infectives:  Rocephin Flagyl and Zithromax.  Anti-infectives (From admission, onward)    Start     Dose/Rate Route Frequency Ordered Stop   02/20/23 1615  metroNIDAZOLE (FLAGYL) tablet 500 mg        500 mg Oral Every 8 hours 02/20/23 1516     02/20/23 1615  ceFEPIme (MAXIPIME) 2 g in sodium chloride 0.9 % 100 mL IVPB        2 g 200 mL/hr over 30 Minutes Intravenous Every 8 hours 02/20/23 1519     02/17/23 2000  Ampicillin-Sulbactam (UNASYN) 3 g in sodium chloride 0.9 % 100 mL IVPB  Status:  Discontinued        3 g 200 mL/hr over 30 Minutes Intravenous Every 6 hours 02/17/23 0917 02/20/23 1516   02/14/23 2200  metroNIDAZOLE (FLAGYL) tablet 500 mg  Status:  Discontinued        500 mg Oral Every 12 hours 02/14/23 1412 02/17/23 0917   02/14/23 2000  cefTRIAXone (ROCEPHIN) 2 g in sodium chloride 0.9 % 100 mL IVPB  Status:  Discontinued        2 g 200 mL/hr over 30 Minutes Intravenous Every 24 hours 02/14/23 1410 02/17/23 0917   02/14/23 1500  azithromycin (ZITHROMAX) tablet 500 mg        500 mg Oral Daily 02/14/23 1410     02/12/23 0200  vancomycin (VANCOREADY) IVPB 1500 mg/300 mL  Status:  Discontinued        1,500 mg 150 mL/hr over 120 Minutes Intravenous Every 12 hours 02/11/23 1301 02/14/23 1410   02/11/23 1430  vancomycin (VANCOCIN) 750 mg in sodium chloride 0.9 % 250 mL IVPB  Status:  Discontinued        750 mg 265 mL/hr over 60 Minutes Intravenous  Once 02/11/23 1309 02/11/23 1354   02/11/23 1430  vancomycin (VANCOCIN) 750 mg in sodium chloride 0.9 % 250 mL IVPB        750 mg 250 mL/hr over 60 Minutes Intravenous  Once 02/11/23 1354 02/11/23 1543   02/11/23 1400  vancomycin  (VANCOREADY) IVPB 750 mg/150 mL  Status:  Discontinued        750 mg 150 mL/hr over 60 Minutes Intravenous  Once 02/11/23 1301 02/11/23 1309   02/10/23 2130  metroNIDAZOLE (FLAGYL) IVPB 500 mg  Status:  Discontinued        500 mg 100 mL/hr over 60 Minutes Intravenous Every 12 hours 02/10/23 1434 02/14/23 1410   02/08/23 1730  metroNIDAZOLE (FLAGYL) IVPB 500 mg  Status:  Discontinued        500 mg 100 mL/hr over 60 Minutes Intravenous Every 8 hours 02/08/23 1637 02/10/23 1434   02/08/23 0400  vancomycin (VANCOREADY) IVPB 750 mg/150 mL  Status:  Discontinued        750 mg 150 mL/hr over 60 Minutes Intravenous Every 12 hours 02/08/23 0324 02/08/23 0328   02/08/23 0400  ceFEPIme (MAXIPIME) 2 g in sodium chloride 0.9 % 100 mL IVPB  Status:  Discontinued        2 g 200 mL/hr over 30 Minutes Intravenous Every 8 hours 02/08/23 0324 02/14/23 1410   02/08/23 0400  vancomycin (VANCOCIN) 750 mg in sodium chloride 0.9 % 250 mL IVPB  Status:  Discontinued        750 mg 250 mL/hr over 60 Minutes Intravenous Every 12 hours 02/08/23 0328 02/11/23 1258       Objective: Vitals:   02/21/23 0845 02/21/23 1044  BP: (!) 97/53 (!) 93/43  Pulse: 89 83  Resp: 18 20  Temp: 98.7 F (37.1 C) 97.7 F (36.5 C)  SpO2:  91%    Intake/Output Summary (Last 24 hours) at 02/21/2023 1113 Last data filed at 02/21/2023 1111 Gross per 24 hour  Intake 2009.18 ml  Output 1150 ml  Net 859.18 ml   Filed Weights   02/19/23 0422 02/20/23 0530 02/21/23 0400  Weight: 71.6 kg 73.6 kg 74.4 kg   Body mass index is 22.25 kg/m.   Physical Exam:  Chronically ill male sitting up in bed, AAOx3 HEENT: No JVD CVS: S1-S2, regular rhythm, Lungs: Rhonchi on the left, chest tube Abdomen: Soft, nontender, bowel sounds present Extremities: No edema  Data Review: I have personally reviewed the following laboratory data and studies,  CBC: Recent Labs  Lab 02/18/23 0401 02/18/23 1425 02/19/23 0440 02/20/23 0320  02/21/23 0440  WBC 9.6 11.8* 12.8* 12.1* 6.5  HGB 7.8* 8.8* 8.6* 8.5* 6.0*  HCT 24.1* 27.2* 26.9* 26.4* 19.4*  MCV 94.1 93.8 94.1 93.6 96.0  PLT 264 327 338 345 241   Basic Metabolic Panel: Recent Labs  Lab 02/15/23 0440 02/16/23 0225 02/17/23 0848 02/18/23 0401 02/19/23 0440 02/20/23 0320 02/21/23 0440  NA 134* 130* 134* 134* 134* 132* 136  K 4.3 4.0 3.8 3.0* 4.0 4.3 3.1*  CL 105 99 104 110 102 100 104  CO2 23 21* 23 21* 24 26 22   GLUCOSE 121* 131* 105* 120* 194* 187* 112*  BUN 9 11 11 8  7* 11 11  CREATININE 0.68 0.67 0.68 0.46* 0.59* 0.73 0.50*  CALCIUM 8.0* 7.9* 7.8* 6.6* 8.1* 7.8* 6.7*  MG 1.9 2.0 1.7  --  1.7  --   --   PHOS  --  2.9  --   --   --   --   --    Liver Function Tests: No results for input(s): "AST", "ALT", "ALKPHOS", "BILITOT", "PROT", "ALBUMIN" in the last 168 hours.  No results for input(s): "LIPASE", "AMYLASE" in the last 168 hours.  No results for input(s): "AMMONIA" in the last 168 hours. Cardiac Enzymes: No results for input(s): "CKTOTAL", "CKMB", "CKMBINDEX", "TROPONINI" in the last 168 hours. BNP (last 3 results) No results for input(s): "BNP" in the last 8760 hours.  ProBNP (last 3 results) No results for input(s): "PROBNP" in the last 8760 hours.  CBG: Recent Labs  Lab 02/20/23 2352  GLUCAP 132*   Recent Results (from the past 240 hours)  Body fluid culture w Gram Stain     Status: None   Collection Time: 02/17/23  9:04 AM   Specimen: Pleura; Body Fluid  Result Value Ref Range Status   Specimen Description PLEURAL  Final   Special Requests body fluid  Final   Gram Stain   Final    FEW WBC PRESENT, PREDOMINANTLY PMN NO ORGANISMS SEEN    Culture   Final    NO GROWTH 3 DAYS Performed at [facility], [address], [place], [place] [address]    Report Status 02/20/2023 FINAL  Final  Respiratory (~20 pathogens) panel by PCR     Status: None   Collection Time: 02/20/23  4:42 PM   Specimen: Nasopharyngeal Swab;  Respiratory  Result Value  Ref Range Status   Adenovirus NOT DETECTED NOT DETECTED Final   Coronavirus 229E NOT DETECTED NOT DETECTED Final    Comment: (NOTE) The Coronavirus on the Respiratory Panel, DOES NOT test for the novel  Coronavirus (2019 nCoV)    Coronavirus HKU1 NOT DETECTED NOT DETECTED Final   Coronavirus NL63 NOT DETECTED NOT DETECTED Final   Coronavirus OC43 NOT DETECTED NOT DETECTED Final   Metapneumovirus NOT DETECTED NOT DETECTED Final   Rhinovirus / Enterovirus NOT DETECTED NOT DETECTED Final   Influenza A NOT DETECTED NOT DETECTED Final   Influenza B NOT DETECTED NOT DETECTED Final   Parainfluenza Virus 1 NOT DETECTED NOT DETECTED Final   Parainfluenza Virus 2 NOT DETECTED NOT DETECTED Final   Parainfluenza Virus 3 NOT DETECTED NOT DETECTED Final   Parainfluenza Virus 4 NOT DETECTED NOT DETECTED Final   Respiratory Syncytial Virus NOT DETECTED NOT DETECTED Final   Bordetella pertussis NOT DETECTED NOT DETECTED Final   Bordetella Parapertussis NOT DETECTED NOT DETECTED Final   Chlamydophila pneumoniae NOT DETECTED NOT DETECTED Final   Mycoplasma pneumoniae NOT DETECTED NOT DETECTED Final    Comment: Performed at [facility], [address] [place], [place] [address]     Studies: DG CHEST PORT 1 VIEW Result Date: 02/21/2023 CLINICAL DATA:  Left-sided chest tube EXAM: PORTABLE CHEST 1 VIEW COMPARISON:  02/20/2023 FINDINGS: Left pleural drain again noted with persistent loculated left-sided hydropneumothorax. Consolidative airspace disease in the left mid lung is similar to prior. There is persistent diffuse interstitial and patchy airspace disease in the right upper lung. The cardio pericardial silhouette is enlarged. Left PICC line tip overlies the mid SVC level. Telemetry leads overlie the chest. IMPRESSION: 1. No substantial change. 2. Persistent loculated left-sided hydropneumothorax with left pleural drain in place. 3. Persistent consolidative airspace disease  in the left mid lung. 4. Persistent diffuse interstitial and patchy airspace disease in the right upper lung. Electronically Signed   By: Kennith Center M.D.   On: 02/21/2023 10:11   DG CHEST PORT 1 VIEW Result Date: 02/20/2023 CLINICAL DATA:  Shortness of breath, chest tube in place. EXAM: PORTABLE CHEST 1 VIEW COMPARISON:  February 19, 2023. FINDINGS: Stable cardiomediastinal silhouette. Left-sided chest tube is unchanged in position. Stable probable left basilar hydropneumothorax. Stable right upper lobe opacity is noted concerning for possible pneumonia. Bony thorax is unremarkable. IMPRESSION: Stable position of left-sided chest tube with stable probable left basilar hydropneumothorax. Stable right upper lobe opacity concerning for possible pneumonia. Electronically Signed   By: Lupita Raider M.D.   On: 02/20/2023 11:24      Zannie Cove, MD  [facility] 02/21/2023  If 7PM-7AM, please contact night-coverage

## 2023-02-21 NOTE — Progress Notes (Signed)
   02/21/23 0009 02/21/23 0015 02/21/23 0030  Vitals  BP (!) 86/53 (!) 87/48 (!) 73/49  MAP (mmHg) (!) 64 (!) 61 (!) 58  Pulse Rate  --  87  --   ECG Heart Rate 88 87  --   Resp 19 17  --   MEWS COLOR  MEWS Score Color Green Green Yellow  Oxygen Therapy  SpO2  --   --   --     02/21/23 0043 02/21/23 0100  Vitals  BP (!) 89/53 (!) 87/57  MAP (mmHg) 65 67  Pulse Rate 84 81  ECG Heart Rate 84 83  Resp 20 17  MEWS COLOR  MEWS Score Color Green Green  Oxygen Therapy  SpO2  --  97 %   Pt BP  is getting low. Dr Janalyn Shy was paged.  Amio drip was on hold at this time. Has been NSR with HR 80s.  Asymptomatic. See orders.

## 2023-02-21 NOTE — Progress Notes (Signed)
In sinus rhythm. Discussed with primary team attending Dr. Jomarie Longs. Agree with ongoing management of PAF, triggered by sepsis/empyema. We will sign off from daily rounding, but will be available for any questions.  Elder Negus, MD

## 2023-02-21 NOTE — Plan of Care (Signed)
  Problem: Coping: Goal: Level of anxiety will decrease Outcome: Progressing   Problem: Elimination: Goal: Will not experience complications related to bowel motility Outcome: Progressing Goal: Will not experience complications related to urinary retention Outcome: Progressing   Problem: Safety: Goal: Ability to remain free from injury will improve Outcome: Progressing   Problem: Skin Integrity: Goal: Risk for impaired skin integrity will decrease Outcome: Progressing   

## 2023-02-21 NOTE — Progress Notes (Signed)
PT Cancellation Note  Patient Details Name: Mathew Wyatt MRN: [id number] DOB: [birth year]   Cancelled Treatment:    Reason Eval/Treat Not Completed: Patient not medically ready. Pt with drop in hemoglobin from 8.2 to sick. Pt with soft BP and receiving blood transfusion. Acute PT to return as able to progress mobility.  Lewis Shock, PT, DPT Acute Rehabilitation Services Secure chat preferred Office #: [phone]    Iona Hansen 02/21/2023, 1:30 PM

## 2023-02-21 NOTE — Progress Notes (Addendum)
RN reported that patient blood pressure is soft systolic 77-80 and diastolic around 60s.  Patient also has low-grade temperature. RN reported that patient is still having A-fib RVR. Per chart review patient has been admitted for sepsis secondary to empyema pneumonia and acute hypoxic respiratory failure also patient has new onset of A-fib RVR in the setting of sepsis. RN reported that patient has been converted to sinus rhythm amiodarone drip on hold.  Due to persistent hypotension giving 1 L of NS bolus, albumin 50 g and plan to continue NS 100 cc/h in setting of hypotension.   Tereasa Coop, MD [facility] 02/21/2023, 12:50 AM     Acute on chronic anemia -RN reported that 5:13 AM that CBC showing hemoglobin dropped 8.5 to 6.  Patient does not have any active site of bleeding.  BMP also showing low potassium 3.1. - Informed the RN to hold the heparin drip and will give 2 units of blood transfusion.  Repeat H&H check around 11 AM.   Tereasa Coop, MD [facility] 02/21/2023, 5:14 AM

## 2023-02-21 NOTE — Progress Notes (Signed)
[facility]  Date of Admission:  02/08/2023     Total days of antibiotics 14         ASSESSMENT:  Mathew Wyatt continues to have fever over the 48 hours which is likely related to source control of infection in the setting of empyema. Now agreeable to VATS and awaiting CVTS evaluation. Continue current dose of cefepime and metronidazole and will monitor fever curve. Blood cultures from earlier today are pending. Chest tube management per PCCM and remaining medical and supportive care per Internal Medicine.   PLAN:  Continue current dose of cefepime and metronidazole.  Await further evaluation by CVTS for VATS. Monitor fever curve.  Chest tube management per PCCM. Remaining medical and supportive care per Internal Medicine.   Principal Problem:   Sepsis due to pneumonia [facility]) Active Problems:   Empyema (HCC)   Sepsis associated hypotension (HCC)   Need for management of chest tube   Hydropneumothorax   Pneumonia of left lower lobe due to infectious organism   Pressure injury of skin   Atrial fibrillation with rapid ventricular response (HCC)   Acute systolic heart failure (HCC)    sodium chloride   Intravenous Once   azithromycin  500 mg Oral Daily   Chlorhexidine Gluconate Cloth  6 each Topical Q0600   feeding supplement  237 mL Oral BID BM   Gerhardt's butt cream   Topical TID   lidocaine  5 mL Intradermal Once   melatonin  5 mg Oral QHS   methocarbamol (ROBAXIN) injection  500 mg Intravenous Q8H   Or   methocarbamol  500 mg Oral Q8H   metoprolol tartrate  12.5 mg Oral BID   metroNIDAZOLE  500 mg Oral Q8H   mupirocin ointment   Nasal BID   nicotine  21 mg Transdermal Daily   oxyCODONE  10 mg Oral Q6H   pantoprazole  40 mg Oral Daily   polyethylene glycol  17 g Oral Daily   sodium chloride flush  10 mL Intrapleural Q8H   sodium chloride flush  10-40 mL Intracatheter Q12H   spironolactone  12.5 mg Oral Daily    SUBJECTIVE:  Febrile  over the past 48 hours and improving WBC count. Having increased coughing and has had some loose stools otherwise tolerating antibiotics. Receiving blood transfusion.   No Known Allergies   Review of Systems: Review of Systems  Constitutional:  Positive for fever. Negative for chills and weight loss.  Respiratory:  Positive for cough and shortness of breath. Negative for wheezing.   Cardiovascular:  Negative for chest pain and leg swelling.  Gastrointestinal:  Negative for abdominal pain, constipation, diarrhea, nausea and vomiting.  Skin:  Negative for rash.      OBJECTIVE: Vitals:   02/21/23 0811 02/21/23 0845 02/21/23 1044 02/21/23 1115  BP: (!) 98/51 (!) 97/53 (!) 93/43 (!) 110/58  Pulse: 90 89 83 86  Resp: 20 18 20 20   Temp: 98.6 F (37 C) 98.7 F (37.1 C) 97.7 F (36.5 C) 97.7 F (36.5 C)  TempSrc: Oral  Oral   SpO2: 94%  91%   Weight:      Height:       Body mass index is 22.25 kg/m.  Physical Exam Constitutional:      General: He is not in acute distress.    Appearance: He is well-developed.  Cardiovascular:     Rate and Rhythm: Normal rate and regular rhythm.     Heart sounds: Normal  heart sounds.  Pulmonary:     Effort: Pulmonary effort is normal.     Breath sounds: Rhonchi present.  Skin:    General: Skin is warm and dry.  Neurological:     Mental Status: He is alert.  Psychiatric:        Mood and Affect: Mood normal.     Lab Results Lab Results  Component Value Date   WBC 6.5 02/21/2023   HGB 6.0 (LL) 02/21/2023   HCT 19.4 (L) 02/21/2023   MCV 96.0 02/21/2023   PLT 241 02/21/2023    Lab Results  Component Value Date   CREATININE 0.50 (L) 02/21/2023   BUN 11 02/21/2023   NA 136 02/21/2023   K 3.1 (L) 02/21/2023   CL 104 02/21/2023   CO2 22 02/21/2023    Lab Results  Component Value Date   ALT 26 02/09/2023   AST 44 (H) 02/09/2023   ALKPHOS 117 02/09/2023   BILITOT 1.2 02/09/2023     Microbiology: Recent Results (from the past  240 hours)  Body fluid culture w Gram Stain     Status: None   Collection Time: 02/17/23  9:04 AM   Specimen: Pleura; Body Fluid  Result Value Ref Range Status   Specimen Description PLEURAL  Final   Special Requests body fluid  Final   Gram Stain   Final    FEW WBC PRESENT, PREDOMINANTLY PMN NO ORGANISMS SEEN    Culture   Final    NO GROWTH 3 DAYS Performed at [facility], [address] [place], [place] [address]    Report Status 02/20/2023 FINAL  Final  Respiratory (~20 pathogens) panel by PCR     Status: None   Collection Time: 02/20/23  4:42 PM   Specimen: Nasopharyngeal Swab; Respiratory  Result Value Ref Range Status   Adenovirus NOT DETECTED NOT DETECTED Final   Coronavirus 229E NOT DETECTED NOT DETECTED Final    Comment: (NOTE) The Coronavirus on the Respiratory Panel, DOES NOT test for the novel  Coronavirus (2019 nCoV)    Coronavirus HKU1 NOT DETECTED NOT DETECTED Final   Coronavirus NL63 NOT DETECTED NOT DETECTED Final   Coronavirus OC43 NOT DETECTED NOT DETECTED Final   Metapneumovirus NOT DETECTED NOT DETECTED Final   Rhinovirus / Enterovirus NOT DETECTED NOT DETECTED Final   Influenza A NOT DETECTED NOT DETECTED Final   Influenza B NOT DETECTED NOT DETECTED Final   Parainfluenza Virus 1 NOT DETECTED NOT DETECTED Final   Parainfluenza Virus 2 NOT DETECTED NOT DETECTED Final   Parainfluenza Virus 3 NOT DETECTED NOT DETECTED Final   Parainfluenza Virus 4 NOT DETECTED NOT DETECTED Final   Respiratory Syncytial Virus NOT DETECTED NOT DETECTED Final   Bordetella pertussis NOT DETECTED NOT DETECTED Final   Bordetella Parapertussis NOT DETECTED NOT DETECTED Final   Chlamydophila pneumoniae NOT DETECTED NOT DETECTED Final   Mycoplasma pneumoniae NOT DETECTED NOT DETECTED Final    Comment: Performed at [facility], [address] [place], [place] [address]    I have personally spent 22 minutes involved in face-to-face and non-face-to-face activities  for this patient on the day of the visit. Professional time spent includes the following activities: Preparing to see the patient (review of tests), Obtaining and/or reviewing separately obtained history (admission/discharge record), Performing a medically appropriate examination and/or evaluation , Ordering medications/tests/procedures, referring and communicating with other health care professionals, Documenting clinical information in the EMR, Independently interpreting results (not separately reported), Communicating results  to the patient/family/caregiver, Counseling and educating the patient/family/caregiver and Care coordination (not separately reported).    Marcos Eke, NP [facility]  02/21/2023  11:27 AM

## 2023-02-21 NOTE — Progress Notes (Signed)
NAME:  Mathew Wyatt, MRN:  [id number], DOB:  May 12, 1960, LOS: 13 ADMISSION DATE:  02/08/2023, CONSULTATION DATE: 02/08/2023  REFERRING MD: Add, CHIEF COMPLAINT: Hypotension, atrial fibrillation, large empyema on left with hydropneumothorax  History of Present Illness:  63 year old male with past medical history of tobacco use, COPD who presented to [facility] on 1/20 with cough and shortness of breath. Reportedly having worsening symptoms over the preceding 2 weeks. He denied having fever, chills, chest pain, n/v/d. He was found to be tachypneic with significant leukocytosis to 35.7, lactifc 4.4. he was started on IV antibiotics. He had CT chest which demonstrated a large left-sided loculated hydropneumothorax concerning for empyema or abscess. He was transferred to [facility] on 1/21 initially to hospitalist for CT surgery consult and possible VATS. On 1/21 he developed atrial fibrillation with RVR and hypotension and was transferred to [facility] and admitted to ICU.   Pertinent  Medical History  History reviewed. No pertinent past medical history.   Significant Hospital Events: Including procedures, antibiotic start and stop dates in addition to other pertinent events   1/20: [facility] ED with SOB. CT with large left hydropneumo c/w empyema vs abscess.  1/21: TCTS defer VATS. ICU for afib rvr hypotension, started on amiodarone. CT placed with immediate 2L purulent output. Initial tube lytics with additional 300cc output. ECHO neg for vegetation, EF 45-50% 1/22: repeat CT chest today to eval for need of additional CT vs more lytics vs observation.  1/23 new chest tube placed by IR, cultures growing Campylobacter and Peptostreptococcus 1/26 dec'd pleural output but has persistent loculated fluid collection  1/27 AF w/ RVR. Treated w/ BB. Seen by ID; vanc stopped, switched to ceftriaxone, flagyl and azith for campylobacter. CT chest obtained. The anterior fluid collection has improved. The lateral effusion vol is  less but loculated air maintains. Now has Right layering effusion, and new patchy areas of GG changes. Some mild dec in left GG changes. Repeated round 4 pleural lytics (administered in right lateral tube)  1/28: lateral CT 120 net neg after accounting for TPA. AM cxr possible marginal Improvement in loculated ptx. 1/29 no significant change after 5 doses of pleural lytic therapy.  Discussed case again with thoracic surgery who will reevaluate for possible VATS.  Lateral chest tube became dislodged, removed. 1/30 s/p 7 doses of pleural lytics. ~150 mL output from chest tube overnight. 1/31 Essentially unchanged may be small improvement in rind and small improvement in pneumothorax  2/1 no issues overnight, 200 out of chest tube in last 24 hours 2/2 Output in last 24 hours 80 cc  Interim History / Subjective:  On 7-8L O2 [employer]. Afebrile Minimal left chest tube output Heparin restarted for afib however overnight Hg dropped from 8.5>6.0 and heparin held. PRBC x 2 ordered Objective   Blood pressure 103/63, pulse 83, temperature 98 F (36.7 C), temperature source Oral, resp. rate 20, height 6' (1.829 m), weight 74.4 kg, SpO2 94%.        Intake/Output Summary (Last 24 hours) at 02/21/2023 1402 Last data filed at 02/21/2023 1329 Gross per 24 hour  Intake 2493.18 ml  Output 1200 ml  Net 1293.18 ml   Filed Weights   02/19/23 0422 02/20/23 0530 02/21/23 0400  Weight: 71.6 kg 73.6 kg 74.4 kg   Neuro: AAO x4, CNII-XII grossly intact  Physical Exam: General: Chronically ill-appearing, no acute distress HENT: [place], AT, OP clear, MMM Eyes: EOMI, no scleral icterus Respiratory: Diminished left air entry to auscultation .  No crackles,  wheezing or rales. Left chest tube output Cardiovascular: RRR, -M/R/G, no JVD Extremities:-Edema,-tenderness Neuro: AAO x4, CNII-XII grossly intact  WBC normalized Hg 8.5>6.0  Resolved Problems   Sepsis  Af w/ RVR  Assessment & Plan:   Acute hypoxemic  respiratory failure - worsening Sepsis secondary to Campylobacter/Peptostreptococcus/Streptococcus constellatus empyema Pneumothorax due to lung entrapment, lack of lung expansion S/p chest tube x 2, currently only one in place S/p intrapleural lytics x 7 doses. Last given 1/29 P: --Routine chest tube care --Continue chest tube to -40 cm H20 suction --CXR PRN --Continue Cefepime and Flagyl. ID following --Patient declined surgical intervention 1/30 but per discussion with Dr. Judeth Horn 1/31 he is now agreeable for VATS/decortication given recent clinical decline --Discussed case with TCT. Awaiting surgical team recommendations  Acute blood loss anemia Hold on systemic anticoagulation in setting of recent intrapleural lytic use and recent hemoglobin drop  Atrial fibrillation As above  Signature   Care Time: 35 min  Mechele Collin, M.D. [facility] Pulmonary/Critical Care Medicine 02/21/2023 2:11 PM   See Amion for personal pager For hours between 7 PM to 7 AM, please call Elink for urgent questions

## 2023-02-21 NOTE — Progress Notes (Addendum)
HGB is 6. From 8.5.  no output on chest tube. No active bleeding noted.  Dr Janalyn Shy was notified. Oredered by DR as the Ffng. -2 Units PRBC -type and screen- sent -holding Heparin drip- RPH was notified. -occult  stool.- still for collection. Ongoing Plan Of care.   - to recheck H&H 2 hrs after BT. Will pass on day shift.

## 2023-02-21 NOTE — Progress Notes (Signed)
Pharmacy Antibiotic Note  Mathew Wyatt is a 63 y.o. male  with PNA and empyema.  Pharmacy has been consulted for cefepime dosing (he is also on Flagyl and azithromycin)  =-abscess cultures with strep constellatus, pleural fluid cultures with campylobacter and peptostreptococcus -WBC 12.1> 6.5, tmax= 101.1 -SCr ~ 0.5   Plan: -Cefepime 2gm IV q8h -Will follow renal function, cultures and clinical progress    Height: 6' (182.9 cm) Weight: 74.4 kg (164 lb 0.4 oz) IBW/kg (Calculated) : 77.6  Temp (24hrs), Avg:99.1 F (37.3 C), Min:97.7 F (36.5 C), Max:101.1 F (38.4 C)  Recent Labs  Lab 02/17/23 0848 02/18/23 0401 02/18/23 1425 02/19/23 0440 02/20/23 0320 02/21/23 0440  WBC  --  9.6 11.8* 12.8* 12.1* 6.5  CREATININE 0.68 0.46*  --  0.59* 0.73 0.50*    Estimated Creatinine Clearance: 100.8 mL/min (A) (by C-G formula based on SCr of 0.5 mg/dL (L)).    No Known Allergies  Antimicrobials this admission: Vanc 1/20>1/27 Cefepime 1/20>1/27 Flagyl 1/20>1/30; 2/2>> Azith 1/27> Ceftriaxone 1/27 > 1/30 Unasyn 1/30 >2/2 Cefepime 2/2>>  Dose adjustments this admission:   Microbiology results: 1/21 BCx: NGF 1/21 MRSA PCR: positive 1/21 pleural fluid: abundant GNR, GPC pairs/chains >> on culture mixed anaerobes >> campylobacter, peptostreptococcus, + others unspeciated, mixed anaerobes 1/21 MRSA: positive 1/23 abscess: streptococcus constellatus - PCN sens 1/30 Pleural: neg  Thank you for allowing pharmacy to be a part of this patient's care.  Harland German, PharmD Clinical Pharmacist **Pharmacist phone directory can now be found on [email or web address] ([email or web address]).  Listed under [facility].

## 2023-02-22 DIAGNOSIS — A045 Campylobacter enteritis: Secondary | ICD-10-CM

## 2023-02-22 DIAGNOSIS — J9601 Acute respiratory failure with hypoxia: Secondary | ICD-10-CM | POA: Diagnosis not present

## 2023-02-22 LAB — BASIC METABOLIC PANEL
Anion gap: 10 (ref 5–15)
BUN: 9 mg/dL (ref 8–23)
CO2: 27 mmol/L (ref 22–32)
Calcium: 8.6 mg/dL — ABNORMAL LOW (ref 8.9–10.3)
Chloride: 96 mmol/L — ABNORMAL LOW (ref 98–111)
Creatinine, Ser: 0.55 mg/dL — ABNORMAL LOW (ref 0.61–1.24)
GFR, Estimated: >60 mL/min (ref >60)
Glucose, Bld: 97 mg/dL (ref 70–99)
Potassium: 4.2 mmol/L (ref 3.5–5.1)
Sodium: 133 mmol/L — ABNORMAL LOW (ref 135–145)

## 2023-02-22 LAB — CBC
HCT: 29.7 % — ABNORMAL LOW (ref 39.0–52.0)
Hemoglobin: 9.8 g/dL — ABNORMAL LOW (ref 13.0–17.0)
MCH: 30 pg (ref 26.0–34.0)
MCHC: 33 g/dL (ref 30.0–36.0)
MCV: 90.8 fL (ref 80.0–100.0)
Platelets: 325 10*3/uL (ref 150–400)
RBC: 3.27 MIL/uL — ABNORMAL LOW (ref 4.22–5.81)
RDW: 16.9 % — ABNORMAL HIGH (ref 11.5–15.5)
WBC: 7.1 10*3/uL (ref 4.0–10.5)
nRBC: 0 % (ref 0.0–0.2)

## 2023-02-22 MED ORDER — DEXAMETHASONE SODIUM PHOSPHATE 10 MG/ML IJ SOLN
INTRAMUSCULAR | Status: AC
  Filled 2023-02-22: qty 1

## 2023-02-22 MED ORDER — 0.9 % SODIUM CHLORIDE (POUR BTL) OPTIME
TOPICAL | Status: DC | PRN
  Administered 2023-02-22 – 2023-02-23 (×2): 2000 mL

## 2023-02-22 MED ORDER — PROPOFOL 10 MG/ML IV BOLUS
INTRAVENOUS | Status: AC
  Filled 2023-02-22: qty 20

## 2023-02-22 MED ORDER — SODIUM CHLORIDE (PF) 0.9 % IJ SOLN
INTRAMUSCULAR | Status: AC
Start: 2023-02-22 — End: ?
  Filled 2023-02-22: qty 10

## 2023-02-22 MED ORDER — MIDAZOLAM HCL (PF) 10 MG/2ML IJ SOLN
INTRAMUSCULAR | Status: AC
  Filled 2023-02-22: qty 2

## 2023-02-22 MED ORDER — AMIODARONE HCL 200 MG PO TABS
200.0000 mg | ORAL_TABLET | Freq: Every day | ORAL | Status: DC
  Administered 2023-03-01 – 2023-03-07 (×7): 200 mg via ORAL
  Filled 2023-02-22 (×7): qty 1

## 2023-02-22 MED ORDER — ROCURONIUM BROMIDE 10 MG/ML (PF) SYRINGE
PREFILLED_SYRINGE | INTRAVENOUS | Status: AC
Start: 2023-02-22 — End: ?
  Filled 2023-02-22: qty 10

## 2023-02-22 MED ORDER — FENTANYL CITRATE (PF) 250 MCG/5ML IJ SOLN
INTRAMUSCULAR | Status: AC
  Filled 2023-02-22: qty 5

## 2023-02-22 MED ORDER — ALTEPLASE 2 MG IJ SOLR
2.0000 mg | Freq: Once | INTRAMUSCULAR | Status: AC
  Administered 2023-02-22: 2 mg
  Filled 2023-02-22: qty 2

## 2023-02-22 MED ORDER — AMIODARONE HCL 200 MG PO TABS
400.0000 mg | ORAL_TABLET | Freq: Two times a day (BID) | ORAL | Status: AC
  Administered 2023-02-22 – 2023-02-28 (×14): 400 mg via ORAL
  Filled 2023-02-22 (×14): qty 2

## 2023-02-22 MED ORDER — LIDOCAINE 2% (20 MG/ML) 5 ML SYRINGE
INTRAMUSCULAR | Status: AC
  Filled 2023-02-22: qty 5

## 2023-02-22 MED ORDER — ONDANSETRON HCL 4 MG/2ML IJ SOLN
INTRAMUSCULAR | Status: AC
  Filled 2023-02-22: qty 2

## 2023-02-22 NOTE — Progress Notes (Signed)
Patient has declined multiple attempts at mobility due to pain and wanting to wait until after surgery.  Procedure re-scheduled for tomorrow.  Patient notified.  Patient still wants to wait for procedure before mobility.

## 2023-02-22 NOTE — Progress Notes (Signed)
 PT Cancellation Note  Patient Details Name: Mathew Wyatt MRN: [id number] DOB: Aug 05, 1960   Cancelled Treatment:    Reason Eval/Treat Not Completed: Patient declined, no reason specified (pt reports he's tired after xanax , awaiting VATS and declined mobility prior to procedure. Pt stating he will willingly work next date to prepare for home)   Mathew Wyatt 02/22/2023, 11:37 AM Mathew Wyatt, PT Acute Rehabilitation Services Office: [phone]

## 2023-02-22 NOTE — Anesthesia Preprocedure Evaluation (Addendum)
 Anesthesia Evaluation    Airway Mallampati: II  TM Distance: >3 FB Neck ROM: Full    Dental no notable dental hx.    Pulmonary Current Smoker LLL pneumonia: empyema  02/21/2023 CXR:  1. No substantial change. 2. Persistent loculated left-sided hydropneumothorax with left pleural drain in place. 3. Persistent consolidative airspace disease in the left mid lung. 4. Persistent diffuse interstitial and patchy airspace disease in the right upper lung.    Pulmonary exam normal breath sounds clear to auscultation       Cardiovascular Normal cardiovascular exam+ dysrhythmias Atrial Fibrillation  Rhythm:Regular Rate:Normal  02/08/2023 ECHO: EF45 to 50%.  1. The LV has mildly decreased function, global hypokinesis. Left ventricular diastolic parameters were normal. The average left ventricular  global longitudinal strain is -12.9 %. The global longitudinal strain is  abnormal.   2. RVF is normal. The right ventricular size is mildly enlarged. Tricuspid regurgitation signal is inadequate for assessing PA pressure.   3. There is a small pericardial effusion, mostly around the LV apex and lateral wall, in the immediate vicinity of an area of complex loculated small left pleural effusion. There appears to be exaggerated respiratory displacement of the ventricular  septum, suggesting ventricular interdependence. Consider a component of  constrictive physiology. a small pericardial effusion is present. There is no evidence of cardiac tamponade.   4. The mitral valve is normal in structure. Trivial mitral valve regurgitation. No evidence of mitral stenosis.   5. The aortic valve is tricuspid. Aortic valve regurgitation is not visualized. No aortic stenosis is present.     Neuro/Psych  Headaches    GI/Hepatic   Endo/Other    Renal/GU      Musculoskeletal   Abdominal   Peds  Hematology  (+) Blood dyscrasia (Hb 9.8, plt 325k), anemia    Anesthesia Other Findings   Reproductive/Obstetrics                             Anesthesia Physical Anesthesia Plan  ASA: 4  Anesthesia Plan: General   Post-op Pain Management: Toradol  IV (intra-op)*   Induction: Intravenous  PONV Risk Score and Plan: 1 and Ondansetron  and Dexamethasone   Airway Management Planned: Oral ETT and Double Lumen EBT  Additional Equipment: Arterial line  Intra-op Plan:   Post-operative Plan: Extubation in OR  Informed Consent:   Plan Discussed with:   Anesthesia Plan Comments: (63 year old male with past medical history of tobacco use, COPD who presented to [facility] on 1/20 with cough and shortness of breath. Reportedly having worsening symptoms over the preceding 2 weeks. He denied having fever, chills, chest pain, n/v/d. He was found to be tachypneic with significant leukocytosis to 35.7, lactifc 4.4. he was started on IV antibiotics. He had CT chest which demonstrated a large left-sided loculated hydropneumothorax concerning for empyema or abscess. He was transferred to [facility] on 1/21 initially to hospitalist for CT surgery consult and possible VATS. On 1/21 he developed atrial fibrillation with RVR and hypotension and was transferred to [facility] and admitted to ICU. )       Anesthesia Quick Evaluation

## 2023-02-22 NOTE — Progress Notes (Signed)
 PROGRESS NOTE  Naphtali Zywicki FMW:[id number] DOB: 07/03/1960 DOA: 02/08/2023 PCP: Patient, No Pcp Per   LOS: 14 days   Brief narrative:  Ephrem Carrick is a 63 y.o. male with past medical history of COPD and a smoker who had initially presented to [facility] hospital on 02/07/2023 with shortness of breath dyspnea on exertion with productive cough.  He was noted to be tachypneic with significant leukocytosis and elevated lactate.  Patient was started on IV antibiotic.  CT scan of the chest however was concerning for empyema versus abscess so he was transferred to [facility] on 02/08/2023 for further evaluation.  Patient however went into A-fib with RVR and hypotension and was admitted to ICU service.   1/21: TCTS defer VATS. ICU for afib rvr hypotension, started on amiodarone . CT placed with immediate 2L purulent output. Initial tube lytics with additional 300cc output. ECHO neg for vegetation, EF 45-50% 1/22: repeat CT chest 1/23 new chest tube placed by IR, cultures growing Campylobacter and Peptostreptococcus 1/26 dec'd pleural output but has persistent loculated fluid collection  1/27 AF w/ RVR. Treated w/ BB. Seen by ID; vanc stopped, switched to ceftriaxone , flagyl  and azith for campylobacter. CT chest obtained. The anterior fluid collection has improved. The lateral effusion vol is less but loculated air maintains. Now has Right layering effusion, and new patchy areas of GG changes. Some mild dec in left GG changes. Repeated round 4 pleural lytics (administered in right lateral tube)  1/28: lateral CT 120 net neg  1/30 no significant change after 7 doses of pleural lytic therapy.  -1/30, seen by Dr. Shyrl, patient refused VATS -1/31: Changed his mind, now agrees to surgery, TCTS notified -2/1: PCCM restarted Heparin  gtt -2/3, hemoglobin down to 6.0, heparin  held, transfusing, T CTS eval pending  Subjective: -Cough, shortness of breath, some pain  Assessment/Plan:  Sepsis secondary to  left empyema, pneumonia Acute hypoxic respiratory failure -Status post chest tube placement x2.  Second chest tube placement by IR 02/10/2023.  -Was on ceftriaxone , Flagyl  and azithromycin , followed by Unasyn  per ID then changed to cefepime /metronidazole  per PCCM 2/1 -Orthopantogram with dental caries, unfortunately no inpatient dental coverage available now, would need outpatient follow-up -Pleural fluid cultures initially grew Campylobacter and Peptostreptococcus  -Repeat cultures with Streptococcus constellatus, pansensitive -CT surgery, pulmonary and ID following.    -Has completed 7 doses of fibrinolytic therapy without considerable improvement, T CTS consulted, seen by Dr. Shyrl 1/30 patient refused VATS/decortication,  -1/31 changed his mind, willing to have surgery, T CTS notified 1/31 -Plan for VATS/decortication today -Pulmonary following, was restarted on IV heparin  2/1, discontinued again 2/3  New onset atrial fibrillation with RVR.   -Was on amiodarone  drip, then changed to p.o., back on amiodarone  gtt. again, continue metoprolol , RVR likely driven by empyema, has converted to sinus rhythm will change to oral amiodarone  -we were holding heparin  with empyema and ongoing fibrinolytic therapy -2/1 back on heparin , now held again  Acute on chronic anemia -Hemoglobin trending down, repeat chest x-ray, possibly has hemopneumothorax -Transfusing PRBC, hold heparin , defer anticoagulation in the setting of empyema, chest tube -Monitor hemoglobin  Acute systolic congestive heart failure with moderately reduced ejection fraction.  2D echocardiogram 45 to 50% with global hypokinesis. 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  Continue nebulizers.   insomnia anxiety.  On muscle relaxant Atarax  Ambien  and pain medication -Add Xanax  as needed  DVT  prophylaxis: SCDs  disposition: Home pending resolution of empyema    Code Status:    Code Status: Full Code  Family Communication: None at bedside  Consultants: CT surgery PCCM Interventional radiology Cardiology  Procedures: Left-sided chest tube placement x 2. Fibrinolytic treatment x 3  Anti-infectives:  Rocephin  Flagyl  and Zithromax .  Anti-infectives (From admission, onward)    Start     Dose/Rate Route Frequency Ordered Stop   02/20/23 1615  metroNIDAZOLE  (FLAGYL ) tablet 500 mg        500 mg Oral Every 8 hours 02/20/23 1516     02/20/23 1615  ceFEPIme  (MAXIPIME ) 2 g in sodium chloride  0.9 % 100 mL IVPB        2 g 200 mL/hr over 30 Minutes Intravenous Every 8 hours 02/20/23 1519     02/17/23 2000  Ampicillin -Sulbactam (UNASYN ) 3 g in sodium chloride  0.9 % 100 mL IVPB  Status:  Discontinued        3 g 200 mL/hr over 30 Minutes Intravenous Every 6 hours 02/17/23 0917 02/20/23 1516   02/14/23 2200  metroNIDAZOLE  (FLAGYL ) tablet 500 mg  Status:  Discontinued        500 mg Oral Every 12 hours 02/14/23 1412 02/17/23 0917   02/14/23 2000  cefTRIAXone  (ROCEPHIN ) 2 g in sodium chloride  0.9 % 100 mL IVPB  Status:  Discontinued        2 g 200 mL/hr over 30 Minutes Intravenous Every 24 hours 02/14/23 1410 02/17/23 0917   02/14/23 1500  azithromycin  (ZITHROMAX ) tablet 500 mg        500 mg Oral Daily 02/14/23 1410     02/12/23 0200  vancomycin  (VANCOREADY) IVPB 1500 mg/300 mL  Status:  Discontinued        1,500 mg 150 mL/hr over 120 Minutes Intravenous Every 12 hours 02/11/23 1301 02/14/23 1410   02/11/23 1430  vancomycin  (VANCOCIN ) 750 mg in sodium chloride  0.9 % 250 mL IVPB  Status:  Discontinued        750 mg 265 mL/hr over 60 Minutes Intravenous  Once 02/11/23 1309 02/11/23 1354   02/11/23 1430  vancomycin  (VANCOCIN ) 750 mg in sodium chloride  0.9 % 250 mL IVPB        750 mg 250 mL/hr over 60 Minutes Intravenous  Once 02/11/23 1354 02/11/23 1543   02/11/23 1400  vancomycin   (VANCOREADY) IVPB 750 mg/150 mL  Status:  Discontinued        750 mg 150 mL/hr over 60 Minutes Intravenous  Once 02/11/23 1301 02/11/23 1309   02/10/23 2130  metroNIDAZOLE  (FLAGYL ) IVPB 500 mg  Status:  Discontinued        500 mg 100 mL/hr over 60 Minutes Intravenous Every 12 hours 02/10/23 1434 02/14/23 1410   02/08/23 1730  metroNIDAZOLE  (FLAGYL ) IVPB 500 mg  Status:  Discontinued        500 mg 100 mL/hr over 60 Minutes Intravenous Every 8 hours 02/08/23 1637 02/10/23 1434   02/08/23 0400  vancomycin  (VANCOREADY) IVPB 750 mg/150 mL  Status:  Discontinued        750 mg 150 mL/hr over 60 Minutes Intravenous Every 12 hours 02/08/23 0324 02/08/23 0328   02/08/23 0400  ceFEPIme  (MAXIPIME ) 2 g in sodium chloride  0.9 % 100 mL IVPB  Status:  Discontinued        2 g 200 mL/hr over 30 Minutes Intravenous Every 8 hours 02/08/23 0324 02/14/23 1410   02/08/23 0400  vancomycin  (VANCOCIN ) 750 mg in sodium chloride   0.9 % 250 mL IVPB  Status:  Discontinued        750 mg 250 mL/hr over 60 Minutes Intravenous Every 12 hours 02/08/23 0328 02/11/23 1258       Objective: Vitals:   02/22/23 0700 02/22/23 0800  BP: (!) 120/56 108/62  Pulse: 91 89  Resp: 19 17  Temp: 98.6 F (37 C)   SpO2: 94% 92%    Intake/Output Summary (Last 24 hours) at 02/22/2023 0935 Last data filed at 02/22/2023 0905 Gross per 24 hour  Intake 2399.92 ml  Output 560 ml  Net 1839.92 ml   Filed Weights   02/20/23 0530 02/21/23 0400 02/22/23 0413  Weight: 73.6 kg 74.4 kg 75.6 kg   Body mass index is 22.6 kg/m.   Physical Exam:  Chronically ill male sitting up in bed, AAOx3 HEENT: No JVD CVS: S1-S2, irregular rhythm Lungs: Rhonchi on the left, chest tube Abdomen: Soft, nontender, bowel sounds present Extremities: No edema  Data Review: I have personally reviewed the following laboratory data and studies,  CBC: Recent Labs  Lab 02/19/23 0440 02/20/23 0320 02/21/23 0440 02/21/23 1535 02/22/23 0711  WBC 12.8*  12.1* 6.5 7.6 7.1  HGB 8.6* 8.5* 6.0* 9.2* 9.8*  HCT 26.9* 26.4* 19.4* 28.4* 29.7*  MCV 94.1 93.6 96.0 91.3 90.8  PLT 338 345 241 303 325   Basic Metabolic Panel: Recent Labs  Lab 02/16/23 0225 02/17/23 0848 02/18/23 0401 02/19/23 0440 02/20/23 0320 02/21/23 0440 02/22/23 0711  NA 130* 134* 134* 134* 132* 136 133*  K 4.0 3.8 3.0* 4.0 4.3 3.1* 4.2  CL 99 104 110 102 100 104 96*  CO2 21* 23 21* 24 26 22 27   GLUCOSE 131* 105* 120* 194* 187* 112* 97  BUN 11 11 8  7* 11 11 9   CREATININE 0.67 0.68 0.46* 0.59* 0.73 0.50* 0.55*  CALCIUM 7.9* 7.8* 6.6* 8.1* 7.8* 6.7* 8.6*  MG 2.0 1.7  --  1.7  --   --   --   PHOS 2.9  --   --   --   --   --   --    Liver Function Tests: No results for input(s): AST, ALT, ALKPHOS, BILITOT, PROT, ALBUMIN  in the last 168 hours.  No results for input(s): LIPASE, AMYLASE in the last 168 hours.  No results for input(s): AMMONIA in the last 168 hours. Cardiac Enzymes: No results for input(s): CKTOTAL, CKMB, CKMBINDEX, TROPONINI in the last 168 hours. BNP (last 3 results) No results for input(s): BNP in the last 8760 hours.  ProBNP (last 3 results) No results for input(s): PROBNP in the last 8760 hours.  CBG: Recent Labs  Lab 02/20/23 2352  GLUCAP 132*   Recent Results (from the past 240 hours)  Body fluid culture w Gram Stain     Status: None   Collection Time: 02/17/23  9:04 AM   Specimen: Pleura; Body Fluid  Result Value Ref Range Status   Specimen Description PLEURAL  Final   Special Requests body fluid  Final   Gram Stain   Final    FEW WBC PRESENT, PREDOMINANTLY PMN NO ORGANISMS SEEN    Culture   Final    NO GROWTH 3 DAYS Performed at [facility], [address] [place], [place] [address]    Report Status 02/20/2023 FINAL  Final  Respiratory (~20 pathogens) panel by PCR     Status: None   Collection Time: 02/20/23  4:42 PM   Specimen: Nasopharyngeal Swab; Respiratory  Result Value Ref Range  Status   Adenovirus NOT DETECTED NOT DETECTED Final   Coronavirus 229E NOT DETECTED NOT DETECTED Final    Comment: (NOTE) The Coronavirus on the Respiratory Panel, DOES NOT test for the novel  Coronavirus (2019 nCoV)    Coronavirus HKU1 NOT DETECTED NOT DETECTED Final   Coronavirus NL63 NOT DETECTED NOT DETECTED Final   Coronavirus OC43 NOT DETECTED NOT DETECTED Final   Metapneumovirus NOT DETECTED NOT DETECTED Final   Rhinovirus / Enterovirus NOT DETECTED NOT DETECTED Final   Influenza A NOT DETECTED NOT DETECTED Final   Influenza B NOT DETECTED NOT DETECTED Final   Parainfluenza Virus 1 NOT DETECTED NOT DETECTED Final   Parainfluenza Virus 2 NOT DETECTED NOT DETECTED Final   Parainfluenza Virus 3 NOT DETECTED NOT DETECTED Final   Parainfluenza Virus 4 NOT DETECTED NOT DETECTED Final   Respiratory Syncytial Virus NOT DETECTED NOT DETECTED Final   Bordetella pertussis NOT DETECTED NOT DETECTED Final   Bordetella Parapertussis NOT DETECTED NOT DETECTED Final   Chlamydophila pneumoniae NOT DETECTED NOT DETECTED Final   Mycoplasma pneumoniae NOT DETECTED NOT DETECTED Final    Comment: Performed at [facility], [address] [place], [place] [address]  Culture, blood (Routine X 2) w Reflex to ID Panel     Status: None (Preliminary result)   Collection Time: 02/21/23  8:08 AM   Specimen: BLOOD LEFT HAND  Result Value Ref Range Status   Specimen Description BLOOD LEFT HAND  Final   Special Requests   Final    BOTTLES DRAWN AEROBIC AND ANAEROBIC Blood Culture results may not be optimal due to an inadequate volume of blood received in culture bottles   Culture   Final    NO GROWTH < 24 HOURS Performed at [facility], [address], [place], [place] [address]    Report Status PENDING  Incomplete  Culture, blood (Routine X 2) w Reflex to ID Panel     Status: None (Preliminary result)   Collection Time: 02/21/23  8:27 AM   Specimen: BLOOD RIGHT HAND  Result Value Ref  Range Status   Specimen Description BLOOD RIGHT HAND  Final   Special Requests   Final    BOTTLES DRAWN AEROBIC AND ANAEROBIC Blood Culture results may not be optimal due to an inadequate volume of blood received in culture bottles   Culture   Final    NO GROWTH < 24 HOURS Performed at [facility], [address], [place], [place] [address]    Report Status PENDING  Incomplete     Studies: DG CHEST PORT 1 VIEW Result Date: 02/21/2023 CLINICAL DATA:  Left-sided chest tube EXAM: PORTABLE CHEST 1 VIEW COMPARISON:  02/20/2023 FINDINGS: Left pleural drain again noted with persistent loculated left-sided hydropneumothorax. Consolidative airspace disease in the left mid lung is similar to prior. There is persistent diffuse interstitial and patchy airspace disease in the right upper lung. The cardio pericardial silhouette is enlarged. Left PICC line tip overlies the mid SVC level. Telemetry leads overlie the chest. IMPRESSION: 1. No substantial change. 2. Persistent loculated left-sided hydropneumothorax with left pleural drain in place. 3. Persistent consolidative airspace disease in the left mid lung. 4. Persistent diffuse interstitial and patchy airspace disease in the right upper lung. Electronically Signed   By: Camellia Candle M.D.   On: 02/21/2023 10:11      Sigurd Pac, MD  [facility] 02/22/2023  If 7PM-7AM, please contact night-coverage

## 2023-02-22 NOTE — Plan of Care (Signed)
  Problem: Fluid Volume: Goal: Hemodynamic stability will improve Outcome: Progressing   Problem: Clinical Measurements: Goal: Diagnostic test results will improve Outcome: Progressing Goal: Signs and symptoms of infection will decrease Outcome: Progressing   Problem: Respiratory: Goal: Ability to maintain adequate ventilation will improve Outcome: Progressing   Problem: Education: Goal: Knowledge of General Education information will improve Description: Including pain rating scale, medication(s)/side effects and non-pharmacologic comfort measures Outcome: Progressing   Problem: Health Behavior/Discharge Planning: Goal: Ability to manage health-related needs will improve Outcome: Progressing   Problem: Clinical Measurements: Goal: Ability to maintain clinical measurements within normal limits will improve Outcome: Progressing Goal: Will remain free from infection Outcome: Progressing Goal: Diagnostic test results will improve Outcome: Progressing Goal: Respiratory complications will improve Outcome: Progressing Goal: Cardiovascular complication will be avoided Outcome: Progressing   Problem: Elimination: Goal: Will not experience complications related to bowel motility Outcome: Progressing Goal: Will not experience complications related to urinary retention Outcome: Progressing   Problem: Skin Integrity: Goal: Risk for impaired skin integrity will decrease Outcome: Progressing   Problem: Education: Goal: Knowledge of disease or condition will improve Outcome: Progressing Goal: Knowledge of the prescribed therapeutic regimen will improve Outcome: Progressing   Problem: Activity: Goal: Ability to tolerate increased activity will improve Outcome: Progressing Goal: Will verbalize the importance of balancing activity with adequate rest periods Outcome: Progressing   Problem: Respiratory: Goal: Ability to maintain a clear airway will improve Outcome:  Progressing Goal: Levels of oxygenation will improve Outcome: Progressing Goal: Ability to maintain adequate ventilation will improve Outcome: Progressing

## 2023-02-22 NOTE — Progress Notes (Signed)
Picc has no blood return on all 3 lumens but able to flush. IVTeam was consulted. TPA was placed in 2 lumens.  IV Team will come back after two hrs. -phlebotomist was informed to Draw labs.

## 2023-02-22 NOTE — Progress Notes (Signed)
 NAME:  Mathew Wyatt, MRN:  [id number], DOB:  1960/09/18, LOS: 14 ADMISSION DATE:  02/08/2023, CONSULTATION DATE: 02/08/2023  REFERRING MD: Add, CHIEF COMPLAINT: Hypotension, atrial fibrillation, large empyema on left with hydropneumothorax  History of Present Illness:  63 year old male with past medical history of tobacco use, COPD who presented to [facility] on 1/20 with cough and shortness of breath. Reportedly having worsening symptoms over the preceding 2 weeks. He denied having fever, chills, chest pain, n/v/d. He was found to be tachypneic with significant leukocytosis to 35.7, lactifc 4.4. he was started on IV antibiotics. He had CT chest which demonstrated a large left-sided loculated hydropneumothorax concerning for empyema or abscess. He was transferred to [facility] on 1/21 initially to hospitalist for CT surgery consult and possible VATS. On 1/21 he developed atrial fibrillation with RVR and hypotension and was transferred to [facility] and admitted to ICU.   Pertinent  Medical History  History reviewed. No pertinent past medical history.   Significant Hospital Events: Including procedures, antibiotic start and stop dates in addition to other pertinent events   1/20: [facility] ED with SOB. CT with large left hydropneumo c/w empyema vs abscess.  1/21: TCTS defer VATS. ICU for afib rvr hypotension, started on amiodarone . CT placed with immediate 2L purulent output. Initial tube lytics with additional 300cc output. ECHO neg for vegetation, EF 45-50% 1/22: repeat CT chest today to eval for need of additional CT vs more lytics vs observation.  1/23 new chest tube placed by IR, cultures growing Campylobacter and Peptostreptococcus 1/26 dec'd pleural output but has persistent loculated fluid collection  1/27 AF w/ RVR. Treated w/ BB. Seen by ID; vanc stopped, switched to ceftriaxone , flagyl  and azith for campylobacter. CT chest obtained. The anterior fluid collection has improved. The lateral effusion vol is  less but loculated air maintains. Now has Right layering effusion, and new patchy areas of GG changes. Some mild dec in left GG changes. Repeated round 4 pleural lytics (administered in right lateral tube)  1/28: lateral CT 120 net neg after accounting for TPA. AM cxr possible marginal Improvement in loculated ptx. 1/29 no significant change after 5 doses of pleural lytic therapy.  Discussed case again with thoracic surgery who will reevaluate for possible VATS.  Lateral chest tube became dislodged, removed. 1/30 s/p 7 doses of pleural lytics. ~150 mL output from chest tube overnight. 1/31 Essentially unchanged may be small improvement in rind and small improvement in pneumothorax  2/1 no issues overnight, 200 out of chest tube in last 24 hours 2/2 Output in last 24 hours 80 cc  Interim History / Subjective:  On 7L [place] Continues to have minimal chest tube output NPO for surgery today Objective   Blood pressure 110/61, pulse 89, temperature 98.2 F (36.8 C), temperature source Oral, resp. rate 20, height 6' (1.829 m), weight 75.6 kg, SpO2 92%.        Intake/Output Summary (Last 24 hours) at 02/22/2023 1438 Last data filed at 02/22/2023 1118 Gross per 24 hour  Intake 560 ml  Output 1160 ml  Net -600 ml   Filed Weights   02/20/23 0530 02/21/23 0400 02/22/23 0413  Weight: 73.6 kg 74.4 kg 75.6 kg   Physical Exam: General: Well-appearing, no acute distress HENT: [place], AT Eyes: EOMI, no scleral icterus Respiratory: Diminished L>R to auscultation bilaterally.  No crackles, wheezing or rales. Left chest tube in place Cardiovascular: RRR, -M/R/G, no JVD Extremities:-Edema,-tenderness Neuro: AAO x4, CNII-XII grossly intact Psych: Normal mood, normal affect  Hg 9.8  Resolved Problems   Sepsis  Af w/ RVR  Assessment & Plan:   Acute hypoxemic respiratory failure - worsening Sepsis secondary to Campylobacter/Peptostreptococcus/Streptococcus constellatus empyema Pneumothorax due to lung  entrapment, lack of lung expansion S/p chest tube x 2, currently only one in place S/p intrapleural lytics x 7 doses. Last given 1/29 P: --Routine chest tube care --Continue chest tube -40 cm H20 suction --CXR PRN --Continue antibiotics. Cefepime  and Flagyl . ID following --TCT following. NPO for left VATS/decortication  Acute blood loss anemia Hold on systemic anticoagulation in setting of recent intrapleural lytic use and recent hemoglobin drop  Atrial fibrillation As above  Signature   Care Time: 25 min  Slater Staff, M.D. [facility] Pulmonary/Critical Care Medicine 02/22/2023 2:38 PM   See Amion for personal pager For hours between 7 PM to 7 AM, please call Elink for urgent questions

## 2023-02-22 NOTE — Progress Notes (Signed)
 [facility]  Date of Admission:  02/08/2023     Total days of antibiotics 15         ASSESSMENT:  Mathew Wyatt is scheduled for VATS and decortication this afternoon in the setting of empyema s/p pigtail insertion and continued fever. Reviewed plan of care to continue with current dose of Cefepime  and Metronidazole  with surgery helping to provide source control of infection. Anxious about post-operative pain and deconditioning with re-assurance provided. Remaining medical and supportive care per Internal Medicine.   PLAN:  Continue current dose of Cefepime  and Metronidazole . Surgical intervention planned for today with CVTS. Remaining medical and supportive care per Internal Medicine.   Principal Problem:   Sepsis due to pneumonia [facility]) Active Problems:   Empyema (HCC)   Sepsis associated hypotension (HCC)   Need for management of chest tube   Hydropneumothorax   Pneumonia of left lower lobe due to infectious organism   Pressure injury of skin   Atrial fibrillation with rapid ventricular response (HCC)   Acute systolic heart failure (HCC)    sodium chloride    Intravenous Once   [START ON 03/01/2023] amiodarone   200 mg Oral Daily   amiodarone   400 mg Oral BID   azithromycin   500 mg Oral Daily   Chlorhexidine  Gluconate Cloth  6 each Topical Q0600   feeding supplement  237 mL Oral BID BM   Gerhardt's butt cream   Topical TID   lidocaine   5 mL Intradermal Once   melatonin  5 mg Oral QHS   methocarbamol  (ROBAXIN ) injection  500 mg Intravenous Q8H   Or   methocarbamol   500 mg Oral Q8H   metoprolol  tartrate  12.5 mg Oral BID   metroNIDAZOLE   500 mg Oral Q8H   mupirocin  ointment   Nasal BID   nicotine   21 mg Transdermal Daily   oxyCODONE   10 mg Oral Q6H   pantoprazole   40 mg Oral Daily   polyethylene glycol  17 g Oral Daily   sodium chloride  flush  10 mL Intrapleural Q8H   sodium chloride  flush  10-40 mL Intracatheter Q12H   spironolactone   12.5  mg Oral Daily    SUBJECTIVE:  Febrile with temperature of 100.8 F over the last 24 hours. CVTS plan for surgery today. Has concern about post-operative pain and also deconditioning/rehabilitation.   No Known Allergies   Review of Systems: Review of Systems  Constitutional:  Negative for chills, fever and weight loss.  Respiratory:  Positive for cough. Negative for shortness of breath and wheezing.   Cardiovascular:  Positive for chest pain. Negative for leg swelling.  Gastrointestinal:  Negative for abdominal pain, constipation, diarrhea, nausea and vomiting.  Skin:  Negative for rash.      OBJECTIVE: Vitals:   02/22/23 0800 02/22/23 0900 02/22/23 1100 02/22/23 1223  BP: 108/62 102/67 102/66   Pulse: 89 90 87   Resp: 17 17 18    Temp:   98.2 F (36.8 C)   TempSrc:   Oral   SpO2: 92% 95% 95% 92%  Weight:      Height:       Body mass index is 22.6 kg/m.  Physical Exam Constitutional:      General: He is not in acute distress.    Appearance: He is well-developed.  Cardiovascular:     Rate and Rhythm: Normal rate and regular rhythm.     Heart sounds: Normal heart sounds.  Pulmonary:     Effort: Pulmonary effort  is normal.     Breath sounds: Normal breath sounds.  Skin:    General: Skin is warm and dry.  Neurological:     Mental Status: He is alert.  Psychiatric:        Mood and Affect: Mood is anxious.     Lab Results Lab Results  Component Value Date   WBC 7.1 02/22/2023   HGB 9.8 (L) 02/22/2023   HCT 29.7 (L) 02/22/2023   MCV 90.8 02/22/2023   PLT 325 02/22/2023    Lab Results  Component Value Date   CREATININE 0.55 (L) 02/22/2023   BUN 9 02/22/2023   NA 133 (L) 02/22/2023   K 4.2 02/22/2023   CL 96 (L) 02/22/2023   CO2 27 02/22/2023    Lab Results  Component Value Date   ALT 26 02/09/2023   AST 44 (H) 02/09/2023   ALKPHOS 117 02/09/2023   BILITOT 1.2 02/09/2023     Microbiology: Recent Results (from the past 240 hours)  Body fluid  culture w Gram Stain     Status: None   Collection Time: 02/17/23  9:04 AM   Specimen: Pleura; Body Fluid  Result Value Ref Range Status   Specimen Description PLEURAL  Final   Special Requests body fluid  Final   Gram Stain   Final    FEW WBC PRESENT, PREDOMINANTLY PMN NO ORGANISMS SEEN    Culture   Final    NO GROWTH 3 DAYS Performed at [facility], [address], [place], [place] [address]    Report Status 02/20/2023 FINAL  Final  Respiratory (~20 pathogens) panel by PCR     Status: None   Collection Time: 02/20/23  4:42 PM   Specimen: Nasopharyngeal Swab; Respiratory  Result Value Ref Range Status   Adenovirus NOT DETECTED NOT DETECTED Final   Coronavirus 229E NOT DETECTED NOT DETECTED Final    Comment: (NOTE) The Coronavirus on the Respiratory Panel, DOES NOT test for the novel  Coronavirus (2019 nCoV)    Coronavirus HKU1 NOT DETECTED NOT DETECTED Final   Coronavirus NL63 NOT DETECTED NOT DETECTED Final   Coronavirus OC43 NOT DETECTED NOT DETECTED Final   Metapneumovirus NOT DETECTED NOT DETECTED Final   Rhinovirus / Enterovirus NOT DETECTED NOT DETECTED Final   Influenza A NOT DETECTED NOT DETECTED Final   Influenza B NOT DETECTED NOT DETECTED Final   Parainfluenza Virus 1 NOT DETECTED NOT DETECTED Final   Parainfluenza Virus 2 NOT DETECTED NOT DETECTED Final   Parainfluenza Virus 3 NOT DETECTED NOT DETECTED Final   Parainfluenza Virus 4 NOT DETECTED NOT DETECTED Final   Respiratory Syncytial Virus NOT DETECTED NOT DETECTED Final   Bordetella pertussis NOT DETECTED NOT DETECTED Final   Bordetella Parapertussis NOT DETECTED NOT DETECTED Final   Chlamydophila pneumoniae NOT DETECTED NOT DETECTED Final   Mycoplasma pneumoniae NOT DETECTED NOT DETECTED Final    Comment: Performed at [facility], [address], [place], [place] [address]  Culture, blood (Routine X 2) w Reflex to ID Panel     Status: None (Preliminary result)   Collection Time: 02/21/23   8:08 AM   Specimen: BLOOD LEFT HAND  Result Value Ref Range Status   Specimen Description BLOOD LEFT HAND  Final   Special Requests   Final    BOTTLES DRAWN AEROBIC AND ANAEROBIC Blood Culture results may not be optimal due to an inadequate volume of blood received in culture bottles   Culture   Final  NO GROWTH < 24 HOURS Performed at [facility], [address], [place], [place] [address]    Report Status PENDING  Incomplete  Culture, blood (Routine X 2) w Reflex to ID Panel     Status: None (Preliminary result)   Collection Time: 02/21/23  8:27 AM   Specimen: BLOOD RIGHT HAND  Result Value Ref Range Status   Specimen Description BLOOD RIGHT HAND  Final   Special Requests   Final    BOTTLES DRAWN AEROBIC AND ANAEROBIC Blood Culture results may not be optimal due to an inadequate volume of blood received in culture bottles   Culture   Final    NO GROWTH < 24 HOURS Performed at [facility], [address], [place], [place] [address]    Report Status PENDING  Incomplete    I have personally spent 24 minutes involved in face-to-face and non-face-to-face activities for this patient on the day of the visit. Professional time spent includes the following activities: Preparing to see the patient (review of tests), Obtaining and/or reviewing separately obtained history (admission/discharge record), Performing a medically appropriate examination and/or evaluation , Ordering medications/tests/procedures, referring and communicating with other health care professionals, Documenting clinical information in the EMR, Independently interpreting results (not separately reported), Communicating results to the patient/family/caregiver, Counseling and educating the patient/family/caregiver and Care coordination (not separately reported).   Greg Mariaclara Spear, NP [facility]  02/22/2023  12:39 PM

## 2023-02-22 NOTE — Progress Notes (Signed)
     [address]       [place] [address]             [phone]       No events Vitals:   02/22/23 0413 02/22/23 0700  BP: (!) 99/58 (!) 120/56  Pulse: 92 91  Resp: 20 19  Temp: 98.4 F (36.9 C) 98.6 F (37 C)  SpO2: 92% 94%   Alert NAD Sinus  SS CT output  OR today for L VATS, decortication  Delberta Folts MALVA Rayas

## 2023-02-23 ENCOUNTER — Other Ambulatory Visit: Payer: Self-pay

## 2023-02-23 ENCOUNTER — Encounter (HOSPITAL_COMMUNITY)
Admission: EM | Disposition: A | Discharge status: Home / Self Care | Payer: Self-pay | Source: Other Acute Inpatient Hospital | Attending: Internal Medicine

## 2023-02-23 ENCOUNTER — Inpatient Hospital Stay (HOSPITAL_COMMUNITY): Payer: Medicaid Other | Admitting: Certified Registered Nurse Anesthetist

## 2023-02-23 ENCOUNTER — Inpatient Hospital Stay (HOSPITAL_COMMUNITY): Payer: Medicaid Other

## 2023-02-23 DIAGNOSIS — I5021 Acute systolic (congestive) heart failure: Secondary | ICD-10-CM

## 2023-02-23 DIAGNOSIS — J869 Pyothorax without fistula: Secondary | ICD-10-CM

## 2023-02-23 DIAGNOSIS — I4891 Unspecified atrial fibrillation: Secondary | ICD-10-CM

## 2023-02-23 DIAGNOSIS — J9601 Acute respiratory failure with hypoxia: Secondary | ICD-10-CM | POA: Diagnosis not present

## 2023-02-23 DIAGNOSIS — J939 Pneumothorax, unspecified: Secondary | ICD-10-CM | POA: Diagnosis not present

## 2023-02-23 HISTORY — PX: VIDEO ASSISTED THORACOSCOPY (VATS)/DECORTICATION: SHX6171

## 2023-02-23 LAB — BASIC METABOLIC PANEL
Anion gap: 9 (ref 5–15)
BUN: 13 mg/dL (ref 8–23)
CO2: 28 mmol/L (ref 22–32)
Calcium: 8.9 mg/dL (ref 8.9–10.3)
Chloride: 97 mmol/L — ABNORMAL LOW (ref 98–111)
Creatinine, Ser: 0.53 mg/dL — ABNORMAL LOW (ref 0.61–1.24)
GFR, Estimated: >60 mL/min (ref >60)
Glucose, Bld: 96 mg/dL (ref 70–99)
Potassium: 4 mmol/L (ref 3.5–5.1)
Sodium: 134 mmol/L — ABNORMAL LOW (ref 135–145)

## 2023-02-23 LAB — CBC
HCT: 33.5 % — ABNORMAL LOW (ref 39.0–52.0)
Hemoglobin: 10.8 g/dL — ABNORMAL LOW (ref 13.0–17.0)
MCH: 29.8 pg (ref 26.0–34.0)
MCHC: 32.2 g/dL (ref 30.0–36.0)
MCV: 92.5 fL (ref 80.0–100.0)
Platelets: 340 10*3/uL (ref 150–400)
RBC: 3.62 MIL/uL — ABNORMAL LOW (ref 4.22–5.81)
RDW: 16.3 % — ABNORMAL HIGH (ref 11.5–15.5)
WBC: 5.7 10*3/uL (ref 4.0–10.5)
nRBC: 0 % (ref 0.0–0.2)

## 2023-02-23 SURGERY — VIDEO ASSISTED THORACOSCOPY (VATS)/DECORTICATION
Anesthesia: General | Laterality: Left

## 2023-02-23 MED ORDER — ACETAMINOPHEN 10 MG/ML IV SOLN
1000.0000 mg | Freq: Once | INTRAVENOUS | Status: AC
Start: 2023-02-23 — End: 2023-02-23
  Administered 2023-02-23: 1000 mg via INTRAVENOUS

## 2023-02-23 MED ORDER — ACETAMINOPHEN 10 MG/ML IV SOLN
INTRAVENOUS | Status: AC
  Filled 2023-02-23: qty 100

## 2023-02-23 MED ORDER — HYDROMORPHONE HCL 1 MG/ML IJ SOLN
INTRAMUSCULAR | Status: AC
  Filled 2023-02-23: qty 1

## 2023-02-23 MED ORDER — LACTATED RINGERS IV SOLN: INTRAVENOUS | Status: DC | PRN

## 2023-02-23 MED ORDER — HYDROMORPHONE HCL 1 MG/ML IJ SOLN
0.2500 mg | INTRAMUSCULAR | Status: DC | PRN
  Administered 2023-02-23 (×2): 0.25 mg via INTRAVENOUS

## 2023-02-23 MED ORDER — ROCURONIUM BROMIDE 10 MG/ML (PF) SYRINGE
PREFILLED_SYRINGE | INTRAVENOUS | Status: DC | PRN
  Administered 2023-02-23: 50 mg via INTRAVENOUS

## 2023-02-23 MED ORDER — SODIUM CHLORIDE 0.9 % IV SOLN: INTRAVENOUS | Status: DC

## 2023-02-23 MED ORDER — ACETAMINOPHEN 500 MG PO TABS
1000.0000 mg | ORAL_TABLET | Freq: Four times a day (QID) | ORAL | Status: AC
  Administered 2023-02-23 – 2023-02-28 (×8): 1000 mg via ORAL
  Filled 2023-02-23 (×10): qty 2

## 2023-02-23 MED ORDER — LACTATED RINGERS IV SOLN
INTRAVENOUS | Status: DC
Start: 2023-02-23 — End: 2023-02-23

## 2023-02-23 MED ORDER — LIDOCAINE 2% (20 MG/ML) 5 ML SYRINGE
INTRAMUSCULAR | Status: DC | PRN
  Administered 2023-02-23: 80 mg via INTRAVENOUS

## 2023-02-23 MED ORDER — OXYCODONE HCL 5 MG PO TABS
5.0000 mg | ORAL_TABLET | ORAL | Status: DC | PRN
  Administered 2023-02-24 – 2023-02-28 (×6): 10 mg via ORAL
  Filled 2023-02-23 (×7): qty 2

## 2023-02-23 MED ORDER — KETOROLAC TROMETHAMINE 15 MG/ML IJ SOLN
15.0000 mg | Freq: Four times a day (QID) | INTRAMUSCULAR | Status: AC
  Administered 2023-02-23 – 2023-02-25 (×8): 15 mg via INTRAVENOUS
  Filled 2023-02-23 (×8): qty 1

## 2023-02-23 MED ORDER — PHENYLEPHRINE 80 MCG/ML (10ML) SYRINGE FOR IV PUSH (FOR BLOOD PRESSURE SUPPORT)
PREFILLED_SYRINGE | INTRAVENOUS | Status: DC | PRN
  Administered 2023-02-23: 80 ug via INTRAVENOUS
  Administered 2023-02-23: 400 ug via INTRAVENOUS

## 2023-02-23 MED ORDER — SPIRONOLACTONE 12.5 MG HALF TABLET
12.5000 mg | ORAL_TABLET | Freq: Every day | ORAL | Status: DC
  Administered 2023-02-24 – 2023-03-07 (×12): 12.5 mg via ORAL
  Filled 2023-02-23 (×12): qty 1

## 2023-02-23 MED ORDER — ONDANSETRON HCL 4 MG/2ML IJ SOLN
INTRAMUSCULAR | Status: DC | PRN
  Administered 2023-02-23: 4 mg via INTRAVENOUS

## 2023-02-23 MED ORDER — BUPIVACAINE LIPOSOME 1.3 % IJ SUSP
INTRAMUSCULAR | Status: DC | PRN
  Administered 2023-02-23: 50 mL

## 2023-02-23 MED ORDER — MIDAZOLAM HCL 2 MG/2ML IJ SOLN
INTRAMUSCULAR | Status: DC | PRN
  Administered 2023-02-23: 1 mg via INTRAVENOUS

## 2023-02-23 MED ORDER — PROPOFOL 10 MG/ML IV BOLUS
INTRAVENOUS | Status: AC
  Filled 2023-02-23: qty 20

## 2023-02-23 MED ORDER — OXYCODONE HCL 5 MG PO TABS: 5.0000 mg | ORAL_TABLET | Freq: Once | ORAL | Status: DC | PRN

## 2023-02-23 MED ORDER — SUGAMMADEX SODIUM 200 MG/2ML IV SOLN
INTRAVENOUS | Status: DC | PRN
  Administered 2023-02-23: 134.2 mg via INTRAVENOUS

## 2023-02-23 MED ORDER — PHENYLEPHRINE HCL-NACL 20-0.9 MG/250ML-% IV SOLN
INTRAVENOUS | Status: DC | PRN
  Administered 2023-02-23: 75 ug/min via INTRAVENOUS

## 2023-02-23 MED ORDER — MEPERIDINE HCL 25 MG/ML IJ SOLN: 6.2500 mg | INTRAMUSCULAR | Status: DC | PRN

## 2023-02-23 MED ORDER — GABAPENTIN 300 MG PO CAPS
300.0000 mg | ORAL_CAPSULE | Freq: Every day | ORAL | Status: AC
  Administered 2023-02-23 – 2023-02-25 (×3): 300 mg via ORAL
  Filled 2023-02-23 (×3): qty 1

## 2023-02-23 MED ORDER — FENTANYL CITRATE (PF) 250 MCG/5ML IJ SOLN
INTRAMUSCULAR | Status: AC
  Filled 2023-02-23: qty 5

## 2023-02-23 MED ORDER — DEXAMETHASONE SODIUM PHOSPHATE 10 MG/ML IJ SOLN
INTRAMUSCULAR | Status: DC | PRN
  Administered 2023-02-23: 10 mg via INTRAVENOUS

## 2023-02-23 MED ORDER — ALBUMIN HUMAN 5 % IV SOLN: INTRAVENOUS | Status: DC | PRN

## 2023-02-23 MED ORDER — ORAL CARE MOUTH RINSE: 15.0000 mL | Freq: Once | OROMUCOSAL | Status: DC

## 2023-02-23 MED ORDER — OXYCODONE HCL 5 MG/5ML PO SOLN: 5.0000 mg | Freq: Once | ORAL | Status: DC | PRN

## 2023-02-23 MED ORDER — MIDAZOLAM HCL 2 MG/2ML IJ SOLN
0.5000 mg | Freq: Once | INTRAMUSCULAR | Status: DC | PRN
Start: 2023-02-23 — End: 2023-02-23

## 2023-02-23 MED ORDER — PROPOFOL 10 MG/ML IV BOLUS
INTRAVENOUS | Status: DC | PRN
  Administered 2023-02-23: 90 mg via INTRAVENOUS

## 2023-02-23 MED ORDER — GABAPENTIN 300 MG PO CAPS
300.0000 mg | ORAL_CAPSULE | Freq: Two times a day (BID) | ORAL | Status: DC
  Administered 2023-02-26 – 2023-03-01 (×6): 300 mg via ORAL
  Filled 2023-02-23 (×6): qty 1

## 2023-02-23 MED ORDER — FENTANYL CITRATE (PF) 250 MCG/5ML IJ SOLN
INTRAMUSCULAR | Status: DC | PRN
  Administered 2023-02-23 (×2): 50 ug via INTRAVENOUS
  Administered 2023-02-23: 100 ug via INTRAVENOUS

## 2023-02-23 MED ORDER — BUPIVACAINE HCL (PF) 0.5 % IJ SOLN
INTRAMUSCULAR | Status: AC
Start: 2023-02-23 — End: ?
  Filled 2023-02-23: qty 30

## 2023-02-23 MED ORDER — CHLORHEXIDINE GLUCONATE 0.12 % MT SOLN: 15.0000 mL | Freq: Once | OROMUCOSAL | Status: DC

## 2023-02-23 MED ORDER — ACETAMINOPHEN 160 MG/5ML PO SOLN: 1000.0000 mg | Freq: Four times a day (QID) | ORAL | Status: AC

## 2023-02-23 MED ORDER — MIDAZOLAM HCL 2 MG/2ML IJ SOLN
INTRAMUSCULAR | Status: AC
  Filled 2023-02-23: qty 2

## 2023-02-23 MED ORDER — BUPIVACAINE LIPOSOME 1.3 % IJ SUSP
INTRAMUSCULAR | Status: AC
  Filled 2023-02-23: qty 20

## 2023-02-23 SURGICAL SUPPLY — 62 items
BLADE CLIPPER SURG (BLADE) IMPLANT
BLADE SURG 11 STRL SS (BLADE) ×1 IMPLANT
CANISTER SUCT 3000ML PPV (MISCELLANEOUS) ×1 IMPLANT
CATH THORACIC 28FR (CATHETERS) IMPLANT
CATH THORACIC 36FR (CATHETERS) IMPLANT
CHLORAPREP W/TINT 26 (MISCELLANEOUS) ×1 IMPLANT
CLEANER TIP ELECTROSURG 2X2 (MISCELLANEOUS) IMPLANT
CNTNR URN SCR LID CUP LEK RST (MISCELLANEOUS) ×2 IMPLANT
DEFOGGER SCOPE WARMER CLEARIFY (MISCELLANEOUS) ×1 IMPLANT
DERMABOND ADVANCED .7 DNX12 (GAUZE/BANDAGES/DRESSINGS) ×1 IMPLANT
DISSECTOR BLUNT TIP ENDO 5MM (MISCELLANEOUS) IMPLANT
DRAIN CHANNEL 28F RND 3/8 FF (WOUND CARE) IMPLANT
DRAPE CV SPLIT W-CLR ANES SCRN (DRAPES) ×1 IMPLANT
DRAPE SURG ORHT 6 SPLT 77X108 (DRAPES) ×1 IMPLANT
DRSG TEGADERM 2-3/8X2-3/4 SM (GAUZE/BANDAGES/DRESSINGS) IMPLANT
ELECT BLADE 4.0 EZ CLEAN MEGAD (MISCELLANEOUS) ×1 IMPLANT
ELECT REM PT RETURN 9FT ADLT (ELECTROSURGICAL) ×1 IMPLANT
ELECTRODE BLDE 4.0 EZ CLN MEGD (MISCELLANEOUS) ×1 IMPLANT
ELECTRODE REM PT RTRN 9FT ADLT (ELECTROSURGICAL) ×1 IMPLANT
GLOVE BIO SURGEON STRL SZ7.5 (GLOVE) ×2 IMPLANT
GOWN STRL REUS W/ TWL LRG LVL3 (GOWN DISPOSABLE) ×2 IMPLANT
GOWN STRL REUS W/ TWL XL LVL3 (GOWN DISPOSABLE) ×1 IMPLANT
KIT BASIN OR (CUSTOM PROCEDURE TRAY) ×1 IMPLANT
KIT SUCTION CATH 14FR (SUCTIONS) ×1 IMPLANT
KIT TURNOVER KIT B (KITS) ×1 IMPLANT
NDL 18GX1X1/2 (RX/OR ONLY) (NEEDLE) ×1 IMPLANT
NDL HYPO 22X1.5 SAFETY MO (MISCELLANEOUS) ×1 IMPLANT
NEEDLE 18GX1X1/2 (RX/OR ONLY) (NEEDLE) IMPLANT
NEEDLE HYPO 22X1.5 SAFETY MO (MISCELLANEOUS) ×1 IMPLANT
NS IRRIG 1000ML POUR BTL (IV SOLUTION) ×2 IMPLANT
PACK CHEST (CUSTOM PROCEDURE TRAY) ×1 IMPLANT
PAD ARMBOARD 7.5X6 YLW CONV (MISCELLANEOUS) ×2 IMPLANT
PASSER SUT SWANSON 36MM LOOP (INSTRUMENTS) IMPLANT
SCISSORS LAP 5X35 DISP (ENDOMECHANICALS) IMPLANT
SPONGE TONSIL 1 RF SGL (DISPOSABLE) IMPLANT
STOPCOCK 4 WAY LG BORE MALE ST (IV SETS) IMPLANT
SUT PROLENE 3 0 SH DA (SUTURE) IMPLANT
SUT PROLENE 4-0 RB1 .5 CRCL 36 (SUTURE) IMPLANT
SUT SILK 1 MH (SUTURE) ×1 IMPLANT
SUT SILK 1 TIES 10X30 (SUTURE) IMPLANT
SUT SILK 2 0SH CR/8 30 (SUTURE) IMPLANT
SUT SILK 3 0SH CR/8 30 (SUTURE) IMPLANT
SUT VIC AB 1 CTX 18 (SUTURE) IMPLANT
SUT VIC AB 1 CTX36XBRD ANBCTR (SUTURE) IMPLANT
SUT VIC AB 2-0 CT1 TAPERPNT 27 (SUTURE) ×1 IMPLANT
SUT VIC AB 2-0 CTX 36 (SUTURE) IMPLANT
SUT VIC AB 3-0 SH 27X BRD (SUTURE) ×1 IMPLANT
SUT VIC AB 3-0 X1 27 (SUTURE) IMPLANT
SUT VICRYL 0 UR6 27IN ABS (SUTURE) ×1 IMPLANT
SUT VICRYL 2 TP 1 (SUTURE) IMPLANT
SWAB COLLECTION DEVICE MRSA (MISCELLANEOUS) IMPLANT
SWAB CULTURE ESWAB REG 1ML (MISCELLANEOUS) IMPLANT
SYR 10ML LL (SYRINGE) IMPLANT
SYR 20ML LL LF (SYRINGE) ×1 IMPLANT
SYR 50ML LL SCALE MARK (SYRINGE) IMPLANT
SYSTEM SAHARA CHEST DRAIN ATS (WOUND CARE) ×1 IMPLANT
TAPE CLOTH 4X10 WHT NS (GAUZE/BANDAGES/DRESSINGS) ×1 IMPLANT
TOWEL GREEN STERILE (TOWEL DISPOSABLE) ×2 IMPLANT
TRAY FOLEY SLVR 16FR LF STAT (SET/KITS/TRAYS/PACK) ×1 IMPLANT
TROCAR Z THREAD OPTICAL 12X100 (TROCAR) IMPLANT
TUBING EXTENTION W/L.L. (IV SETS) IMPLANT
WATER STERILE IRR 1000ML POUR (IV SOLUTION) ×1 IMPLANT

## 2023-02-23 NOTE — Progress Notes (Addendum)
 Physical Therapy Treatment Patient Details Name: Mathew Wyatt MRN: [id number] DOB: June 25, 1960 Today's Date: 02/23/2023   History of Present Illness 63 yo male adm to [facility] 02/07/23 with SOB, DOE, productive cough with Lt empyema. 1/21 pt transferred to [facility] with placement of 2 chest tubes and went into Afib with RVR. 1/30 s/p 7 doses of pleural lytics. S/P VATS procedure and decortication on 02/23/23. PMH: insomnia, anxiety, CHF, smoker, COPD     Pt is slowly progressing. Procedure today so limited movement. Pt dangled EOB with good drainage from chest tube. Pt was educated on importance of mobility since procedure per MD orders and pt stated understanding. Pt was Min A for supine to sitting and CGA for sitting to supine. Pt was able to sit EOB for 6 min. Due to pt current functional status, home set up and available assistance at home recommending skilled physical therapy services 3x/week in order to address strength, balance and functional mobility to decrease risk for falls, injury and re-hospitalization.      If plan is discharge home, recommend the following: Help with stairs or ramp for entrance;Assistance with cooking/housework;Assist for transportation;A little help with walking and/or transfers     Equipment Recommendations  Rolling walker (2 wheels)       Precautions / Restrictions Precautions Precautions: Fall Precaution Comments: 1 chest tube Restrictions Weight Bearing Restrictions Per Provider Order: No     Mobility  Bed Mobility Overal bed mobility: Needs Assistance Bed Mobility: Rolling, Sidelying to Sit, Sit to Supine Rolling: Supervision Sidelying to sit: Min assist   Sit to supine: Contact guard assist   General bed mobility comments: increased time. Min A for trunk with HOB elevated for supine to sitting EOB.    Transfers     General transfer comment: Pt declined at this time. Order for dangled EOB 2x then OOB POD 0. Pt educated to dangle EOB 1 more time then OOB  for dinner; stated understanding. Pt Order for ambulation 3x POD 1 Pt was educated on this order as well.    Ambulation/Gait   General Gait Details: did not perform at this time due to recent surgery.      Balance Overall balance assessment: Mild deficits observed, not formally tested Sitting-balance support: Feet unsupported, No upper extremity supported, Single extremity supported Sitting balance-Leahy Scale: Fair Sitting balance - Comments: No overt LOB, pt declined scooting EOB for feet on floor.        Cognition Arousal: Lethargic Behavior During Therapy: WFL for tasks assessed/performed Overall Cognitive Status: Within Functional Limits for tasks assessed           General Comments General comments (skin integrity, edema, etc.): Pt HR/O2 sats remained WNL on 2 L O2 via Simple mask throughout session.      Pertinent Vitals/Pain Pain Assessment Pain Assessment: 0-10 Pain Score: 8  Pain Location: chest tubes/flank Pain Descriptors / Indicators: Aching Pain Intervention(s): Limited activity within patient's tolerance, Monitored during session     PT Goals (current goals can now be found in the care plan section) Acute Rehab PT Goals Patient Stated Goal: get stronger and go home PT Goal Formulation: With patient Time For Goal Achievement: 03/02/23 Potential to Achieve Goals: Good Progress towards PT goals: Progressing toward goals    Frequency    Min 1X/week      PT Plan  Continue with current POC        AM-PAC PT 6 Clicks Mobility   Outcome Measure  Help needed turning from your  back to your side while in a flat bed without using bedrails?: A Little Help needed moving from lying on your back to sitting on the side of a flat bed without using bedrails?: A Little Help needed moving to and from a bed to a chair (including a wheelchair)?: A Little Help needed standing up from a chair using your arms (e.g., wheelchair or bedside chair)?: A Little Help  needed to walk in hospital room?: A Little Help needed climbing 3-5 steps with a railing? : A Lot 6 Click Score: 17    End of Session   Activity Tolerance: Patient tolerated treatment well Patient left: in bed;with call bell/phone within reach Nurse Communication: Mobility status PT Visit Diagnosis: Unsteadiness on feet (R26.81);Muscle weakness (generalized) (M62.81);Difficulty in walking, not elsewhere classified (R26.2)     Time: 8679-8670 PT Time Calculation (min) (ACUTE ONLY): 9 min  Charges:    $Therapeutic Activity: 8-22 mins PT General Charges $$ ACUTE PT VISIT: 1 Visit                     Dorothyann Maier, DPT, CLT  Acute Rehabilitation Services Office: [phone] (Secure chat preferred)    Dorothyann VEAR Maier 02/23/2023, 2:13 PM

## 2023-02-23 NOTE — Progress Notes (Signed)
 NAME:  Mathew Wyatt, MRN:  [id number], DOB:  May 16, 1960, LOS: 15 ADMISSION DATE:  02/08/2023, CONSULTATION DATE: 02/08/2023  REFERRING MD: Add, CHIEF COMPLAINT: Hypotension, atrial fibrillation, large empyema on left with hydropneumothorax  History of Present Illness:  63 year old male with past medical history of tobacco use, COPD who presented to [facility] on 1/20 with cough and shortness of breath. Reportedly having worsening symptoms over the preceding 2 weeks. He denied having fever, chills, chest pain, n/v/d. He was found to be tachypneic with significant leukocytosis to 35.7, lactifc 4.4. he was started on IV antibiotics. He had CT chest which demonstrated a large left-sided loculated hydropneumothorax concerning for empyema or abscess. He was transferred to [facility] on 1/21 initially to hospitalist for CT surgery consult and possible VATS. On 1/21 he developed atrial fibrillation with RVR and hypotension and was transferred to [facility] and admitted to ICU.   Pertinent  Medical History  History reviewed. No pertinent past medical history.   Significant Hospital Events: Including procedures, antibiotic start and stop dates in addition to other pertinent events   1/20: [facility] ED with SOB. CT with large left hydropneumo c/w empyema vs abscess.  1/21: TCTS defer VATS. ICU for afib rvr hypotension, started on amiodarone . CT placed with immediate 2L purulent output. Initial tube lytics with additional 300cc output. ECHO neg for vegetation, EF 45-50% 1/22: repeat CT chest today to eval for need of additional CT vs more lytics vs observation.  1/23 new chest tube placed by IR, cultures growing Campylobacter and Peptostreptococcus 1/26 dec'd pleural output but has persistent loculated fluid collection  1/27 AF w/ RVR. Treated w/ BB. Seen by ID; vanc stopped, switched to ceftriaxone , flagyl  and azith for campylobacter. CT chest obtained. The anterior fluid collection has improved. The lateral effusion vol is  less but loculated air maintains. Now has Right layering effusion, and new patchy areas of GG changes. Some mild dec in left GG changes. Repeated round 4 pleural lytics (administered in right lateral tube)  1/28: lateral CT 120 net neg after accounting for TPA. AM cxr possible marginal Improvement in loculated ptx. 1/29 no significant change after 5 doses of pleural lytic therapy.  Discussed case again with thoracic surgery who will reevaluate for possible VATS.  Lateral chest tube became dislodged, removed. 1/30 s/p 7 doses of pleural lytics. ~150 mL output from chest tube overnight. 1/31 Essentially unchanged may be small improvement in rind and small improvement in pneumothorax  2/1 no issues overnight, 200 out of chest tube in last 24 hours 2/2 Output in last 24 hours 80 cc 2/5 S/p VATS/decortication  Interim History / Subjective:  Post VATS/decortication this AM. Chest tube output 200 cc immediate post-op Weaned to 2L AM Objective   Blood pressure 111/66, pulse 93, temperature (!) 97.5 F (36.4 C), resp. rate 18, height 6' (1.829 m), weight 67.1 kg, SpO2 98%.        Intake/Output Summary (Last 24 hours) at 02/23/2023 1349 Last data filed at 02/23/2023 1145 Gross per 24 hour  Intake 1840 ml  Output 1625 ml  Net 215 ml   Filed Weights   02/21/23 0400 02/22/23 0413 02/23/23 0704  Weight: 74.4 kg 75.6 kg 67.1 kg    Physical Exam: General: Well-appearing, no acute distress HENT: [place], AT Eyes: EOMI, no scleral icterus Respiratory: Diminished but clear to auscultation bilaterally.  No crackles, wheezing or rales. Left chest tube in place Cardiovascular: RRR, -M/R/G, no JVD Extremities:-Edema,-tenderness Neuro: AAO x4, CNII-XII grossly intact Psych: Normal  mood, normal affect  WBC normal  Resolved Problems   Sepsis  Af w/ RVR  Assessment & Plan:   Acute hypoxemic respiratory failure - improving Sepsis secondary to Campylobacter/Peptostreptococcus/Streptococcus constellatus  empyema Pneumothorax due to lung entrapment, lack of lung expansion S/p chest tube x 2, currently only one in place S/p intrapleural lytics x 7 doses. Last given 1/29 S/p VATS/decortication P: --TCTS replaced left chest tube --Continue chest tube -20 cm H20 --Defer to TCTS for chest tube management --Continue antibiotics. Cefepime  and Flagyl . ID following  Pulmonary available as needed  Signature   Care Time: 25 min  Slater Staff, M.D. [facility] Pulmonary/Critical Care Medicine 02/23/2023 1:49 PM   See Amion for personal pager For hours between 7 PM to 7 AM, please call Elink for urgent questions

## 2023-02-23 NOTE — Plan of Care (Signed)
  Problem: Fluid Volume: Goal: Hemodynamic stability will improve Outcome: Progressing   Problem: Clinical Measurements: Goal: Diagnostic test results will improve Outcome: Progressing Goal: Signs and symptoms of infection will decrease Outcome: Progressing   Problem: Respiratory: Goal: Ability to maintain adequate ventilation will improve Outcome: Progressing   Problem: Education: Goal: Knowledge of General Education information will improve Description: Including pain rating scale, medication(s)/side effects and non-pharmacologic comfort measures Outcome: Progressing   Problem: Health Behavior/Discharge Planning: Goal: Ability to manage health-related needs will improve Outcome: Progressing   Problem: Clinical Measurements: Goal: Ability to maintain clinical measurements within normal limits will improve Outcome: Progressing Goal: Will remain free from infection Outcome: Progressing Goal: Diagnostic test results will improve Outcome: Progressing Goal: Respiratory complications will improve Outcome: Progressing Goal: Cardiovascular complication will be avoided Outcome: Progressing   Problem: Activity: Goal: Risk for activity intolerance will decrease Outcome: Progressing   Problem: Nutrition: Goal: Adequate nutrition will be maintained Outcome: Progressing   Problem: Coping: Goal: Level of anxiety will decrease Outcome: Progressing   Problem: Elimination: Goal: Will not experience complications related to bowel motility Outcome: Progressing Goal: Will not experience complications related to urinary retention Outcome: Progressing   Problem: Pain Managment: Goal: General experience of comfort will improve and/or be controlled Outcome: Progressing   Problem: Safety: Goal: Ability to remain free from injury will improve Outcome: Progressing   Problem: Skin Integrity: Goal: Risk for impaired skin integrity will decrease Outcome: Progressing   Problem:  Education: Goal: Knowledge of disease or condition will improve Outcome: Progressing Goal: Knowledge of the prescribed therapeutic regimen will improve Outcome: Progressing   Problem: Activity: Goal: Ability to tolerate increased activity will improve Outcome: Progressing Goal: Will verbalize the importance of balancing activity with adequate rest periods Outcome: Progressing   Problem: Respiratory: Goal: Ability to maintain a clear airway will improve Outcome: Progressing Goal: Levels of oxygenation will improve Outcome: Progressing Goal: Ability to maintain adequate ventilation will improve Outcome: Progressing   Problem: Education: Goal: Knowledge of disease or condition will improve Outcome: Progressing Goal: Knowledge of the prescribed therapeutic regimen will improve Outcome: Progressing   Problem: Activity: Goal: Risk for activity intolerance will decrease Outcome: Progressing   Problem: Cardiac: Goal: Will achieve and/or maintain hemodynamic stability Outcome: Progressing   Problem: Clinical Measurements: Goal: Postoperative complications will be avoided or minimized Outcome: Progressing   Problem: Respiratory: Goal: Respiratory status will improve Outcome: Progressing   Problem: Pain Management: Goal: Pain level will decrease Outcome: Progressing   Problem: Skin Integrity: Goal: Wound healing without signs and symptoms infection will improve Outcome: Progressing

## 2023-02-23 NOTE — Transfer of Care (Signed)
 Immediate Anesthesia Transfer of Care Note  Patient: Mathew Wyatt  Procedure(s) Performed: VIDEO ASSISTED THORACOSCOPY (VATS)/DECORTICATION (Left)  Patient Location: PACU  Anesthesia Type:General  Level of Consciousness: awake, alert , and oriented  Airway & Oxygen Therapy: Patient Spontanous Breathing and Patient connected to face mask oxygen  Post-op Assessment: Report given to RN, Post -op Vital signs reviewed and stable, and Patient moving all extremities X 4  Post vital signs: Reviewed and stable  Last Vitals:  Vitals Value Taken Time  BP 105/60 02/23/23 1055  Temp 36.4 C 02/23/23 1055  Pulse 102 02/23/23 1059  Resp 25 02/23/23 1059  SpO2 97 % 02/23/23 1059  Vitals shown include unfiled device data.  Last Pain:  Vitals:   02/23/23 0840  TempSrc:   PainSc: 4       Patients Stated Pain Goal: 2 (02/23/23 0840)  Complications: No notable events documented.

## 2023-02-23 NOTE — Anesthesia Procedure Notes (Addendum)
 Arterial Line Insertion Start/End2/05/2023 9:13 AM, 02/23/2023 9:13 AM Performed by: Erma Thom SAUNDERS, MD, anesthesiologist  Patient location: OR. Preanesthetic checklist: patient identified, IV checked, site marked, risks and benefits discussed, surgical consent, monitors and equipment checked, pre-op evaluation, timeout performed and anesthesia consent Right, radial was placed Catheter size: 20 G Hand hygiene performed  and maximum sterile barriers used   Attempts: 1 Procedure performed using ultrasound guided technique. Ultrasound Notes:anatomy identified, needle tip was noted to be adjacent to the nerve/plexus identified, no ultrasound evidence of intravascular and/or intraneural injection and image(s) printed for medical record Following insertion, dressing applied and Biopatch. Post procedure assessment: normal  Patient tolerated the procedure well with no immediate complications.

## 2023-02-23 NOTE — Plan of Care (Signed)
  Problem: Clinical Measurements: Goal: Diagnostic test results will improve Outcome: Progressing Goal: Respiratory complications will improve Outcome: Progressing Goal: Cardiovascular complication will be avoided Outcome: Progressing   Problem: Activity: Goal: Risk for activity intolerance will decrease Outcome: Progressing   Problem: Coping: Goal: Level of anxiety will decrease Outcome: Progressing   Problem: Pain Managment: Goal: General experience of comfort will improve and/or be controlled Outcome: Progressing   Problem: Safety: Goal: Ability to remain free from injury will improve Outcome: Progressing

## 2023-02-23 NOTE — Progress Notes (Signed)
 PROGRESS NOTE  Mathew Wyatt FMW:[id number] DOB: 1960-06-06 DOA: 02/08/2023 PCP: Patient, No Pcp Per   LOS: 15 days   Brief narrative:  Mathew Wyatt is a 63 y.o. male with past medical history of COPD and a smoker who had initially presented to [facility] hospital on 02/07/2023 with shortness of breath dyspnea on exertion with productive cough.  He was noted to be tachypneic with significant leukocytosis and elevated lactate.  Patient was started on IV antibiotic.  CT scan of the chest however was concerning for empyema versus abscess so he was transferred to [facility] on 02/08/2023 for further evaluation.  Patient however went into A-fib with RVR and hypotension and was admitted to ICU service.   1/21: TCTS defer VATS. ICU for afib rvr hypotension, started on amiodarone . CT placed with immediate 2L purulent output. Initial tube lytics with additional 300cc output. ECHO neg for vegetation, EF 45-50% 1/22: repeat CT chest 1/23 new chest tube placed by IR, cultures growing Campylobacter and Peptostreptococcus 1/26 dec'd pleural output but has persistent loculated fluid collection  1/27 AF w/ RVR. Treated w/ BB. Seen by ID; vanc stopped, switched to ceftriaxone , flagyl  and azith for campylobacter. CT chest obtained. The anterior fluid collection has improved. The lateral effusion vol is less but loculated air maintains. Now has Right layering effusion, and new patchy areas of GG changes. Some mild dec in left GG changes. Repeated round 4 pleural lytics (administered in right lateral tube)  1/28: lateral CT 120 net neg  1/30 no significant change after 7 doses of pleural lytic therapy.  -1/30, seen by Dr. Shyrl, patient refused VATS -1/31: Changed his mind, now agrees to surgery, TCTS notified -2/1: PCCM restarted Heparin  gtt -2/3, hemoglobin down to 6.0, heparin  held, transfusing, T CTS eval pending -2/4, reevaluated by Dr. Shyrl, plan for VATS  Subjective: -Feels poorly, some cough,  denies dyspnea today  Assessment/Plan:  Sepsis secondary to left empyema, pneumonia Acute hypoxic respiratory failure -Status post chest tube placement x2.  Second chest tube placement by IR 02/10/2023.  -Was on ceftriaxone , Flagyl  and azithromycin , followed by Unasyn  per ID then changed to cefepime /metronidazole  per PCCM 2/1 -Orthopantogram with dental caries, unfortunately no inpatient dental coverage available now, would need outpatient follow-up -Pleural fluid cultures initially grew Campylobacter and Peptostreptococcus  -Repeat cultures with Streptococcus constellatus, pansensitive -CT surgery, pulmonary and ID following.    -Has completed 7 doses of fibrinolytic therapy without considerable improvement, T CTS consulted, seen by Dr. Shyrl 1/30 patient refused VATS/decortication,  -1/31 changed his mind, willing to have surgery, T CTS notified 1/31 -Plan for VATS/decortication today -Continue current antibiotics, follow-up or culture  New onset atrial fibrillation with RVR.   -Was on amiodarone  drip, then changed to p.o., back on amiodarone  gtt. again, continue metoprolol , RVR likely driven by empyema, has converted to sinus rhythm, changed to oral amiodarone  -we were holding heparin  with empyema and ongoing fibrinolytic therapy -2/1 back on heparin , now held again  Acute on chronic anemia -Hemoglobin trending down, repeat chest x-ray, possibly has hemopneumothorax -Transfusing PRBC, hold heparin , defer anticoagulation in the setting of empyema, chest tube -Monitor hemoglobin  Acute systolic congestive heart failure with moderately reduced ejection fraction.  2D echocardiogram 45 to 50% with global hypokinesis. 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  Continue nebulizers.   insomnia anxiety.  On muscle relaxant Atarax  Ambien  and pain medication -Add  Xanax   as needed   DVT prophylaxis: SCDs  disposition: Home pending resolution of empyema    Code Status:    Code Status: Full Code  Family Communication: None at bedside  Consultants: CT surgery PCCM Interventional radiology Cardiology  Procedures: Left-sided chest tube placement x 2. Fibrinolytic treatment x 3  Anti-infectives:  Rocephin  Flagyl  and Zithromax .  Anti-infectives (From admission, onward)    Start     Dose/Rate Route Frequency Ordered Stop   02/20/23 1615  [MAR Hold]  metroNIDAZOLE  (FLAGYL ) tablet 500 mg        (MAR Hold since Wed 02/23/2023 at 0855.Hold Reason: Transfer to a Procedural area)   500 mg Oral Every 8 hours 02/20/23 1516     02/20/23 1615  [MAR Hold]  ceFEPIme  (MAXIPIME ) 2 g in sodium chloride  0.9 % 100 mL IVPB        (MAR Hold since Wed 02/23/2023 at 0855.Hold Reason: Transfer to a Procedural area)   2 g 200 mL/hr over 30 Minutes Intravenous Every 8 hours 02/20/23 1519     02/17/23 2000  Ampicillin -Sulbactam (UNASYN ) 3 g in sodium chloride  0.9 % 100 mL IVPB  Status:  Discontinued        3 g 200 mL/hr over 30 Minutes Intravenous Every 6 hours 02/17/23 0917 02/20/23 1516   02/14/23 2200  metroNIDAZOLE  (FLAGYL ) tablet 500 mg  Status:  Discontinued        500 mg Oral Every 12 hours 02/14/23 1412 02/17/23 0917   02/14/23 2000  cefTRIAXone  (ROCEPHIN ) 2 g in sodium chloride  0.9 % 100 mL IVPB  Status:  Discontinued        2 g 200 mL/hr over 30 Minutes Intravenous Every 24 hours 02/14/23 1410 02/17/23 0917   02/14/23 1500  [MAR Hold]  azithromycin  (ZITHROMAX ) tablet 500 mg        (MAR Hold since Wed 02/23/2023 at 0855.Hold Reason: Transfer to a Procedural area)   500 mg Oral Daily 02/14/23 1410     02/12/23 0200  vancomycin  (VANCOREADY) IVPB 1500 mg/300 mL  Status:  Discontinued        1,500 mg 150 mL/hr over 120 Minutes Intravenous Every 12 hours 02/11/23 1301 02/14/23 1410   02/11/23 1430  vancomycin  (VANCOCIN ) 750 mg in sodium chloride  0.9 % 250 mL IVPB   Status:  Discontinued        750 mg 265 mL/hr over 60 Minutes Intravenous  Once 02/11/23 1309 02/11/23 1354   02/11/23 1430  vancomycin  (VANCOCIN ) 750 mg in sodium chloride  0.9 % 250 mL IVPB        750 mg 250 mL/hr over 60 Minutes Intravenous  Once 02/11/23 1354 02/11/23 1543   02/11/23 1400  vancomycin  (VANCOREADY) IVPB 750 mg/150 mL  Status:  Discontinued        750 mg 150 mL/hr over 60 Minutes Intravenous  Once 02/11/23 1301 02/11/23 1309   02/10/23 2130  metroNIDAZOLE  (FLAGYL ) IVPB 500 mg  Status:  Discontinued        500 mg 100 mL/hr over 60 Minutes Intravenous Every 12 hours 02/10/23 1434 02/14/23 1410   02/08/23 1730  metroNIDAZOLE  (FLAGYL ) IVPB 500 mg  Status:  Discontinued        500 mg 100 mL/hr over 60 Minutes Intravenous Every 8 hours 02/08/23 1637 02/10/23 1434   02/08/23 0400  vancomycin  (VANCOREADY) IVPB 750 mg/150 mL  Status:  Discontinued        750 mg 150 mL/hr over 60 Minutes Intravenous Every 12 hours 02/08/23 0324  02/08/23 0328   02/08/23 0400  ceFEPIme  (MAXIPIME ) 2 g in sodium chloride  0.9 % 100 mL IVPB  Status:  Discontinued        2 g 200 mL/hr over 30 Minutes Intravenous Every 8 hours 02/08/23 0324 02/14/23 1410   02/08/23 0400  vancomycin  (VANCOCIN ) 750 mg in sodium chloride  0.9 % 250 mL IVPB  Status:  Discontinued        750 mg 250 mL/hr over 60 Minutes Intravenous Every 12 hours 02/08/23 0328 02/11/23 1258       Objective: Vitals:   02/23/23 0700 02/23/23 0713  BP: 112/67   Pulse: 94   Resp: (!) 22   Temp: 97.8 F (36.6 C) 97.8 F (36.6 C)  SpO2: 90%     Intake/Output Summary (Last 24 hours) at 02/23/2023 1038 Last data filed at 02/23/2023 1031 Gross per 24 hour  Intake 840 ml  Output 1775 ml  Net -935 ml   Filed Weights   02/21/23 0400 02/22/23 0413 02/23/23 0704  Weight: 74.4 kg 75.6 kg 67.1 kg   Body mass index is 20.06 kg/m.   Physical Exam:  Chronically ill male sitting up in bed, AAOx3 HEENT: No JVD CVS: S1-S2, irregular  rhythm Lungs: Rhonchi on the left side, chest tube anteriorly Abdomen: Soft, nontender, bowel sounds present Extremities: No edema  Data Review: I have personally reviewed the following laboratory data and studies,  CBC: Recent Labs  Lab 02/20/23 0320 02/21/23 0440 02/21/23 1535 02/22/23 0711 02/23/23 0622  WBC 12.1* 6.5 7.6 7.1 5.7  HGB 8.5* 6.0* 9.2* 9.8* 10.8*  HCT 26.4* 19.4* 28.4* 29.7* 33.5*  MCV 93.6 96.0 91.3 90.8 92.5  PLT 345 241 303 325 340   Basic Metabolic Panel: Recent Labs  Lab 02/17/23 0848 02/18/23 0401 02/19/23 0440 02/20/23 0320 02/21/23 0440 02/22/23 0711 02/23/23 0622  NA 134*   < > 134* 132* 136 133* 134*  K 3.8   < > 4.0 4.3 3.1* 4.2 4.0  CL 104   < > 102 100 104 96* 97*  CO2 23   < > 24 26 22 27 28   GLUCOSE 105*   < > 194* 187* 112* 97 96  BUN 11   < > 7* 11 11 9 13   CREATININE 0.68   < > 0.59* 0.73 0.50* 0.55* 0.53*  CALCIUM 7.8*   < > 8.1* 7.8* 6.7* 8.6* 8.9  MG 1.7  --  1.7  --   --   --   --    < > = values in this interval not displayed.   Liver Function Tests: No results for input(s): AST, ALT, ALKPHOS, BILITOT, PROT, ALBUMIN  in the last 168 hours.  No results for input(s): LIPASE, AMYLASE in the last 168 hours.  No results for input(s): AMMONIA in the last 168 hours. Cardiac Enzymes: No results for input(s): CKTOTAL, CKMB, CKMBINDEX, TROPONINI in the last 168 hours. BNP (last 3 results) No results for input(s): BNP in the last 8760 hours.  ProBNP (last 3 results) No results for input(s): PROBNP in the last 8760 hours.  CBG: Recent Labs  Lab 02/20/23 2352  GLUCAP 132*   Recent Results (from the past 240 hours)  Body fluid culture w Gram Stain     Status: None   Collection Time: 02/17/23  9:04 AM   Specimen: Pleura; Body Fluid  Result Value Ref Range Status   Specimen Description PLEURAL  Final   Special Requests body fluid  Final   Gram  Stain   Final    FEW WBC PRESENT, PREDOMINANTLY  PMN NO ORGANISMS SEEN    Culture   Final    NO GROWTH 3 DAYS Performed at [facility], [address], [place], [place] [address]    Report Status 02/20/2023 FINAL  Final  Respiratory (~20 pathogens) panel by PCR     Status: None   Collection Time: 02/20/23  4:42 PM   Specimen: Nasopharyngeal Swab; Respiratory  Result Value Ref Range Status   Adenovirus NOT DETECTED NOT DETECTED Final   Coronavirus 229E NOT DETECTED NOT DETECTED Final    Comment: (NOTE) The Coronavirus on the Respiratory Panel, DOES NOT test for the novel  Coronavirus (2019 nCoV)    Coronavirus HKU1 NOT DETECTED NOT DETECTED Final   Coronavirus NL63 NOT DETECTED NOT DETECTED Final   Coronavirus OC43 NOT DETECTED NOT DETECTED Final   Metapneumovirus NOT DETECTED NOT DETECTED Final   Rhinovirus / Enterovirus NOT DETECTED NOT DETECTED Final   Influenza A NOT DETECTED NOT DETECTED Final   Influenza B NOT DETECTED NOT DETECTED Final   Parainfluenza Virus 1 NOT DETECTED NOT DETECTED Final   Parainfluenza Virus 2 NOT DETECTED NOT DETECTED Final   Parainfluenza Virus 3 NOT DETECTED NOT DETECTED Final   Parainfluenza Virus 4 NOT DETECTED NOT DETECTED Final   Respiratory Syncytial Virus NOT DETECTED NOT DETECTED Final   Bordetella pertussis NOT DETECTED NOT DETECTED Final   Bordetella Parapertussis NOT DETECTED NOT DETECTED Final   Chlamydophila pneumoniae NOT DETECTED NOT DETECTED Final   Mycoplasma pneumoniae NOT DETECTED NOT DETECTED Final    Comment: Performed at [facility], [address], [place], [place] [address]  Culture, blood (Routine X 2) w Reflex to ID Panel     Status: None (Preliminary result)   Collection Time: 02/21/23  8:08 AM   Specimen: BLOOD LEFT HAND  Result Value Ref Range Status   Specimen Description BLOOD LEFT HAND  Final   Special Requests   Final    BOTTLES DRAWN AEROBIC AND ANAEROBIC Blood Culture results may not be optimal due to an inadequate volume of blood received in  culture bottles   Culture   Final    NO GROWTH 2 DAYS Performed at [facility], [address], [place], [place] [address]    Report Status PENDING  Incomplete  Culture, blood (Routine X 2) w Reflex to ID Panel     Status: None (Preliminary result)   Collection Time: 02/21/23  8:27 AM   Specimen: BLOOD RIGHT HAND  Result Value Ref Range Status   Specimen Description BLOOD RIGHT HAND  Final   Special Requests   Final    BOTTLES DRAWN AEROBIC AND ANAEROBIC Blood Culture results may not be optimal due to an inadequate volume of blood received in culture bottles   Culture   Final    NO GROWTH 2 DAYS Performed at [facility], [address] [place], [place] [address]    Report Status PENDING  Incomplete     Studies: No results found.     Sigurd Pac, MD  [facility] 02/23/2023  If 7PM-7AM, please contact night-coverage

## 2023-02-23 NOTE — Op Note (Signed)
     [address]       [name] [address]             [phone]       02/23/2023 Patient:  [name] Pre-Op Dx: empyema   Post-op Dx:  same Procedure: - Left video assisted thoracoscopy - Decortication - Intercostal nerve block  Surgeon and Role:      * Destry Bezdek, Linnie KIDD, MD - Primary    * CHARM Donald - assisting An experienced assistant was required given the complexity of this surgery and the standard of surgical care. The assistant was needed for exposure, dissection, suctioning, retraction of delicate tissues and sutures, instrument exchange and for overall help during this procedure.     Anesthesia  general EBL:  Blood Administration: none Specimen: none  Drains: 25 F argyle chest tube in left chest Counts: correct    Indications: 63yo male admitted with an empyema that originally was treated with tube drainage and lytic therapy.  His oxygen requirements have increased, and has redeveloped a leukocytosis  Findings: Entrapped lower lobe.  Purulence in the posterior gutter.  Operative Technique: After the risks, benefits and alternatives were thoroughly discussed, the patient was brought to the operative theatre.  Anesthesia was induced, the patient was then placed in a right decubitus position and was prepped and draped in normal sterile fashion.  An appropriate surgical pause was performed, and pre-operative antibiotics were dosed accordingly.  We began with 2cm incision in the anterior axillary line at the 5th intercostal space.  The chest was entered, and we then placed a 1cm incision at the 10th intercostal space, and introduced our camera port.  The lung was directly visualized.  The lung was then mobilized off of the chest wall.  The pleural peal was carefully decorticated off of each lobe.  The fissure was then mobilized.  We achieved good expansion of the lung.  The chest was then irrigated.    An intercostal nerve block was  performed under direct visualization.  A 28 F chest tube was then placed, and we watch the lung re-expand.  The skin and soft tissue were closed with absorbable suture    The patient tolerated the procedure without any immediate complications, and was transferred to the PACU in stable condition.  Gram Siedlecki KIDD Rayas

## 2023-02-23 NOTE — Brief Op Note (Signed)
 02/08/2023 - 02/23/2023  10:28 AM  PATIENT:  Mathew Wyatt  63 y.o. male  PRE-OPERATIVE DIAGNOSIS:  Left empyema  POST-OPERATIVE DIAGNOSIS:  Left empyema  PROCEDURE:  LEFT VIDEO ASSISTED THORACOSCOPY (VATS) and DECORTICATION  SURGEON:  Surgeons and Role:    Shyrl Linnie KIDD, MD - Primary  PHYSICIAN ASSISTANT: Kyla Donald PA-C  ANESTHESIA:   general  EBL:  75 mL   BLOOD ADMINISTERED:none  DRAINS:  28 French chest tube placed in the left pleural space    COUNTS CORRECT:  YES  DICTATION: .Dragon Dictation  PLAN OF CARE: Admit to inpatient   PATIENT DISPOSITION:  PACU - hemodynamically stable.   Delay start of Pharmacological VTE agent (>24hrs) due to surgical blood loss or risk of bleeding: no

## 2023-02-23 NOTE — Plan of Care (Signed)
  Problem: Clinical Measurements: Goal: Diagnostic test results will improve Outcome: Progressing Goal: Respiratory complications will improve Outcome: Progressing   Problem: Activity: Goal: Risk for activity intolerance will decrease Outcome: Progressing   Problem: Coping: Goal: Level of anxiety will decrease Outcome: Progressing   Problem: Pain Managment: Goal: General experience of comfort will improve and/or be controlled Outcome: Progressing   Problem: Safety: Goal: Ability to remain free from injury will improve Outcome: Progressing   Problem: Activity: Goal: Ability to tolerate increased activity will improve Outcome: Progressing

## 2023-02-23 NOTE — Anesthesia Postprocedure Evaluation (Signed)
 Anesthesia Post Note  Patient: [name]  Procedure(s) Performed: VIDEO ASSISTED THORACOSCOPY (VATS)/DECORTICATION (Left)     Patient location during evaluation: PACU Anesthesia Type: General Level of consciousness: awake and alert Pain management: pain level controlled Vital Signs Assessment: post-procedure vital signs reviewed and stable Respiratory status: spontaneous breathing, nonlabored ventilation, respiratory function stable and patient connected to nasal cannula oxygen Cardiovascular status: blood pressure returned to baseline and stable Postop Assessment: no apparent nausea or vomiting Anesthetic complications: no   No notable events documented.  Last Vitals:  Vitals:   02/23/23 1245 02/23/23 1300  BP:  111/66  Pulse: 96 93  Resp: 16 18  Temp:    SpO2: 96% 98%    Last Pain:  Vitals:   02/23/23 1145  TempSrc:   PainSc: Asleep                 Thom JONELLE Peoples

## 2023-02-23 NOTE — Progress Notes (Signed)
     [address]       [place] [address]             [phone]       No events Vitals:   02/23/23 0700 02/23/23 0713  BP: 112/67   Pulse: 94   Resp: (!) 22   Temp: 97.8 F (36.6 C) 97.8 F (36.6 C)  SpO2: 90%    Alert NAD Sinus  SS CT output  OR today for L VATS, decortication  Jeffie Spivack MALVA Rayas

## 2023-02-23 NOTE — Discharge Instructions (Addendum)
 Video-Assisted Thoracic Surgery, Care After After a thoracic surgery, it's common to have some pain and soreness in your chest. Or pain when you breathe in or cough. You may also have: Trouble pooping. Tiredness. Trouble sleeping. Follow these instructions at home: Medicines Take your medicines only as told. If you were given antibiotics, take them as told. Do not stop taking them even if you start to feel better. If you have pain, take pain medicine before your pain gets very bad. This is important. Doing this will help you breathe and cough more comfortably. You may need to take steps to help treat or prevent trouble pooping (constipation), such as: Taking medicines to help you poop. Eating foods high in fiber, like beans, whole grains, and fresh fruits and vegetables. Drinking more fluids as told. Ask your health care provider if it's safe to drive or use machines while taking your medicine. Incision care  Take care of the cuts in your chest as told. Make sure you: Wash your hands with soap and water  for at least 20 seconds before and after you change your bandage. If you can't use soap and water , use hand sanitizer. Change your bandage. Leave stitches or skin glue alone. Leave tape strips alone unless you're told to take them off. You may trim the edges of the tape strips if they curl up. Check the cuts on your chest every day for signs of infection. Check for: Redness, swelling, or pain. Fluid or blood. Warmth. Pus or a bad smell. Keep your bandage clean and dry until it is taken off. Activity If you were given a sedative, do not drive or use machines until you're told it's safe. A sedative can make you sleepy. Rest as told. Get up to take short walks at least every 2 hours during the day. This helps you breathe better and keeps your blood flowing. Ask for help if you feel weak or unsteady. Avoid activities that use your chest muscles for at least 3-4 weeks. Ask if it's OK for  you to lift. Do not take baths, swim, or use a hot tub until you're told it's OK. Ask if you can shower. Ask what things are safe for you to do at home. Ask when you can go back to work or school. Preventing lung infection  Take deep breaths and cough often. This opens your lungs and helps clear mucus. Doing this helps prevent lung infection (pneumonia). Use an incentive spirometer if told. This tool shows how much you fill your lungs with each breath. Coughing may hurt less if you try to support your chest. Try one of these when you cough: Hold a pillow on your chest. Place both hands flat on your cut or cuts from surgery. Do not smoke, vape, or use nicotine  or tobacco. Avoid secondhand smoke. Stay away when people are smoking. General instructions If you have a chest tube, care for it as told. A chest tube is also called a drain. Do not travel by airplane during the 2 weeks after your chest tube is taken out, or until your doctor says that this is safe. Your provider may give you more instructions. Make sure you know what you can and can't do. Contact a doctor if: You have any signs of infection in any of your cuts from surgery. Your skin, lips, or fingernails become blue. You have fast breathing. You make high-pitched whistling sounds when you breathe (wheeze). Get help right away if: You have fast, pounding heartbeat. You cough  up blood or pus. You are short of breath. You have trouble breathing or shortness of breath that doesn't go away when you rest. You have sudden chest pain that doesn't go away when you rest. These symptoms may be an emergency. Call 911 right away. Do not wait to see if the symptoms will go away. Do not drive yourself to the hospital. This information is not intended to replace advice given to you by your health care provider. Make sure you discuss any questions you have with your health care provider. Document Revised: 07/13/2022 Document Reviewed:  07/13/2022 Elsevier Patient Education  2024 [employer] Inc.  ------------------------------------------------------- Information on my medicine - ELIQUIS  (apixaban )  Why was Eliquis  prescribed for you? Eliquis  was prescribed for you to reduce the risk of a blood clot forming that can cause a stroke if you have a medical condition called atrial fibrillation (a type of irregular heartbeat).  What do You need to know about Eliquis  ? Take your Eliquis  TWICE DAILY - one tablet in the morning and one tablet in the evening with or without food. If you have difficulty swallowing the tablet whole please discuss with your pharmacist how to take the medication safely.  Take Eliquis  exactly as prescribed by your doctor and DO NOT stop taking Eliquis  without talking to the doctor who prescribed the medication.  Stopping may increase your risk of developing a stroke.  Refill your prescription before you run out.  After discharge, you should have regular check-up appointments with your healthcare provider that is prescribing your Eliquis .  In the future your dose may need to be changed if your kidney function or weight changes by a significant amount or as you get older.  What do you do if you miss a dose? If you miss a dose, take it as soon as you remember on the same day and resume taking twice daily.  Do not take more than one dose of ELIQUIS  at the same time to make up a missed dose.  Important Safety Information A possible side effect of Eliquis  is bleeding. You should call your healthcare provider right away if you experience any of the following: Bleeding from an injury or your nose that does not stop. Unusual colored urine (red or dark brown) or unusual colored stools (red or black). Unusual bruising for unknown reasons. A serious fall or if you hit your head (even if there is no bleeding).  Some medicines may interact with Eliquis  and might increase your risk of bleeding or clotting  while on Eliquis . To help avoid this, consult your healthcare provider or pharmacist prior to using any new prescription or non-prescription medications, including herbals, vitamins, non-steroidal anti-inflammatory drugs (NSAIDs) and supplements.  This website has more information on Eliquis  (apixaban ): http://[email or web address] .com/eliquis dena

## 2023-02-23 NOTE — Anesthesia Procedure Notes (Signed)
 Procedure Name: Intubation Date/Time: 02/23/2023 9:12 AM  Performed by: Vertie Arthea RAMAN, CRNAPre-anesthesia Checklist: Patient identified, Emergency Drugs available, Suction available and Patient being monitored Patient Re-evaluated:Patient Re-evaluated prior to induction Oxygen Delivery Method: Circle System Utilized Preoxygenation: Pre-oxygenation with 100% oxygen Induction Type: IV induction Ventilation: Mask ventilation without difficulty Laryngoscope Size: Mac and 4 Grade View: Grade I Tube type: Oral Endobronchial tube: Left, Double lumen EBT, EBT position confirmed by fiberoptic bronchoscope and EBT position confirmed by auscultation and 39 Fr Number of attempts: 1 Airway Equipment and Method: Stylet and Oral airway Placement Confirmation: ETT inserted through vocal cords under direct vision, positive ETCO2 and breath sounds checked- equal and bilateral Tube secured with: Tape Dental Injury: Teeth and Oropharynx as per pre-operative assessment

## 2023-02-24 ENCOUNTER — Inpatient Hospital Stay (HOSPITAL_COMMUNITY): Payer: Medicaid Other

## 2023-02-24 ENCOUNTER — Encounter (HOSPITAL_COMMUNITY): Payer: Self-pay | Admitting: Thoracic Surgery (Cardiothoracic Vascular Surgery)

## 2023-02-24 LAB — CBC
HCT: 29.6 % — ABNORMAL LOW (ref 39.0–52.0)
Hemoglobin: 9.4 g/dL — ABNORMAL LOW (ref 13.0–17.0)
MCH: 29.9 pg (ref 26.0–34.0)
MCHC: 31.8 g/dL (ref 30.0–36.0)
MCV: 94.3 fL (ref 80.0–100.0)
Platelets: 322 10*3/uL (ref 150–400)
RBC: 3.14 MIL/uL — ABNORMAL LOW (ref 4.22–5.81)
RDW: 15.9 % — ABNORMAL HIGH (ref 11.5–15.5)
WBC: 7.6 10*3/uL (ref 4.0–10.5)
nRBC: 0 % (ref 0.0–0.2)

## 2023-02-24 LAB — BASIC METABOLIC PANEL WITH GFR
Anion gap: 7 (ref 5–15)
BUN: 21 mg/dL (ref 8–23)
CO2: 27 mmol/L (ref 22–32)
Calcium: 8.7 mg/dL — ABNORMAL LOW (ref 8.9–10.3)
Chloride: 102 mmol/L (ref 98–111)
Creatinine, Ser: 0.61 mg/dL (ref 0.61–1.24)
GFR, Estimated: >60 mL/min
Glucose, Bld: 261 mg/dL — ABNORMAL HIGH (ref 70–99)
Potassium: 4.5 mmol/L (ref 3.5–5.1)
Sodium: 136 mmol/L (ref 135–145)

## 2023-02-24 LAB — HEPARIN LEVEL (UNFRACTIONATED): Heparin Unfractionated: 0.31 [IU]/mL (ref 0.30–0.70)

## 2023-02-24 MED ORDER — POLYETHYLENE GLYCOL 3350 17 G PO PACK: 17.0000 g | PACK | Freq: Every day | ORAL | Status: DC | PRN

## 2023-02-24 MED ORDER — SODIUM CHLORIDE 0.9 % IV SOLN
3.0000 g | Freq: Four times a day (QID) | INTRAVENOUS | Status: DC
  Administered 2023-02-24 – 2023-02-25 (×3): 3 g via INTRAVENOUS
  Filled 2023-02-24 (×4): qty 8

## 2023-02-24 MED ORDER — HEPARIN (PORCINE) 25000 UT/250ML-% IV SOLN
1700.0000 [IU]/h | INTRAVENOUS | Status: DC
  Administered 2023-02-24 – 2023-02-25 (×2): 1750 [IU]/h via INTRAVENOUS
  Administered 2023-02-26: 1600 [IU]/h via INTRAVENOUS
  Administered 2023-02-27 (×2): 1550 [IU]/h via INTRAVENOUS
  Administered 2023-02-28: 1600 [IU]/h via INTRAVENOUS
  Administered 2023-03-01: 1650 [IU]/h via INTRAVENOUS
  Administered 2023-03-01: 1600 [IU]/h via INTRAVENOUS
  Filled 2023-02-24 (×9): qty 250

## 2023-02-24 NOTE — Progress Notes (Signed)
      [address]       [name] [address]             [phone]       1 Day Post-Op Procedure(s) (LRB): VIDEO ASSISTED THORACOSCOPY (VATS)/DECORTICATION (Left)  Subjective: Patient does not feel well this am. When asked specifically what he means, I am in pain.  Objective: Vital signs in last 24 hours: Temp:  [97.4 F (36.3 C)-97.8 F (36.6 C)] 97.4 F (36.3 C) (02/06 0307) Pulse Rate:  [72-102] 82 (02/06 0307) Cardiac Rhythm: Normal sinus rhythm (02/06 0700) Resp:  [14-32] 20 (02/06 0307) BP: (94-117)/(58-84) 105/61 (02/06 0307) SpO2:  [91 %-100 %] 91 % (02/06 0307) Arterial Line BP: (136-143)/(51-57) 143/55 (02/05 1130) Weight:  [72.7 kg] 72.7 kg (02/06 0330)    Intake/Output from previous day: 02/05 0701 - 02/06 0700 In: 3605.9 [P.O.:480; I.V.:2225.6; IV Piggyback:900.2] Out: 1375 [Urine:950; Blood:75; Chest Tube:350]   Physical Exam:  Cardiovascular: RRR Pulmonary: Bilateral rhonchi  Abdomen: Soft, non tender, bowel sounds present. Extremities: SCDs in place Wounds: Dressing is clean and dry.   Chest Tube:To suction, intermittent air leak  Lab Results: CBC: Recent Labs    02/23/23 0622 02/24/23 0330  WBC 5.7 7.6  HGB 10.8* 9.4*  HCT 33.5* 29.6*  PLT 340 322   BMET:  Recent Labs    02/23/23 0622 02/24/23 0330  NA 134* 136  K 4.0 4.5  CL 97* 102  CO2 28 27  GLUCOSE 96 261*  BUN 13 21  CREATININE 0.53* 0.61  CALCIUM 8.9 8.7*    PT/INR: No results for input(s): LABPROT, INR in the last 72 hours. ABG:  INR: Will add last result for INR, ABG once components are confirmed Will add last 4 CBG results once components are confirmed  Assessment/Plan:  1. CV - Previous a fib with RVT. SR this am. On Amiodarone  400 mg bid 2.  Pulmonary - On 4 L of oxygen via simple mask. Chest tube with 350 cc last 24 hours. Chest tube is to suction with intermittent air leak. CXR this am difficult to interpret as best image is  sideways;appears small amount of subcutaneous emphysema left lateral chest wall, some decrease in left pleural density. Chest tube to remain to suction for today. Encourage incentive spirometer. 3. ID-On Azithromycin , Cefepime , and Flagyl  for PNA/empyema 4. Regarding pain control, scheduled Toradol  and Neurontin . PRN IV Dilaudid  and PRN Oxy  Mathew Pineiro M ZimmermanPA-C 02/24/2023,7:22 AM

## 2023-02-24 NOTE — Progress Notes (Signed)
 PHARMACY - ANTICOAGULATION CONSULT NOTE  Pharmacy Consult for IV heparin  Indication: atrial fibrillation  No Known Allergies  Patient Measurements: Height: 6' (182.9 cm) Weight: 72.7 kg (160 lb 4.4 oz) IBW/kg (Calculated) : 77.6 Heparin  Dosing Weight: ~ 80 kg  Vital Signs: Temp: 97.6 F (36.4 C) (02/06 1922) Temp Source: Oral (02/06 1922) BP: 106/67 (02/06 1922) Pulse Rate: 83 (02/06 1536)  Labs: Recent Labs    02/22/23 0711 02/23/23 0622 02/24/23 0330 02/24/23 2030  HGB 9.8* 10.8* 9.4*  --   HCT 29.7* 33.5* 29.6*  --   PLT 325 340 322  --   HEPARINUNFRC  --   --   --  0.31  CREATININE 0.55* 0.53* 0.61  --     Estimated Creatinine Clearance: 98.4 mL/min (by C-G formula based on SCr of 0.61 mg/dL).  Assessment: 63 yo male with new onset afib, initially converted to NSR following placement of chest tube however patient had converted back to afib. He was on IV heparin  and this was discontinued with hg 6.0 on 2/2 then it was held for VATs on 2/4  Okay to resume IV heparin  per Dr. Shyrl.  Heparin  drip 1750 uts/hr with heparin  level 0.31 at goal    Goal of Therapy:  Heparin  level 0.3-0.5 Monitor platelets by anticoagulation protocol: Yes   Plan:  Continue heparin  at 1750 units/hr  Daily heparin  level and cbc  Monitor s/s bleeding    Olam Chalk Pharm.D. CPP, BCPS Clinical Pharmacist [phone] 02/24/2023 10:49 PM   **Pharmacist phone directory can now be found on [email or web address] ([email or web address]).  Listed under [facility].

## 2023-02-24 NOTE — Plan of Care (Signed)

## 2023-02-24 NOTE — Progress Notes (Signed)
 PHARMACY - ANTICOAGULATION CONSULT NOTE  Pharmacy Consult for IV heparin  Indication: atrial fibrillation  No Known Allergies  Patient Measurements: Height: 6' (182.9 cm) Weight: 72.7 kg (160 lb 4.4 oz) IBW/kg (Calculated) : 77.6 Heparin  Dosing Weight: ~ 80 kg  Vital Signs: Temp: 97.5 F (36.4 C) (02/06 1106) Temp Source: Oral (02/06 1106) BP: 102/66 (02/06 1106) Pulse Rate: 79 (02/06 1106)  Labs: Recent Labs    02/22/23 0711 02/23/23 0622 02/24/23 0330  HGB 9.8* 10.8* 9.4*  HCT 29.7* 33.5* 29.6*  PLT 325 340 322  CREATININE 0.55* 0.53* 0.61    Estimated Creatinine Clearance: 98.4 mL/min (by C-G formula based on SCr of 0.61 mg/dL).  Assessment: 63 yo male with new onset afib, initially converted to NSR following placement of chest tube however patient had converted back to afib. He was on IV heparin  and this was discontinued with hg 6.0 on 2/2 then it was held for VATs on 2/4  Okay to resume IV heparin  per Dr. Shyrl.  -he was at goal 2550 units/hr -hg= 9.4 -chest tube in place   Goal of Therapy:  Heparin  level 0.3-0.5 Monitor platelets by anticoagulation protocol: Yes   Plan:  -No heparin  bolus -resume heparin  at 1750 units/hr (lower initial rate due to recent OR) with slow titration up -heparin  level in 8 hrs  Prentice Poisson, PharmD Clinical Pharmacist **Pharmacist phone directory can now be found on [email or web address] ([email or web address]).  Listed under [facility].

## 2023-02-24 NOTE — Progress Notes (Signed)
 Physical Therapy Treatment Patient Details Name: Mathew Wyatt MRN: [id number] DOB: [birth year] Today's Date: 02/24/2023   History of Present Illness 63 yo male adm to [facility] 02/07/23 with SOB, DOE, productive cough with Lt empyema. 1/21 pt transferred to [facility] with placement of 2 chest tubes and went into Afib with RVR. 1/30 s/p 7 doses of pleural lytics. Pt s/p VATS and decortication on 02/23/23. PMH: insomnia, anxiety, CHF, smoker, COPD    PT Comments  Pt is progressing towards goals. Pt was able to ambulate 40 ft today at supervision with RW and sit to stand at [facility]. Pt continues to require Min A for bed mobility with HOB elevated. Pt is motivated to improve mobility today. Due to pt current functional status, home set up and available assistance at home recommending skilled physical therapy services 3x/week in order to address strength, balance and functional mobility to decrease risk for falls, injury and re-hospitalization.       If plan is discharge home, recommend the following: Help with stairs or ramp for entrance;Assistance with cooking/housework;Assist for transportation;A little help with walking and/or transfers     Equipment Recommendations  Rolling walker (2 wheels)       Precautions / Restrictions Precautions Precautions: Fall Precaution Comments: 1 chest tube Restrictions Weight Bearing Restrictions Per Provider Order: No     Mobility  Bed Mobility Overal bed mobility: Needs Assistance Bed Mobility: Rolling, Sidelying to Sit, Sit to Supine Rolling: Supervision Sidelying to sit: Min assist   Sit to supine: Supervision   General bed mobility comments: increased time. Min A for trunk with HOB elevated for supine to sitting EOB.    Transfers Overall transfer level: Needs assistance Equipment used: Rolling walker (2 wheels) Transfers: Bed to chair/wheelchair/BSC, Sit to/from Stand Sit to Stand: Contact guard assist   Step pivot transfers: Contact guard assist        General transfer comment: CGA for sit to stand from EOB and BSC with verbal cues for correct hand placement to prevent pulling up from [facility]    Ambulation/Gait Ambulation/Gait assistance: Supervision Gait Distance (Feet): 40 Feet Assistive device: Rolling walker (2 wheels) Gait Pattern/deviations: Step-through pattern, Decreased stride length Gait velocity: decreased Gait velocity interpretation: <1.31 ft/sec, indicative of household ambulator   General Gait Details: Slightly unsteady, Moderately reliant on RW      Balance Overall balance assessment: Mild deficits observed, not formally tested Sitting-balance support: Feet unsupported, No upper extremity supported, Single extremity supported Sitting balance-Leahy Scale: Fair Sitting balance - Comments: No overt LOB   Standing balance support: During functional activity, Bilateral upper extremity supported Standing balance-Leahy Scale: Fair Standing balance comment: UE support no overt LOB, slightly shaky        Cognition Arousal: Alert Behavior During Therapy: WFL for tasks assessed/performed Overall Cognitive Status: Within Functional Limits for tasks assessed     General Comments: motivated today           General Comments General comments (skin integrity, edema, etc.): Pt HR and O2 sats remained WNL on 2L o2 via simple mask throughout session      Pertinent Vitals/Pain Pain Assessment Pain Assessment: Faces Faces Pain Scale: Hurts little more Breathing: normal Pain Location: chest tubes/flank Pain Descriptors / Indicators: Aching Pain Intervention(s): Monitored during session, Limited activity within patient's tolerance, Premedicated before session     PT Goals (current goals can now be found in the care plan section) Acute Rehab PT Goals Patient Stated Goal: get stronger and go home PT Goal Formulation:  With patient Time For Goal Achievement: 03/02/23 Potential to Achieve Goals: Good Progress towards PT  goals: Progressing toward goals    Frequency    Min 1X/week      PT Plan  Continue with current POC        AM-PAC PT 6 Clicks Mobility   Outcome Measure  Help needed turning from your back to your side while in a flat bed without using bedrails?: A Little Help needed moving from lying on your back to sitting on the side of a flat bed without using bedrails?: A Little Help needed moving to and from a bed to a chair (including a wheelchair)?: A Little Help needed standing up from a chair using your arms (e.g., wheelchair or bedside chair)?: A Little Help needed to walk in hospital room?: A Little Help needed climbing 3-5 steps with a railing? : A Little 6 Click Score: 18    End of Session Equipment Utilized During Treatment: Oxygen Activity Tolerance: Patient tolerated treatment well Patient left: in bed;with call bell/phone within reach Nurse Communication: Mobility status PT Visit Diagnosis: Unsteadiness on feet (R26.81);Muscle weakness (generalized) (M62.81);Difficulty in walking, not elsewhere classified (R26.2)     Time: 9054-8989 PT Time Calculation (min) (ACUTE ONLY): 25 min  Charges:    $Therapeutic Activity: 23-37 mins PT General Charges $$ ACUTE PT VISIT: 1 Visit                     Dorothyann Maier, DPT, CLT  Acute Rehabilitation Services Office: [phone] (Secure chat preferred)    Dorothyann VEAR Maier 02/24/2023, 11:09 AM

## 2023-02-24 NOTE — Plan of Care (Signed)
°  Problem: Activity: Goal: Risk for activity intolerance will decrease Outcome: Progressing   Problem: Respiratory: Goal: Respiratory status will improve Outcome: Progressing   Problem: Pain Management: Goal: Pain level will decrease Outcome: Progressing

## 2023-02-24 NOTE — Progress Notes (Signed)
 Mobility Specialist Progress Note;   02/24/23 1415  Mobility  Activity Ambulated with assistance in hallway  Level of Assistance Standby assist, set-up cues, supervision of patient - no hands on  Assistive Device Front wheel walker  Distance Ambulated (ft) 50 ft  Activity Response Tolerated well  Mobility Referral Yes  Mobility visit 1 Mobility  Mobility Specialist Start Time (ACUTE ONLY) 1415  Mobility Specialist Stop Time (ACUTE ONLY) 1435  Mobility Specialist Time Calculation (min) (ACUTE ONLY) 20 min   Pt eager for mobility. On 2LO2 upon arrival. Required MinG assist for bed mobility and SV during ambulation. Ambulated on 2LO2, VSS throughout. No c/o during session. Eager for more mobility. Pt returned sitting on EOB with all needs met.   Lauraine Erm Mobility Specialist Please contact via SecureChat or [employer] [phone]

## 2023-02-24 NOTE — Progress Notes (Signed)
 Pharmacy Antibiotic Note  Mathew Wyatt is a 63 y.o. male  with PNA and empyema.  Pharmacy has been consulted for cefepime  dosing (he is also on Flagyl  and azithromycin ). Had multiple chest tubes placed with large volume drainage, now s/p VATS 2/5; chest tube replaced now per TCTS, post-op CXR decr'd pleural density.  Afebrile/WBC 7.6 (last fever 2/2 101.1), renal function remains stable  Plan: Resume ampicillin /sulbactam 3g q6h IV Continue azithromycin  Discontinue cefepime , metronidazole  Duration of therapy to be determined pending clinical improvement Continue to follow renal function, micro data  Height: 6' (182.9 cm) Weight: 72.7 kg (160 lb 4.4 oz) IBW/kg (Calculated) : 77.6  Temp (24hrs), Avg:97.6 F (36.4 C), Min:97.4 F (36.3 C), Max:97.8 F (36.6 C)  Recent Labs  Lab 02/20/23 0320 02/21/23 0440 02/21/23 1535 02/22/23 0711 02/23/23 0622 02/24/23 0330  WBC 12.1* 6.5 7.6 7.1 5.7 7.6  CREATININE 0.73 0.50*  --  0.55* 0.53* 0.61    Estimated Creatinine Clearance: 98.4 mL/min (by C-G formula based on SCr of 0.61 mg/dL).    No Known Allergies  Antimicrobials this admission: Vanc 1/20 >1/27 CTX 1/27 > 1/30 Cefepime  1/20 > 1/27, 2/2 > 2/6 Flagyl  1/20 > 1/30; 2/2 > 2/6 Azith 1/27 >> Unasyn  1/30 > 2/2; 2/6 >>  Microbiology results: 1/21 BCx: NGF 1/21 MRSA PCR: positive 1/21 pleural fluid: abundant GNR, GPC pairs/chains >> on culture mixed anaerobes >> campylobacter, peptostreptococcus, + others unspeciated, mixed anaerobes 1/21 MRSA: positive 1/23 abscess: streptococcus constellatus - PCN sens 1/30 Pleural: neg  Thank you for allowing pharmacy to be a part of this patient's care.  Randall Call, PharmD PGY2 Critical Care Pharmacy Resident  **Pharmacist phone directory can now be found on [email or web address] ([email or web address]).  Listed under [facility].

## 2023-02-24 NOTE — Progress Notes (Signed)
 [facility]  Date of Admission:  02/08/2023     Total days of antibiotics 17         ASSESSMENT:  Mathew Wyatt is POD #1 from VATS with appropriate chest pain in the setting of empyema. Tolerating antibiotics and having diarrhea. Suspect antibiotic related and will change cefepime /metronidazole  to Unasyn  and continue with current dose of azithromycin . Blood culture from 02/21/23 remain without growth to date. Post-operative wound care per CVTS. Remaining medical and supportive care per Internal Medicine.   PLAN:  Continue current dose azithromycin . Change Cefepime /Metronidazole  to Unasyn .  Post-operative wound care per CVTS.  Monitor blood cultures until finalized. Remaining medical and supportive care per Internal Medicine.   Principal Problem:   Sepsis due to pneumonia [facility]) Active Problems:   Empyema (HCC)   Sepsis associated hypotension (HCC)   Need for management of chest tube   Hydropneumothorax   Pneumonia of left lower lobe due to infectious organism   Pressure injury of skin   Atrial fibrillation with rapid ventricular response (HCC)   Acute systolic heart failure (HCC)    sodium chloride    Intravenous Once   acetaminophen   1,000 mg Oral Q6H   Or   acetaminophen  (TYLENOL ) oral liquid 160 mg/5 mL  1,000 mg Oral Q6H   [START ON 03/01/2023] amiodarone   200 mg Oral Daily   amiodarone   400 mg Oral BID   azithromycin   500 mg Oral Daily   chlorhexidine   15 mL Mouth/Throat Once   Or   mouth rinse  15 mL Mouth Rinse Once   Chlorhexidine  Gluconate Cloth  6 each Topical Q0600   feeding supplement  237 mL Oral BID BM   gabapentin   300 mg Oral QHS   Followed by   NOREEN ON 02/26/2023] gabapentin   300 mg Oral BID   Gerhardt's butt cream   Topical TID   ketorolac   15 mg Intravenous Q6H   lidocaine   5 mL Intradermal Once   melatonin  5 mg Oral QHS   methocarbamol  (ROBAXIN ) injection  500 mg Intravenous Q8H   Or   methocarbamol   500 mg Oral Q8H    metoprolol  tartrate  12.5 mg Oral BID   mupirocin  ointment   Nasal BID   nicotine   21 mg Transdermal Daily   oxyCODONE   10 mg Oral Q6H   pantoprazole   40 mg Oral Daily   sodium chloride  flush  10 mL Intrapleural Q8H   sodium chloride  flush  10-40 mL Intracatheter Q12H   spironolactone   12.5 mg Oral Daily    SUBJECTIVE:  Afebrile overnight with no acute events. Having post-surgical pain and multiple bowel movements.   No Known Allergies   Review of Systems: Review of Systems  Constitutional:  Negative for chills, fever and weight loss.  Respiratory:  Negative for cough, shortness of breath and wheezing.   Cardiovascular:  Positive for chest pain. Negative for leg swelling.  Gastrointestinal:  Positive for diarrhea. Negative for abdominal pain, constipation, nausea and vomiting.  Skin:  Negative for rash.      OBJECTIVE: Vitals:   02/24/23 0330 02/24/23 0700 02/24/23 0750 02/24/23 0936  BP:  110/63  110/63  Pulse:  78 87 90  Resp:  16 (!) 23   Temp:   97.7 F (36.5 C)   TempSrc:   Oral   SpO2:  98% (!) 89%   Weight: 72.7 kg     Height:       Body mass index is  21.74 kg/m.  Physical Exam Constitutional:      General: He is not in acute distress.    Appearance: He is well-developed.  Cardiovascular:     Rate and Rhythm: Normal rate and regular rhythm.     Heart sounds: Normal heart sounds.     Comments: Chest tube to suction and patent.  Pulmonary:     Effort: Pulmonary effort is normal.     Breath sounds: Normal breath sounds.  Skin:    General: Skin is warm and dry.  Neurological:     Mental Status: He is alert.     Lab Results Lab Results  Component Value Date   WBC 7.6 02/24/2023   HGB 9.4 (L) 02/24/2023   HCT 29.6 (L) 02/24/2023   MCV 94.3 02/24/2023   PLT 322 02/24/2023    Lab Results  Component Value Date   CREATININE 0.61 02/24/2023   BUN 21 02/24/2023   NA 136 02/24/2023   K 4.5 02/24/2023   CL 102 02/24/2023   CO2 27 02/24/2023     Lab Results  Component Value Date   ALT 26 02/09/2023   AST 44 (H) 02/09/2023   ALKPHOS 117 02/09/2023   BILITOT 1.2 02/09/2023     Microbiology: Recent Results (from the past 240 hours)  Body fluid culture w Gram Stain     Status: None   Collection Time: 02/17/23  9:04 AM   Specimen: Pleura; Body Fluid  Result Value Ref Range Status   Specimen Description PLEURAL  Final   Special Requests body fluid  Final   Gram Stain   Final    FEW WBC PRESENT, PREDOMINANTLY PMN NO ORGANISMS SEEN    Culture   Final    NO GROWTH 3 DAYS Performed at [facility], [address], [place], [place] [address]    Report Status 02/20/2023 FINAL  Final  Respiratory (~20 pathogens) panel by PCR     Status: None   Collection Time: 02/20/23  4:42 PM   Specimen: Nasopharyngeal Swab; Respiratory  Result Value Ref Range Status   Adenovirus NOT DETECTED NOT DETECTED Final   Coronavirus 229E NOT DETECTED NOT DETECTED Final    Comment: (NOTE) The Coronavirus on the Respiratory Panel, DOES NOT test for the novel  Coronavirus (2019 nCoV)    Coronavirus HKU1 NOT DETECTED NOT DETECTED Final   Coronavirus NL63 NOT DETECTED NOT DETECTED Final   Coronavirus OC43 NOT DETECTED NOT DETECTED Final   Metapneumovirus NOT DETECTED NOT DETECTED Final   Rhinovirus / Enterovirus NOT DETECTED NOT DETECTED Final   Influenza A NOT DETECTED NOT DETECTED Final   Influenza B NOT DETECTED NOT DETECTED Final   Parainfluenza Virus 1 NOT DETECTED NOT DETECTED Final   Parainfluenza Virus 2 NOT DETECTED NOT DETECTED Final   Parainfluenza Virus 3 NOT DETECTED NOT DETECTED Final   Parainfluenza Virus 4 NOT DETECTED NOT DETECTED Final   Respiratory Syncytial Virus NOT DETECTED NOT DETECTED Final   Bordetella pertussis NOT DETECTED NOT DETECTED Final   Bordetella Parapertussis NOT DETECTED NOT DETECTED Final   Chlamydophila pneumoniae NOT DETECTED NOT DETECTED Final   Mycoplasma pneumoniae NOT DETECTED NOT DETECTED Final     Comment: Performed at [facility], [address], [place], [place] [address]  Culture, blood (Routine X 2) w Reflex to ID Panel     Status: None (Preliminary result)   Collection Time: 02/21/23  8:08 AM   Specimen: BLOOD LEFT HAND  Result Value Ref  Range Status   Specimen Description BLOOD LEFT HAND  Final   Special Requests   Final    BOTTLES DRAWN AEROBIC AND ANAEROBIC Blood Culture results may not be optimal due to an inadequate volume of blood received in culture bottles   Culture   Final    NO GROWTH 3 DAYS Performed at [facility], [address], [place], [place] [address]    Report Status PENDING  Incomplete  Culture, blood (Routine X 2) w Reflex to ID Panel     Status: None (Preliminary result)   Collection Time: 02/21/23  8:27 AM   Specimen: BLOOD RIGHT HAND  Result Value Ref Range Status   Specimen Description BLOOD RIGHT HAND  Final   Special Requests   Final    BOTTLES DRAWN AEROBIC AND ANAEROBIC Blood Culture results may not be optimal due to an inadequate volume of blood received in culture bottles   Culture   Final    NO GROWTH 3 DAYS Performed at [facility], [address], [place], [place] [address]    Report Status PENDING  Incomplete    I have personally spent 24 minutes involved in face-to-face and non-face-to-face activities for this patient on the day of the visit. Professional time spent includes the following activities: Preparing to see the patient (review of tests), Obtaining and/or reviewing separately obtained history (admission/discharge record), Performing a medically appropriate examination and/or evaluation , Ordering medications/tests/procedures, referring and communicating with other health care professionals, Documenting clinical information in the EMR, Independently interpreting results (not separately reported), Communicating results to the patient/family/caregiver, Counseling and educating the patient/family/caregiver and  Care coordination (not separately reported).    Greg Anwyn Kriegel, NP [facility]  02/24/2023  10:45 AM

## 2023-02-24 NOTE — Progress Notes (Signed)
 PROGRESS NOTE  Mathew Wyatt FMW:[id number] DOB: 1960-01-22 DOA: 02/08/2023 PCP: Patient, No Pcp Per   LOS: 16 days   Brief narrative:  Mathew Wyatt is a 63 y.o. male with past medical history of COPD and a smoker who had initially presented to [facility] hospital on 02/07/2023 with shortness of breath dyspnea on exertion with productive cough.  He was noted to be tachypneic with significant leukocytosis and elevated lactate.  Patient was started on IV antibiotic.  CT scan of the chest however was concerning for empyema versus abscess so he was transferred to [facility] on 02/08/2023 for further evaluation.  Patient however went into A-fib with RVR and hypotension and was admitted to ICU service.   1/21: TCTS defer VATS. ICU for afib rvr hypotension, started on amiodarone . CT placed with immediate 2L purulent output. Initial tube lytics with additional 300cc output. ECHO neg for vegetation, EF 45-50% 1/22: repeat CT chest 1/23 new chest tube placed by IR, cultures growing Campylobacter and Peptostreptococcus 1/26 dec'd pleural output but has persistent loculated fluid collection  1/27 AF w/ RVR. Treated w/ BB. Seen by ID; vanc stopped, switched to ceftriaxone , flagyl  and azith for campylobacter. CT chest obtained. The anterior fluid collection has improved. The lateral effusion vol is less but loculated air maintains. Now has Right layering effusion, and new patchy areas of GG changes. Some mild dec in left GG changes. Repeated round 4 pleural lytics (administered in right lateral tube)  1/28: lateral CT 120 net neg  1/30 no significant change after 7 doses of pleural lytic therapy.  -1/30, seen by Dr. Shyrl, patient refused VATS -1/31: Changed his mind, now agrees to surgery, TCTS notified -2/1: PCCM restarted Heparin  gtt -2/3, hemoglobin down to 6.0, heparin  held, transfusing, T CTS eval pending -2/4, reevaluated by Dr. Shyrl, plan for VATS -2/5 underwent VATS and decortication of left  empyema, Dr. Shyrl  Subjective: -Feels poorly, some cough and pain at the chest tube site  Assessment/Plan:  Sepsis secondary to left empyema, pneumonia Acute hypoxic respiratory failure -Status post chest tube placement x2.  Second chest tube placement by IR 02/10/2023.  -Was on ceftriaxone , Flagyl  and azithromycin , followed by Unasyn  per ID then changed to cefepime /metronidazole  per PCCM 2/1> now back on Unasyn /azithromycin  per ID -Orthopantogram with dental caries, unfortunately no inpatient dental coverage available now, would need outpatient follow-up -Pleural fluid cultures initially grew Campylobacter and Peptostreptococcus  -Repeat cultures with Streptococcus constellatus, pansensitive -CT surgery, pulmonary and ID following.    -Has completed 7 doses of fibrinolytic therapy without considerable improvement, T CTS consulted, seen by Dr. Shyrl 1/30 patient refused VATS/decortication,  -1/31 changed his mind, willing to have surgery -Finally underwent VATS/decortication 2/5 -Continue current antibiotics, follow-up OR culture, chest tube to suction per TCTS  New onset atrial fibrillation with RVR.   -Was on amiodarone  drip, then changed to p.o., back on amiodarone  gtt. again, continue metoprolol , RVR likely driven by empyema, has converted to sinus rhythm, changed to oral amiodarone  -we were holding heparin  with empyema and ongoing fibrinolytic therapy -Now stable, okay to resume anticoagulation per T CTS, restart IV heparin  today, switch to apixaban  in few days if stable  Acute on chronic anemia -Hemoglobin trended down to 3 days ago, transfused -Hemoglobin now stable, restarting anticoagulation today for A-fib  Acute systolic congestive heart failure with moderately reduced ejection fraction.  2D echocardiogram 45 to 50% with global hypokinesis. 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  Continue nebulizers.   insomnia anxiety.  On muscle relaxant Atarax  Ambien  and pain medication -Add Xanax  as needed   DVT prophylaxis: IV heparin  disposition: Home pending resolution of empyema    Code Status:    Code Status: Full Code  Family Communication: None at bedside  Consultants: CT surgery PCCM Interventional radiology Cardiology  Procedures: Left-sided chest tube placement x 2. Fibrinolytic treatment x 3  2/5, VATS and decortication  Anti-infectives (From admission, onward)    Start     Dose/Rate Route Frequency Ordered Stop   02/24/23 1600  Ampicillin -Sulbactam (UNASYN ) 3 g in sodium chloride  0.9 % 100 mL IVPB        3 g 200 mL/hr over 30 Minutes Intravenous Every 6 hours 02/24/23 0914     02/20/23 1615  metroNIDAZOLE  (FLAGYL ) tablet 500 mg  Status:  Discontinued        500 mg Oral Every 8 hours 02/20/23 1516 02/24/23 0914   02/20/23 1615  ceFEPIme  (MAXIPIME ) 2 g in sodium chloride  0.9 % 100 mL IVPB  Status:  Discontinued        2 g 200 mL/hr over 30 Minutes Intravenous Every 8 hours 02/20/23 1519 02/24/23 0914   02/17/23 2000  Ampicillin -Sulbactam (UNASYN ) 3 g in sodium chloride  0.9 % 100 mL IVPB  Status:  Discontinued        3 g 200 mL/hr over 30 Minutes Intravenous Every 6 hours 02/17/23 0917 02/20/23 1516   02/14/23 2200  metroNIDAZOLE  (FLAGYL ) tablet 500 mg  Status:  Discontinued        500 mg Oral Every 12 hours 02/14/23 1412 02/17/23 0917   02/14/23 2000  cefTRIAXone  (ROCEPHIN ) 2 g in sodium chloride  0.9 % 100 mL IVPB  Status:  Discontinued        2 g 200 mL/hr over 30 Minutes Intravenous Every 24 hours 02/14/23 1410 02/17/23 0917   02/14/23 1500  azithromycin  (ZITHROMAX ) tablet 500 mg        500 mg Oral Daily 02/14/23 1410     02/12/23 0200  vancomycin  (VANCOREADY) IVPB 1500 mg/300 mL  Status:  Discontinued        1,500 mg 150 mL/hr over 120 Minutes Intravenous Every 12 hours 02/11/23 1301 02/14/23 1410    02/11/23 1430  vancomycin  (VANCOCIN ) 750 mg in sodium chloride  0.9 % 250 mL IVPB  Status:  Discontinued        750 mg 265 mL/hr over 60 Minutes Intravenous  Once 02/11/23 1309 02/11/23 1354   02/11/23 1430  vancomycin  (VANCOCIN ) 750 mg in sodium chloride  0.9 % 250 mL IVPB        750 mg 250 mL/hr over 60 Minutes Intravenous  Once 02/11/23 1354 02/11/23 1543   02/11/23 1400  vancomycin  (VANCOREADY) IVPB 750 mg/150 mL  Status:  Discontinued        750 mg 150 mL/hr over 60 Minutes Intravenous  Once 02/11/23 1301 02/11/23 1309   02/10/23 2130  metroNIDAZOLE  (FLAGYL ) IVPB 500 mg  Status:  Discontinued        500 mg 100 mL/hr over 60 Minutes Intravenous Every 12 hours 02/10/23 1434 02/14/23 1410   02/08/23 1730  metroNIDAZOLE  (FLAGYL ) IVPB 500 mg  Status:  Discontinued        500 mg 100 mL/hr over 60 Minutes Intravenous Every 8 hours 02/08/23 1637 02/10/23 1434   02/08/23 0400  vancomycin  (VANCOREADY) IVPB 750 mg/150 mL  Status:  Discontinued  750 mg 150 mL/hr over 60 Minutes Intravenous Every 12 hours 02/08/23 0324 02/08/23 0328   02/08/23 0400  ceFEPIme  (MAXIPIME ) 2 g in sodium chloride  0.9 % 100 mL IVPB  Status:  Discontinued        2 g 200 mL/hr over 30 Minutes Intravenous Every 8 hours 02/08/23 0324 02/14/23 1410   02/08/23 0400  vancomycin  (VANCOCIN ) 750 mg in sodium chloride  0.9 % 250 mL IVPB  Status:  Discontinued        750 mg 250 mL/hr over 60 Minutes Intravenous Every 12 hours 02/08/23 0328 02/11/23 1258       Objective: Vitals:   02/24/23 0936 02/24/23 1106  BP: 110/63 102/66  Pulse: 90 79  Resp:  19  Temp:  (!) 97.5 F (36.4 C)  SpO2:      Intake/Output Summary (Last 24 hours) at 02/24/2023 1218 Last data filed at 02/24/2023 1215 Gross per 24 hour  Intake 2741.64 ml  Output 1050 ml  Net 1691.64 ml   Filed Weights   02/22/23 0413 02/23/23 0704 02/24/23 0330  Weight: 75.6 kg 67.1 kg 72.7 kg   Body mass index is 21.74 kg/m.   Physical Exam:  Chronically ill  male laying in bed, AAOx3 HEENT: No JVD CVS: S1-S2, irregular rhythm Lungs: Few basilar rhonchi, chest tube noted Abdomen: Soft, nontender, bowel sounds present Extremities: No edema  Data Review: I have personally reviewed the following laboratory data and studies,  CBC: Recent Labs  Lab 02/21/23 0440 02/21/23 1535 02/22/23 0711 02/23/23 0622 02/24/23 0330  WBC 6.5 7.6 7.1 5.7 7.6  HGB 6.0* 9.2* 9.8* 10.8* 9.4*  HCT 19.4* 28.4* 29.7* 33.5* 29.6*  MCV 96.0 91.3 90.8 92.5 94.3  PLT 241 303 325 340 322   Basic Metabolic Panel: Recent Labs  Lab 02/19/23 0440 02/20/23 0320 02/21/23 0440 02/22/23 0711 02/23/23 0622 02/24/23 0330  NA 134* 132* 136 133* 134* 136  K 4.0 4.3 3.1* 4.2 4.0 4.5  CL 102 100 104 96* 97* 102  CO2 24 26 22 27 28 27   GLUCOSE 194* 187* 112* 97 96 261*  BUN 7* 11 11 9 13 21   CREATININE 0.59* 0.73 0.50* 0.55* 0.53* 0.61  CALCIUM 8.1* 7.8* 6.7* 8.6* 8.9 8.7*  MG 1.7  --   --   --   --   --    Liver Function Tests: No results for input(s): AST, ALT, ALKPHOS, BILITOT, PROT, ALBUMIN  in the last 168 hours.  No results for input(s): LIPASE, AMYLASE in the last 168 hours.  No results for input(s): AMMONIA in the last 168 hours. Cardiac Enzymes: No results for input(s): CKTOTAL, CKMB, CKMBINDEX, TROPONINI in the last 168 hours. BNP (last 3 results) No results for input(s): BNP in the last 8760 hours.  ProBNP (last 3 results) No results for input(s): PROBNP in the last 8760 hours.  CBG: Recent Labs  Lab 02/20/23 2352  GLUCAP 132*   Recent Results (from the past 240 hours)  Body fluid culture w Gram Stain     Status: None   Collection Time: 02/17/23  9:04 AM   Specimen: Pleura; Body Fluid  Result Value Ref Range Status   Specimen Description PLEURAL  Final   Special Requests body fluid  Final   Gram Stain   Final    FEW WBC PRESENT, PREDOMINANTLY PMN NO ORGANISMS SEEN    Culture   Final    NO GROWTH 3  DAYS Performed at [facility], [address], [place], [place] [address]    Report Status 02/20/2023 FINAL  Final  Respiratory (~20 pathogens) panel by PCR     Status: None   Collection Time: 02/20/23  4:42 PM   Specimen: Nasopharyngeal Swab; Respiratory  Result Value Ref Range Status   Adenovirus NOT DETECTED NOT DETECTED Final   Coronavirus 229E NOT DETECTED NOT DETECTED Final    Comment: (NOTE) The Coronavirus on the Respiratory Panel, DOES NOT test for the novel  Coronavirus (2019 nCoV)    Coronavirus HKU1 NOT DETECTED NOT DETECTED Final   Coronavirus NL63 NOT DETECTED NOT DETECTED Final   Coronavirus OC43 NOT DETECTED NOT DETECTED Final   Metapneumovirus NOT DETECTED NOT DETECTED Final   Rhinovirus / Enterovirus NOT DETECTED NOT DETECTED Final   Influenza A NOT DETECTED NOT DETECTED Final   Influenza B NOT DETECTED NOT DETECTED Final   Parainfluenza Virus 1 NOT DETECTED NOT DETECTED Final   Parainfluenza Virus 2 NOT DETECTED NOT DETECTED Final   Parainfluenza Virus 3 NOT DETECTED NOT DETECTED Final   Parainfluenza Virus 4 NOT DETECTED NOT DETECTED Final   Respiratory Syncytial Virus NOT DETECTED NOT DETECTED Final   Bordetella pertussis NOT DETECTED NOT DETECTED Final   Bordetella Parapertussis NOT DETECTED NOT DETECTED Final   Chlamydophila pneumoniae NOT DETECTED NOT DETECTED Final   Mycoplasma pneumoniae NOT DETECTED NOT DETECTED Final    Comment: Performed at [facility], [address] [place], [place] [address]  Culture, blood (Routine X 2) w Reflex to ID Panel     Status: None (Preliminary result)   Collection Time: 02/21/23  8:08 AM   Specimen: BLOOD LEFT HAND  Result Value Ref Range Status   Specimen Description BLOOD LEFT HAND  Final   Special Requests   Final    BOTTLES DRAWN AEROBIC AND ANAEROBIC Blood Culture results may not be optimal due to an inadequate volume of blood received in culture bottles   Culture   Final    NO GROWTH 3  DAYS Performed at [facility], [address], [place], [place] [address]    Report Status PENDING  Incomplete  Culture, blood (Routine X 2) w Reflex to ID Panel     Status: None (Preliminary result)   Collection Time: 02/21/23  8:27 AM   Specimen: BLOOD RIGHT HAND  Result Value Ref Range Status   Specimen Description BLOOD RIGHT HAND  Final   Special Requests   Final    BOTTLES DRAWN AEROBIC AND ANAEROBIC Blood Culture results may not be optimal due to an inadequate volume of blood received in culture bottles   Culture   Final    NO GROWTH 3 DAYS Performed at [facility], [address] [place], [place] [address]    Report Status PENDING  Incomplete     Studies: DG Chest Port 1 View Result Date: 02/24/2023 CLINICAL DATA:  Pneumothorax follow-up. EXAM: PORTABLE CHEST 1 VIEW COMPARISON:  Chest x-ray from yesterday. FINDINGS: Unchanged left upper extremity PICC line and left-sided chest tube. The heart size and mediastinal contours are within normal limits. Stable volume loss in the left hemithorax with similar degree of pleural thickening without definite pneumothorax. Similar degree of consolidation in the peripheral mid and basilar left lung. Similar interstitial opacities in the right upper lobe. No acute osseous abnormality. IMPRESSION: 1. Unchanged left-sided chest tube without definite pneumothorax. 2. Similar bilateral airspace disease, worse on the left. Electronically Signed   By: Elsie ONEIDA Jud CHRISTELLA.D.  On: 02/24/2023 09:59   DG Chest Port 1 View Result Date: 02/23/2023 CLINICAL DATA:  Follow-up left pneumothorax EXAM: PORTABLE CHEST 1 VIEW COMPARISON:  02/21/2023 FINDINGS: Previously seen left pigtail thoracostomy tube has been replaced with a traditional thoracostomy tube. There is much less pleural density present on the left subsequently. Small amount of pleural density persists along with parenchymal density in the mid and lower lung. Hazy density in the right lung is  unchanged. No visible pleural air. Left arm PICC tip again in the SVC above the right atrium. IMPRESSION: Replacement of the left pigtail thoracostomy tube with a traditional thoracostomy tube. Much less pleural density on the left subsequently. Small amount of pleural density persists along with parenchymal density in the mid and lower lung. Electronically Signed   By: Oneil Officer M.D.   On: 02/23/2023 14:55       Sigurd Pac, MD  [facility] 02/24/2023  If 7PM-7AM, please contact night-coverage

## 2023-02-25 ENCOUNTER — Inpatient Hospital Stay (HOSPITAL_COMMUNITY): Payer: Medicaid Other

## 2023-02-25 DIAGNOSIS — J449 Chronic obstructive pulmonary disease, unspecified: Secondary | ICD-10-CM

## 2023-02-25 DIAGNOSIS — N179 Acute kidney failure, unspecified: Secondary | ICD-10-CM

## 2023-02-25 DIAGNOSIS — I5033 Acute on chronic diastolic (congestive) heart failure: Secondary | ICD-10-CM

## 2023-02-25 DIAGNOSIS — L89312 Pressure ulcer of right buttock, stage 2: Secondary | ICD-10-CM

## 2023-02-25 LAB — CBC
HCT: 24.4 % — ABNORMAL LOW (ref 39.0–52.0)
HCT: 28.8 % — ABNORMAL LOW (ref 39.0–52.0)
Hemoglobin: 7.5 g/dL — ABNORMAL LOW (ref 13.0–17.0)
Hemoglobin: 9 g/dL — ABNORMAL LOW (ref 13.0–17.0)
MCH: 29.4 pg (ref 26.0–34.0)
MCH: 29.6 pg (ref 26.0–34.0)
MCHC: 30.7 g/dL (ref 30.0–36.0)
MCHC: 31.3 g/dL (ref 30.0–36.0)
MCV: 95.7 fL (ref 80.0–100.0)
MCV: 94.7 fL (ref 80.0–100.0)
Platelets: 256 10*3/uL (ref 150–400)
Platelets: 337 10*3/uL (ref 150–400)
RBC: 2.55 MIL/uL — ABNORMAL LOW (ref 4.22–5.81)
RBC: 3.04 MIL/uL — ABNORMAL LOW (ref 4.22–5.81)
RDW: 15.9 % — ABNORMAL HIGH (ref 11.5–15.5)
RDW: 15.9 % — ABNORMAL HIGH (ref 11.5–15.5)
WBC: 6.2 10*3/uL (ref 4.0–10.5)
WBC: 7.8 10*3/uL (ref 4.0–10.5)
nRBC: 0 % (ref 0.0–0.2)
nRBC: 0 % (ref 0.0–0.2)

## 2023-02-25 LAB — TYPE AND SCREEN
ABO/RH(D): A POS
Antibody Screen: NEGATIVE
Unit division: 0
Unit division: 0
Unit division: 0

## 2023-02-25 LAB — COMPREHENSIVE METABOLIC PANEL
ALT: 9 U/L (ref 0–44)
AST: 14 U/L — ABNORMAL LOW (ref 15–41)
Albumin: 1.6 g/dL — ABNORMAL LOW (ref 3.5–5.0)
Alkaline Phosphatase: 42 U/L (ref 38–126)
Anion gap: 13 (ref 5–15)
BUN: 18 mg/dL (ref 8–23)
CO2: 26 mmol/L (ref 22–32)
Calcium: 7.2 mg/dL — ABNORMAL LOW (ref 8.9–10.3)
Chloride: 109 mmol/L (ref 98–111)
Creatinine, Ser: 1.3 mg/dL — ABNORMAL HIGH (ref 0.61–1.24)
GFR, Estimated: >60 mL/min (ref >60)
Glucose, Bld: 72 mg/dL (ref 70–99)
Potassium: 3.4 mmol/L — ABNORMAL LOW (ref 3.5–5.1)
Sodium: 148 mmol/L — ABNORMAL HIGH (ref 135–145)
Total Bilirubin: 0.2 mg/dL (ref 0.0–1.2)
Total Protein: 5.2 g/dL — ABNORMAL LOW (ref 6.5–8.1)

## 2023-02-25 LAB — BPAM RBC
Blood Product Expiration Date: 202502242359
Blood Product Expiration Date: 202502282359
Blood Product Expiration Date: 202502282359
ISSUE DATE / TIME: 202502030822
ISSUE DATE / TIME: 202502031046
Unit Type and Rh: 6200
Unit Type and Rh: 6200
Unit Type and Rh: 6200

## 2023-02-25 LAB — HEPARIN LEVEL (UNFRACTIONATED): Heparin Unfractionated: 0.34 [IU]/mL (ref 0.30–0.70)

## 2023-02-25 MED ORDER — AMOXICILLIN-POT CLAVULANATE 875-125 MG PO TABS
1.0000 | ORAL_TABLET | Freq: Two times a day (BID) | ORAL | Status: DC
  Administered 2023-02-25 – 2023-03-07 (×21): 1 via ORAL
  Filled 2023-02-25 (×21): qty 1

## 2023-02-25 MED ORDER — POTASSIUM CHLORIDE CRYS ER 20 MEQ PO TBCR
30.0000 meq | EXTENDED_RELEASE_TABLET | Freq: Once | ORAL | Status: AC
  Administered 2023-02-25: 30 meq via ORAL
  Filled 2023-02-25: qty 1

## 2023-02-25 NOTE — TOC Progression Note (Signed)
 Transition of Care [facility]) - Progression Note    Patient Details  Name: Jagger Demonte MRN: [id number] Date of Birth: Apr 28, 1960  Transition of Care [facility]) CM/SW Contact  Roxie KANDICE Stain, RN Phone Number: 02/25/2023, 2:20 PM  Clinical Narrative:    Spoke to patient at bedside regarding transition needs.  Patient declines home health and OP rehab but is agreeable to walker.  Sent LOG for walker and notified Mitch with [employer].  Patient is agreeable for TOC to make PCP apt and requested pcp in [place]. This RNCM sent request to CMA to make apt.  Patient has transportation.  TOC following.     Expected Discharge Plan: Home/Self Care Barriers to Discharge: Continued Medical Work up  Expected Discharge Plan and Services   Discharge Planning Services: CM Consult                                           Social Determinants of Health (SDOH) Interventions SDOH Screenings   Food Insecurity: No Food Insecurity (02/12/2023)  Housing: Low Risk  (02/12/2023)  Transportation Needs: No Transportation Needs (02/12/2023)  Utilities: Not At Risk (02/12/2023)  Tobacco Use: High Risk (02/08/2023)    Readmission Risk Interventions     No data to display

## 2023-02-25 NOTE — Assessment & Plan Note (Addendum)
Hypernatremia, hypokalemia.   Renal function has normalized with a serum cr at 0,89 with K at 4,5 and serum bicarbonate at 29  Na 135  No need for daily labs.

## 2023-02-25 NOTE — Assessment & Plan Note (Addendum)
Continue rate control with amiodarone and metoprolol.  Continue anticoagulation with heparin, will check with CT surgery if can transition to DOAC.

## 2023-02-25 NOTE — Progress Notes (Signed)
 [facility]  Date of Admission:  02/08/2023     Total days of antibiotics 18         ASSESSMENT:  Mathew Wyatt is POD #2 from VATS for empyema and is continuing to clinically improve. Chest tube with 100 cc output with continued management by CVTS. Discussed plan of care to change Unasyn  to Augmentin  and continue with current dose of Azithromycin . Diarrhea is slowly improving.  Final recommendations pending continued improvements. Remaining medical and supportive care per Internal Medicine.   PLAN:  Change Unasyn  to Augmentin . Continue Azithromycin .  Chest tube and post-operative management per CVTS. Remaining medical and supportive care per Internal Medicine.   Principal Problem:   Sepsis due to pneumonia [facility]) Active Problems:   Empyema (HCC)   Pressure injury of skin   Atrial fibrillation with rapid ventricular response (HCC)   Acute on chronic diastolic CHF (congestive heart failure) (HCC)   COPD (chronic obstructive pulmonary disease) (HCC)    sodium chloride    Intravenous Once   acetaminophen   1,000 mg Oral Q6H   Or   acetaminophen  (TYLENOL ) oral liquid 160 mg/5 mL  1,000 mg Oral Q6H   [START ON 03/01/2023] amiodarone   200 mg Oral Daily   amiodarone   400 mg Oral BID   amoxicillin -clavulanate  1 tablet Oral Q12H   azithromycin   500 mg Oral Daily   chlorhexidine   15 mL Mouth/Throat Once   Or   mouth rinse  15 mL Mouth Rinse Once   Chlorhexidine  Gluconate Cloth  6 each Topical Q0600   feeding supplement  237 mL Oral BID BM   gabapentin   300 mg Oral QHS   Followed by   NOREEN ON 02/26/2023] gabapentin   300 mg Oral BID   Gerhardt's butt cream   Topical TID   lidocaine   5 mL Intradermal Once   melatonin  5 mg Oral QHS   methocarbamol  (ROBAXIN ) injection  500 mg Intravenous Q8H   Or   methocarbamol   500 mg Oral Q8H   metoprolol  tartrate  12.5 mg Oral BID   mupirocin  ointment   Nasal BID   nicotine   21 mg Transdermal Daily   oxyCODONE   10  mg Oral Q6H   pantoprazole   40 mg Oral Daily   sodium chloride  flush  10 mL Intrapleural Q8H   sodium chloride  flush  10-40 mL Intracatheter Q12H   spironolactone   12.5 mg Oral Daily    SUBJECTIVE:  Afebrile overnight with no acute events. Feeling better today and has had 1 loose bowel movement so far. Asking about going home.   No Known Allergies   Review of Systems: Review of Systems  Constitutional:  Negative for chills, fever and weight loss.  Respiratory:  Negative for cough, shortness of breath and wheezing.   Cardiovascular:  Negative for chest pain and leg swelling.  Gastrointestinal:  Positive for diarrhea. Negative for abdominal pain, constipation, nausea and vomiting.  Skin:  Negative for rash.      OBJECTIVE: Vitals:   02/25/23 0553 02/25/23 0736 02/25/23 0931 02/25/23 1100  BP: 121/75 115/66 115/66 114/73  Pulse: 89 86 83   Resp: 18 17    Temp: 97.9 F (36.6 C) 97.7 F (36.5 C)  97.8 F (36.6 C)  TempSrc: Oral Oral  Oral  SpO2: 91% 96%    Weight:      Height:       Body mass index is 21.41 kg/m.  Physical Exam Constitutional:  General: He is not in acute distress.    Appearance: He is well-developed.     Comments: Lying in bed with head of bed elevated.   Cardiovascular:     Rate and Rhythm: Normal rate and regular rhythm.     Heart sounds: Normal heart sounds.  Pulmonary:     Effort: Pulmonary effort is normal.     Breath sounds: Normal breath sounds.     Comments: Chest tube in place.  Skin:    General: Skin is warm and dry.  Neurological:     Mental Status: He is alert.  Psychiatric:        Mood and Affect: Mood normal.     Lab Results Lab Results  Component Value Date   WBC 6.2 02/25/2023   HGB 7.5 (L) 02/25/2023   HCT 24.4 (L) 02/25/2023   MCV 95.7 02/25/2023   PLT 256 02/25/2023    Lab Results  Component Value Date   CREATININE 1.30 (H) 02/25/2023   BUN 18 02/25/2023   NA 148 (H) 02/25/2023   K 3.4 (L) 02/25/2023    CL 109 02/25/2023   CO2 26 02/25/2023    Lab Results  Component Value Date   ALT 9 02/25/2023   AST 14 (L) 02/25/2023   ALKPHOS 42 02/25/2023   BILITOT 0.2 02/25/2023     Microbiology: Recent Results (from the past 240 hours)  Body fluid culture w Gram Stain     Status: None   Collection Time: 02/17/23  9:04 AM   Specimen: Pleura; Body Fluid  Result Value Ref Range Status   Specimen Description PLEURAL  Final   Special Requests body fluid  Final   Gram Stain   Final    FEW WBC PRESENT, PREDOMINANTLY PMN NO ORGANISMS SEEN    Culture   Final    NO GROWTH 3 DAYS Performed at [facility], [address], [place], [place] [address]    Report Status 02/20/2023 FINAL  Final  Respiratory (~20 pathogens) panel by PCR     Status: None   Collection Time: 02/20/23  4:42 PM   Specimen: Nasopharyngeal Swab; Respiratory  Result Value Ref Range Status   Adenovirus NOT DETECTED NOT DETECTED Final   Coronavirus 229E NOT DETECTED NOT DETECTED Final    Comment: (NOTE) The Coronavirus on the Respiratory Panel, DOES NOT test for the novel  Coronavirus (2019 nCoV)    Coronavirus HKU1 NOT DETECTED NOT DETECTED Final   Coronavirus NL63 NOT DETECTED NOT DETECTED Final   Coronavirus OC43 NOT DETECTED NOT DETECTED Final   Metapneumovirus NOT DETECTED NOT DETECTED Final   Rhinovirus / Enterovirus NOT DETECTED NOT DETECTED Final   Influenza A NOT DETECTED NOT DETECTED Final   Influenza B NOT DETECTED NOT DETECTED Final   Parainfluenza Virus 1 NOT DETECTED NOT DETECTED Final   Parainfluenza Virus 2 NOT DETECTED NOT DETECTED Final   Parainfluenza Virus 3 NOT DETECTED NOT DETECTED Final   Parainfluenza Virus 4 NOT DETECTED NOT DETECTED Final   Respiratory Syncytial Virus NOT DETECTED NOT DETECTED Final   Bordetella pertussis NOT DETECTED NOT DETECTED Final   Bordetella Parapertussis NOT DETECTED NOT DETECTED Final   Chlamydophila pneumoniae NOT DETECTED NOT DETECTED Final   Mycoplasma  pneumoniae NOT DETECTED NOT DETECTED Final    Comment: Performed at [facility], [address] [place], [place] [address]  Culture, blood (Routine X 2) w Reflex to ID Panel     Status: None (Preliminary result)  Collection Time: 02/21/23  8:08 AM   Specimen: BLOOD LEFT HAND  Result Value Ref Range Status   Specimen Description BLOOD LEFT HAND  Final   Special Requests   Final    BOTTLES DRAWN AEROBIC AND ANAEROBIC Blood Culture results may not be optimal due to an inadequate volume of blood received in culture bottles   Culture   Final    NO GROWTH 4 DAYS Performed at [facility], [address] [place], [place] [address]    Report Status PENDING  Incomplete  Culture, blood (Routine X 2) w Reflex to ID Panel     Status: None (Preliminary result)   Collection Time: 02/21/23  8:27 AM   Specimen: BLOOD RIGHT HAND  Result Value Ref Range Status   Specimen Description BLOOD RIGHT HAND  Final   Special Requests   Final    BOTTLES DRAWN AEROBIC AND ANAEROBIC Blood Culture results may not be optimal due to an inadequate volume of blood received in culture bottles   Culture   Final    NO GROWTH 4 DAYS Performed at [facility], [address], [place], [place] [address]    Report Status PENDING  Incomplete    I have personally spent 25 minutes involved in face-to-face and non-face-to-face activities for this patient on the day of the visit. Professional time spent includes the following activities: Preparing to see the patient (review of tests), Obtaining and/or reviewing separately obtained history (admission/discharge record), Performing a medically appropriate examination and/or evaluation , Ordering medications/tests/procedures, referring and communicating with other health care professionals, Documenting clinical information in the EMR, Independently interpreting results (not separately reported), Communicating results to the patient/family/caregiver, Counseling and  educating the patient/family/caregiver and Care coordination (not separately reported).   Greg Bralynn Donado, NP [facility]  02/25/2023  11:14 AM

## 2023-02-25 NOTE — Progress Notes (Signed)
 Physical Therapy Treatment Patient Details Name: Mathew Wyatt MRN: [id number] DOB: August 14, 1960 Today's Date: 02/25/2023   History of Present Illness 63 yo male adm to [facility] 02/07/23 with SOB, DOE, productive cough with Lt empyema. 1/21 pt transferred to [facility] with placement of 2 chest tubes and went into Afib with RVR. 1/30 s/p 7 doses of pleural lytics. Pt s/p VATS and decortication on 02/23/23. PMH: insomnia, anxiety, CHF, smoker, COPD    PT Comments  Pt is slowly progressing. Improved endurance this session with O2 sats above 90% on Room air for 75 feet of gait with decreased need for UE support on RW. Pt continues to require some assist with lines for sit to stand and has difficulty rolling to the L side due to pain. Due to pt current functional status, home set up and available assistance at home recommending skilled physical therapy services 3x/week in order to address strength, balance and functional mobility to decrease risk for falls, injury and re-hospitalization.      If plan is discharge home, recommend the following: Help with stairs or ramp for entrance;Assistance with cooking/housework;Assist for transportation;A little help with walking and/or transfers     Equipment Recommendations  Rolling walker (2 wheels)       Precautions / Restrictions Precautions Precautions: Fall Precaution Comments: 1 chest tube Restrictions Weight Bearing Restrictions Per Provider Order: No     Mobility  Bed Mobility Overal bed mobility: Needs Assistance Bed Mobility: Rolling, Sidelying to Sit Rolling: Supervision     Sit to supine: Modified independent (Device/Increase time)        Transfers Overall transfer level: Needs assistance Equipment used: Rolling walker (2 wheels) Transfers: Sit to/from Stand Sit to Stand: Supervision           General transfer comment: supervision for safety and assist with lines.    Ambulation/Gait Ambulation/Gait assistance: Supervision Gait Distance  (Feet): 75 Feet Assistive device: Rolling walker (2 wheels) Gait Pattern/deviations: Step-through pattern, Decreased stride length Gait velocity: decreased Gait velocity interpretation: 1.31 - 2.62 ft/sec, indicative of limited community ambulator   General Gait Details: Slightly unsteady, Moderately reliant on RW      Balance Overall balance assessment: Mild deficits observed, not formally tested Sitting-balance support: Feet unsupported, No upper extremity supported, Single extremity supported Sitting balance-Leahy Scale: Fair Sitting balance - Comments: No overt LOB   Standing balance support: During functional activity, Bilateral upper extremity supported Standing balance-Leahy Scale: Fair Standing balance comment: UE support no overt LOB, slightly shaky     Cognition Arousal: Alert Behavior During Therapy: WFL for tasks assessed/performed Overall Cognitive Status: Within Functional Limits for tasks assessed     General Comments: motivated today           General Comments General comments (skin integrity, edema, etc.): Pt O2 sats remained in the 90's on room air.      Pertinent Vitals/Pain Pain Assessment Pain Assessment: 0-10 Pain Score: 8  Pain Location: chest tubes/flank Pain Descriptors / Indicators: Aching Pain Intervention(s): Monitored during session, Limited activity within patient's tolerance, Premedicated before session           PT Goals (current goals can now be found in the care plan section) Acute Rehab PT Goals Patient Stated Goal: get stronger and go home PT Goal Formulation: With patient Time For Goal Achievement: 03/02/23 Potential to Achieve Goals: Good Progress towards PT goals: Progressing toward goals    Frequency    Min 1X/week      PT Plan  Continue  with current POC        AM-PAC PT 6 Clicks Mobility   Outcome Measure  Help needed turning from your back to your side while in a flat bed without using bedrails?: A  Little Help needed moving from lying on your back to sitting on the side of a flat bed without using bedrails?: A Little Help needed moving to and from a bed to a chair (including a wheelchair)?: A Little Help needed standing up from a chair using your arms (e.g., wheelchair or bedside chair)?: A Little Help needed to walk in hospital room?: A Little Help needed climbing 3-5 steps with a railing? : A Little 6 Click Score: 18    End of Session   Activity Tolerance: Patient tolerated treatment well Patient left: in bed;with call bell/phone within reach Nurse Communication: Mobility status PT Visit Diagnosis: Unsteadiness on feet (R26.81);Muscle weakness (generalized) (M62.81);Difficulty in walking, not elsewhere classified (R26.2)     Time: 8752-8696 PT Time Calculation (min) (ACUTE ONLY): 16 min  Charges:    $Therapeutic Activity: 8-22 mins PT General Charges $$ ACUTE PT VISIT: 1 Visit                     Dorothyann Maier, DPT, CLT  Acute Rehabilitation Services Office: [phone] (Secure chat preferred)    Dorothyann VEAR Maier 02/25/2023, 4:49 PM

## 2023-02-25 NOTE — Assessment & Plan Note (Addendum)
 Echocardiogram with mild reduced LV systolic function, with EF 45 to 50%, with global hypokinesis, RV systolic function preserved, small pericardial effusion, no significant valvular disease.   Exacerbation has resolved.   Continue medical therapy with metoprolol  and spironolactone .

## 2023-02-25 NOTE — Assessment & Plan Note (Signed)
 Right buttock stage 1 ulcer present not clear if present admission.

## 2023-02-25 NOTE — Progress Notes (Addendum)
      [address]       [name] [address]             [phone]       2 Days Post-Op Procedure(s) (LRB): VIDEO ASSISTED THORACOSCOPY (VATS)/DECORTICATION (Left)  Subjective: Patient states I am ready to go home.  Objective: Vital signs in last 24 hours: Temp:  [97.5 F (36.4 C)-97.9 F (36.6 C)] 97.9 F (36.6 C) (02/07 0553) Pulse Rate:  [77-90] 89 (02/07 0553) Cardiac Rhythm: Normal sinus rhythm (02/07 0700) Resp:  [15-23] 18 (02/07 0553) BP: (94-121)/(63-75) 121/75 (02/07 0553) SpO2:  [89 %-98 %] 91 % (02/07 0553) Weight:  [71.6 kg] 71.6 kg (02/07 0550)    Intake/Output from previous day: 02/06 0701 - 02/07 0700 In: 2124.8 [P.O.:1200; I.V.:603.2; IV Piggyback:321.5] Out: 475 [Urine:375; Chest Tube:100]   Physical Exam:  Cardiovascular: RRR Pulmonary: Bilateral rhonchi  Abdomen: Soft, non tender, bowel sounds present. Extremities: SCDs in place Wounds: Dressing is clean and dry.   Chest Tube:To suction, air leak  Lab Results: CBC: Recent Labs    02/24/23 0330 02/25/23 0500  WBC 7.6 6.2  HGB 9.4* 7.5*  HCT 29.6* 24.4*  PLT 322 256   BMET:  Recent Labs    02/24/23 0330 02/25/23 0500  NA 136 148*  K 4.5 3.4*  CL 102 109  CO2 27 26  GLUCOSE 261* 72  BUN 21 18  CREATININE 0.61 1.30*  CALCIUM 8.7* 7.2*    PT/INR: No results for input(s): LABPROT, INR in the last 72 hours. ABG:  INR: Will add last result for INR, ABG once components are confirmed Will add last 4 CBG results once components are confirmed  Assessment/Plan:  1. CV - Previous a fib with RVT. SR this am. On Amiodarone  400 mg bid and Heparin  drip. 2.  Pulmonary - On room air. Chest tube with 100 cc last 24 hours. Chest tube is to suction with air leak. CXR this am appears stable (no pneumothorax, left pleural thickening/consolidation. As discussed with Dr. Shyrl, will place chest tube to water  seal. Encourage incentive spirometer and flutter valve. 3.  ID-On Azithromycin  and Unasyn  for PNA/empyema. Blood culture show no growth for 4 days 4. Creatinine slightly increased to 1.3. Toradol  finished and will not re order 5. Supplement potassium  Mathew M ZimmermanPA-C 02/25/2023,7:17 AM  Agree with above Waterseal to day  Mathew Wyatt

## 2023-02-25 NOTE — Assessment & Plan Note (Signed)
 No signs of exacerbation.  Continue bronchodilator therapy.  Airway clearing techniques incentive spirometer and flutter valve,

## 2023-02-25 NOTE — Progress Notes (Signed)
 Mobility Specialist Progress Note;   02/25/23 1515  Mobility  Activity Ambulated with assistance in room  Level of Assistance Contact guard assist, steadying assist  Assistive Device Front wheel walker  Distance Ambulated (ft) 20 ft  Activity Response Tolerated fair  Mobility Referral Yes  Mobility visit 1 Mobility  Mobility Specialist Start Time (ACUTE ONLY) 1515  Mobility Specialist Stop Time (ACUTE ONLY) 1536  Mobility Specialist Time Calculation (min) (ACUTE ONLY) 21 min   Pt feeling more under the weather this session, however agreeable to mobility in room. On RA upon arrival. Required MinG assistance for bed mobility and during ambulation. Ambulated on RA, SPO2 90%> throughout. C/o pain in chest insertion site. Pt returned back to bed with all needs met.   Lauraine Erm Mobility Specialist Please contact via SecureChat or [employer] [phone]

## 2023-02-25 NOTE — Progress Notes (Signed)
 PHARMACY - ANTICOAGULATION CONSULT NOTE  Pharmacy Consult for IV heparin  Indication: atrial fibrillation  No Known Allergies  Patient Measurements: Height: 6' (182.9 cm) Weight: 71.6 kg (157 lb 13.6 oz) IBW/kg (Calculated) : 77.6 Heparin  Dosing Weight: ~ 80 kg  Vital Signs: Temp: 97.7 F (36.5 C) (02/07 0736) Temp Source: Oral (02/07 0736) BP: 115/66 (02/07 0931) Pulse Rate: 83 (02/07 0931)  Labs: Recent Labs    02/23/23 0622 02/24/23 0330 02/24/23 2030 02/25/23 0500  HGB 10.8* 9.4*  --  7.5*  HCT 33.5* 29.6*  --  24.4*  PLT 340 322  --  256  HEPARINUNFRC  --   --  0.31 0.34  CREATININE 0.53* 0.61  --  1.30*    Estimated Creatinine Clearance: 59.7 mL/min (A) (by C-G formula based on SCr of 1.3 mg/dL (H)).  Assessment: 63 yo male with new onset afib, initially converted to NSR following placement of chest tube however patient had converted back to afib. He was on IV heparin  and this was discontinued with hg 6.0 on 2/2 then it was held for VATs on 2/4  Okay to resume IV heparin  per Dr. Shyrl.  -heparin  level= 0.34 and at goal on 1750 units/hr -hg 7.5 with trend down  Goal of Therapy:  Heparin  level 0.3-0.5 Monitor platelets by anticoagulation protocol: Yes   Plan:  -Continue heparin  1750 units/hr -recheck CBC later today -Daily heparin  level and CBC  Prentice Poisson, PharmD Clinical Pharmacist **Pharmacist phone directory can now be found on [email or web address] ([email or web address]).  Listed under [facility].

## 2023-02-25 NOTE — Progress Notes (Addendum)
 Progress Note   Patient: Mathew Wyatt FMW:[id number] DOB: 04/19/1960 DOA: 02/08/2023     17 DOS: the patient was seen and examined on 02/25/2023   Brief hospital course: Mathew Wyatt is a 63 y.o. male with past medical history of COPD and a smoker who had initially presented to [facility] on 02/07/2023 with shortness of breath dyspnea on exertion with productive cough.  He was noted to be tachypneic with significant leukocytosis and elevated lactate.  Patient was started on IV antibiotic.  CT scan of the chest however was concerning for empyema versus abscess so he was transferred to [facility] on 02/08/2023 for further evaluation.  Patient however went into A-fib with RVR and hypotension and was admitted to ICU service.   1/21: TCTS defer VATS. ICU for afib rvr hypotension, started on amiodarone . CT placed with immediate 2L purulent output. Initial tube lytics with additional 300cc output. ECHO neg for vegetation, EF 45-50% 1/22: repeat CT chest 1/23 new chest tube placed by IR, cultures growing Campylobacter and Peptostreptococcus 1/26 dec'd pleural output but has persistent loculated fluid collection  1/27 AF w/ RVR. Treated w/ BB. Seen by ID; vanc stopped, switched to ceftriaxone , flagyl  and azith for campylobacter. CT chest obtained. The anterior fluid collection has improved. The lateral effusion vol is less but loculated air maintains. Now has Right layering effusion, and new patchy areas of GG changes. Some mild dec in left GG changes. Repeated round 4 pleural lytics (administered in right lateral tube)  1/28: lateral CT 120 net neg  1/30 no significant change after 7 doses of pleural lytic therapy.  -1/30, seen by Dr. Shyrl, patient refused VATS -1/31: Changed his mind, now agrees to surgery, TCTS notified -2/1: PCCM restarted Heparin  gtt -2/3, hemoglobin down to 6.0, heparin  held, transfusing, T CTS eval pending -2/4, reevaluated by Dr. Shyrl, plan for VATS -2/5 underwent  VATS and decortication of left empyema, Dr. Shyrl  Assessment and Plan: * Empyema (HCC) Hydropneumothorax.  Left lower lobe pneumonia.  Patient had chest tube placement x2  Second chest tube placed on 02/10/23  Pleural fluid culture positive for Campylobacter and Peptostreptococcus.   Sp fibronolytic therapy for 7 days, with no major improvement.  CT surgery was consulted. 02/05 underwent VATS with decortication.  02/07 chest tube drainage 100 cc over last 24 hrs.   Antibiotic change to Augmentin  and continue azithromycin .  Change tube from suction to water  seal.   Continue incentive spirometer and out of bed.   Patient had pneumonia with sepsis during this hospitalization, now sepsis has resolved.   Atrial fibrillation with rapid ventricular response (HCC) Continue rate control with amiodarone  and metoprolol .  Amiodarone  has been changed to po after a IV load.  Continue anticoagulation with heparin , eventually transition to direct oral anticoagulants.   Acute on chronic diastolic CHF (congestive heart failure) (HCC) Echocardiogram with mild reduced LV systolic function, with EF 45 to 50%, with global hypokinesis, RV systolic function preserved, small pericardial effusion, no significant valvular disease.   Continue medical therapy with metoprolol  and spironolactone .   Pressure injury of skin Right buttock stage 1 ulcer present not clear if present admission.   COPD (chronic obstructive pulmonary disease) (HCC) No signs of exacerbation.  Continue bronchodilator therapy.  Airway clearing techniques incentive spirometer and flutter valve,   AKI (acute kidney injury) (HCC) Hypernatremia, hypokalemia.   Renal function today with serum cr at 1,3 with K at 3,4 and serum bicarbonate at 26  Na 148  Subjective: Patient is feeling better, he has bee out of the bed, he continue to have pleuritic chest pain on the left side.   Physical Exam: Vitals:   02/25/23  0553 02/25/23 0736 02/25/23 0931 02/25/23 1100  BP: 121/75 115/66 115/66 114/73  Pulse: 89 86 83   Resp: 18 17    Temp: 97.9 F (36.6 C) 97.7 F (36.5 C)  97.8 F (36.6 C)  TempSrc: Oral Oral  Oral  SpO2: 91% 96%  96%  Weight:      Height:       Neurology awake and alert ENT with mild pallor Cardiovascular with S1 and S2 present and regular with no gallops, rubs or murmurs Respiratory with no rhonchi or wheezing, mild rales at bases Chest tune on the left side. Abdomen with no distention  No lower extremity edema  Data Reviewed:    Family Communication: no family at the bedside   Disposition: Status is: Inpatient Remains inpatient appropriate because: chest tube management.   Planned Discharge Destination: Home     Author: Elidia Toribio Furnace, MD 02/25/2023 12:51 PM  For on call review [email or web address].

## 2023-02-25 NOTE — Plan of Care (Signed)

## 2023-02-26 ENCOUNTER — Inpatient Hospital Stay (HOSPITAL_COMMUNITY): Payer: Medicaid Other

## 2023-02-26 DIAGNOSIS — N179 Acute kidney failure, unspecified: Secondary | ICD-10-CM

## 2023-02-26 LAB — CBC
HCT: 31.8 % — ABNORMAL LOW (ref 39.0–52.0)
Hemoglobin: 10.1 g/dL — ABNORMAL LOW (ref 13.0–17.0)
MCH: 29.8 pg (ref 26.0–34.0)
MCHC: 31.8 g/dL (ref 30.0–36.0)
MCV: 93.8 fL (ref 80.0–100.0)
Platelets: 380 10*3/uL (ref 150–400)
RBC: 3.39 MIL/uL — ABNORMAL LOW (ref 4.22–5.81)
RDW: 15.7 % — ABNORMAL HIGH (ref 11.5–15.5)
WBC: 8.3 10*3/uL (ref 4.0–10.5)
nRBC: 0 % (ref 0.0–0.2)

## 2023-02-26 LAB — BASIC METABOLIC PANEL
Anion gap: 14 (ref 5–15)
BUN: 8 mg/dL (ref 8–23)
CO2: 29 mmol/L (ref 22–32)
Calcium: 9.9 mg/dL (ref 8.9–10.3)
Chloride: 94 mmol/L — ABNORMAL LOW (ref 98–111)
Creatinine, Ser: 0.48 mg/dL — ABNORMAL LOW (ref 0.61–1.24)
GFR, Estimated: >60 mL/min (ref >60)
Glucose, Bld: 116 mg/dL — ABNORMAL HIGH (ref 70–99)
Potassium: 4.1 mmol/L (ref 3.5–5.1)
Sodium: 137 mmol/L (ref 135–145)

## 2023-02-26 LAB — HEPARIN LEVEL (UNFRACTIONATED)
Heparin Unfractionated: 0.46 [IU]/mL (ref 0.30–0.70)
Heparin Unfractionated: 0.5 [IU]/mL (ref 0.30–0.70)

## 2023-02-26 LAB — CULTURE, BLOOD (ROUTINE X 2)
Culture: NO GROWTH
Culture: NO GROWTH

## 2023-02-26 MED ORDER — KETOROLAC TROMETHAMINE 15 MG/ML IJ SOLN
15.0000 mg | Freq: Four times a day (QID) | INTRAMUSCULAR | Status: AC
Start: 2023-02-26 — End: 2023-02-27
  Administered 2023-02-26 – 2023-02-27 (×5): 15 mg via INTRAVENOUS
  Filled 2023-02-26 (×5): qty 1

## 2023-02-26 NOTE — Progress Notes (Signed)
 PHARMACY - ANTICOAGULATION CONSULT NOTE  Pharmacy Consult for IV heparin  Indication: atrial fibrillation  No Known Allergies  Patient Measurements: Height: 6' (182.9 cm) Weight: 69.8 kg (153 lb 14.1 oz) IBW/kg (Calculated) : 77.6 Heparin  Dosing Weight: ~ 80 kg  Vital Signs: Temp: 97.9 F (36.6 C) (02/08 1038) Temp Source: Oral (02/08 1038) BP: 100/66 (02/08 1038) Pulse Rate: 88 (02/08 0535)  Labs: Recent Labs    02/24/23 0330 02/24/23 2030 02/25/23 0500 02/25/23 1245 02/26/23 0435 02/26/23 1510  HGB 9.4*  --  7.5* 9.0* 10.1*  --   HCT 29.6*  --  24.4* 28.8* 31.8*  --   PLT 322  --  256 337 380  --   HEPARINUNFRC  --    < > 0.34  --  0.46 0.50  CREATININE 0.61  --  1.30*  --  0.48*  --    < > = values in this interval not displayed.    Estimated Creatinine Clearance: 94.5 mL/min (A) (by C-G formula based on SCr of 0.48 mg/dL (L)).  Assessment: 63 yo male with new onset afib, initially converted to NSR following placement of chest tube however patient had converted back to afib. He was on IV heparin  and this was discontinued with hg 6.0 on 2/2 then it was held for VATs on 2/4. Okay to resume IV heparin  per Dr. Shyrl.   Heparin  level is therapeutic (0.5) on infusion at 1600 units/hr - remains at higher end of goal and actually up a bit after rate decrease.  Goal of Therapy:  Heparin  level 0.3-0.5 units/ml Monitor platelets by anticoagulation protocol: Yes   Plan:  Decrease heparin  infusion slightly to 1550 units/hr F/u daily heparin  level  Vito Ralph, PharmD, BCPS Please see amion for complete clinical pharmacist phone list 02/26/2023  4:16 PM

## 2023-02-26 NOTE — Progress Notes (Addendum)
      [address]       [place] [address]             [phone]       3 Days Post-Op Procedure(s) (LRB): VIDEO ASSISTED THORACOSCOPY (VATS)/DECORTICATION (Left)  Subjective: Patient states his pain is a 10 this morning. He is not sleeping much because of pain.  Objective: Vital signs in last 24 hours: Temp:  [97.8 F (36.6 C)-98.7 F (37.1 C)] 98.7 F (37.1 C) (02/08 0721) Pulse Rate:  [76-90] 88 (02/08 0535) Cardiac Rhythm: Normal sinus rhythm (02/08 0700) Resp:  [15-20] 19 (02/08 0535) BP: (102-120)/(61-73) 106/66 (02/08 0721) SpO2:  [90 %-96 %] 90 % (02/08 0721) Weight:  [69.8 kg] 69.8 kg (02/08 0500)    Intake/Output from previous day: 02/07 0701 - 02/08 0700 In: 1194.4 [P.O.:760; I.V.:434.4] Out: 2015 [Urine:1925; Chest Tube:90]   Physical Exam:  Cardiovascular: RRR Pulmonary: Diminished on left lateral/base Abdomen: Soft, non tender, bowel sounds present. Extremities: SCDs in place Wounds: Dressing is clean and dry.   Chest Tube:To water  seal, ++ air leak  Lab Results: CBC: Recent Labs    02/25/23 1245 02/26/23 0435  WBC 7.8 8.3  HGB 9.0* 10.1*  HCT 28.8* 31.8*  PLT 337 380   BMET:  Recent Labs    02/25/23 0500 02/26/23 0435  NA 148* 137  K 3.4* 4.1  CL 109 94*  CO2 26 29  GLUCOSE 72 116*  BUN 18 8  CREATININE 1.30* 0.48*  CALCIUM 7.2* 9.9    PT/INR: No results for input(s): LABPROT, INR in the last 72 hours. ABG:  INR: Will add last result for INR, ABG once components are confirmed Will add last 4 CBG results once components are confirmed  Assessment/Plan:  1. CV - Previous a fib with RVT. SR this am. On Lopressor  12.5 mg bid, Amiodarone  400 mg bid and Heparin  drip. 2.  Pulmonary - On room air. Chest tube with 90 cc last 24 hours. Chest tube is to water  seal with ++ air leak. CXR this am appears stable (no pneumothorax, slowly improving left pleural thickening/consolidation.  Encourage incentive spirometer  and flutter valve. 3. ID-On Azithromycin  and Augmentin  for PNA/empyema. Blood culture show no growth for 4 days 4. Regarding pain control, continue Dilaudid  IV Q 4 hours PRN, will give a few doses of IV Toradol  now that creatinine WNL. Continue with Oxy 10 mg every 6 hours PRN, scheduled Robaxin  500 tid, scheduled Gabapentin  bid to start today.  Donielle M ZimmermanPA-C 02/26/2023,8:15 AM   Chart reviewed, patient examined, agree with above.  CXR is stable. He has air leak with cough. Continue to water  seal.

## 2023-02-26 NOTE — Progress Notes (Signed)
 PHARMACY - ANTICOAGULATION CONSULT NOTE  Pharmacy Consult for IV heparin  Indication: atrial fibrillation  No Known Allergies  Patient Measurements: Height: 6' (182.9 cm) Weight: 69.8 kg (153 lb 14.1 oz) IBW/kg (Calculated) : 77.6 Heparin  Dosing Weight: ~ 80 kg  Vital Signs: Temp: 97.9 F (36.6 C) (02/08 1038) Temp Source: Oral (02/08 1038) BP: 100/66 (02/08 1038) Pulse Rate: 88 (02/08 0535)  Labs: Recent Labs    02/24/23 0330 02/24/23 2030 02/25/23 0500 02/25/23 1245 02/26/23 0435 02/26/23 1510  HGB 9.4*  --  7.5* 9.0* 10.1*  --   HCT 29.6*  --  24.4* 28.8* 31.8*  --   PLT 322  --  256 337 380  --   HEPARINUNFRC  --    < > 0.34  --  0.46 0.50  CREATININE 0.61  --  1.30*  --  0.48*  --    < > = values in this interval not displayed.    Estimated Creatinine Clearance: 94.5 mL/min (A) (by C-G formula based on SCr of 0.48 mg/dL (L)).  Assessment: 63 yo male with new onset afib, initially converted to NSR following placement of chest tube however patient had converted back to afib. He was on IV heparin  and this was discontinued with hg 6.0 on 2/2 then it was held for VATs on 2/4. Okay to resume IV heparin  per Dr. Shyrl.   Heparin  level is therapeutic (0.5) on infusion at 1600 units/hr. No bleeding noted.  Goal of Therapy:  Heparin  level 0.3-0.5 units/ml Monitor platelets by anticoagulation protocol: Yes   Plan:  Continue heparin  infusion at 1600 units/hr F/u daily heparin  level  Vito Ralph, PharmD, BCPS Please see amion for complete clinical pharmacist phone list 02/26/2023  4:15 PM

## 2023-02-26 NOTE — Progress Notes (Signed)
 PHARMACY - ANTICOAGULATION CONSULT NOTE  Pharmacy Consult for IV heparin  Indication: atrial fibrillation  No Known Allergies  Patient Measurements: Height: 6' (182.9 cm) Weight: 69.8 kg (153 lb 14.1 oz) IBW/kg (Calculated) : 77.6 Heparin  Dosing Weight: ~ 80 kg  Vital Signs: Temp: 98.4 F (36.9 C) (02/08 0455) Temp Source: Oral (02/08 0455) BP: 103/61 (02/08 0455) Pulse Rate: 88 (02/08 0535)  Labs: Recent Labs    02/24/23 0330 02/24/23 2030 02/25/23 0500 02/25/23 1245 02/26/23 0435  HGB 9.4*  --  7.5* 9.0* 10.1*  HCT 29.6*  --  24.4* 28.8* 31.8*  PLT 322  --  256 337 380  HEPARINUNFRC  --  0.31 0.34  --  0.46  CREATININE 0.61  --  1.30*  --  0.48*    Estimated Creatinine Clearance: 94.5 mL/min (A) (by C-G formula based on SCr of 0.48 mg/dL (L)).  Assessment: 63 yo male with new onset afib, initially converted to NSR following placement of chest tube however patient had converted back to afib. He was on IV heparin  and this was discontinued with hg 6.0 on 2/2 then it was held for VATs on 2/4. Okay to resume IV heparin  per Dr. Shyrl.   Heparin  level is on higher end of goal at 0.46 on UFH IV infusion 1750 units/hour. CBC is stable this AM. No signs of bleeding or issues with line per RN  Goal of Therapy:  Heparin  level 0.3-0.5 Monitor platelets by anticoagulation protocol: Yes   Plan:  -Reduced heparin  infusion to 1600 units/hr -Check 6-hour HL -Daily heparin  level and CBC  Signe Dawn, PharmD PGY2 Cardiology Pharmacy Resident  **Pharmacist phone directory can now be found on [email or web address] ([email or web address]).  Listed under [facility].

## 2023-02-26 NOTE — Progress Notes (Signed)
 Progress Note   Patient: Mathew Wyatt FMW:[id number] DOB: 1960/06/03 DOA: 02/08/2023     18 DOS: the patient was seen and examined on 02/26/2023   Brief hospital course: Mathew Wyatt is a 63 y.o. male with past medical history of COPD and a smoker who had initially presented to [facility] hospital on 02/07/2023 with shortness of breath dyspnea on exertion with productive cough.  He was noted to be tachypneic with significant leukocytosis and elevated lactate.  Patient was started on IV antibiotic.  CT scan of the chest however was concerning for empyema versus abscess so he was transferred to [facility] on 02/08/2023 for further evaluation.  Patient however went into A-fib with RVR and hypotension and was admitted to ICU service.   1/21: TCTS defer VATS. ICU for afib rvr hypotension, started on amiodarone . CT placed with immediate 2L purulent output. Initial tube lytics with additional 300cc output. ECHO neg for vegetation, EF 45-50% 1/22: repeat CT chest 1/23 new chest tube placed by IR, cultures growing Campylobacter and Peptostreptococcus 1/26 dec'd pleural output but has persistent loculated fluid collection  1/27 AF w/ RVR. Treated w/ BB. Seen by ID; vanc stopped, switched to ceftriaxone , flagyl  and azith for campylobacter. CT chest obtained. The anterior fluid collection has improved. The lateral effusion vol is less but loculated air maintains. Now has Right layering effusion, and new patchy areas of GG changes. Some mild dec in left GG changes. Repeated round 4 pleural lytics (administered in right lateral tube)  1/28: lateral CT 120 net neg  1/30 no significant change after 7 doses of pleural lytic therapy.  -1/30, seen by Dr. Shyrl, patient refused VATS -1/31: Changed his mind, now agrees to surgery, TCTS notified -2/1: PCCM restarted Heparin  gtt -2/3, hemoglobin down to 6.0, heparin  held, transfusing, T CTS eval pending -2/4, reevaluated by Dr. Shyrl, plan for VATS -2/5 underwent  VATS and decortication of left empyema, Dr. Shyrl 02/08 continue to have air leak with cough, chest tube to water  seal.  Antibiotics now oral.   Assessment and Plan: * Empyema (HCC) Hydropneumothorax.  Left lower lobe pneumonia.  Patient had chest tube placement x2  Second chest tube placed on 02/10/23  Pleural fluid culture positive for Campylobacter and Peptostreptococcus.   Sp fibronolytic therapy for 7 days, with no major improvement.  CT surgery was consulted. 02/05 underwent VATS with decortication.  02/08 chest tube drainage 90 cc over last 24 hrs.  Chest tube to water  seal, continue to have air leak with coughing.   Antibiotic therapy with Augmentin  and azithromycin .  Change tube from suction to water  seal.   Continue incentive spirometer and out of bed.   Patient had pneumonia with sepsis during this hospitalization, now sepsis has resolved.   Atrial fibrillation with rapid ventricular response (HCC) Continue rate control with amiodarone  and metoprolol .  Amiodarone  has been changed to po after a IV load.  Continue anticoagulation with heparin , eventually transition to direct oral anticoagulants.   Acute on chronic diastolic CHF (congestive heart failure) (HCC) Echocardiogram with mild reduced LV systolic function, with EF 45 to 50%, with global hypokinesis, RV systolic function preserved, small pericardial effusion, no significant valvular disease.   Continue medical therapy with metoprolol  and spironolactone .   Pressure injury of skin Right buttock stage 1 ulcer present not clear if present admission.   COPD (chronic obstructive pulmonary disease) (HCC) No signs of exacerbation.  Continue bronchodilator therapy.  Airway clearing techniques incentive spirometer and flutter valve,   AKI (acute  kidney injury) (HCC) Hypernatremia, hypokalemia.   Improved renal function with serum cr at 0,48 with K at 4,1 and serum bicarbonate at 29  Na 137           Subjective: Patient is feeling better, he has been out of the bed, chest pain is controlled, no dyspnea or edema   Physical Exam: Vitals:   02/26/23 0500 02/26/23 0535 02/26/23 0721 02/26/23 1038  BP:   106/66 100/66  Pulse:  88    Resp:  19    Temp:   98.7 F (37.1 C) 97.9 F (36.6 C)  TempSrc:   Oral Oral  SpO2:  91% 90%   Weight: 69.8 kg     Height:       Neurology awake and alert ENT with no pallor  Cardiovascular with S1 and S2 present and regular with no gallops, rubs or murmurs Respiratory with decreased breath sounds at the left base with no wheezing, rales or rhonchi Abdomen with no distention  No lower extremity edema  Data Reviewed:    Family Communication: no family at the bedside   Disposition: Status is: Inpatient Remains inpatient appropriate because: chest tube care   Planned Discharge Destination: rehab     Author: Elidia Toribio Furnace, MD 02/26/2023 3:49 PM  For on call review [email or web address].

## 2023-02-27 ENCOUNTER — Inpatient Hospital Stay (HOSPITAL_COMMUNITY): Payer: Medicaid Other

## 2023-02-27 LAB — CBC
HCT: 31.3 % — ABNORMAL LOW (ref 39.0–52.0)
Hemoglobin: 9.8 g/dL — ABNORMAL LOW (ref 13.0–17.0)
MCH: 29.2 pg (ref 26.0–34.0)
MCHC: 31.3 g/dL (ref 30.0–36.0)
MCV: 93.2 fL (ref 80.0–100.0)
Platelets: 365 10*3/uL (ref 150–400)
RBC: 3.36 MIL/uL — ABNORMAL LOW (ref 4.22–5.81)
RDW: 15.8 % — ABNORMAL HIGH (ref 11.5–15.5)
WBC: 6 10*3/uL (ref 4.0–10.5)
nRBC: 0 % (ref 0.0–0.2)

## 2023-02-27 LAB — HEPARIN LEVEL (UNFRACTIONATED)
Heparin Unfractionated: 0.36 [IU]/mL (ref 0.30–0.70)
Heparin Unfractionated: 0.39 [IU]/mL (ref 0.30–0.70)

## 2023-02-27 NOTE — Progress Notes (Signed)
 Pt continued to ask for dilaudid . However, pt BP too soft to safely give. This am pt O2 dropped to 86 and was placed on 5L [place] with sats at 92, BP was 101/78- pt very sleepy and still asking for dilaudid . This RN did not feel this would be a safe choice for him considering the above.

## 2023-02-27 NOTE — Progress Notes (Signed)
 Progress Note   Patient: Mathew Wyatt FMW:[id number] DOB: 08/06/1960 DOA: 02/08/2023     19 DOS: the patient was seen and examined on 02/27/2023   Brief hospital course: Cornelis Kluver is a 63 y.o. male with past medical history of COPD and a smoker who had initially presented to [facility] hospital on 02/07/2023 with shortness of breath dyspnea on exertion with productive cough.  He was noted to be tachypneic with significant leukocytosis and elevated lactate.  Patient was started on IV antibiotic.  CT scan of the chest however was concerning for empyema versus abscess so he was transferred to [facility] on 02/08/2023 for further evaluation.  Patient however went into A-fib with RVR and hypotension and was admitted to ICU service.   1/21: TCTS defer VATS. ICU for afib rvr hypotension, started on amiodarone . CT placed with immediate 2L purulent output. Initial tube lytics with additional 300cc output. ECHO neg for vegetation, EF 45-50% 1/22: repeat CT chest 1/23 new chest tube placed by IR, cultures growing Campylobacter and Peptostreptococcus 1/26 dec'd pleural output but has persistent loculated fluid collection  1/27 AF w/ RVR. Treated w/ BB. Seen by ID; vanc stopped, switched to ceftriaxone , flagyl  and azith for campylobacter. CT chest obtained. The anterior fluid collection has improved. The lateral effusion vol is less but loculated air maintains. Now has Right layering effusion, and new patchy areas of GG changes. Some mild dec in left GG changes. Repeated round 4 pleural lytics (administered in right lateral tube)  1/28: lateral CT 120 net neg  1/30 no significant change after 7 doses of pleural lytic therapy.  -1/30, seen by Dr. Shyrl, patient refused VATS -1/31: Changed his mind, now agrees to surgery, TCTS notified -2/1: PCCM restarted Heparin  gtt -2/3, hemoglobin down to 6.0, heparin  held, transfusing, T CTS eval pending -2/4, reevaluated by Dr. Shyrl, plan for VATS -2/5 underwent  VATS and decortication of left empyema, Dr. Shyrl 02/08 continue to have air leak with cough, chest tube to water  seal.  Antibiotics now oral.  02/09 continue air leak with cough.   Assessment and Plan: * Empyema (HCC) Hydropneumothorax.  Left lower lobe pneumonia.  Patient had chest tube placement x2  Second chest tube placed on 02/10/23  Pleural fluid culture positive for Campylobacter and Peptostreptococcus.   Sp fibronolytic therapy for 7 days, with no major improvement.  CT surgery was consulted. 02/05 underwent VATS with decortication.  02/08 chest tube drainage 90 cc over last 24 hrs.  Chest tube to water  seal, continue to have air leak with coughing.   Antibiotic therapy with Augmentin  and azithromycin .  Chest tube to water  seal.   Continue incentive spirometer and out of bed.   Patient had pneumonia with sepsis during this hospitalization, now sepsis has resolved.   Atrial fibrillation with rapid ventricular response (HCC) Continue rate control with amiodarone  and metoprolol .  Amiodarone  has been changed to po after a IV load.  Continue anticoagulation with heparin , eventually transition to direct oral anticoagulants.   Acute on chronic diastolic CHF (congestive heart failure) (HCC) Echocardiogram with mild reduced LV systolic function, with EF 45 to 50%, with global hypokinesis, RV systolic function preserved, small pericardial effusion, no significant valvular disease.   Continue medical therapy with metoprolol  and spironolactone .   Pressure injury of skin Right buttock stage 1 ulcer present not clear if present admission.   COPD (chronic obstructive pulmonary disease) (HCC) No signs of exacerbation.  Continue bronchodilator therapy.  Airway clearing techniques incentive spirometer and flutter  valve,   AKI (acute kidney injury) (HCC) Hypernatremia, hypokalemia.   Follow up renal function in am.         Subjective: Patient today has been more  somnolent, positive pain at the site of the chest tube. No nausea or vomiting and tolerating po well.   Physical Exam: Vitals:   02/27/23 0741 02/27/23 0746 02/27/23 0909 02/27/23 1204  BP:  91/61 113/71 109/76  Pulse:  73  81  Resp:  13  17  Temp:    (!) 97.2 F (36.2 C)  TempSrc:    Axillary  SpO2: 93% 92%  91%  Weight:      Height:       Neurology awake and alert ENT with mild pallor Cardiovascular with S1 and S2 present and regular with no gallops, rubs or murmurs Respiratory with no rales or wheezing, decreased inspiratory effort with decreased breath sounds at the left base. Abdomen with no distention  No lower extremity edema  Data Reviewed:    Family Communication: no family at the bedside   Disposition: Status is: Inpatient Remains inpatient appropriate because: chest tube inpatient management   Planned Discharge Destination: Home      Author: Elidia Toribio Furnace, MD 02/27/2023 3:01 PM  For on call review [email or web address].

## 2023-02-27 NOTE — Progress Notes (Signed)
 Mobility Specialist Progress Note:   02/27/23 1600  Mobility  Activity Ambulated with assistance in room  Level of Assistance Minimal assist, patient does 75% or more (+2)  Assistive Device Front wheel walker  Distance Ambulated (ft) 25 ft  Activity Response Tolerated well  Mobility Referral Yes  Mobility visit 1 Mobility  Mobility Specialist Start Time (ACUTE ONLY) 1600  Mobility Specialist Stop Time (ACUTE ONLY) 1618  Mobility Specialist Time Calculation (min) (ACUTE ONLY) 18 min   Pt received in BR, eager for mobility session. Pt became significantly dizzy while still in the BR. BP 90s/50s, able to stand from toilet with minA+2 assist to stand and ambulate for safety. Pt back in bed with all needs met, BP 105/69. Pt left with all needs met, alarm on.   Therisa Rana Mobility Specialist Please contact via SecureChat or  Rehab office at [phone]

## 2023-02-27 NOTE — Progress Notes (Addendum)
 PHARMACY - ANTICOAGULATION CONSULT NOTE  Pharmacy Consult for IV heparin  Indication: atrial fibrillation  No Known Allergies  Patient Measurements: Height: 6' (182.9 cm) Weight: 69.8 kg (153 lb 14.1 oz) IBW/kg (Calculated) : 77.6 Heparin  Dosing Weight: ~ 80 kg  Vital Signs: Temp: 97.9 F (36.6 C) (02/08 2332) Temp Source: Oral (02/08 2332) BP: 101/71 (02/09 0520) Pulse Rate: 70 (02/09 0520)  Labs: Recent Labs    02/25/23 0500 02/25/23 1245 02/26/23 0435 02/26/23 1510 02/27/23 0500  HGB 7.5* 9.0* 10.1*  --  9.8*  HCT 24.4* 28.8* 31.8*  --  31.3*  PLT 256 337 380  --  365  HEPARINUNFRC 0.34  --  0.46 0.50 0.36  CREATININE 1.30*  --  0.48*  --   --     Estimated Creatinine Clearance: 94.5 mL/min (A) (by C-G formula based on SCr of 0.48 mg/dL (L)).  Assessment: 63 yo male with new onset afib, initially converted to NSR following placement of chest tube however patient had converted back to afib. He was on IV heparin  and this was discontinued with hg 6.0 on 2/2 then it was held for VATs on 2/4. Okay to resume IV heparin  per Dr. Shyrl.   Heparin  level is therapeutic at 0.36 on infusion at 1550 units/hr. CBC stable. No signs of bleeding or issues with line per RN.  Goal of Therapy:  Heparin  level 0.3-0.5 units/ml Monitor platelets by anticoagulation protocol: Yes   Plan:  Continue heparin  infusion slightly to 1550 units/hr F/u daily heparin  level and CBC Monitor for signs of bleeding  Signe Dawn, PharmD PGY2 Cardiology Pharmacy Resident Please see [email or web address] for complete clinical pharmacist phone list 02/27/2023  6:30 AM  UPDATE: 02/27/23 1204: Heparin  level is therapeutic at 0.39 on UFH 1550 units/hour  Continue UFH infusion at 1550 units/hour  Signe Dawn, PharmD PGY2 Cardiology Pharmacy Resident

## 2023-02-27 NOTE — Progress Notes (Addendum)
      [address]       [name] [address]             [phone]       4 Days Post-Op Procedure(s) (LRB): VIDEO ASSISTED THORACOSCOPY (VATS)/DECORTICATION (Left)  Subjective: Patient states not given pain medication all night. Day nurse informed him he was sleeping and BP was labile so not given. She is about to check BP and give Dilaudid  if able  Objective: Vital signs in last 24 hours: Temp:  [97.8 F (36.6 C)-98.4 F (36.9 C)] 97.9 F (36.6 C) (02/08 2332) Pulse Rate:  [70-80] 73 (02/09 0746) Cardiac Rhythm: Normal sinus rhythm (02/09 0700) Resp:  [12-17] 13 (02/09 0746) BP: (91-108)/(57-73) 91/61 (02/09 0746) SpO2:  [88 %-94 %] 92 % (02/09 0746) Weight:  [69.8 kg] 69.8 kg (02/09 0527)    Intake/Output from previous day: 02/08 0701 - 02/09 0700 In: 240 [P.O.:240] Out: 770 [Urine:700; Chest Tube:70]   Physical Exam:  Cardiovascular: RRR Pulmonary: Diminished on left lateral/base Abdomen: Soft, non tender, bowel sounds present. Extremities: No lower extremity edema Wounds: Dressing is clean and dry.   Chest Tube:To water  seal, ++ air leak  Lab Results: CBC: Recent Labs    02/26/23 0435 02/27/23 0500  WBC 8.3 6.0  HGB 10.1* 9.8*  HCT 31.8* 31.3*  PLT 380 365   BMET:  Recent Labs    02/25/23 0500 02/26/23 0435  NA 148* 137  K 3.4* 4.1  CL 109 94*  CO2 26 29  GLUCOSE 72 116*  BUN 18 8  CREATININE 1.30* 0.48*  CALCIUM 7.2* 9.9    PT/INR: No results for input(s): LABPROT, INR in the last 72 hours. ABG:  INR: Will add last result for INR, ABG once components are confirmed Will add last 4 CBG results once components are confirmed  Assessment/Plan:  1. CV - Previous a fib with RVT. SR this am. On Lopressor  12.5 mg bid, Amiodarone  400 mg bid and Heparin  drip. Also, on Spironolactone  12.5 mg daily. Per medicine 2.  Pulmonary - On 2L via [place]. Chest tube with 70 cc last 24 hours. Chest tube is to water  seal with ++ air leak.  CXR this am appears stable (left basilar pneumothorax, slowly improving left pleural thickening/consolidation).  Check CXR in am. Encourage incentive spirometer and flutter valve. 3. ID-On Azithromycin  and Augmentin  for PNA/empyema. Blood culture show no growth for 5 days 4. Regarding pain control, continue Dilaudid  IV Q 4 hours PRN, will give a few doses of IV Toradol  now that creatinine WNL. Continue with Oxy 10 mg every 6 hours PRN, scheduled Robaxin  500 tid, scheduled Gabapentin  bid to start today.  Donielle M ZimmermanPA-C 02/27/2023,8:26 AM   Chart reviewed, patient examined, agree with above.  Still has air leak. CXR unchanged with lateral space/ptx that tube is in. Continue to water  seal.

## 2023-02-28 ENCOUNTER — Inpatient Hospital Stay (HOSPITAL_COMMUNITY): Payer: Medicaid Other

## 2023-02-28 LAB — CBC
HCT: 33.6 % — ABNORMAL LOW (ref 39.0–52.0)
Hemoglobin: 10.4 g/dL — ABNORMAL LOW (ref 13.0–17.0)
MCH: 29.3 pg (ref 26.0–34.0)
MCHC: 31 g/dL (ref 30.0–36.0)
MCV: 94.6 fL (ref 80.0–100.0)
Platelets: 478 10*3/uL — ABNORMAL HIGH (ref 150–400)
RBC: 3.55 MIL/uL — ABNORMAL LOW (ref 4.22–5.81)
RDW: 15.9 % — ABNORMAL HIGH (ref 11.5–15.5)
WBC: 7.7 10*3/uL (ref 4.0–10.5)
nRBC: 0 % (ref 0.0–0.2)

## 2023-02-28 LAB — HEPARIN LEVEL (UNFRACTIONATED)
Heparin Unfractionated: 0.17 [IU]/mL — ABNORMAL LOW (ref 0.30–0.70)
Heparin Unfractionated: 0.52 [IU]/mL (ref 0.30–0.70)

## 2023-02-28 MED ORDER — OXYCODONE HCL 5 MG PO TABS
10.0000 mg | ORAL_TABLET | ORAL | Status: DC | PRN
  Administered 2023-02-28 – 2023-03-07 (×20): 10 mg via ORAL
  Filled 2023-02-28 (×20): qty 2

## 2023-02-28 MED ORDER — HEPARIN BOLUS VIA INFUSION
1000.0000 [IU] | Freq: Once | INTRAVENOUS | Status: AC
  Administered 2023-02-28: 1000 [IU] via INTRAVENOUS
  Filled 2023-02-28: qty 1000

## 2023-02-28 NOTE — Progress Notes (Signed)
 PHARMACY - ANTICOAGULATION CONSULT NOTE  Pharmacy Consult for IV heparin  Indication: atrial fibrillation  No Known Allergies  Patient Measurements: Height: 6' (182.9 cm) Weight: 71.7 kg (158 lb 1.1 oz) IBW/kg (Calculated) : 77.6 Heparin  Dosing Weight: ~ 80 kg  Vital Signs: Temp: 97.8 F (36.6 C) (02/10 0725) Temp Source: Oral (02/10 0725) BP: 111/68 (02/10 0725) Pulse Rate: 85 (02/10 0725)  Labs: Recent Labs    02/26/23 0435 02/26/23 1510 02/27/23 0500 02/27/23 1100 02/28/23 0459  HGB 10.1*  --  9.8*  --  10.4*  HCT 31.8*  --  31.3*  --  33.6*  PLT 380  --  365  --  478*  HEPARINUNFRC 0.46   < > 0.36 0.39 0.17*  CREATININE 0.48*  --   --   --   --    < > = values in this interval not displayed.    Estimated Creatinine Clearance: 97.1 mL/min (A) (by C-G formula based on SCr of 0.48 mg/dL (L)).  Assessment: 63 yo male with new onset afib, initially converted to NSR following placement of chest tube however patient had converted back to afib. He was on IV heparin  and this was discontinued with hg 6.0 on 2/2 then it was held for VATs on 2/4. Okay to resume IV heparin  per Dr. Deloise Ferries.   Heparin  level is sub-therapeutic at 0.17 on infusion at 1550 units/hr. CBC stable. No infusion problems noted per RN. He has been at goal previously on the same rate.   Goal of Therapy:  Heparin  level 0.3-0.5 units/ml Monitor platelets by anticoagulation protocol: Yes   Plan:  -Heparin  bolus 1000 units x1 then increase to 1600 units/hr -heparin  level in 8 hrs  Baxter Limber, PharmD Clinical Pharmacist **Pharmacist phone directory can now be found on [email or web address] ([email or web address]).  Listed under [facility].

## 2023-02-28 NOTE — Progress Notes (Signed)
 Mobility Specialist Progress Note;   02/28/23 1430  Mobility  Activity Ambulated with assistance in hallway  Level of Assistance Standby assist, set-up cues, supervision of patient - no hands on  Assistive Device Front wheel walker  Distance Ambulated (ft) 150 ft  Activity Response Tolerated well  Mobility Referral Yes  Mobility visit 1 Mobility  Mobility Specialist Start Time (ACUTE ONLY) 1430  Mobility Specialist Stop Time (ACUTE ONLY) 1450  Mobility Specialist Time Calculation (min) (ACUTE ONLY) 20 min   Pt eager for mobility. On 1LO2 upon arrival. Required no physical assistance during ambulation, SV. Pt requested to attempt ambulating on RA, SPO2 desat to 86%, requiring 2LO2 to stay [facility]. No c/o during session. Pt returned back to sitting on EOB with all needs met, on 1LO2.   Janit Meline Mobility Specialist Please contact via SecureChat or [employer] [phone]

## 2023-02-28 NOTE — Progress Notes (Signed)
 PHARMACY - ANTICOAGULATION CONSULT NOTE  Pharmacy Consult for IV heparin  Indication: atrial fibrillation  No Known Allergies  Patient Measurements: Height: 6' (182.9 cm) Weight: 71.7 kg (158 lb 1.1 oz) IBW/kg (Calculated) : 77.6 Heparin  Dosing Weight: ~ 80 kg  Vital Signs: Temp: 97.6 F (36.4 C) (02/10 1604) Temp Source: Oral (02/10 1604) BP: 99/71 (02/10 1604) Pulse Rate: 86 (02/10 1604)  Labs: Recent Labs    02/26/23 0435 02/26/23 1510 02/27/23 0500 02/27/23 1100 02/28/23 0459 02/28/23 1730  HGB 10.1*  --  9.8*  --  10.4*  --   HCT 31.8*  --  31.3*  --  33.6*  --   PLT 380  --  365  --  478*  --   HEPARINUNFRC 0.46   < > 0.36 0.39 0.17* 0.52  CREATININE 0.48*  --   --   --   --   --    < > = values in this interval not displayed.    Estimated Creatinine Clearance: 97.1 mL/min (A) (by C-G formula based on SCr of 0.48 mg/dL (L)).  Assessment: 63 yo male with new onset afib, initially converted to NSR following placement of chest tube however patient had converted back to afib. He was on IV heparin  and this was discontinued with hg 6.0 on 2/2 then it was held for VATs on 2/4. Okay to resume IV heparin  per Dr. Deloise Ferries.   Heparin  level just barely above goal at 0.52, likely due to bolus this am. Will continue current rate and recheck with am labs.  Goal of Therapy:  Heparin  level 0.3-0.5 units/ml Monitor platelets by anticoagulation protocol: Yes   Plan:  -Continue heparin  1600 units/h -Daily HL and CBC  Levin Reamer, PharmD, BCPS, [facility] Clinical Pharmacist [phone] Please check AMION for all [facility] numbers 02/28/2023

## 2023-02-28 NOTE — Progress Notes (Signed)
      [address]       [place] [address]             [phone]       5 Days Post-Op Procedure(s) (LRB): VIDEO ASSISTED THORACOSCOPY (VATS)/DECORTICATION (Left)  Subjective: Patient frustrated that still with air leak and still in hospital.  Objective: Vital signs in last 24 hours: Temp:  [97.2 F (36.2 C)-98 F (36.7 C)] 97.7 F (36.5 C) (02/10 0417) Pulse Rate:  [73-92] 88 (02/10 0418) Cardiac Rhythm: Normal sinus rhythm (02/09 1900) Resp:  [13-32] 19 (02/10 0418) BP: (91-113)/(57-76) 106/70 (02/10 0417) SpO2:  [91 %-96 %] 94 % (02/10 0418) Weight:  [71.7 kg] 71.7 kg (02/10 0442)    Intake/Output from previous day: 02/09 0701 - 02/10 0700 In: 360 [P.O.:360] Out: 830 [Urine:800; Chest Tube:30]   Physical Exam:  Cardiovascular: RRR Pulmonary: Diminished on left lateral/base Abdomen: Soft, non tender, bowel sounds present. Extremities: No lower extremity edema Wounds: Dressing is clean and dry.   Chest Tube:To water  seal, + air leak  Lab Results: CBC: Recent Labs    02/27/23 0500 02/28/23 0459  WBC 6.0 7.7  HGB 9.8* 10.4*  HCT 31.3* 33.6*  PLT 365 478*   BMET:  Recent Labs    02/26/23 0435  NA 137  K 4.1  CL 94*  CO2 29  GLUCOSE 116*  BUN 8  CREATININE 0.48*  CALCIUM 9.9    PT/INR: No results for input(s): "LABPROT", "INR" in the last 72 hours. ABG:  INR: Will add last result for INR, ABG once components are confirmed Will add last 4 CBG results once components are confirmed  Assessment/Plan:  1. CV - Previous a fib with RVT. SR this am. On Lopressor  12.5 mg bid, Amiodarone  400 mg bid and Heparin  drip. Also, on Spironolactone  12.5 mg daily. Per medicine 2.  Pulmonary - On 2L via [place]. Chest tube with 30 cc last 24 hours. Chest tube is to water  seal with + air leak. CXR this am appears stable (left basilar pneumothorax, slowly improving left pleural thickening/consolidation).  Check CXR in am. Encourage incentive spirometer  and flutter valve. 3. ID-On Azithromycin  and Augmentin  for PNA/empyema. Blood culture show no growth for 5 days 4. Regarding pain control, continue Dilaudid  IV Q 4 hours PRN, will give a few doses of IV Toradol  now that creatinine "WNL". Continue with Oxy 10 mg every 6 hours PRN, scheduled Robaxin  500 tid, scheduled Gabapentin  bid to start today.  Delanee Xin M ZimmermanPA-C 02/28/2023,7:10 AM

## 2023-02-28 NOTE — Plan of Care (Signed)
  Problem: Fluid Volume: Goal: Hemodynamic stability will improve Outcome: Progressing   Problem: Respiratory: Goal: Ability to maintain adequate ventilation will improve Outcome: Progressing   Problem: Education: Goal: Knowledge of General Education information will improve Description: Including pain rating scale, medication(s)/side effects and non-pharmacologic comfort measures Outcome: Progressing   Problem: Health Behavior/Discharge Planning: Goal: Ability to manage health-related needs will improve Outcome: Progressing   Problem: Clinical Measurements: Goal: Ability to maintain clinical measurements within normal limits will improve Outcome: Progressing Goal: Will remain free from infection Outcome: Progressing Goal: Diagnostic test results will improve Outcome: Progressing   Problem: Activity: Goal: Risk for activity intolerance will decrease Outcome: Progressing

## 2023-02-28 NOTE — Progress Notes (Signed)
 [facility]  Date of Admission:  02/08/2023     Total days of antibiotics 21         ASSESSMENT:  Mr. Ence is POD #5 from VATS and chest tube placement with air leak in the setting of Campylobacter and Streptococcus constellatus empyema. Diarrhea is improved and tolerating antibiotics with no adverse side effects and will continue therapeutic drug monitoring for duration of treatment. Continue current dose of Azithromycin  and Augmentin . Post-operative wound care and chest tube management per CVTS with left basilar pneumothorax and improving left pleural consolidation. Reminded to follow up with dentistry following discharge. Remaining medical and supportive care per Internal Medicine.   PLAN:  Continue current dose of Azithromycin  and Amoxicillin -clavulanate.  Post-surgical and chest tube management per CVTS.  Therapeutic monitoring of renal function, hepatic function, and diarrhea Remaining medical and supportive care per Internal Medicine.   Principal Problem:   Empyema (HCC) Active Problems:   Pressure injury of skin   Atrial fibrillation with rapid ventricular response (HCC)   Acute on chronic diastolic CHF (congestive heart failure) (HCC)   COPD (chronic obstructive pulmonary disease) (HCC)   AKI (acute kidney injury) (HCC)    acetaminophen   1,000 mg Oral Q6H   Or   acetaminophen  (TYLENOL ) oral liquid 160 mg/5 mL  1,000 mg Oral Q6H   [START ON 03/01/2023] amiodarone   200 mg Oral Daily   amiodarone   400 mg Oral BID   amoxicillin -clavulanate  1 tablet Oral Q12H   azithromycin   500 mg Oral Daily   Chlorhexidine  Gluconate Cloth  6 each Topical Q0600   feeding supplement  237 mL Oral BID BM   gabapentin   300 mg Oral BID   Gerhardt's butt cream   Topical TID   lidocaine   5 mL Intradermal Once   melatonin  5 mg Oral QHS   methocarbamol  (ROBAXIN ) injection  500 mg Intravenous Q8H   Or   methocarbamol   500 mg Oral Q8H   metoprolol  tartrate  12.5  mg Oral BID   mupirocin  ointment   Nasal BID   nicotine   21 mg Transdermal Daily   pantoprazole   40 mg Oral Daily   sodium chloride  flush  10 mL Intrapleural Q8H   sodium chloride  flush  10-40 mL Intracatheter Q12H   spironolactone   12.5 mg Oral Daily    SUBJECTIVE:  Afebrile overnight with no acute events. Frustrated about air leak in chest tube. Overall feeling better. Diarrhea improved and just loose.   No Known Allergies   Review of Systems: Review of Systems  Constitutional:  Negative for chills, fever and weight loss.  Respiratory:  Negative for cough, shortness of breath and wheezing.   Cardiovascular:  Negative for chest pain and leg swelling.  Gastrointestinal:  Negative for abdominal pain, constipation, diarrhea, nausea and vomiting.  Skin:  Negative for rash.      OBJECTIVE: Vitals:   02/28/23 0418 02/28/23 0442 02/28/23 0725 02/28/23 1100  BP:   111/68 119/73  Pulse: 88  85 90  Resp: 19  16 20   Temp:   97.8 F (36.6 C) 97.7 F (36.5 C)  TempSrc:   Oral Oral  SpO2: 94%  93% 95%  Weight:  71.7 kg    Height:       Body mass index is 21.44 kg/m.  Physical Exam Constitutional:      General: He is not in acute distress.    Appearance: He is well-developed.  HENT:  Mouth/Throat:     Comments: Poor dentition.  Cardiovascular:     Rate and Rhythm: Normal rate and regular rhythm.     Heart sounds: Normal heart sounds.  Pulmonary:     Effort: Pulmonary effort is normal.     Breath sounds: Normal breath sounds.     Comments: Chest tube with air leak noted.  Skin:    General: Skin is warm and dry.  Neurological:     Mental Status: He is alert.  Psychiatric:        Mood and Affect: Mood normal.     Lab Results Lab Results  Component Value Date   WBC 7.7 02/28/2023   HGB 10.4 (L) 02/28/2023   HCT 33.6 (L) 02/28/2023   MCV 94.6 02/28/2023   PLT 478 (H) 02/28/2023    Lab Results  Component Value Date   CREATININE 0.48 (L) 02/26/2023   BUN 8  02/26/2023   NA 137 02/26/2023   K 4.1 02/26/2023   CL 94 (L) 02/26/2023   CO2 29 02/26/2023    Lab Results  Component Value Date   ALT 9 02/25/2023   AST 14 (L) 02/25/2023   ALKPHOS 42 02/25/2023   BILITOT 0.2 02/25/2023     Microbiology: Recent Results (from the past 240 hours)  Respiratory (~20 pathogens) panel by PCR     Status: None   Collection Time: 02/20/23  4:42 PM   Specimen: Nasopharyngeal Swab; Respiratory  Result Value Ref Range Status   Adenovirus NOT DETECTED NOT DETECTED Final   Coronavirus 229E NOT DETECTED NOT DETECTED Final    Comment: (NOTE) The Coronavirus on the Respiratory Panel, DOES NOT test for the novel  Coronavirus (2019 nCoV)    Coronavirus HKU1 NOT DETECTED NOT DETECTED Final   Coronavirus NL63 NOT DETECTED NOT DETECTED Final   Coronavirus OC43 NOT DETECTED NOT DETECTED Final   Metapneumovirus NOT DETECTED NOT DETECTED Final   Rhinovirus / Enterovirus NOT DETECTED NOT DETECTED Final   Influenza A NOT DETECTED NOT DETECTED Final   Influenza B NOT DETECTED NOT DETECTED Final   Parainfluenza Virus 1 NOT DETECTED NOT DETECTED Final   Parainfluenza Virus 2 NOT DETECTED NOT DETECTED Final   Parainfluenza Virus 3 NOT DETECTED NOT DETECTED Final   Parainfluenza Virus 4 NOT DETECTED NOT DETECTED Final   Respiratory Syncytial Virus NOT DETECTED NOT DETECTED Final   Bordetella pertussis NOT DETECTED NOT DETECTED Final   Bordetella Parapertussis NOT DETECTED NOT DETECTED Final   Chlamydophila pneumoniae NOT DETECTED NOT DETECTED Final   Mycoplasma pneumoniae NOT DETECTED NOT DETECTED Final    Comment: Performed at [facility], [address], [place], [place] [address]  Culture, blood (Routine X 2) w Reflex to ID Panel     Status: None   Collection Time: 02/21/23  8:08 AM   Specimen: BLOOD LEFT HAND  Result Value Ref Range Status   Specimen Description BLOOD LEFT HAND  Final   Special Requests   Final    BOTTLES DRAWN AEROBIC AND ANAEROBIC  Blood Culture results may not be optimal due to an inadequate volume of blood received in culture bottles   Culture   Final    NO GROWTH 5 DAYS Performed at [facility], [address] [place], [place] [address]    Report Status 02/26/2023 FINAL  Final  Culture, blood (Routine X 2) w Reflex to ID Panel     Status: None   Collection Time: 02/21/23  8:27 AM  Specimen: BLOOD RIGHT HAND  Result Value Ref Range Status   Specimen Description BLOOD RIGHT HAND  Final   Special Requests   Final    BOTTLES DRAWN AEROBIC AND ANAEROBIC Blood Culture results may not be optimal due to an inadequate volume of blood received in culture bottles   Culture   Final    NO GROWTH 5 DAYS Performed at [facility], [address], [place], [place] [address]    Report Status 02/26/2023 FINAL  Final     Marlan Silva, NP [facility]  02/28/2023  1:02 PM

## 2023-02-28 NOTE — Progress Notes (Signed)
 Progress Note   Patient: Mathew Wyatt ZOX:[id number] DOB: [birth year] DOA: 02/08/2023     20 DOS: the patient was seen and examined on 02/28/2023   Brief hospital course: Mr. Sass was admitted to the hospital with the working diagnosis of empyema.    63 y.o. male with past medical history of COPD and a smoker who had initially presented to [facility] hospital on 02/07/2023 with shortness of breath dyspnea on exertion with productive cough.   He was noted to be tachypneic with significant leukocytosis and elevated lactate.   Patient was started on IV antibiotic.  CT scan of the chest however was concerning for empyema versus abscess.  02/08/23 Transferred to [facility] for further evaluation.    Patient developed A-fib with RVR and hypotension and was admitted to ICU service.   1/21: TCTS defer VATS. ICU for afib rvr hypotension, started on amiodarone . CT placed with immediate 2L purulent output. Initial tube lytics with additional 300cc output. ECHO neg for vegetation, EF 45-50% 1/22: repeat CT chest 1/23 new chest tube placed by IR, cultures growing Campylobacter and Peptostreptococcus 1/26 dec'd pleural output but has persistent loculated fluid collection  1/27 AF w/ RVR. Treated w/ BB. Seen by ID; vanc stopped, switched to ceftriaxone , flagyl  and azith for campylobacter. CT chest obtained. The anterior fluid collection has improved. The lateral effusion vol is less but loculated air maintains. Now has Right layering effusion, and new patchy areas of GG changes. Some mild dec in left GG changes. Repeated round 4 pleural lytics (administered in right lateral tube)  1/28: lateral CT 120 net neg  1/30 no significant change after 7 doses of pleural lytic therapy.  -1/30, seen by Dr. Deloise Ferries, patient refused VATS -1/31: Changed his mind, now agrees to surgery, TCTS notified -2/1: PCCM restarted Heparin  gtt -2/3, hemoglobin down to 6.0, heparin  held, transfusing, T CTS eval pending -2/4,  reevaluated by Dr. Deloise Ferries, plan for VATS -2/5 underwent VATS and decortication of left empyema, Dr. Deloise Ferries 02/08 continue to have air leak with cough, chest tube to water  seal.  Antibiotics now oral.  02/09 continue air leak with cough.    Assessment and Plan: * Empyema (HCC) Hydropneumothorax.  Left lower lobe pneumonia.  Patient had chest tube placement x2  Second chest tube placed on 02/10/23  Pleural fluid culture positive for Campylobacter and Peptostreptococcus.   Sp fibronolytic therapy for 7 days, with no major improvement.  CT surgery was consulted. 02/05 underwent VATS with decortication.  02/08 chest tube drainage 90 cc over last 24 hrs.  Chest tube to water  seal, continue to have air leak with coughing.   Antibiotic therapy with Augmentin  and azithromycin .  Chest tube to water  seal.   Continue incentive spirometer and out of bed.   Patient had pneumonia with sepsis during this hospitalization, now sepsis has resolved.   Acute on chronic diastolic CHF (congestive heart failure) (HCC) Echocardiogram with mild reduced LV systolic function, with EF 45 to 50%, with global hypokinesis, RV systolic function preserved, small pericardial effusion, no significant valvular disease.   Exacerbation has resolved.   Continue medical therapy with metoprolol  and spironolactone .   Atrial fibrillation with rapid ventricular response (HCC) Continue rate control with amiodarone  and metoprolol .  Continue anticoagulation with heparin , eventually transition to direct oral anticoagulants.   Pressure injury of skin Right buttock stage 1 ulcer present not clear if present admission.   COPD (chronic obstructive pulmonary disease) (HCC) No signs of exacerbation.  Continue bronchodilator therapy.  Airway  clearing techniques incentive spirometer and flutter valve,   AKI (acute kidney injury) (HCC) Hypernatremia, hypokalemia.   Follow up renal function in am.        Subjective: Patient with intermittent chest pain, improved with IV analgesics, no nausea or vomiting, he has been out of bed.   Physical Exam: Vitals:   02/28/23 0418 02/28/23 0442 02/28/23 0725 02/28/23 1100  BP:   111/68 119/73  Pulse: 88  85 90  Resp: 19  16 20   Temp:   97.8 F (36.6 C) 97.7 F (36.5 C)  TempSrc:   Oral Oral  SpO2: 94%  93% 95%  Weight:  71.7 kg    Height:       Neurology awake and alert, deconditioned ENT with mild pallor  Cardiovascular with S1 and S2 present and regular with no gallops, rubs or murmurs Respiratory with no wheezing or rhonchi, mild decreased breath sounds on the left.  Abdomen with no distention  No lower extremity edema.  Data Reviewed:    Family Communication: no family at the bedside   Disposition: Status is: Inpatient Remains inpatient appropriate because: chest tube management   Planned Discharge Destination: Home     Author: Albertus Alt, MD 02/28/2023 1:38 PM  For on call review [email or web address].

## 2023-02-28 NOTE — Progress Notes (Signed)
 Physical Therapy Treatment Patient Details Name: Mathew Wyatt MRN: [id number] DOB: Dec 02, 1960 Today's Date: 02/28/2023   History of Present Illness 63 yo male adm to [facility] 02/07/23 with SOB, DOE, productive cough with Lt empyema. 1/21 pt transferred to [facility] with placement of 2 chest tubes and went into Afib with RVR. 1/30 s/p 7 doses of pleural lytics. 2/5 VATS and decortication. PMH: insomnia, anxiety, CHF, smoker, COPD    PT Comments  Pt pleasant and willing to mobilize with increased gait tolerance. Pt performed 2 gait trials and repeated sit to stands. Pt educated for goals for stairs and walking without assist and encouraged increased time sitting and further gait trials with staff. Will continue to follow with HHPT recommended.    92% on 2L with gait HR 83   If plan is discharge home, recommend the following: Help with stairs or ramp for entrance;Assistance with cooking/housework;Assist for transportation;A little help with walking and/or transfers   Can travel by private vehicle        Equipment Recommendations  Rolling walker (2 wheels)    Recommendations for Other Services       Precautions / Restrictions Precautions Precautions: Fall;Other (comment) Precaution Comments: chest tube, monitor Spo2 Restrictions Weight Bearing Restrictions Per Provider Order: No     Mobility  Bed Mobility Overal bed mobility: Needs Assistance Bed Mobility: Supine to Sit     Supine to sit: Supervision     General bed mobility comments: supervision for lines with pt able to transition to sitting without assist with HOb 20 degrees    Transfers Overall transfer level: Modified independent   Transfers: Sit to/from Stand             General transfer comment: initial cues for hand placement to rise from bed. pt then performed 5 repeated STS without cues with good stability and speed    Ambulation/Gait Ambulation/Gait assistance: Supervision Gait Distance (Feet): 150  Feet Assistive device: Rolling walker (2 wheels) Gait Pattern/deviations: Step-through pattern, Decreased stride length   Gait velocity interpretation: 1.31 - 2.62 ft/sec, indicative of limited community ambulator   General Gait Details: cues for posture and breathing technique. pt attempted moving on RA but required 2l to maintain Spo2 >90%. Pt walked 150' then 46' after seated rest   Stairs             Wheelchair Mobility     Tilt Bed    Modified Rankin (Stroke Patients Only)       Balance Overall balance assessment: Mild deficits observed, not formally tested   Sitting balance-Leahy Scale: Good     Standing balance support: During functional activity, Bilateral upper extremity supported Standing balance-Leahy Scale: Fair Standing balance comment: static standing without support, Rw for gait                            Cognition Arousal: Alert Behavior During Therapy: WFL for tasks assessed/performed Overall Cognitive Status: Within Functional Limits for tasks assessed                                          Exercises      General Comments        Pertinent Vitals/Pain Pain Assessment Pain Assessment: 0-10 Pain Score: 3  Pain Location: chest tubes/flank Pain Descriptors / Indicators: Sore Pain Intervention(s): Limited activity within patient's tolerance, Monitored  during session, Premedicated before session, Repositioned    Home Living                          Prior Function            PT Goals (current goals can now be found in the care plan section) Acute Rehab PT Goals PT Goal Formulation: With patient Time For Goal Achievement: 03/14/23 Potential to Achieve Goals: Good Progress towards PT goals: Progressing toward goals    Frequency    Min 1X/week      PT Plan      Co-evaluation              AM-PAC PT "6 Clicks" Mobility   Outcome Measure  Help needed turning from your back to  your side while in a flat bed without using bedrails?: None Help needed moving from lying on your back to sitting on the side of a flat bed without using bedrails?: None Help needed moving to and from a bed to a chair (including a wheelchair)?: A Little Help needed standing up from a chair using your arms (e.g., wheelchair or bedside chair)?: A Little Help needed to walk in hospital room?: A Little Help needed climbing 3-5 steps with a railing? : A Lot 6 Click Score: 19    End of Session Equipment Utilized During Treatment: Oxygen Activity Tolerance: Patient tolerated treatment well Patient left: in bed;with call bell/phone within reach (Eob as pt refused chair) Nurse Communication: Mobility status PT Visit Diagnosis: Unsteadiness on feet (R26.81);Muscle weakness (generalized) (M62.81);Difficulty in walking, not elsewhere classified (R26.2)     Time: 0454-0981 PT Time Calculation (min) (ACUTE ONLY): 25 min  Charges:    $Gait Training: 8-22 mins $Therapeutic Activity: 8-22 mins PT General Charges $$ ACUTE PT VISIT: 1 Visit                     Annis Baseman, PT Acute Rehabilitation Services Office: [phone]    Jackey Mary Kwamaine Cuppett 02/28/2023, 10:17 AM

## 2023-03-01 ENCOUNTER — Inpatient Hospital Stay (HOSPITAL_COMMUNITY): Payer: Medicaid Other

## 2023-03-01 DIAGNOSIS — B954 Other streptococcus as the cause of diseases classified elsewhere: Secondary | ICD-10-CM

## 2023-03-01 LAB — CBC
HCT: 33.3 % — ABNORMAL LOW (ref 39.0–52.0)
Hemoglobin: 10.4 g/dL — ABNORMAL LOW (ref 13.0–17.0)
MCH: 29.9 pg (ref 26.0–34.0)
MCHC: 31.2 g/dL (ref 30.0–36.0)
MCV: 95.7 fL (ref 80.0–100.0)
Platelets: 499 10*3/uL — ABNORMAL HIGH (ref 150–400)
RBC: 3.48 MIL/uL — ABNORMAL LOW (ref 4.22–5.81)
RDW: 16.6 % — ABNORMAL HIGH (ref 11.5–15.5)
WBC: 8.5 10*3/uL (ref 4.0–10.5)
nRBC: 0 % (ref 0.0–0.2)

## 2023-03-01 LAB — BASIC METABOLIC PANEL
Anion gap: 7 (ref 5–15)
BUN: 14 mg/dL (ref 8–23)
CO2: 29 mmol/L (ref 22–32)
Calcium: 9.2 mg/dL (ref 8.9–10.3)
Chloride: 99 mmol/L (ref 98–111)
Creatinine, Ser: 0.89 mg/dL (ref 0.61–1.24)
GFR, Estimated: >60 mL/min (ref >60)
Glucose, Bld: 137 mg/dL — ABNORMAL HIGH (ref 70–99)
Potassium: 4.5 mmol/L (ref 3.5–5.1)
Sodium: 135 mmol/L (ref 135–145)

## 2023-03-01 LAB — HEPARIN LEVEL (UNFRACTIONATED): Heparin Unfractionated: 0.24 [IU]/mL — ABNORMAL LOW (ref 0.30–0.70)

## 2023-03-01 MED ORDER — HYDROMORPHONE HCL 1 MG/ML IJ SOLN
1.0000 mg | Freq: Four times a day (QID) | INTRAMUSCULAR | Status: DC | PRN
  Administered 2023-03-01 – 2023-03-05 (×13): 1 mg via INTRAVENOUS
  Filled 2023-03-01 (×13): qty 1

## 2023-03-01 MED ORDER — GABAPENTIN 300 MG PO CAPS
300.0000 mg | ORAL_CAPSULE | Freq: Three times a day (TID) | ORAL | Status: DC
  Administered 2023-03-01 – 2023-03-07 (×17): 300 mg via ORAL
  Filled 2023-03-01 (×17): qty 1

## 2023-03-01 NOTE — Progress Notes (Signed)
      [address]       [place] [address]             [phone]       6 Days Post-Op Procedure(s) (LRB): VIDEO ASSISTED THORACOSCOPY (VATS)/DECORTICATION (Left)  Subjective: Patient wants to know when chest tube will be removed. I explained that it is a daily evaluation because of the persistent air leak.  Objective: Vital signs in last 24 hours: Temp:  [97.6 F (36.4 C)-97.9 F (36.6 C)] 97.8 F (36.6 C) (02/11 0549) Pulse Rate:  [82-90] 85 (02/11 0048) Cardiac Rhythm: Normal sinus rhythm (02/10 1900) Resp:  [13-23] 16 (02/11 0048) BP: (96-119)/(60-73) 96/60 (02/11 0047) SpO2:  [91 %-96 %] 94 % (02/11 0048) Weight:  [70.3 kg] 70.3 kg (02/11 0549)    Intake/Output from previous day: 02/10 0701 - 02/11 0700 In: 1366.4 [P.O.:480; I.V.:886.4] Out: 650 [Urine:600; Chest Tube:50]   Physical Exam:  Cardiovascular: RRR Pulmonary: Mildly diminished on left lateral/base Abdomen: Soft, non tender, bowel sounds present. Extremities: No lower extremity edema Wounds: Dressing is clean and dry.   Chest Tube:To water seal, + air leak that increases with cough  Lab Results: CBC: Recent Labs    02/28/23 0459 03/01/23 0615  WBC 7.7 8.5  HGB 10.4* 10.4*  HCT 33.6* 33.3*  PLT 478* 499*   BMET:  No results for input(s): "NA", "K", "CL", "CO2", "GLUCOSE", "BUN", "CREATININE", "CALCIUM" in the last 72 hours.   PT/INR: No results for input(s): "LABPROT", "INR" in the last 72 hours. ABG:  INR: Will add last result for INR, ABG once components are confirmed Will add last 4 CBG results once components are confirmed  Assessment/Plan:  1. CV - Previous a fib with RVT. SR this am. On Lopressor 12.5 mg bid, Amiodarone 200 mg daily and Heparin drip. Also, on Spironolactone 12.5 mg daily. Per medicine 2.  Pulmonary - On 2L via [place]. Chest tube with 50 cc last 24 hours. Chest tube is to water seal with + air leak that increases with cough. CXR this am appears  stable (persistent left basilar pneumothorax).  Check CXR in am. Encourage incentive spirometer and flutter valve. 3. ID-On Azithromycin and Augmentin for PNA/empyema for Campylobacter and Streptococcus constellatus empyema. 4. Regarding pain control, continue Dilaudid IV Q 4 hours PRN, will give a few doses of IV Toradol now that creatinine "WNL". Continue with Oxy 10 mg every 6 hours PRN, scheduled Robaxin 500 tid, scheduled Gabapentin bid to start today.  Adeleine Pask M ZimmermanPA-C 03/01/2023,7:27 AM

## 2023-03-01 NOTE — Progress Notes (Signed)
Mobility Specialist Progress Note;   03/01/23 1430  Mobility  Activity Ambulated with assistance in hallway  Level of Assistance Standby assist, set-up cues, supervision of patient - no hands on  Assistive Device Front wheel walker  Distance Ambulated (ft) 150 ft  Activity Response Tolerated well  Mobility Referral Yes  Mobility visit 1 Mobility  Mobility Specialist Start Time (ACUTE ONLY) 1430  Mobility Specialist Stop Time (ACUTE ONLY) 1447  Mobility Specialist Time Calculation (min) (ACUTE ONLY) 17 min   Pt agreeable to mobility. On RA upon arrival. Required no physical assistance during ambulation, SV. Ambulated on 2LO2 to stay [facility]. No c/o during session. Pt returned safely back to bed with all needs met.   Caesar Bookman Mobility Specialist Please contact via SecureChat or [employer] [phone]

## 2023-03-01 NOTE — Progress Notes (Signed)
PHARMACY - ANTICOAGULATION CONSULT NOTE  Pharmacy Consult for IV heparin Indication: atrial fibrillation  No Known Allergies  Patient Measurements: Height: 6' (182.9 cm) Weight: 70.3 kg (154 lb 15.7 oz) IBW/kg (Calculated) : 77.6 Heparin Dosing Weight: ~ 80 kg  Vital Signs: Temp: 97.7 F (36.5 C) (02/11 0737) Temp Source: Axillary (02/11 0737) BP: 121/68 (02/11 0737) Pulse Rate: 90 (02/11 0737)  Labs: Recent Labs    02/27/23 0500 02/27/23 1100 02/28/23 0459 02/28/23 1730 03/01/23 0615 03/01/23 0616 03/01/23 0630  HGB 9.8*  --  10.4*  --  10.4*  --   --   HCT 31.3*  --  33.6*  --  33.3*  --   --   PLT 365  --  478*  --  499*  --   --   HEPARINUNFRC 0.36   < > 0.17* 0.52  --  0.24*  --   CREATININE  --   --   --   --   --   --  0.89   < > = values in this interval not displayed.    Estimated Creatinine Clearance: 85.6 mL/min (by C-G formula based on SCr of 0.89 mg/dL).  Assessment: 63 yo male with new onset afib, initially converted to NSR following placement of chest tube however patient had converted back to afib. He was on IV heparin and this was discontinued with hg 6.0 on 2/2 then it was held for VATs on 2/4. Okay to resume IV heparin per Dr. Cliffton Asters.   Heparin level is sub-therapeutic at 0.24 on infusion at 1600 units/hr. CBC stable.   Goal of Therapy:  Heparin level 0.3-0.5 units/ml Monitor platelets by anticoagulation protocol: Yes   Plan:  -Increase heparin to 1650 units/hr -Heparin level and CBC in am   Harland German, PharmD Clinical Pharmacist **Pharmacist phone directory can now be found on [email or web address] ([email or web address]).  Listed under [facility].

## 2023-03-01 NOTE — Progress Notes (Signed)
 Progress Note   Patient: Mathew Wyatt UJW:[id number] DOB: Jun 09, 1960 DOA: 02/08/2023     21 DOS: the patient was seen and examined on 03/01/2023   Brief hospital course: Mr. Bordas was admitted to the hospital with the working diagnosis of empyema.    63 y.o. male with past medical history of COPD and a smoker who had initially presented to [facility] hospital on 02/07/2023 with shortness of breath dyspnea on exertion with productive cough.   He was noted to be tachypneic with significant leukocytosis and elevated lactate.   Patient was started on IV antibiotic.  CT scan of the chest however was concerning for empyema versus abscess.  02/08/23 Transferred to [facility] for further evaluation.    Patient developed A-fib with RVR and hypotension and was admitted to ICU service.   1/21: TCTS defer VATS. ICU for afib rvr hypotension, started on amiodarone. CT placed with immediate 2L purulent output. Initial tube lytics with additional 300cc output. ECHO neg for vegetation, EF 45-50% 1/22: repeat CT chest 1/23 new chest tube placed by IR, cultures growing Campylobacter and Peptostreptococcus 1/26 dec'd pleural output but has persistent loculated fluid collection  1/27 AF w/ RVR. Treated w/ BB. Seen by ID; vanc stopped, switched to ceftriaxone, flagyl and azith for campylobacter. CT chest obtained. The anterior fluid collection has improved. The lateral effusion vol is less but loculated air maintains. Now has Right layering effusion, and new patchy areas of GG changes. Some mild dec in left GG changes. Repeated round 4 pleural lytics (administered in right lateral tube)  1/28: lateral CT 120 net neg  1/30 no significant change after 7 doses of pleural lytic therapy.  -1/30, seen by Dr. Cliffton Asters, patient refused VATS -1/31: Changed his mind, now agrees to surgery, TCTS notified -2/1: PCCM restarted Heparin gtt -2/3, hemoglobin down to 6.0, heparin held, transfusing, T CTS eval pending -2/4,  reevaluated by Dr. Cliffton Asters, plan for VATS -2/5 underwent VATS and decortication of left empyema, Dr. Cliffton Asters 02/08 continue to have air leak with cough, chest tube to water seal.  Antibiotics now oral.  02/09 continue air leak with cough.  02/10 persistent small left base pneumothorax.  Assessment and Plan: * Empyema (HCC) Hydropneumothorax.  Left lower lobe pneumonia.  Patient had chest tube placement x2  Second chest tube placed on 02/10/23  Pleural fluid culture positive for Campylobacter and Peptostreptococcus.   Sp fibronolytic therapy for 7 days, with no major improvement.  CT surgery was consulted. 02/05 underwent VATS with decortication.   Chest tube drainage over the last 24 hrs 50 ml.  Chest tube to water seal, continue to have air leak with coughing.   Antibiotic therapy with Augmentin and azithromycin.  Chest tube to water seal.   Continue incentive spirometer and out of bed.   Patient had pneumonia with sepsis during this hospitalization, now sepsis has resolved.   Acute on chronic diastolic CHF (congestive heart failure) (HCC) Echocardiogram with mild reduced LV systolic function, with EF 45 to 50%, with global hypokinesis, RV systolic function preserved, small pericardial effusion, no significant valvular disease.   Exacerbation has resolved.   Continue medical therapy with metoprolol and spironolactone.   Atrial fibrillation with rapid ventricular response (HCC) Continue rate control with amiodarone and metoprolol.  Continue anticoagulation with heparin, will check with CT surgery if can transition to DOAC.   Pressure injury of skin Right buttock stage 1 ulcer present not clear if present admission.   COPD (chronic obstructive pulmonary disease) (HCC)  No signs of exacerbation.  Continue bronchodilator therapy.  Airway clearing techniques incentive spirometer and flutter valve,   AKI (acute kidney injury) (HCC) Hypernatremia, hypokalemia.   Renal  function has normalized with a serum cr at 0,89 with K at 4,5 and serum bicarbonate at 29  Na 135  No need for daily labs.       Subjective: Patient with intermittent chest pain, requiring IV and oral analgesics, no dyspnea and no edema. Tolerating po well, he had good sleep last night.   Physical Exam: Vitals:   03/01/23 0048 03/01/23 0549 03/01/23 0737 03/01/23 1100  BP:   121/68 102/65  Pulse: 85  90 82  Resp: 16  14 12   Temp:  97.8 F (36.6 C) 97.7 F (36.5 C) 97.6 F (36.4 C)  TempSrc:  Oral Axillary Oral  SpO2: 94%  92% 94%  Weight:  70.3 kg    Height:       Neurology awake and alert ENT with mild pallor with no icterus Cardiovascular with S1 and S2 present and regular with no gallops, rubs or murmurs Respiratory with no rales or wheezing, no rhonchi, mild decreased breath sounds at the left base with poor inspiratory effort Abdomen with no distention  No lower extremity edema  Data Reviewed:    Family Communication: no family at the bedside   Disposition: Status is: Inpatient Remains inpatient appropriate because: chest tube for persistent left pneumothorax.   Planned Discharge Destination: Home      Author: Coralie Keens, MD 03/01/2023 2:04 PM  For on call review [email or web address].

## 2023-03-01 NOTE — Progress Notes (Signed)
[facility]    Date of Admission:  02/08/2023   Total days of antibiotics amox/clav and azithromycin   ID: Galvin Aversa is a 63 y.o. 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) Principal Problem:   Empyema (HCC) Active Problems:   Pressure injury of skin   Atrial fibrillation with rapid ventricular response (HCC)   Acute on chronic diastolic CHF (congestive heart failure) (HCC)   COPD (chronic obstructive pulmonary disease) (HCC)   AKI (acute kidney injury) (HCC)    Subjective: Afebrile, still some discomfort from chest tube, awaiting impatiently for airleak to resolve  Medications:   amiodarone  200 mg Oral Daily   amoxicillin-clavulanate  1 tablet Oral Q12H   azithromycin  500 mg Oral Daily   Chlorhexidine Gluconate Cloth  6 each Topical Q0600   feeding supplement  237 mL Oral BID BM   gabapentin  300 mg Oral BID   Gerhardt's butt cream   Topical TID   lidocaine  5 mL Intradermal Once   melatonin  5 mg Oral QHS   methocarbamol (ROBAXIN) injection  500 mg Intravenous Q8H   Or   methocarbamol  500 mg Oral Q8H   metoprolol tartrate  12.5 mg Oral BID   mupirocin ointment   Nasal BID   nicotine  21 mg Transdermal Daily   pantoprazole  40 mg Oral Daily   sodium chloride flush  10 mL Intrapleural Q8H   sodium chloride flush  10-40 mL Intracatheter Q12H   spironolactone  12.5 mg Oral Daily    Objective: Vital signs in last 24 hours: Temp:  [97.6 F (36.4 C)-97.9 F (36.6 C)] 97.6 F (36.4 C) (02/11 1100) Pulse Rate:  [82-90] 82 (02/11 1100) Resp:  [12-23] 12 (02/11 1100) BP: (96-121)/(60-71) 102/65 (02/11 1100) SpO2:  [91 %-96 %] 94 % (02/11 1100) Weight:  [70.3 kg] 70.3 kg (02/11 0549)  Physical Exam  Constitutional: He is oriented to person, place, and time. He appears well-developed and well-nourished. No distress.  HENT:  Mouth/Throat: Oropharynx is clear and moist. No oropharyngeal exudate.  Cardiovascular: Normal rate,  regular rhythm and normal heart sounds. Exam reveals no gallop and no friction rub.  No murmur heard.  Pulmonary/Chest: Effort normal and breath sounds normal. No respiratory distress. He has no wheezes. Decrease breath sounds on left baseLeft sided chest tube with serosanginous fluid  Abdominal: Soft. Bowel sounds are normal. He exhibits no distension. There is no tenderness.  Lymphadenopathy:  He has no cervical adenopathy.  Neurological: He is alert and oriented to person, place, and time.  Skin: Skin is warm and dry. No rash noted. No erythema.  Psychiatric: He has a normal mood and affect. His behavior is normal.    Lab Results Recent Labs    02/28/23 0459 03/01/23 0615 03/01/23 0630  WBC 7.7 8.5  --   HGB 10.4* 10.4*  --   HCT 33.6* 33.3*  --   NA  --   --  135  K  --   --  4.5  CL  --   --  99  CO2  --   --  29  BUN  --   --  14  CREATININE  --   --  0.89    Microbiology: Organism ID, Bacteria STREPTOCOCCUS CONSTELLATUS  Resulting Agency [facility]     Susceptibility   Streptococcus constellatus    MIC    CEFTRIAXONE <=0.12 SENS... Sensitive    ERYTHROMYCIN <=0.12 SENS... Sensitive  LEVOFLOXACIN <=0.25 SENS... Sensitive    PENICILLIN <=0.06 SENS... Sensitive    VANCOMYCIN 0.5 SENSITIVE Sensitive       MODERATE CAMPYLOBACTER SPECIES MODERATE PEPTOSTREPTOCOCCUS MICROS WITHIN MIXED ANAEROBIC ORGANISMS   Studies/Results: DG CHEST PORT 1 VIEW Result Date: 03/01/2023 CLINICAL DATA:  Follow-up pneumothorax EXAM: PORTABLE CHEST 1 VIEW COMPARISON:  02/28/2023 FINDINGS: Pigtail catheter is again noted on the left. The overall appearance of the lateral pneumothorax is stable. Persistent pleural based density in the left lung is noted. No new focal abnormality is seen. IMPRESSION: Stable appearance of the left pneumothorax and parenchymal opacities when compared with the prior day. Electronically Signed   By: Alcide Clever M.D.   On: 03/01/2023 09:48   DG CHEST PORT  1 VIEW Result Date: 02/28/2023 CLINICAL DATA:  Pneumothorax. EXAM: PORTABLE CHEST 1 VIEW COMPARISON:  02/27/2023 FINDINGS: Left chest tube is stable in position. There is a persistent lateral left pneumothorax that has minimally changed. Parenchymal densities in the mid and lower left lung are unchanged. Left arm PICC line is in the SVC region and stable. Heart size is stable. No focal right lung disease. IMPRESSION: 1. Persistent left pneumothorax with a left chest tube in place. No significant change. 2. Persistent parenchymal densities in the left lung. Electronically Signed   By: Richarda Overlie M.D.   On: 02/28/2023 09:53     Assessment/Plan: Polymicrobial pneumonia with empyema s/p chest tube = recommend to continue with amox/clav plus azithromycin. Currently day #22 of antibiotics. Once chest tube is removed will have better sense of end date for abtx  Left sided pneumothorax = on today's cxr appears stable and persistent. Continue with chest tube, defer to CT surgery for management.  Long term abtx management = appears to tolerate without difficulty.  [facility] Pager: [phone]  03/01/2023, 1:34 PM

## 2023-03-02 ENCOUNTER — Inpatient Hospital Stay (HOSPITAL_COMMUNITY): Payer: Medicaid Other

## 2023-03-02 LAB — CBC
HCT: 34.5 % — ABNORMAL LOW (ref 39.0–52.0)
Hemoglobin: 10.9 g/dL — ABNORMAL LOW (ref 13.0–17.0)
MCH: 30 pg (ref 26.0–34.0)
MCHC: 31.6 g/dL (ref 30.0–36.0)
MCV: 95 fL (ref 80.0–100.0)
Platelets: 497 10*3/uL — ABNORMAL HIGH (ref 150–400)
RBC: 3.63 MIL/uL — ABNORMAL LOW (ref 4.22–5.81)
RDW: 16.7 % — ABNORMAL HIGH (ref 11.5–15.5)
WBC: 9.5 10*3/uL (ref 4.0–10.5)
nRBC: 0 % (ref 0.0–0.2)

## 2023-03-02 LAB — HEPARIN LEVEL (UNFRACTIONATED): Heparin Unfractionated: 0.29 [IU]/mL — ABNORMAL LOW (ref 0.30–0.70)

## 2023-03-02 MED ORDER — APIXABAN 5 MG PO TABS
5.0000 mg | ORAL_TABLET | Freq: Two times a day (BID) | ORAL | Status: DC
  Administered 2023-03-02 – 2023-03-07 (×11): 5 mg via ORAL
  Filled 2023-03-02 (×11): qty 1

## 2023-03-02 NOTE — Progress Notes (Addendum)
      [address]       [place] [address]             [phone]       7 Days Post-Op Procedure(s) (LRB): VIDEO ASSISTED THORACOSCOPY (VATS)/DECORTICATION (Left)  Subjective: Patient   Objective: Vital signs in last 24 hours: Temp:  [97.6 F (36.4 C)-98.5 F (36.9 C)] 98.5 F (36.9 C) (02/12 0345) Pulse Rate:  [82-91] 86 (02/12 0345) Cardiac Rhythm: Normal sinus rhythm (02/11 1900) Resp:  [12-18] 18 (02/12 0345) BP: (102-108)/(60-68) 106/68 (02/12 0345) SpO2:  [90 %-97 %] 97 % (02/12 0345) Weight:  [70.3 kg] 70.3 kg (02/12 0345)    Intake/Output from previous day: 02/11 0701 - 02/12 0700 In: 1088.7 [P.O.:720; I.V.:368.7] Out: 2265 [Urine:2225; Chest Tube:40]   Physical Exam:  Cardiovascular: RRR Pulmonary: Mildly diminished on left lateral/base Abdomen: Soft, non tender, bowel sounds present. Extremities: No lower extremity edema Wounds: Dressing is clean and dry.   Chest Tube:To water seal. Cough was not forceful this am;tidling and one bubble second cough  Lab Results: CBC: Recent Labs    03/01/23 0615 03/02/23 0612  WBC 8.5 9.5  HGB 10.4* 10.9*  HCT 33.3* 34.5*  PLT 499* 497*   BMET:  Recent Labs    03/01/23 0630  NA 135  K 4.5  CL 99  CO2 29  GLUCOSE 137*  BUN 14  CREATININE 0.89  CALCIUM 9.2     PT/INR: No results for input(s): "LABPROT", "INR" in the last 72 hours. ABG:  INR: Will add last result for INR, ABG once components are confirmed Will add last 4 CBG results once components are confirmed  Assessment/Plan:  1. CV - Previous a fib with RVT. SR this am. On Lopressor 12.5 mg bid, Amiodarone 200 mg daily and Heparin drip. Also, on Spironolactone 12.5 mg daily. Per medicine 2.  Pulmonary - History of COPD-On room air. Chest tube with 40 cc last recorded for last 12 hours. Chest tube is to water seal. His cough was not forceful this am;tidling and one bubble second cough. CXR this am appears stable (persistent  left basilar pneumothorax).  ? Clamping trial in am. Check CXR in am. Encourage incentive spirometer and flutter valve. 3. ID-On Azithromycin and Augmentin for PNA/empyema for Campylobacter and Streptococcus constellatus empyema.   Albaraa Swingle M ZimmermanPA-C 03/02/2023,7:38 AM

## 2023-03-02 NOTE — Progress Notes (Signed)
PHARMACY - ANTICOAGULATION CONSULT NOTE  Pharmacy Consult for IV heparin Indication: atrial fibrillation  No Known Allergies  Patient Measurements: Height: 6' (182.9 cm) Weight: 70.3 kg (154 lb 15.7 oz) IBW/kg (Calculated) : 77.6 Heparin Dosing Weight: ~ 80 kg  Vital Signs: Temp: 98.2 F (36.8 C) (02/12 0832) Temp Source: Oral (02/12 0832) BP: 106/72 (02/12 0832) Pulse Rate: 88 (02/12 0832)  Labs: Recent Labs    02/28/23 0459 02/28/23 1730 03/01/23 0615 03/01/23 0616 03/01/23 0630 03/02/23 0612  HGB 10.4*  --  10.4*  --   --  10.9*  HCT 33.6*  --  33.3*  --   --  34.5*  PLT 478*  --  499*  --   --  497*  HEPARINUNFRC 0.17* 0.52  --  0.24*  --  0.29*  CREATININE  --   --   --   --  0.89  --     Estimated Creatinine Clearance: 85.6 mL/min (by C-G formula based on SCr of 0.89 mg/dL).  Assessment: 63 yo male with new onset afib, initially converted to NSR following placement of chest tube however patient had converted back to afib. He was on IV heparin and this was discontinued with hg 6.0 on 2/2 then it was held for VATs on 2/4. Okay to resume IV heparin per Dr. Cliffton Asters.   Heparin level is sub-therapeutic at 0.29 on infusion at 1650 units/hr. CBC stable.   Goal of Therapy:  Heparin level 0.3-0.5 units/ml Monitor platelets by anticoagulation protocol: Yes   Plan:  -Increase heparin to 1700 units/hr -Heparin level and CBC in am   Harland German, PharmD Clinical Pharmacist **Pharmacist phone directory can now be found on [email or web address] ([email or web address]).  Listed under [facility].

## 2023-03-02 NOTE — Progress Notes (Signed)
Physical Therapy Treatment Patient Details Name: Mathew Wyatt MRN: [id number] DOB: 08-07-1960 Today's Date: 03/02/2023   History of Present Illness 63 yo male adm to [facility] 02/07/23 with SOB, DOE, productive cough with Lt empyema. 1/21 pt transferred to [facility] with placement of 2 chest tubes and went into Afib with RVR. 1/30 s/p 7 doses of pleural lytics. 2/5 VATS and decortication. PMH: insomnia, anxiety, CHF, smoker, COPD    PT Comments  Pt is slowly progressing towards goals. Currently pt is able to ambulate 300 ft at supervision with assist with lines and chest tube. Pt is supervision for sit to stand from EOB in lowest position and mod I for bed mobility.  Pt requires 1 L/min of O2 via [place] this session during gait to maintain O2 sats above 90%. Due to pt current functional status, home set up and available assistance at home recommending skilled physical therapy services 3x/week in order to address strength, balance and functional mobility to decrease risk for falls, injury and re-hospitalization.      If plan is discharge home, recommend the following: Help with stairs or ramp for entrance;Assistance with cooking/housework;Assist for transportation;A little help with walking and/or transfers     Equipment Recommendations  Rolling walker (2 wheels)       Precautions / Restrictions Precautions Precautions: Fall;Other (comment) Precaution/Restrictions Comments: chest tube, monitor Spo2 Restrictions Weight Bearing Restrictions Per Provider Order: No     Mobility  Bed Mobility Overal bed mobility: Needs Assistance Bed Mobility: Supine to Sit, Sit to Supine Rolling: Modified independent (Device/Increase time)   Supine to sit: Modified independent (Device/Increase time) Sit to supine: Modified independent (Device/Increase time)   General bed mobility comments: Mod I, slight increase in time.    Transfers Overall transfer level: Modified independent Equipment used: Rolling walker (2  wheels) Transfers: Sit to/from Stand Sit to Stand: Supervision           General transfer comment: supervision. Encouragement to stand from EOB at lowest position. Pt initially wanted EOB raised. pt did not need any physical assistance.    Ambulation/Gait Ambulation/Gait assistance: Supervision Gait Distance (Feet): 300 Feet Assistive device: Rolling walker (2 wheels) Gait Pattern/deviations: Step-through pattern, Decreased stride length Gait velocity: decreased Gait velocity interpretation: 1.31 - 2.62 ft/sec, indicative of limited community ambulator   General Gait Details: Pt able to stay at 93% on 1L o2 via [place] for 300 ft of gait.       Balance Overall balance assessment: Mild deficits observed, not formally tested Sitting-balance support: Feet unsupported, No upper extremity supported, Single extremity supported Sitting balance-Leahy Scale: Good Sitting balance - Comments: No overt LOB   Standing balance support: During functional activity, Bilateral upper extremity supported, No upper extremity supported Standing balance-Leahy Scale: Fair Standing balance comment: static standing without support, Rw for gait      Communication Communication Communication: No apparent difficulties  Cognition Arousal: Alert Behavior During Therapy: WFL for tasks assessed/performed   PT - Cognitive impairments: No apparent impairments           Following commands: Intact            General Comments General comments (skin integrity, edema, etc.): O2 sats remained 90's on 1 L O2 via [place].      Pertinent Vitals/Pain Pain Assessment Pain Assessment: Faces Faces Pain Scale: Hurts a little bit Breathing: normal Pain Location: chest tubes/flank Pain Descriptors / Indicators: Sore Pain Intervention(s): Monitored during session, Limited activity within patient's tolerance, Premedicated before session  PT Goals (current goals can now be found in the care plan section) Acute  Rehab PT Goals Patient Stated Goal: get stronger and go home PT Goal Formulation: With patient Time For Goal Achievement: 03/14/23 Potential to Achieve Goals: Good Progress towards PT goals: Progressing toward goals    Frequency    Min 1X/week      PT Plan  Continue with current POC        AM-PAC PT "6 Clicks" Mobility   Outcome Measure  Help needed turning from your back to your side while in a flat bed without using bedrails?: None Help needed moving from lying on your back to sitting on the side of a flat bed without using bedrails?: None Help needed moving to and from a bed to a chair (including a wheelchair)?: A Little Help needed standing up from a chair using your arms (e.g., wheelchair or bedside chair)?: A Little Help needed to walk in hospital room?: A Little Help needed climbing 3-5 steps with a railing? : A Little 6 Click Score: 20    End of Session Equipment Utilized During Treatment: Oxygen;Gait belt Activity Tolerance: Patient tolerated treatment well Patient left: in bed;with call bell/phone within reach Nurse Communication: Mobility status PT Visit Diagnosis: Unsteadiness on feet (R26.81);Muscle weakness (generalized) (M62.81);Difficulty in walking, not elsewhere classified (R26.2)     Time: 8657-8469 PT Time Calculation (min) (ACUTE ONLY): 23 min  Charges:    $Therapeutic Activity: 23-37 mins PT General Charges $$ ACUTE PT VISIT: 1 Visit                    Harrel Carina, DPT, CLT  Acute Rehabilitation Services Office: [phone] (Secure chat preferred)   Claudia Desanctis 03/02/2023, 1:50 PM

## 2023-03-02 NOTE — Progress Notes (Signed)
Mobility Specialist Progress Note;   03/02/23 1000  Mobility  Activity Ambulated with assistance in hallway  Level of Assistance Standby assist, set-up cues, supervision of patient - no hands on  Assistive Device Front wheel walker  Distance Ambulated (ft) 150 ft  Activity Response Tolerated well  Mobility Referral Yes  Mobility visit 1 Mobility  Mobility Specialist Start Time (ACUTE ONLY) 1000  Mobility Specialist Stop Time (ACUTE ONLY) 1015  Mobility Specialist Time Calculation (min) (ACUTE ONLY) 15 min   Pt agreeable to mobility. Required no physical assistance during ambulation, SV. Ambulated on 1LO2 to stay [facility], when receiving accurate pleth reading. No c/o during session. Pt returned back to bed with all needs met, VSSon RA.   Caesar Bookman Mobility Specialist Please contact via SecureChat or [employer] [phone]

## 2023-03-02 NOTE — Progress Notes (Signed)
PROGRESS NOTE  Mathew Wyatt UJW:[id number] DOB: 1960-04-25 DOA: 02/08/2023 PCP: Patient, No Pcp Per   LOS: 22 days   Brief narrative:  Mathew Wyatt is a 63 y.o. male with past medical history of COPD and a smoker who had initially presented to [facility] hospital on 02/07/2023 with shortness of breath dyspnea on exertion with productive cough.  He was noted to be tachypneic with significant leukocytosis and elevated lactate.  Patient was started on IV antibiotic.  CT scan of the chest however was concerning for empyema versus abscess so he was transferred to [facility] on 02/08/2023 for further evaluation.  Patient however went into A-fib with RVR and hypotension and was admitted to ICU service.   1/21: TCTS defer VATS. ICU for afib rvr hypotension, started on amiodarone. CT placed with immediate 2L purulent output. Initial tube lytics with additional 300cc output. ECHO neg for vegetation, EF 45-50% 1/22: repeat CT chest 1/23 new chest tube placed by IR, cultures growing Campylobacter and Peptostreptococcus 1/26 dec'd pleural output but has persistent loculated fluid collection  1/27 AF w/ RVR. Treated w/ BB. Seen by ID; vanc stopped, switched to ceftriaxone, flagyl and azith for campylobacter. CT chest obtained. The anterior fluid collection has improved. The lateral effusion vol is less but loculated air maintains. Now has Right layering effusion, and new patchy areas of GG changes. Some mild dec in left GG changes. Repeated round 4 pleural lytics (administered in right lateral tube)  1/28: lateral CT 120 net neg  1/30 no significant change after 7 doses of pleural lytic therapy.  -1/30, seen by Dr. Cliffton Asters, patient refused VATS -1/31: Changed his mind, now agrees to surgery, TCTS notified -2/1: PCCM restarted Heparin gtt -2/3, hemoglobin down to 6.0, heparin held, transfusing, T CTS eval pending -2/4, reevaluated by Dr. Cliffton Asters, plan for VATS -2/5 underwent VATS and decortication of left  empyema, Dr. Cliffton Asters -2/8, persistent air leak, chest tube to waterseal, antibiotics changed to PO -2/10 persistent small left base pneumothorax  Subjective: -Feels well, wants to go home, ambulating better  Assessment/Plan:  Sepsis, left empyema, pneumonia Acute hypoxic respiratory failure -Status post chest tube placement x2.  Second chest tube placement by IR 02/10/2023.  -Has been on multiple different combinations of antibiotics -Pleural fluid cultures initially grew Campylobacter and Peptostreptococcus, then repeat cultures with Streptococcus constellatus, pansensitive -CT surgery, pulmonary and ID following.    -Has completed 7 doses of fibrinolytic therapy without considerable improvement, T CTS consulted, seen by Dr. Cliffton Asters 1/30 patient refused VATS/decortication,  -1/31 changed his mind -Finally underwent VATS/decortication 2/5 -Now on Augmentin, azithromycin day 22 of ABX -Chest tube management per T CTS  Dental caries --Orthopantogram with dental caries, unfortunately no inpatient dental coverage available now, would need outpatient follow-up  New onset atrial fibrillation with RVR.   -Was on amiodarone drip, then changed to p.o., has converted to sinus rhythm, -we were holding heparin with empyema and ongoing fibrinolytic therapy -Now stable, still on IV heparin, will discuss with T CTS regarding DOAC  Acute on chronic anemia -Hemoglobin trended down to 3 days ago, transfused -Hemoglobin now stable, back on anticoagulation  Acute systolic congestive heart failure with moderately reduced ejection fraction.  2D echocardiogram 45 to 50% with global hypokinesis. 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  Continue nebulizers.   insomnia anxiety.   -On muscle relaxant Atarax Ambien and pain medication -xanax PRN  DVT prophylaxis: IV  heparin disposition: Home pending resolution of air leak    Code Status:    Code Status: Full Code  Family Communication: None at bedside  Consultants: CT surgery PCCM Interventional radiology Cardiology  Procedures: Left-sided chest tube placement x 2. Fibrinolytic treatment x 3  2/5, VATS and decortication  Anti-infectives (From admission, onward)    Start     Dose/Rate Route Frequency Ordered Stop   02/25/23 1200  amoxicillin-clavulanate (AUGMENTIN) 875-125 MG per tablet 1 tablet        1 tablet Oral Every 12 hours 02/25/23 0919     02/24/23 1600  Ampicillin-Sulbactam (UNASYN) 3 g in sodium chloride 0.9 % 100 mL IVPB  Status:  Discontinued        3 g 200 mL/hr over 30 Minutes Intravenous Every 6 hours 02/24/23 0914 02/25/23 0919   02/20/23 1615  metroNIDAZOLE (FLAGYL) tablet 500 mg  Status:  Discontinued        500 mg Oral Every 8 hours 02/20/23 1516 02/24/23 0914   02/20/23 1615  ceFEPIme (MAXIPIME) 2 g in sodium chloride 0.9 % 100 mL IVPB  Status:  Discontinued        2 g 200 mL/hr over 30 Minutes Intravenous Every 8 hours 02/20/23 1519 02/24/23 0914   02/17/23 2000  Ampicillin-Sulbactam (UNASYN) 3 g in sodium chloride 0.9 % 100 mL IVPB  Status:  Discontinued        3 g 200 mL/hr over 30 Minutes Intravenous Every 6 hours 02/17/23 0917 02/20/23 1516   02/14/23 2200  metroNIDAZOLE (FLAGYL) tablet 500 mg  Status:  Discontinued        500 mg Oral Every 12 hours 02/14/23 1412 02/17/23 0917   02/14/23 2000  cefTRIAXone (ROCEPHIN) 2 g in sodium chloride 0.9 % 100 mL IVPB  Status:  Discontinued        2 g 200 mL/hr over 30 Minutes Intravenous Every 24 hours 02/14/23 1410 02/17/23 0917   02/14/23 1500  azithromycin (ZITHROMAX) tablet 500 mg        500 mg Oral Daily 02/14/23 1410     02/12/23 0200  vancomycin (VANCOREADY) IVPB 1500 mg/300 mL  Status:  Discontinued        1,500 mg 150 mL/hr over 120 Minutes Intravenous Every 12 hours 02/11/23 1301 02/14/23 1410   02/11/23 1430   vancomycin (VANCOCIN) 750 mg in sodium chloride 0.9 % 250 mL IVPB  Status:  Discontinued        750 mg 265 mL/hr over 60 Minutes Intravenous  Once 02/11/23 1309 02/11/23 1354   02/11/23 1430  vancomycin (VANCOCIN) 750 mg in sodium chloride 0.9 % 250 mL IVPB        750 mg 250 mL/hr over 60 Minutes Intravenous  Once 02/11/23 1354 02/11/23 1543   02/11/23 1400  vancomycin (VANCOREADY) IVPB 750 mg/150 mL  Status:  Discontinued        750 mg 150 mL/hr over 60 Minutes Intravenous  Once 02/11/23 1301 02/11/23 1309   02/10/23 2130  metroNIDAZOLE (FLAGYL) IVPB 500 mg  Status:  Discontinued        500 mg 100 mL/hr over 60 Minutes Intravenous Every 12 hours 02/10/23 1434 02/14/23 1410   02/08/23 1730  metroNIDAZOLE (FLAGYL) IVPB 500 mg  Status:  Discontinued        500 mg 100 mL/hr over 60 Minutes Intravenous Every 8 hours 02/08/23 1637 02/10/23 1434   02/08/23 0400  vancomycin (VANCOREADY) IVPB 750 mg/150 mL  Status:  Discontinued        750 mg 150 mL/hr over 60 Minutes Intravenous Every 12 hours 02/08/23 0324 02/08/23 0328   02/08/23 0400  ceFEPIme (MAXIPIME) 2 g in sodium chloride 0.9 % 100 mL IVPB  Status:  Discontinued        2 g 200 mL/hr over 30 Minutes Intravenous Every 8 hours 02/08/23 0324 02/14/23 1410   02/08/23 0400  vancomycin (VANCOCIN) 750 mg in sodium chloride 0.9 % 250 mL IVPB  Status:  Discontinued        750 mg 250 mL/hr over 60 Minutes Intravenous Every 12 hours 02/08/23 0328 02/11/23 1258       Objective: Vitals:   03/02/23 0345 03/02/23 0832  BP: 106/68 106/72  Pulse: 86 88  Resp: 18 16  Temp: 98.5 F (36.9 C) 98.2 F (36.8 C)  SpO2: 97% 91%    Intake/Output Summary (Last 24 hours) at 03/02/2023 1021 Last data filed at 03/02/2023 0900 Gross per 24 hour  Intake 968.72 ml  Output 2305 ml  Net -1336.28 ml   Filed Weights   02/28/23 0442 03/01/23 0549 03/02/23 0345  Weight: 71.7 kg 70.3 kg 70.3 kg   Body mass index is 21.02 kg/m.   Physical  Exam:  Pleasant chronically ill male sitting up in bed, AAOx3 HEENT: No JVD CVS: S1-S2, regular rhythm Lungs: Decreased breath sounds at the bases, chest tube Abdomen: Soft, nontender, bowel sounds present Extremities: No edema  Data Review: I have personally reviewed the following laboratory data and studies,  CBC: Recent Labs  Lab 02/26/23 0435 02/27/23 0500 02/28/23 0459 03/01/23 0615 03/02/23 0612  WBC 8.3 6.0 7.7 8.5 9.5  HGB 10.1* 9.8* 10.4* 10.4* 10.9*  HCT 31.8* 31.3* 33.6* 33.3* 34.5*  MCV 93.8 93.2 94.6 95.7 95.0  PLT 380 365 478* 499* 497*   Basic Metabolic Panel: Recent Labs  Lab 02/24/23 0330 02/25/23 0500 02/26/23 0435 03/01/23 0630  NA 136 148* 137 135  K 4.5 3.4* 4.1 4.5  CL 102 109 94* 99  CO2 27 26 29 29   GLUCOSE 261* 72 116* 137*  BUN 21 18 8 14   CREATININE 0.61 1.30* 0.48* 0.89  CALCIUM 8.7* 7.2* 9.9 9.2   Liver Function Tests: Recent Labs  Lab 02/25/23 0500  AST 14*  ALT 9  ALKPHOS 42  BILITOT 0.2  PROT 5.2*  ALBUMIN 1.6*    No results for input(s): "LIPASE", "AMYLASE" in the last 168 hours.  No results for input(s): "AMMONIA" in the last 168 hours. Cardiac Enzymes: No results for input(s): "CKTOTAL", "CKMB", "CKMBINDEX", "TROPONINI" in the last 168 hours. BNP (last 3 results) No results for input(s): "BNP" in the last 8760 hours.  ProBNP (last 3 results) No results for input(s): "PROBNP" in the last 8760 hours.  CBG: No results for input(s): "GLUCAP" in the last 168 hours.  Recent Results (from the past 240 hours)  Respiratory (~20 pathogens) panel by PCR     Status: None   Collection Time: 02/20/23  4:42 PM   Specimen: Nasopharyngeal Swab; Respiratory  Result Value Ref Range Status   Adenovirus NOT DETECTED NOT DETECTED Final   Coronavirus 229E NOT DETECTED NOT DETECTED Final    Comment: (NOTE) The Coronavirus on the Respiratory Panel, DOES NOT test for the novel  Coronavirus (2019 nCoV)    Coronavirus HKU1 NOT  DETECTED NOT DETECTED Final   Coronavirus NL63 NOT DETECTED NOT DETECTED Final   Coronavirus OC43 NOT DETECTED NOT DETECTED Final  Metapneumovirus NOT DETECTED NOT DETECTED Final   Rhinovirus / Enterovirus NOT DETECTED NOT DETECTED Final   Influenza A NOT DETECTED NOT DETECTED Final   Influenza B NOT DETECTED NOT DETECTED Final   Parainfluenza Virus 1 NOT DETECTED NOT DETECTED Final   Parainfluenza Virus 2 NOT DETECTED NOT DETECTED Final   Parainfluenza Virus 3 NOT DETECTED NOT DETECTED Final   Parainfluenza Virus 4 NOT DETECTED NOT DETECTED Final   Respiratory Syncytial Virus NOT DETECTED NOT DETECTED Final   Bordetella pertussis NOT DETECTED NOT DETECTED Final   Bordetella Parapertussis NOT DETECTED NOT DETECTED Final   Chlamydophila pneumoniae NOT DETECTED NOT DETECTED Final   Mycoplasma pneumoniae NOT DETECTED NOT DETECTED Final    Comment: Performed at [facility], [address], [place], [place] [address]  Culture, blood (Routine X 2) w Reflex to ID Panel     Status: None   Collection Time: 02/21/23  8:08 AM   Specimen: BLOOD LEFT HAND  Result Value Ref Range Status   Specimen Description BLOOD LEFT HAND  Final   Special Requests   Final    BOTTLES DRAWN AEROBIC AND ANAEROBIC Blood Culture results may not be optimal due to an inadequate volume of blood received in culture bottles   Culture   Final    NO GROWTH 5 DAYS Performed at [facility], [address] [place], [place] [address]    Report Status 02/26/2023 FINAL  Final  Culture, blood (Routine X 2) w Reflex to ID Panel     Status: None   Collection Time: 02/21/23  8:27 AM   Specimen: BLOOD RIGHT HAND  Result Value Ref Range Status   Specimen Description BLOOD RIGHT HAND  Final   Special Requests   Final    BOTTLES DRAWN AEROBIC AND ANAEROBIC Blood Culture results may not be optimal due to an inadequate volume of blood received in culture bottles   Culture   Final    NO GROWTH 5 DAYS Performed at [facility], [address], [place], [place] [address]    Report Status 02/26/2023 FINAL  Final     Studies: DG CHEST PORT 1 VIEW Result Date: 03/02/2023 CLINICAL DATA:  Left-sided pneumothorax EXAM: PORTABLE CHEST 1 VIEW COMPARISON:  03/01/2023 FINDINGS: Left chest tube is again identified and stable. Persistent lateral left pneumothorax is seen but slightly smaller than that noted on the prior exam. Persistent pleural based opacity is seen. Right lung is clear. IMPRESSION: Slight decrease in left pneumothorax. Electronically Signed   By: Alcide Clever M.D.   On: 03/02/2023 09:46   DG CHEST PORT 1 VIEW Result Date: 03/01/2023 CLINICAL DATA:  Follow-up pneumothorax EXAM: PORTABLE CHEST 1 VIEW COMPARISON:  02/28/2023 FINDINGS: Pigtail catheter is again noted on the left. The overall appearance of the lateral pneumothorax is stable. Persistent pleural based density in the left lung is noted. No new focal abnormality is seen. IMPRESSION: Stable appearance of the left pneumothorax and parenchymal opacities when compared with the prior day. Electronically Signed   By: Alcide Clever M.D.   On: 03/01/2023 09:48       Zannie Cove, MD  [facility] 03/02/2023  If 7PM-7AM, please contact night-coverage

## 2023-03-02 NOTE — Plan of Care (Signed)
  Problem: Fluid Volume: Goal: Hemodynamic stability will improve Outcome: Progressing   Problem: Clinical Measurements: Goal: Diagnostic test results will improve Outcome: Progressing Goal: Signs and symptoms of infection will decrease Outcome: Progressing   Problem: Respiratory: Goal: Ability to maintain adequate ventilation will improve Outcome: Progressing   Problem: Education: Goal: Knowledge of General Education information will improve Description: Including pain rating scale, medication(s)/side effects and non-pharmacologic comfort measures Outcome: Progressing   Problem: Health Behavior/Discharge Planning: Goal: Ability to manage health-related needs will improve Outcome: Progressing   Problem: Clinical Measurements: Goal: Ability to maintain clinical measurements within normal limits will improve Outcome: Progressing Goal: Will remain free from infection Outcome: Progressing Goal: Diagnostic test results will improve Outcome: Progressing Goal: Respiratory complications will improve Outcome: Progressing Goal: Cardiovascular complication will be avoided Outcome: Progressing   Problem: Activity: Goal: Risk for activity intolerance will decrease Outcome: Progressing   Problem: Nutrition: Goal: Adequate nutrition will be maintained Outcome: Progressing   Problem: Coping: Goal: Level of anxiety will decrease Outcome: Progressing   Problem: Elimination: Goal: Will not experience complications related to bowel motility Outcome: Progressing Goal: Will not experience complications related to urinary retention Outcome: Progressing   Problem: Pain Managment: Goal: General experience of comfort will improve and/or be controlled Outcome: Progressing   Problem: Safety: Goal: Ability to remain free from injury will improve Outcome: Progressing   Problem: Skin Integrity: Goal: Risk for impaired skin integrity will decrease Outcome: Progressing   Problem:  Education: Goal: Knowledge of disease or condition will improve Outcome: Progressing Goal: Knowledge of the prescribed therapeutic regimen will improve Outcome: Progressing   Problem: Activity: Goal: Ability to tolerate increased activity will improve Outcome: Progressing Goal: Will verbalize the importance of balancing activity with adequate rest periods Outcome: Progressing   Problem: Respiratory: Goal: Ability to maintain a clear airway will improve Outcome: Progressing Goal: Levels of oxygenation will improve Outcome: Progressing Goal: Ability to maintain adequate ventilation will improve Outcome: Progressing   Problem: Education: Goal: Knowledge of disease or condition will improve Outcome: Progressing Goal: Knowledge of the prescribed therapeutic regimen will improve Outcome: Progressing   Problem: Activity: Goal: Risk for activity intolerance will decrease Outcome: Progressing   Problem: Cardiac: Goal: Will achieve and/or maintain hemodynamic stability Outcome: Progressing   Problem: Clinical Measurements: Goal: Postoperative complications will be avoided or minimized Outcome: Progressing   Problem: Respiratory: Goal: Respiratory status will improve Outcome: Progressing   Problem: Pain Management: Goal: Pain level will decrease Outcome: Progressing   Problem: Skin Integrity: Goal: Wound healing without signs and symptoms infection will improve Outcome: Progressing

## 2023-03-03 ENCOUNTER — Inpatient Hospital Stay (HOSPITAL_COMMUNITY): Payer: Medicaid Other

## 2023-03-03 LAB — CBC
HCT: 35.1 % — ABNORMAL LOW (ref 39.0–52.0)
Hemoglobin: 10.8 g/dL — ABNORMAL LOW (ref 13.0–17.0)
MCH: 29.6 pg (ref 26.0–34.0)
MCHC: 30.8 g/dL (ref 30.0–36.0)
MCV: 96.2 fL (ref 80.0–100.0)
Platelets: 481 10*3/uL — ABNORMAL HIGH (ref 150–400)
RBC: 3.65 MIL/uL — ABNORMAL LOW (ref 4.22–5.81)
RDW: 17.1 % — ABNORMAL HIGH (ref 11.5–15.5)
WBC: 8.5 10*3/uL (ref 4.0–10.5)
nRBC: 0 % (ref 0.0–0.2)

## 2023-03-03 LAB — BASIC METABOLIC PANEL
Anion gap: 13 (ref 5–15)
BUN: 16 mg/dL (ref 8–23)
CO2: 31 mmol/L (ref 22–32)
Calcium: 10.1 mg/dL (ref 8.9–10.3)
Chloride: 91 mmol/L — ABNORMAL LOW (ref 98–111)
Creatinine, Ser: 0.83 mg/dL (ref 0.61–1.24)
GFR, Estimated: >60 mL/min (ref >60)
Glucose, Bld: 164 mg/dL — ABNORMAL HIGH (ref 70–99)
Potassium: 4.6 mmol/L (ref 3.5–5.1)
Sodium: 135 mmol/L (ref 135–145)

## 2023-03-03 MED ORDER — NUTRISOURCE FIBER PO PACK
1.0000 | PACK | Freq: Two times a day (BID) | ORAL | Status: AC
  Administered 2023-03-03 (×2): 1 via ORAL
  Filled 2023-03-03 (×2): qty 1

## 2023-03-03 MED ORDER — SACCHAROMYCES BOULARDII 250 MG PO CAPS
250.0000 mg | ORAL_CAPSULE | Freq: Two times a day (BID) | ORAL | Status: DC
  Administered 2023-03-03 – 2023-03-07 (×9): 250 mg via ORAL
  Filled 2023-03-03 (×9): qty 1

## 2023-03-03 MED ORDER — LOPERAMIDE HCL 2 MG PO CAPS
4.0000 mg | ORAL_CAPSULE | Freq: Once | ORAL | Status: AC
  Administered 2023-03-03: 4 mg via ORAL
  Filled 2023-03-03: qty 2

## 2023-03-03 NOTE — Progress Notes (Signed)
Physical Therapy Treatment Patient Details Name: Mathew Wyatt MRN: [id number] DOB: [birth year] Today's Date: 03/03/2023   History of Present Illness 63 yo male adm to [facility] 02/07/23 with SOB, DOE, productive cough with Lt empyema. 1/21 pt transferred to [facility] with placement of 2 chest tubes and went into Afib with RVR. 1/30 s/p 7 doses of pleural lytics. 2/5 VATS and decortication. PMH: insomnia, anxiety, CHF, smoker, COPD    PT Comments  Session focused on theract to promote functional mobility. Pt dynamic balance showed functional mobility and balance improvements by walking sideways and backwards with no LOB, equal stride length, and weight shifts without an AD. Pt's SpO2 remained >90% throughout the session without using supplemental O2. Pt continues to have difficulty with activity tolerance and endurance as displayed with increased fatigue after two STS after walking. Pt education provided for increasing activity tolerance, functional mobility, and LE strength using STS when pt returns home. Pt education regarding SPC/AD use in the home and community provided to which pt provided verbal understanding but doesn't believe he would need them much longer and doesn't want PT to recommend additional equipment. Pt is progressing at a favorable pace and has a good chance of returning to baseline soon. Will continue to follow acutely.    If plan is discharge home, recommend the following: Help with stairs or ramp for entrance;Assistance with cooking/housework;Assist for transportation;A little help with walking and/or transfers   Can travel by private vehicle        Equipment Recommendations  None recommended by PT    Recommendations for Other Services       Precautions / Restrictions Precautions Precautions: Fall;Other (comment) (Chest tube) Recall of Precautions/Restrictions: Intact Precaution/Restrictions Comments: Chest tube, watch SPO2 Restrictions Weight Bearing Restrictions Per Provider  Order: No     Mobility  Bed Mobility Overal bed mobility: Modified Independent Bed Mobility: Supine to Sit, Sit to Supine     Supine to sit: Modified independent (Device/Increase time), HOB elevated Sit to supine: Modified independent (Device/Increase time)   General bed mobility comments: Mod I for slight increase in time & HOB elevated    Transfers Overall transfer level: Modified independent Equipment used: Crutches, None Transfers: Sit to/from Stand Sit to Stand: Supervision           General transfer comment: Pt is mod I for higher transfer surfaces. Supervision for lower transfer surfaces. Pt requires increased time and utlizied forward momentum to stand. Pt used crutch for one STS and no AD for two others    Ambulation/Gait Ambulation/Gait assistance: Supervision Gait Distance (Feet): 300 Feet Assistive device: Crutches, None Gait Pattern/deviations: Step-through pattern, Decreased stride length Gait velocity: decreased Gait velocity interpretation: >2.62 ft/sec, indicative of community ambulatory   General Gait Details: Pt ambulated with simulating crutch as a cane for 50 ft in his right UE s/t chest tube on L side. Pt able to ambulate without AD the rest of the session. Pt able to walk sideways and backwards with no LOB, equal stride length, and weight shifts. Walked sideways bil, 8 ft; backwards, 10 ft   Stairs             Wheelchair Mobility     Tilt Bed    Modified Rankin (Stroke Patients Only)       Balance Overall balance assessment: Modified Independent Sitting-balance support: No upper extremity supported, Feet supported Sitting balance-Leahy Scale: Good Sitting balance - Comments: Pt sits EOB wthout LOB or directional lean.   Standing balance support: During  functional activity, No upper extremity supported, Single extremity supported Standing balance-Leahy Scale: Good Standing balance comment: Static standing without UE support. No  LOB.                            Communication Communication Communication: No apparent difficulties  Cognition Arousal: Alert Behavior During Therapy: WFL for tasks assessed/performed   PT - Cognitive impairments: No apparent impairments                         Following commands: Intact      Cueing Cueing Techniques: Verbal cues, Visual cues  Exercises Other Exercises Other Exercises: Squats to bed at lowest height x2    General Comments General comments (skin integrity, edema, etc.): Pt ambulated with SpO2 > 90% on RA. No adverse s/sxs reported or noted.      Pertinent Vitals/Pain Pain Assessment Pain Assessment: No/denies pain Pain Intervention(s): Monitored during session    Home Living                          Prior Function            PT Goals (current goals can now be found in the care plan section) Acute Rehab PT Goals Patient Stated Goal: get stronger and go home PT Goal Formulation: With patient Time For Goal Achievement: 03/14/23 Potential to Achieve Goals: Good Progress towards PT goals: Progressing toward goals    Frequency    Min 1X/week      PT Plan      Co-evaluation              AM-PAC PT "6 Clicks" Mobility   Outcome Measure  Help needed turning from your back to your side while in a flat bed without using bedrails?: None Help needed moving from lying on your back to sitting on the side of a flat bed without using bedrails?: None Help needed moving to and from a bed to a chair (including a wheelchair)?: None Help needed standing up from a chair using your arms (e.g., wheelchair or bedside chair)?: None Help needed to walk in hospital room?: A Little Help needed climbing 3-5 steps with a railing? : A Little 6 Click Score: 22    End of Session Equipment Utilized During Treatment: Gait belt;Oxygen Activity Tolerance: Patient tolerated treatment well Patient left: in bed;with call bell/phone  within reach;with family/visitor present Nurse Communication: Other (comment) (notified of pt stayed >90% SpO2 and off O2 but  was within reach and O2 on 1L) PT Visit Diagnosis: Unsteadiness on feet (R26.81);Muscle weakness (generalized) (M62.81);Difficulty in walking, not elsewhere classified (R26.2)     Time: 4098-1191 PT Time Calculation (min) (ACUTE ONLY): 18 min  Charges:    $Therapeutic Activity: 8-22 mins PT General Charges $$ ACUTE PT VISIT: 1 Visit                    [address], SPT   [place] 03/03/2023, 5:55 PM

## 2023-03-03 NOTE — Progress Notes (Addendum)
      [address]       Mathew Wyatt [address]             [phone]       8 Days Post-Op Procedure(s) (LRB): VIDEO ASSISTED THORACOSCOPY (VATS)/DECORTICATION (Left)  Subjective: Patient sleeping and awakened. He is asking if he has gotten any pain medication recently. I spoke with nurse and he was sleeping so will give later this am.  Objective: Vital signs in last 24 hours: Temp:  [97.6 F (36.4 C)-98.2 F (36.8 C)] 97.6 F (36.4 C) (02/13 0536) Pulse Rate:  [85-104] 91 (02/13 0536) Cardiac Rhythm: Normal sinus rhythm (02/12 1900) Resp:  [14-18] 17 (02/13 0536) BP: (96-106)/(62-73) 104/70 (02/13 0536) SpO2:  [90 %-95 %] 92 % (02/13 0536) Weight:  [68.1 kg] 68.1 kg (02/13 0616)    Intake/Output from previous day: 02/12 0701 - 02/13 0700 In: 1560 [P.O.:1560] Out: 690 [Urine:600; Chest Tube:90]   Physical Exam:  Cardiovascular: RRR Pulmonary: Mildly diminished on left lateral/base Abdomen: Soft, non tender, bowel sounds present. Extremities: No lower extremity edema Wounds: Dressing is clean and dry.   Chest Tube:To water seal. Cough was not forceful this am;tidling but no air leak seen again  Lab Results: CBC: Recent Labs    03/02/23 0612 03/03/23 0631  WBC 9.5 8.5  HGB 10.9* 10.8*  HCT 34.5* 35.1*  PLT 497* 481*   BMET:  Recent Labs    03/01/23 0630  NA 135  K 4.5  CL 99  CO2 29  GLUCOSE 137*  BUN 14  CREATININE 0.89  CALCIUM 9.2     PT/INR: No results for input(s): "LABPROT", "INR" in the last 72 hours. ABG:  INR: Will add last result for INR, ABG once components are confirmed Will add last 4 CBG results once components are confirmed  Assessment/Plan:  1. CV - Previous a fib with RVT. SR this am. On Lopressor 12.5 mg bid, Amiodarone 200 mg daily and Apixaban 5 mg bid. Also, on Spironolactone 12.5 mg daily. Per medicine 2.  Pulmonary - History of COPD-On room air. Chest tube with 90 cc last recorded for last 12 hours.  Chest tube is to water seal. His cough was not forceful this am;tidling but no air leak seen. CXR this am appears stable (persistent left lateral pneumothorax).  ? Clamping trial;Dr. Cliffton Asters to evaluate. Check CXR in am. Encourage incentive spirometer and flutter valve. 3. ID-On Azithromycin and Augmentin for PNA/empyema for Campylobacter and Streptococcus constellatus empyema.   Mathew Mckenny M ZimmermanPA-C 03/03/2023,6:56 AM

## 2023-03-03 NOTE — Progress Notes (Signed)
Mobility Specialist Progress Note;   03/03/23 1505  Mobility  Activity Ambulated with assistance in hallway  Level of Assistance Standby assist, set-up cues, supervision of patient - no hands on  Assistive Device Front wheel walker  Distance Ambulated (ft) 450 ft  Activity Response Tolerated well  Mobility Referral Yes  Mobility visit 1 Mobility  Mobility Specialist Start Time (ACUTE ONLY) 1505  Mobility Specialist Stop Time (ACUTE ONLY) 1530  Mobility Specialist Time Calculation (min) (ACUTE ONLY) 25 min   Pt agreeable to mobility. Required no physical assistance during ambulation. Ambulated on 1LO2 to stay [facility]. No c/o during session. Pt returned back to sitting on EOB with all needs met.   Caesar Bookman Mobility Specialist Please contact via SecureChat or [employer] [phone]

## 2023-03-03 NOTE — Progress Notes (Signed)
PROGRESS NOTE  Mathew Wyatt JYN:[id number] DOB: 1960/03/27 DOA: 02/08/2023 PCP: Patient, No Pcp Per   LOS: 23 days   Brief narrative:  Mathew Wyatt is a 63 y.o. male with past medical history of COPD and a smoker who had initially presented to [facility] hospital on 02/07/2023 with shortness of breath dyspnea on exertion with productive cough.  He was noted to be tachypneic with significant leukocytosis and elevated lactate.  Patient was started on IV antibiotic.  CT scan of the chest however was concerning for empyema versus abscess so he was transferred to [facility] on 02/08/2023 for further evaluation.  Patient however went into A-fib with RVR and hypotension and was admitted to ICU service.   1/21: TCTS defer VATS. ICU for afib rvr hypotension, started on amiodarone. CT placed with immediate 2L purulent output. Initial tube lytics with additional 300cc output. ECHO neg for vegetation, EF 45-50% 1/22: repeat CT chest 1/23 new chest tube placed by IR, cultures growing Campylobacter and Peptostreptococcus 1/26 dec'd pleural output but has persistent loculated fluid collection  1/27 AF w/ RVR. Treated w/ BB. Seen by ID; vanc stopped, switched to ceftriaxone, flagyl and azith for campylobacter. CT chest obtained. The anterior fluid collection has improved. The lateral effusion vol is less but loculated air maintains. Now has Right layering effusion, and new patchy areas of GG changes. Some mild dec in left GG changes. Repeated round 4 pleural lytics (administered in right lateral tube)  1/28: lateral CT 120 net neg  1/30 no significant change after 7 doses of pleural lytic therapy.  -1/30, seen by Dr. Cliffton Asters, patient refused VATS -1/31: Changed his mind, now agrees to surgery, TCTS notified -2/1: PCCM restarted Heparin gtt -2/3, hemoglobin down to 6.0, heparin held, transfusing, T CTS eval pending -2/4, reevaluated by Dr. Cliffton Asters, plan for VATS -2/5 underwent VATS and decortication of left  empyema, Dr. Cliffton Asters -2/8, persistent air leak, chest tube to waterseal, antibiotics changed to PO -2/10 persistent small left base pneumothorax -2/12, IV heparin changed to DOAC  Subjective: -More diarrhea overnight, off-and-on since being on antibiotics  Assessment/Plan:  Sepsis, left empyema, pneumonia Acute hypoxic respiratory failure -Status post chest tube placement x2.  Second chest tube placement by IR 02/10/2023.  -Has been on multiple different combinations of antibiotics -Pleural fluid cultures initially grew Campylobacter and Peptostreptococcus, then repeat cultures with Streptococcus constellatus, pansensitive -CT surgery, pulmonary and ID following.    -Has completed 7 doses of fibrinolytic therapy without considerable improvement, T CTS consulted, seen by Dr. Cliffton Asters 1/30 patient refused VATS/decortication, 1/31 changed his mind -Finally underwent VATS/decortication 2/5 -Now on Augmentin, azithromycin day 23 of ABX -Chest tube management per T CTS  Diarrhea -Likely secondary to antibiotics, clinically do not suspect C. difficile at this time, add fiber, probiotics  Dental caries --Orthopantogram with dental caries, unfortunately no inpatient dental coverage available now, would need outpatient follow-up  New onset atrial fibrillation with RVR.   -Was on amiodarone drip, then changed to p.o., has converted to sinus rhythm, -we were holding heparin with empyema and ongoing fibrinolytic therapy -Now stable, changed to DOAC  Acute on chronic anemia -Hemoglobin trended down to 3 days ago, transfused -Hemoglobin now stable, back on anticoagulation  Acute systolic congestive heart failure with moderately reduced ejection fraction.  2D echocardiogram 45 to 50% with global hypokinesis. 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  Continue  nebulizers.  insomnia anxiety.   -On muscle relaxant Atarax Ambien and pain medication -xanax PRN   DVT prophylaxis: Eliquis disposition: Home pending resolution of air leak    Code Status:    Code Status: Full Code  Family Communication: None at bedside  Consultants: CT surgery PCCM Interventional radiology Cardiology  Procedures: Left-sided chest tube placement x 2. Fibrinolytic treatment x 3  2/5, VATS and decortication  Anti-infectives (From admission, onward)    Start     Dose/Rate Route Frequency Ordered Stop   02/25/23 1200  amoxicillin-clavulanate (AUGMENTIN) 875-125 MG per tablet 1 tablet        1 tablet Oral Every 12 hours 02/25/23 0919     02/24/23 1600  Ampicillin-Sulbactam (UNASYN) 3 g in sodium chloride 0.9 % 100 mL IVPB  Status:  Discontinued        3 g 200 mL/hr over 30 Minutes Intravenous Every 6 hours 02/24/23 0914 02/25/23 0919   02/20/23 1615  metroNIDAZOLE (FLAGYL) tablet 500 mg  Status:  Discontinued        500 mg Oral Every 8 hours 02/20/23 1516 02/24/23 0914   02/20/23 1615  ceFEPIme (MAXIPIME) 2 g in sodium chloride 0.9 % 100 mL IVPB  Status:  Discontinued        2 g 200 mL/hr over 30 Minutes Intravenous Every 8 hours 02/20/23 1519 02/24/23 0914   02/17/23 2000  Ampicillin-Sulbactam (UNASYN) 3 g in sodium chloride 0.9 % 100 mL IVPB  Status:  Discontinued        3 g 200 mL/hr over 30 Minutes Intravenous Every 6 hours 02/17/23 0917 02/20/23 1516   02/14/23 2200  metroNIDAZOLE (FLAGYL) tablet 500 mg  Status:  Discontinued        500 mg Oral Every 12 hours 02/14/23 1412 02/17/23 0917   02/14/23 2000  cefTRIAXone (ROCEPHIN) 2 g in sodium chloride 0.9 % 100 mL IVPB  Status:  Discontinued        2 g 200 mL/hr over 30 Minutes Intravenous Every 24 hours 02/14/23 1410 02/17/23 0917   02/14/23 1500  azithromycin (ZITHROMAX) tablet 500 mg        500 mg Oral Daily 02/14/23 1410     02/12/23 0200  vancomycin (VANCOREADY) IVPB 1500 mg/300 mL  Status:   Discontinued        1,500 mg 150 mL/hr over 120 Minutes Intravenous Every 12 hours 02/11/23 1301 02/14/23 1410   02/11/23 1430  vancomycin (VANCOCIN) 750 mg in sodium chloride 0.9 % 250 mL IVPB  Status:  Discontinued        750 mg 265 mL/hr over 60 Minutes Intravenous  Once 02/11/23 1309 02/11/23 1354   02/11/23 1430  vancomycin (VANCOCIN) 750 mg in sodium chloride 0.9 % 250 mL IVPB        750 mg 250 mL/hr over 60 Minutes Intravenous  Once 02/11/23 1354 02/11/23 1543   02/11/23 1400  vancomycin (VANCOREADY) IVPB 750 mg/150 mL  Status:  Discontinued        750 mg 150 mL/hr over 60 Minutes Intravenous  Once 02/11/23 1301 02/11/23 1309   02/10/23 2130  metroNIDAZOLE (FLAGYL) IVPB 500 mg  Status:  Discontinued        500 mg 100 mL/hr over 60 Minutes Intravenous Every 12 hours 02/10/23 1434 02/14/23 1410   02/08/23 1730  metroNIDAZOLE (FLAGYL) IVPB 500 mg  Status:  Discontinued        500 mg 100 mL/hr over 60 Minutes Intravenous Every 8 hours 02/08/23 1637  02/10/23 1434   02/08/23 0400  vancomycin (VANCOREADY) IVPB 750 mg/150 mL  Status:  Discontinued        750 mg 150 mL/hr over 60 Minutes Intravenous Every 12 hours 02/08/23 0324 02/08/23 0328   02/08/23 0400  ceFEPIme (MAXIPIME) 2 g in sodium chloride 0.9 % 100 mL IVPB  Status:  Discontinued        2 g 200 mL/hr over 30 Minutes Intravenous Every 8 hours 02/08/23 0324 02/14/23 1410   02/08/23 0400  vancomycin (VANCOCIN) 750 mg in sodium chloride 0.9 % 250 mL IVPB  Status:  Discontinued        750 mg 250 mL/hr over 60 Minutes Intravenous Every 12 hours 02/08/23 0328 02/11/23 1258       Objective: Vitals:   03/03/23 0536 03/03/23 0831  BP: 104/70 97/69  Pulse: 91 96  Resp: 17 20  Temp: 97.6 F (36.4 C)   SpO2: 92% 92%    Intake/Output Summary (Last 24 hours) at 03/03/2023 0950 Last data filed at 03/03/2023 0600 Gross per 24 hour  Intake 1440 ml  Output 650 ml  Net 790 ml   Filed Weights   03/01/23 0549 03/02/23 0345 03/03/23  0616  Weight: 70.3 kg 70.3 kg 68.1 kg   Body mass index is 20.37 kg/m.   Physical Exam:  Pleasant chronically ill male sitting up in bed, AAOx3 HEENT: No JVD CVS: S1-S2, regular rhythm Lungs: Decreased breath sounds at the bases, chest tube noted  Abdomen: Soft, nontender, bowel sounds present Extremities: No edema  Data Review: I have personally reviewed the following laboratory data and studies,  CBC: Recent Labs  Lab 02/27/23 0500 02/28/23 0459 03/01/23 0615 03/02/23 0612 03/03/23 0631  WBC 6.0 7.7 8.5 9.5 8.5  HGB 9.8* 10.4* 10.4* 10.9* 10.8*  HCT 31.3* 33.6* 33.3* 34.5* 35.1*  MCV 93.2 94.6 95.7 95.0 96.2  PLT 365 478* 499* 497* 481*   Basic Metabolic Panel: Recent Labs  Lab 02/25/23 0500 02/26/23 0435 03/01/23 0630 03/03/23 0631  NA 148* 137 135 135  K 3.4* 4.1 4.5 4.6  CL 109 94* 99 91*  CO2 26 29 29 31   GLUCOSE 72 116* 137* 164*  BUN 18 8 14 16   CREATININE 1.30* 0.48* 0.89 0.83  CALCIUM 7.2* 9.9 9.2 10.1   Liver Function Tests: Recent Labs  Lab 02/25/23 0500  AST 14*  ALT 9  ALKPHOS 42  BILITOT 0.2  PROT 5.2*  ALBUMIN 1.6*    No results for input(s): "LIPASE", "AMYLASE" in the last 168 hours.  No results for input(s): "AMMONIA" in the last 168 hours. Cardiac Enzymes: No results for input(s): "CKTOTAL", "CKMB", "CKMBINDEX", "TROPONINI" in the last 168 hours. BNP (last 3 results) No results for input(s): "BNP" in the last 8760 hours.  ProBNP (last 3 results) No results for input(s): "PROBNP" in the last 8760 hours.  CBG: No results for input(s): "GLUCAP" in the last 168 hours.  No results found for this or any previous visit (from the past 240 hours).    Studies: DG CHEST PORT 1 VIEW Result Date: 03/02/2023 CLINICAL DATA:  Left-sided pneumothorax EXAM: PORTABLE CHEST 1 VIEW COMPARISON:  03/01/2023 FINDINGS: Left chest tube is again identified and stable. Persistent lateral left pneumothorax is seen but slightly smaller than that  noted on the prior exam. Persistent pleural based opacity is seen. Right lung is clear. IMPRESSION: Slight decrease in left pneumothorax. Electronically Signed   By: Alcide Clever M.D.   On: 03/02/2023 09:46  Zannie Cove, MD  [facility] 03/03/2023  If 7PM-7AM, please contact night-coverage

## 2023-03-03 NOTE — TOC Progression Note (Signed)
Transition of Care [facility]) - Progression Note    Patient Details  Name: Mathew Wyatt MRN: [id number] Date of Birth: [birth year]  Transition of [facility]) CM/SW Contact  Harriet Masson, RN Phone Number: 03/03/2023, 11:49 AM  Clinical Narrative:    Patient continues to have air leak.  TOC following.    Expected Discharge Plan: Home/Self Care Barriers to Discharge: Continued Medical Work up  Expected Discharge Plan and Services   Discharge Planning Services: CM Consult                                           Social Determinants of Health (SDOH) Interventions SDOH Screenings   Food Insecurity: No Food Insecurity (02/12/2023)  Housing: Low Risk  (02/12/2023)  Transportation Needs: No Transportation Needs (02/12/2023)  Utilities: Not At Risk (02/12/2023)  Tobacco Use: High Risk (02/08/2023)    Readmission Risk Interventions     No data to display

## 2023-03-04 ENCOUNTER — Inpatient Hospital Stay (HOSPITAL_COMMUNITY): Payer: Medicaid Other

## 2023-03-04 DIAGNOSIS — F17210 Nicotine dependence, cigarettes, uncomplicated: Secondary | ICD-10-CM

## 2023-03-04 DIAGNOSIS — B955 Unspecified streptococcus as the cause of diseases classified elsewhere: Secondary | ICD-10-CM | POA: Diagnosis not present

## 2023-03-04 DIAGNOSIS — J869 Pyothorax without fistula: Secondary | ICD-10-CM | POA: Diagnosis not present

## 2023-03-04 NOTE — Progress Notes (Addendum)
PROGRESS NOTE  Mahir Prabhakar VZD:[id number] DOB: 20-Jan-1960 DOA: 02/08/2023 PCP: Patient, No Pcp Per   LOS: 24 days   Brief narrative:  Mohsin Crum is a 63 y.o. male with past medical history of COPD and a smoker who had initially presented to [facility] hospital on 02/07/2023 with shortness of breath dyspnea on exertion with productive cough.  He was noted to be tachypneic with significant leukocytosis and elevated lactate.  Patient was started on IV antibiotic.  CT scan of the chest however was concerning for empyema versus abscess so he was transferred to [facility] on 02/08/2023 for further evaluation.  Patient however went into A-fib with RVR and hypotension and was admitted to ICU service.   1/21: TCTS defer VATS. ICU for afib rvr hypotension, started on amiodarone. CT placed with immediate 2L purulent output. Initial tube lytics with additional 300cc output. ECHO neg for vegetation, EF 45-50% 1/22: repeat CT chest 1/23 new chest tube placed by IR, cultures growing Campylobacter and Peptostreptococcus 1/26 dec'd pleural output but has persistent loculated fluid collection  1/27 AF w/ RVR. Treated w/ BB. Seen by ID; vanc stopped, switched to ceftriaxone, flagyl and azith for campylobacter. CT chest obtained. The anterior fluid collection has improved. The lateral effusion vol is less but loculated air maintains. Now has Right layering effusion, and new patchy areas of GG changes. Some mild dec in left GG changes. Repeated round 4 pleural lytics (administered in right lateral tube)  1/28: lateral CT 120 net neg  1/30 no significant change after 7 doses of pleural lytic therapy.  -1/30, seen by Dr. Cliffton Asters, patient refused VATS -1/31: Changed his mind, now agrees to surgery, TCTS notified -2/1: PCCM restarted Heparin gtt -2/3, hemoglobin down to 6.0, heparin held, transfusing, T CTS eval pending -2/4, reevaluated by Dr. Cliffton Asters, plan for VATS -2/5 underwent VATS and decortication of left  empyema, Dr. Cliffton Asters -2/8, persistent air leak, chest tube to waterseal, antibiotics changed to PO -2/10 persistent small left base pneumothorax -2/12, IV heparin changed to DOAC -2/14: clamping CT  Subjective: -feels well, no complaints  Assessment/Plan:  Sepsis, left empyema, pneumonia Acute hypoxic respiratory failure -Status post chest tube placement x2.  Second chest tube placement by IR 02/10/2023.  -Has been on multiple different combinations of antibiotics -Pleural fluid cultures initially grew Campylobacter and Peptostreptococcus, then repeat cultures with Streptococcus constellatus, pansensitive -CT surgery, pulmonary and ID following.    -Has completed 7 doses of fibrinolytic therapy without considerable improvement, T CTS consulted, seen by Dr. Cliffton Asters 1/30 patient refused VATS/decortication, 1/31 changed his mind -Finally underwent VATS/decortication 2/5 -Now on Augmentin,  day 24 of ABX, per ID continue until 3/5, stopped Zithromax -Chest tube management per TCTS, clamping today  Diarrhea -Likely secondary to antibiotics, clinically do not suspect C. difficile at this time, continue fiber, probiotics -improving   Dental caries --Orthopantogram with dental caries, unfortunately no inpatient dental coverage available now, would need outpatient follow-up  New onset atrial fibrillation with RVR.   -Was on amiodarone drip, then changed to p.o., has converted to sinus rhythm, -we were holding heparin with empyema and ongoing fibrinolytic therapy -Now stable, changed to DOAC  Acute on chronic anemia -Hemoglobin trended down to 3 days ago, transfused -Hemoglobin now stable, back on anticoagulation  Acute systolic congestive heart failure with moderately reduced ejection fraction.  2D echocardiogram 45 to 50% with global hypokinesis. 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  Continue nebulizers.   insomnia anxiety.   -On muscle relaxant Atarax Ambien and pain medication -xanax PRN   DVT prophylaxis: Eliquis disposition: Home pending resolution of air leak    Code Status:    Code Status: Full Code  Family Communication: None at bedside  Consultants: CT surgery PCCM Interventional radiology Cardiology  Procedures: Left-sided chest tube placement x 2. Fibrinolytic treatment x 3  2/5, VATS and decortication  Anti-infectives (From admission, onward)    Start     Dose/Rate Route Frequency Ordered Stop   02/25/23 1200  amoxicillin-clavulanate (AUGMENTIN) 875-125 MG per tablet 1 tablet        1 tablet Oral Every 12 hours 02/25/23 0919 03/22/23 2359   02/24/23 1600  Ampicillin-Sulbactam (UNASYN) 3 g in sodium chloride 0.9 % 100 mL IVPB  Status:  Discontinued        3 g 200 mL/hr over 30 Minutes Intravenous Every 6 hours 02/24/23 0914 02/25/23 0919   02/20/23 1615  metroNIDAZOLE (FLAGYL) tablet 500 mg  Status:  Discontinued        500 mg Oral Every 8 hours 02/20/23 1516 02/24/23 0914   02/20/23 1615  ceFEPIme (MAXIPIME) 2 g in sodium chloride 0.9 % 100 mL IVPB  Status:  Discontinued        2 g 200 mL/hr over 30 Minutes Intravenous Every 8 hours 02/20/23 1519 02/24/23 0914   02/17/23 2000  Ampicillin-Sulbactam (UNASYN) 3 g in sodium chloride 0.9 % 100 mL IVPB  Status:  Discontinued        3 g 200 mL/hr over 30 Minutes Intravenous Every 6 hours 02/17/23 0917 02/20/23 1516   02/14/23 2200  metroNIDAZOLE (FLAGYL) tablet 500 mg  Status:  Discontinued        500 mg Oral Every 12 hours 02/14/23 1412 02/17/23 0917   02/14/23 2000  cefTRIAXone (ROCEPHIN) 2 g in sodium chloride 0.9 % 100 mL IVPB  Status:  Discontinued        2 g 200 mL/hr over 30 Minutes Intravenous Every 24 hours 02/14/23 1410 02/17/23 0917   02/14/23 1500  azithromycin (ZITHROMAX) tablet 500 mg  Status:  Discontinued        500 mg Oral Daily 02/14/23 1410  03/04/23 0922   02/12/23 0200  vancomycin (VANCOREADY) IVPB 1500 mg/300 mL  Status:  Discontinued        1,500 mg 150 mL/hr over 120 Minutes Intravenous Every 12 hours 02/11/23 1301 02/14/23 1410   02/11/23 1430  vancomycin (VANCOCIN) 750 mg in sodium chloride 0.9 % 250 mL IVPB  Status:  Discontinued        750 mg 265 mL/hr over 60 Minutes Intravenous  Once 02/11/23 1309 02/11/23 1354   02/11/23 1430  vancomycin (VANCOCIN) 750 mg in sodium chloride 0.9 % 250 mL IVPB        750 mg 250 mL/hr over 60 Minutes Intravenous  Once 02/11/23 1354 02/11/23 1543   02/11/23 1400  vancomycin (VANCOREADY) IVPB 750 mg/150 mL  Status:  Discontinued        750 mg 150 mL/hr over 60 Minutes Intravenous  Once 02/11/23 1301 02/11/23 1309   02/10/23 2130  metroNIDAZOLE (FLAGYL) IVPB 500 mg  Status:  Discontinued        500 mg 100 mL/hr over 60 Minutes Intravenous Every 12 hours 02/10/23 1434 02/14/23 1410   02/08/23 1730  metroNIDAZOLE (FLAGYL) IVPB 500 mg  Status:  Discontinued  500 mg 100 mL/hr over 60 Minutes Intravenous Every 8 hours 02/08/23 1637 02/10/23 1434   02/08/23 0400  vancomycin (VANCOREADY) IVPB 750 mg/150 mL  Status:  Discontinued        750 mg 150 mL/hr over 60 Minutes Intravenous Every 12 hours 02/08/23 0324 02/08/23 0328   02/08/23 0400  ceFEPIme (MAXIPIME) 2 g in sodium chloride 0.9 % 100 mL IVPB  Status:  Discontinued        2 g 200 mL/hr over 30 Minutes Intravenous Every 8 hours 02/08/23 0324 02/14/23 1410   02/08/23 0400  vancomycin (VANCOCIN) 750 mg in sodium chloride 0.9 % 250 mL IVPB  Status:  Discontinued        750 mg 250 mL/hr over 60 Minutes Intravenous Every 12 hours 02/08/23 0328 02/11/23 1258       Objective: Vitals:   03/04/23 0807 03/04/23 1056  BP: 109/65 102/71  Pulse: 81   Resp: 14 15  Temp: 97.7 F (36.5 C) (!) 97.4 F (36.3 C)  SpO2: 93% 93%    Intake/Output Summary (Last 24 hours) at 03/04/2023 1340 Last data filed at 03/04/2023 1212 Gross per 24  hour  Intake 360 ml  Output 40 ml  Net 320 ml   Filed Weights   03/02/23 0345 03/03/23 0616 03/04/23 0520  Weight: 70.3 kg 68.1 kg 68.9 kg   Body mass index is 20.6 kg/m.   Physical Exam:  Chronically ill male sitting up in bed, AAOx3 HEENT: No JVD CVS: S1-S2, regular rhythm  Lungs: Decreased breath sounds at the bases, chest tube noted  Abdomen: Soft, nontender, bowel sounds present Extremities: No edema  Data Review: I have personally reviewed the following laboratory data and studies,  CBC: Recent Labs  Lab 02/27/23 0500 02/28/23 0459 03/01/23 0615 03/02/23 0612 03/03/23 0631  WBC 6.0 7.7 8.5 9.5 8.5  HGB 9.8* 10.4* 10.4* 10.9* 10.8*  HCT 31.3* 33.6* 33.3* 34.5* 35.1*  MCV 93.2 94.6 95.7 95.0 96.2  PLT 365 478* 499* 497* 481*   Basic Metabolic Panel: Recent Labs  Lab 02/26/23 0435 03/01/23 0630 03/03/23 0631  NA 137 135 135  K 4.1 4.5 4.6  CL 94* 99 91*  CO2 29 29 31   GLUCOSE 116* 137* 164*  BUN 8 14 16   CREATININE 0.48* 0.89 0.83  CALCIUM 9.9 9.2 10.1   Liver Function Tests: No results for input(s): "AST", "ALT", "ALKPHOS", "BILITOT", "PROT", "ALBUMIN" in the last 168 hours.   No results for input(s): "LIPASE", "AMYLASE" in the last 168 hours.  No results for input(s): "AMMONIA" in the last 168 hours. Cardiac Enzymes: No results for input(s): "CKTOTAL", "CKMB", "CKMBINDEX", "TROPONINI" in the last 168 hours. BNP (last 3 results) No results for input(s): "BNP" in the last 8760 hours.  ProBNP (last 3 results) No results for input(s): "PROBNP" in the last 8760 hours.  CBG: No results for input(s): "GLUCAP" in the last 168 hours.  No results found for this or any previous visit (from the past 240 hours).    Studies: DG Chest 2 View Result Date: 03/04/2023 CLINICAL DATA:  Pneumothorax on left. EXAM: CHEST - 2 VIEW COMPARISON:  03/03/2023 FINDINGS: Left chest tube is stable with the tip in the left hilar region. Stable appearance of the pleural  air along the lateral left chest. Patchy pleural and parenchymal densities in the left lung are unchanged. Right lung remains clear. Heart and mediastinum are stable. Left arm PICC line terminates in the SVC region. IMPRESSION: 1. Stable appearance  of the left chest tube and persistent left pleural air. 2. Patchy pleural and parenchymal densities in the left lung are unchanged. Electronically Signed   By: Richarda Overlie M.D.   On: 03/04/2023 09:30   DG CHEST PORT 1 VIEW Result Date: 03/03/2023 CLINICAL DATA:  Pneumothorax on left. EXAM: PORTABLE CHEST 1 VIEW COMPARISON:  Radiograph yesterday FINDINGS: Left-sided chest tube remains in place, tip in the mid hemithorax. Similar appearance of small lateral left pneumothorax. Unchanged left lung volume loss, pleural effusion/thickening and ill-defined opacity. Left upper extremity PICC remains in place. No focal right lung opacity. IMPRESSION: 1. Left-sided chest tube in place with unchanged appearance of small left pneumothorax. 2. Stable appearance of volume loss in the left hemithorax, pleural effusion/thickening and ill-defined opacity. Electronically Signed   By: Narda Rutherford M.D.   On: 03/03/2023 10:20       Zannie Cove, MD  [facility] 03/04/2023  If 7PM-7AM, please contact night-coverage

## 2023-03-04 NOTE — Progress Notes (Signed)
      [address]       [place] [address]             [phone]    CXR with increased left pneumothorax after clamping trial. No discomfort or distress, oxygen saturations remained in the 90s. I unclamped the tube, chest tube was left to water seal as discussed with Dr. Cliffton Asters. No rush of air when unclamped but CT still with +air leak with cough.   Jenny Reichmann, PA-C 03/04/23

## 2023-03-04 NOTE — Plan of Care (Signed)
  Problem: Clinical Measurements: Goal: Will remain free from infection Outcome: Progressing Goal: Diagnostic test results will improve Outcome: Progressing Goal: Respiratory complications will improve Outcome: Progressing Goal: Cardiovascular complication will be avoided Outcome: Progressing   Problem: Activity: Goal: Risk for activity intolerance will decrease Outcome: Progressing   Problem: Coping: Goal: Level of anxiety will decrease Outcome: Progressing   Problem: Pain Managment: Goal: General experience of comfort will improve and/or be controlled Outcome: Progressing   Problem: Safety: Goal: Ability to remain free from injury will improve Outcome: Progressing   Problem: Pain Management: Goal: Pain level will decrease Outcome: Progressing

## 2023-03-04 NOTE — Progress Notes (Addendum)
      [address]       [place] [address]             [phone]      9 Days Post-Op Procedure(s) (LRB): VIDEO ASSISTED THORACOSCOPY (VATS)/DECORTICATION (Left) Subjective: Patient sitting up in bed eating breakfast, states he wants to go home. Is frustrated that he is still here.   Objective: Vital signs in last 24 hours: Temp:  [97.6 F (36.4 C)-98.2 F (36.8 C)] 97.7 F (36.5 C) (02/14 0520) Pulse Rate:  [81-98] 86 (02/13 2313) Cardiac Rhythm: Normal sinus rhythm (02/14 0700) Resp:  [14-20] 19 (02/14 0520) BP: (97-118)/(64-82) 100/64 (02/14 0520) SpO2:  [92 %-94 %] 93 % (02/14 0520) Weight:  [68.9 kg] 68.9 kg (02/14 0520)  Hemodynamic parameters for last 24 hours:    Intake/Output from previous day: 02/13 0701 - 02/14 0700 In: 370 [P.O.:360; I.V.:10] Out: 40 [Chest Tube:40] Intake/Output this shift: No intake/output data recorded.  General appearance: alert, cooperative, and no distress Neurologic: intact Heart: regular rate and rhythm, S1, S2 normal, no murmur, click, rub or gallop Lungs: tight breath sounds, diminished at left base Extremities: extremities normal, atraumatic, no cyanosis or edema Wound: Clean and dry dressing in place  Lab Results: Recent Labs    03/02/23 0612 03/03/23 0631  WBC 9.5 8.5  HGB 10.9* 10.8*  HCT 34.5* 35.1*  PLT 497* 481*   BMET:  Recent Labs    03/03/23 0631  NA 135  K 4.6  CL 91*  CO2 31  GLUCOSE 164*  BUN 16  CREATININE 0.83  CALCIUM 10.1    PT/INR: No results for input(s): "LABPROT", "INR" in the last 72 hours. ABG No results found for: "PHART", "HCO3", "TCO2", "ACIDBASEDEF", "O2SAT" CBG (last 3)  No results for input(s): "GLUCAP" in the last 72 hours.  Assessment/Plan: S/P Procedure(s) (LRB): VIDEO ASSISTED THORACOSCOPY (VATS)/DECORTICATION (Left)  CV: Hx of afib with RVR. NSR HR 80s this AM. On Lopressor 12.5mg  BID, Amiodarone 200mg  daily, Eliquis 5mg  BID, and Spironolactone 12.5mg   daily.   Pulm: History of COPD. Saturating well on RA. Tight breath sounds, continue duoneb. CT to water seal. Output 40cc/24hrs. CT with persistent +air leak with cough. CXR with stable small left sided pneumothorax and stable pleural opacity. ?possible clamping trial soon...Dr. Cliffton Asters to evaluate. Encourage IS and ambulation.   ID: PNA/campylobacter and streptococcus constellatus Empyema, on Azithromycin and Augmentin.  Dispo: Care per Medicine team. Chest tube with persistent air leak with cough.   LOS: 24 days    Jenny Reichmann, PA-C 03/04/2023  Agree with above. Clamping trial today.  Kenton Fortin Keane Scrape

## 2023-03-04 NOTE — Progress Notes (Signed)
[facility]    Date of Admission:  02/08/2023      ID: Mathew Wyatt is a 63 y.o. male with  empyema Principal Problem:   Empyema (HCC) Active Problems:   Pressure injury of skin   Atrial fibrillation with rapid ventricular response (HCC)   Acute on chronic diastolic CHF (congestive heart failure) (HCC)   COPD (chronic obstructive pulmonary disease) (HCC)   AKI (acute kidney injury) (HCC)    Subjective: Afebrile. Pneumothorax is improving, but still present on latest xray. Tolerating abtx without difficulty  Medications:   amiodarone  200 mg Oral Daily   amoxicillin-clavulanate  1 tablet Oral Q12H   apixaban  5 mg Oral BID   Chlorhexidine Gluconate Cloth  6 each Topical Q0600   feeding supplement  237 mL Oral BID BM   gabapentin  300 mg Oral TID   Gerhardt's butt cream   Topical TID   lidocaine  5 mL Intradermal Once   melatonin  5 mg Oral QHS   methocarbamol (ROBAXIN) injection  500 mg Intravenous Q8H   Or   methocarbamol  500 mg Oral Q8H   metoprolol tartrate  12.5 mg Oral BID   mupirocin ointment   Nasal BID   nicotine  21 mg Transdermal Daily   pantoprazole  40 mg Oral Daily   saccharomyces boulardii  250 mg Oral BID   sodium chloride flush  10 mL Intrapleural Q8H   sodium chloride flush  10-40 mL Intracatheter Q12H   spironolactone  12.5 mg Oral Daily    Objective: Vital signs in last 24 hours: Temp:  [97.4 F (36.3 C)-98 F (36.7 C)] 97.4 F (36.3 C) (02/14 1056) Pulse Rate:  [81-98] 81 (02/14 0807) Resp:  [14-20] 15 (02/14 1056) BP: (97-118)/(64-82) 102/71 (02/14 1056) SpO2:  [92 %-94 %] 93 % (02/14 1056) Weight:  [68.9 kg] 68.9 kg (02/14 0520) Physical Exam  Constitutional: He is oriented to person, place, and time. He appears well-developed and well-nourished. No distress.  HENT:  Mouth/Throat: Oropharynx is clear and moist. No oropharyngeal exudate.  Cardiovascular: Normal rate, regular rhythm and normal heart sounds.  Exam reveals no gallop and no friction rub.  No murmur heard.  Pulmonary/Chest: Effort normal and breath sounds normal. No respiratory distress. He has no wheezes. Chest tube in place Abdominal: Soft. Bowel sounds are normal. He exhibits no distension. There is no tenderness.  Lymphadenopathy:  He has no cervical adenopathy.  Neurological: He is alert and oriented to person, place, and time.  Skin: Skin is warm and dry. No rash noted. No erythema.  Psychiatric: He has a normal mood and affect. His behavior is normal.    Lab Results Recent Labs    03/02/23 0612 03/03/23 0631  WBC 9.5 8.5  HGB 10.9* 10.8*  HCT 34.5* 35.1*  NA  --  135  K  --  4.6  CL  --  91*  CO2  --  31  BUN  --  16  CREATININE  --  0.83   Liver Panel No results for input(s): "PROT", "ALBUMIN", "AST", "ALT", "ALKPHOS", "BILITOT", "BILIDIR", "IBILI" in the last 72 hours. Sedimentation Rate No results for input(s): "ESRSEDRATE" in the last 72 hours. C-Reactive Protein No results for input(s): "CRP" in the last 72 hours.  Microbiology:  Studies/Results: DG Chest 2 View Result Date: 03/04/2023 CLINICAL DATA:  Pneumothorax on left. EXAM: CHEST - 2 VIEW COMPARISON:  03/03/2023 FINDINGS: Left chest tube is stable with the  tip in the left hilar region. Stable appearance of the pleural air along the lateral left chest. Patchy pleural and parenchymal densities in the left lung are unchanged. Right lung remains clear. Heart and mediastinum are stable. Left arm PICC line terminates in the SVC region. IMPRESSION: 1. Stable appearance of the left chest tube and persistent left pleural air. 2. Patchy pleural and parenchymal densities in the left lung are unchanged. Electronically Signed   By: Richarda Overlie M.D.   On: 03/04/2023 09:30   DG CHEST PORT 1 VIEW Result Date: 03/03/2023 CLINICAL DATA:  Pneumothorax on left. EXAM: PORTABLE CHEST 1 VIEW COMPARISON:  Radiograph yesterday FINDINGS: Left-sided chest tube remains in place,  tip in the mid hemithorax. Similar appearance of small lateral left pneumothorax. Unchanged left lung volume loss, pleural effusion/thickening and ill-defined opacity. Left upper extremity PICC remains in place. No focal right lung opacity. IMPRESSION: 1. Left-sided chest tube in place with unchanged appearance of small left pneumothorax. 2. Stable appearance of volume loss in the left hemithorax, pleural effusion/thickening and ill-defined opacity. Electronically Signed   By: Narda Rutherford M.D.   On: 03/03/2023 10:20     Assessment/Plan: Streptococcal empyema = will do 4 wk from 2/5. Continue with amox/clav 875mg  bid until march 5th. Can discontinue azithromycin for campylobacter since he has finished 10 day course, and likely still has long half life.  Pneumothorax= defer management of chest tube as PTX resolves to primary team.  We will see back in ID clinic in early march to see if further abtx are needed.   Will sign off  [facility] Pager: [phone]  03/04/2023, 1:17 PM

## 2023-03-04 NOTE — Plan of Care (Signed)
  Problem: Clinical Measurements: Goal: Cardiovascular complication will be avoided Outcome: Progressing   Problem: Activity: Goal: Risk for activity intolerance will decrease Outcome: Progressing   Problem: Coping: Goal: Level of anxiety will decrease Outcome: Progressing   Problem: Pain Managment: Goal: General experience of comfort will improve and/or be controlled Outcome: Progressing   Problem: Safety: Goal: Ability to remain free from injury will improve Outcome: Progressing

## 2023-03-04 NOTE — Progress Notes (Signed)
Mobility Specialist Progress Note:   03/04/23 1100  Oxygen Therapy  O2 Device Room Air  Mobility  Activity Ambulated with assistance in hallway  Level of Assistance Standby assist, set-up cues, supervision of patient - no hands on  Assistive Device Front wheel walker  Distance Ambulated (ft) 60 ft  Activity Response Tolerated well  Mobility Referral Yes  Mobility visit 1 Mobility  Mobility Specialist Start Time (ACUTE ONLY) 0856  Mobility Specialist Stop Time (ACUTE ONLY) 0909  Mobility Specialist Time Calculation (min) (ACUTE ONLY) 13 min    Pre Mobility: 86 HR,  93% SpO2 Post Mobility:  90 HR,  92% SpO2  Pt received in bed and agreeable w/ encouragement. C/o being too early and lack of sleep, distance limited d/t this. No other complaints throughout and returned to room w/o fault. Pt left in bed with call bell and all needs met.  D'Vante Earlene Plater Mobility Specialist Please contact via Special educational needs teacher or Rehab office at [phone]

## 2023-03-05 ENCOUNTER — Inpatient Hospital Stay (HOSPITAL_COMMUNITY): Payer: Medicaid Other

## 2023-03-05 MED ORDER — HYDROMORPHONE HCL 1 MG/ML IJ SOLN
1.0000 mg | INTRAMUSCULAR | Status: DC | PRN
  Administered 2023-03-05 – 2023-03-07 (×12): 1 mg via INTRAVENOUS
  Filled 2023-03-05 (×12): qty 1

## 2023-03-05 NOTE — Progress Notes (Signed)
 PROGRESS NOTE  Mathew Wyatt ZOX:[id number] DOB: 03-13-1960 DOA: 02/08/2023 PCP: Patient, No Pcp Per   LOS: 25 days   Brief narrative:  Mathew Wyatt is a 63 y.o. male with past medical history of COPD and a smoker who had initially presented to [facility] hospital on 02/07/2023 with shortness of breath dyspnea on exertion with productive cough.  He was noted to be tachypneic with significant leukocytosis and elevated lactate.  Patient was started on IV antibiotic.  CT scan of the chest however was concerning for empyema versus abscess so he was transferred to [facility] on 02/08/2023 for further evaluation.  Patient however went into A-fib with RVR and hypotension and was admitted to ICU service.   1/21: TCTS defer VATS. ICU for afib rvr hypotension, started on amiodarone. CT placed with immediate 2L purulent output. Initial tube lytics with additional 300cc output. ECHO neg for vegetation, EF 45-50% 1/22: repeat CT chest 1/23 new chest tube placed by IR, cultures growing Campylobacter and Peptostreptococcus 1/26 dec'd pleural output but has persistent loculated fluid collection  1/27 AF w/ RVR. Treated w/ BB. Seen by ID; vanc stopped, switched to ceftriaxone, flagyl and azith for campylobacter. CT chest obtained. The anterior fluid collection has improved. The lateral effusion vol is less but loculated air maintains. Now has Right layering effusion, and new patchy areas of GG changes. Some mild dec in left GG changes. Repeated round 4 pleural lytics (administered in right lateral tube)  1/28: lateral CT 120 net neg  1/30 no significant change after 7 doses of pleural lytic therapy.  -1/30, seen by Dr. Cliffton Asters, patient refused VATS -1/31: Changed his mind, now agrees to surgery, TCTS notified -2/1: PCCM restarted Heparin gtt -2/3, hemoglobin down to 6.0, heparin held, transfusing, T CTS eval pending -2/4, reevaluated by Dr. Cliffton Asters, plan for VATS -2/5 underwent VATS and decortication of left  empyema, Dr. Cliffton Asters -2/8, persistent air leak, chest tube to waterseal, antibiotics changed to PO -2/10 persistent small left base pneumothorax -2/12, IV heparin changed to DOAC -2/14: clamping CT> clamped for persistent air leak -2/15: Repeat clamping trial today  Subjective: -Feels well, no complaints, wants to go home, diarrhea is better  Assessment/Plan:  Sepsis, left empyema, pneumonia Acute hypoxic respiratory failure -Status post chest tube placement x2.  Second chest tube placement by IR 02/10/2023.  -Has been on multiple different combinations of antibiotics -Pleural fluid cultures initially grew Campylobacter and Peptostreptococcus, then repeat cultures with Streptococcus constellatus, pansensitive -CT surgery, pulmonary and ID following.    -Has completed 7 doses of fibrinolytic therapy without considerable improvement, T CTS consulted, seen by Dr. Cliffton Asters 1/30 patient refused VATS/decortication, 1/31 changed his mind -Finally underwent VATS/decortication 2/5 -Now on Augmentin,  day 25 of ABX, per ID continue until 3/5, stopped Zithromax -Chest tube management per TCTS, second attempted clamping tube today  Diarrhea -Likely secondary to antibiotics, clinically do not suspect C. difficile at this time, continue fiber, probiotics -improving   Dental caries --Orthopantogram with dental caries, unfortunately no inpatient dental coverage available now, would need outpatient follow-up  New onset atrial fibrillation with RVR.   -Was on amiodarone drip, then changed to p.o., has converted to sinus rhythm, -Finally resumed anticoagulation -Now stable, changed to DOAC  Acute on chronic anemia -Hemoglobin trended down last week, transfused -Hemoglobin now stable, back on anticoagulation  Acute systolic congestive heart failure with moderately reduced ejection fraction.  2D echocardiogram 45 to 50% with global hypokinesis. 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  Continue nebulizers.   insomnia anxiety.   -On muscle relaxant Atarax Ambien and pain medication -xanax PRN   DVT prophylaxis: Eliquis disposition: Home pending resolution of air leak    Code Status:    Code Status: Full Code  Family Communication: None at bedside  Consultants: CT surgery PCCM Interventional radiology Cardiology  Procedures: Left-sided chest tube placement x 2. Fibrinolytic treatment x 3  2/5, VATS and decortication  Anti-infectives (From admission, onward)    Start     Dose/Rate Route Frequency Ordered Stop   02/25/23 1200  amoxicillin-clavulanate (AUGMENTIN) 875-125 MG per tablet 1 tablet        1 tablet Oral Every 12 hours 02/25/23 0919 03/22/23 2359   02/24/23 1600  Ampicillin-Sulbactam (UNASYN) 3 g in sodium chloride 0.9 % 100 mL IVPB  Status:  Discontinued        3 g 200 mL/hr over 30 Minutes Intravenous Every 6 hours 02/24/23 0914 02/25/23 0919   02/20/23 1615  metroNIDAZOLE (FLAGYL) tablet 500 mg  Status:  Discontinued        500 mg Oral Every 8 hours 02/20/23 1516 02/24/23 0914   02/20/23 1615  ceFEPIme (MAXIPIME) 2 g in sodium chloride 0.9 % 100 mL IVPB  Status:  Discontinued        2 g 200 mL/hr over 30 Minutes Intravenous Every 8 hours 02/20/23 1519 02/24/23 0914   02/17/23 2000  Ampicillin-Sulbactam (UNASYN) 3 g in sodium chloride 0.9 % 100 mL IVPB  Status:  Discontinued        3 g 200 mL/hr over 30 Minutes Intravenous Every 6 hours 02/17/23 0917 02/20/23 1516   02/14/23 2200  metroNIDAZOLE (FLAGYL) tablet 500 mg  Status:  Discontinued        500 mg Oral Every 12 hours 02/14/23 1412 02/17/23 0917   02/14/23 2000  cefTRIAXone (ROCEPHIN) 2 g in sodium chloride 0.9 % 100 mL IVPB  Status:  Discontinued        2 g 200 mL/hr over 30 Minutes Intravenous Every 24 hours 02/14/23 1410 02/17/23 0917   02/14/23 1500   azithromycin (ZITHROMAX) tablet 500 mg  Status:  Discontinued        500 mg Oral Daily 02/14/23 1410 03/04/23 0922   02/12/23 0200  vancomycin (VANCOREADY) IVPB 1500 mg/300 mL  Status:  Discontinued        1,500 mg 150 mL/hr over 120 Minutes Intravenous Every 12 hours 02/11/23 1301 02/14/23 1410   02/11/23 1430  vancomycin (VANCOCIN) 750 mg in sodium chloride 0.9 % 250 mL IVPB  Status:  Discontinued        750 mg 265 mL/hr over 60 Minutes Intravenous  Once 02/11/23 1309 02/11/23 1354   02/11/23 1430  vancomycin (VANCOCIN) 750 mg in sodium chloride 0.9 % 250 mL IVPB        750 mg 250 mL/hr over 60 Minutes Intravenous  Once 02/11/23 1354 02/11/23 1543   02/11/23 1400  vancomycin (VANCOREADY) IVPB 750 mg/150 mL  Status:  Discontinued        750 mg 150 mL/hr over 60 Minutes Intravenous  Once 02/11/23 1301 02/11/23 1309   02/10/23 2130  metroNIDAZOLE (FLAGYL) IVPB 500 mg  Status:  Discontinued        500 mg 100 mL/hr over 60 Minutes Intravenous Every 12 hours 02/10/23 1434 02/14/23 1410   02/08/23 1730  metroNIDAZOLE (FLAGYL) IVPB  500 mg  Status:  Discontinued        500 mg 100 mL/hr over 60 Minutes Intravenous Every 8 hours 02/08/23 1637 02/10/23 1434   02/08/23 0400  vancomycin (VANCOREADY) IVPB 750 mg/150 mL  Status:  Discontinued        750 mg 150 mL/hr over 60 Minutes Intravenous Every 12 hours 02/08/23 0324 02/08/23 0328   02/08/23 0400  ceFEPIme (MAXIPIME) 2 g in sodium chloride 0.9 % 100 mL IVPB  Status:  Discontinued        2 g 200 mL/hr over 30 Minutes Intravenous Every 8 hours 02/08/23 0324 02/14/23 1410   02/08/23 0400  vancomycin (VANCOCIN) 750 mg in sodium chloride 0.9 % 250 mL IVPB  Status:  Discontinued        750 mg 250 mL/hr over 60 Minutes Intravenous Every 12 hours 02/08/23 0328 02/11/23 1258       Objective: Vitals:   03/05/23 0500 03/05/23 0747  BP:  102/78  Pulse: 77 99  Resp: 15 19  Temp:  97.6 F (36.4 C)  SpO2: 92% 91%    Intake/Output Summary (Last 24  hours) at 03/05/2023 1032 Last data filed at 03/05/2023 0800 Gross per 24 hour  Intake 730 ml  Output 605 ml  Net 125 ml   Filed Weights   03/03/23 0616 03/04/23 0520 03/05/23 0500  Weight: 68.1 kg 68.9 kg 70.3 kg   Body mass index is 21.02 kg/m.   Physical Exam:  Chronically ill male sitting up in bed, AAOx3 HEENT: No JVD CVS: S1-S2, regular rhythm  Lungs: Decreased breath sounds at the bases, chest tube noted  Abdomen: Soft, nontender, bowel sounds present Extremities: No edema  Data Review: I have personally reviewed the following laboratory data and studies,  CBC: Recent Labs  Lab 02/27/23 0500 02/28/23 0459 03/01/23 0615 03/02/23 0612 03/03/23 0631  WBC 6.0 7.7 8.5 9.5 8.5  HGB 9.8* 10.4* 10.4* 10.9* 10.8*  HCT 31.3* 33.6* 33.3* 34.5* 35.1*  MCV 93.2 94.6 95.7 95.0 96.2  PLT 365 478* 499* 497* 481*   Basic Metabolic Panel: Recent Labs  Lab 03/01/23 0630 03/03/23 0631  NA 135 135  K 4.5 4.6  CL 99 91*  CO2 29 31  GLUCOSE 137* 164*  BUN 14 16  CREATININE 0.89 0.83  CALCIUM 9.2 10.1   Liver Function Tests: No results for input(s): "AST", "ALT", "ALKPHOS", "BILITOT", "PROT", "ALBUMIN" in the last 168 hours.   No results for input(s): "LIPASE", "AMYLASE" in the last 168 hours.  No results for input(s): "AMMONIA" in the last 168 hours. Cardiac Enzymes: No results for input(s): "CKTOTAL", "CKMB", "CKMBINDEX", "TROPONINI" in the last 168 hours. BNP (last 3 results) No results for input(s): "BNP" in the last 8760 hours.  ProBNP (last 3 results) No results for input(s): "PROBNP" in the last 8760 hours.  CBG: No results for input(s): "GLUCAP" in the last 168 hours.  No results found for this or any previous visit (from the past 240 hours).    Studies: DG CHEST PORT 1 VIEW Result Date: 03/05/2023 CLINICAL DATA:  Follow-up pneumothorax EXAM: PORTABLE CHEST 1 VIEW COMPARISON:  03/04/2023 FINDINGS: Left arm PICC line tip is in the distal SVC. There is a  left-sided chest tube which is stable in position. Loculated left-sided hydropneumothorax is again seen and is unchanged compared with the previous exam. Overlying opacities may represent airspace disease and or atelectasis. Right lung is clear. Stable cardiomediastinal contours. IMPRESSION: 1. Stable loculated left-sided hydropneumothorax. 2.  Stable left-sided chest tube. 3. Stable overlying opacities. Electronically Signed   By: Signa Kell M.D.   On: 03/05/2023 09:41   DG CHEST PORT 1 VIEW Result Date: 03/04/2023 CLINICAL DATA:  [id number] Pneumothorax on left [id number] EXAM: PORTABLE CHEST 1 VIEW COMPARISON:  Same-day x-ray FINDINGS: Left-sided pneumothorax at the lateral left chest is stable to minimally increased in size compared to the previous study. Left chest tube remains in place. Similar left basilar opacity. Right lung remains clear. Stable left PICC line. IMPRESSION: Left-sided pneumothorax is stable to minimally increased in size compared to the previous study. Left chest tube remains in place. Electronically Signed   By: Duanne Guess D.O.   On: 03/04/2023 16:53   DG Chest 2 View Result Date: 03/04/2023 CLINICAL DATA:  Pneumothorax on left. EXAM: CHEST - 2 VIEW COMPARISON:  03/03/2023 FINDINGS: Left chest tube is stable with the tip in the left hilar region. Stable appearance of the pleural air along the lateral left chest. Patchy pleural and parenchymal densities in the left lung are unchanged. Right lung remains clear. Heart and mediastinum are stable. Left arm PICC line terminates in the SVC region. IMPRESSION: 1. Stable appearance of the left chest tube and persistent left pleural air. 2. Patchy pleural and parenchymal densities in the left lung are unchanged. Electronically Signed   By: Richarda Overlie M.D.   On: 03/04/2023 09:30       Zannie Cove, MD  [facility] 03/05/2023  If 7PM-7AM, please contact night-coverage

## 2023-03-05 NOTE — Plan of Care (Signed)
   Problem: Fluid Volume: Goal: Hemodynamic stability will improve Outcome: Progressing   Problem: Clinical Measurements: Goal: Diagnostic test results will improve Outcome: Progressing

## 2023-03-05 NOTE — Progress Notes (Addendum)
      [address]       [place] [address]             [phone]      10 Days Post-Op Procedure(s) (LRB): VIDEO ASSISTED THORACOSCOPY (VATS)/DECORTICATION (Left) Subjective: No new complaints, patient states he was able to ambulate yesterday without the walker. Denied shortness of breath. Eager to go home.   Objective: Vital signs in last 24 hours: Temp:  [97.4 F (36.3 C)-98.4 F (36.9 C)] 97.6 F (36.4 C) (02/15 0747) Pulse Rate:  [77-99] 99 (02/15 0747) Cardiac Rhythm: Normal sinus rhythm (02/14 1900) Resp:  [14-20] 19 (02/15 0747) BP: (91-110)/(63-78) 102/78 (02/15 0747) SpO2:  [91 %-94 %] 91 % (02/15 0747) Weight:  [70.3 kg] 70.3 kg (02/15 0500)  Hemodynamic parameters for last 24 hours:    Intake/Output from previous day: 02/14 0701 - 02/15 0700 In: 490 [P.O.:480; I.V.:10] Out: 40 [Chest Tube:40] Intake/Output this shift: Total I/O In: 240 [P.O.:240] Out: 565 [Urine:550; Chest Tube:15]  General appearance: alert, cooperative, and no distress Neurologic: intact Heart: regular rate and rhythm, S1, S2 normal, no murmur, click, rub or gallop Lungs: Coarse left basilar lung sounds, air leak with cough Wound: Clean and dry dressing in place  Lab Results: Recent Labs    03/03/23 0631  WBC 8.5  HGB 10.8*  HCT 35.1*  PLT 481*   BMET:  Recent Labs    03/03/23 0631  NA 135  K 4.6  CL 91*  CO2 31  GLUCOSE 164*  BUN 16  CREATININE 0.83  CALCIUM 10.1    PT/INR: No results for input(s): "LABPROT", "INR" in the last 72 hours. ABG No results found for: "PHART", "HCO3", "TCO2", "ACIDBASEDEF", "O2SAT" CBG (last 3)  No results for input(s): "GLUCAP" in the last 72 hours.  Assessment/Plan: S/P Procedure(s) (LRB): VIDEO ASSISTED THORACOSCOPY (VATS)/DECORTICATION (Left)  CV: Hx of afib with RVR. NSR HR 90s this AM. On Lopressor 12.5mg  BID, Amiodarone 200mg  daily, Eliquis 5mg  BID, and Spironolactone 12.5mg  daily per primary team.     Pulm: History of COPD. Saturating 90% on RA this AM. CT to water seal. +Air leak with more aggressive cough. Clamping trial yesterday, patient without distress or drop in O2 saturations. CXR showed stable to minimally increased left sided pneumothorax, CXR this AM remains stable. CT Output 40cc/24hrs. Per Dr. Dorris Fetch will do clamping retrial today and get follow up CXR in the AM.     ID: PNA/campylobacter and streptococcus constellatus Empyema, on Augmentin.   Dispo: Care per Medicine team.      LOS: 25 days    Jenny Reichmann, PA-C 03/05/2023 Patient seen and examined, agree with above He still has an air leak.  Had a clamping trial yesterday which showed an increase in the basilar pneumo, and tube was placed back to water seal. Will clamp tube again today and leave for 24 hours- if CXR stable tomorrow will dc chest tube  Viviann Spare C. Dorris Fetch, MD [facility] [phone]

## 2023-03-06 ENCOUNTER — Inpatient Hospital Stay (HOSPITAL_COMMUNITY): Payer: Medicaid Other

## 2023-03-06 LAB — CBC
HCT: 34.9 % — ABNORMAL LOW (ref 39.0–52.0)
Hemoglobin: 10.9 g/dL — ABNORMAL LOW (ref 13.0–17.0)
MCH: 29.9 pg (ref 26.0–34.0)
MCHC: 31.2 g/dL (ref 30.0–36.0)
MCV: 95.6 fL (ref 80.0–100.0)
Platelets: 417 10*3/uL — ABNORMAL HIGH (ref 150–400)
RBC: 3.65 MIL/uL — ABNORMAL LOW (ref 4.22–5.81)
RDW: 17.8 % — ABNORMAL HIGH (ref 11.5–15.5)
WBC: 9.2 10*3/uL (ref 4.0–10.5)
nRBC: 0 % (ref 0.0–0.2)

## 2023-03-06 LAB — BASIC METABOLIC PANEL
Anion gap: 10 (ref 5–15)
BUN: 18 mg/dL (ref 8–23)
CO2: 30 mmol/L (ref 22–32)
Calcium: 9.7 mg/dL (ref 8.9–10.3)
Chloride: 98 mmol/L (ref 98–111)
Creatinine, Ser: 0.72 mg/dL (ref 0.61–1.24)
GFR, Estimated: >60 mL/min (ref >60)
Glucose, Bld: 110 mg/dL — ABNORMAL HIGH (ref 70–99)
Potassium: 3.7 mmol/L (ref 3.5–5.1)
Sodium: 138 mmol/L (ref 135–145)

## 2023-03-06 NOTE — Plan of Care (Signed)
  Problem: Fluid Volume: Goal: Hemodynamic stability will improve Outcome: Progressing   Problem: Clinical Measurements: Goal: Diagnostic test results will improve Outcome: Progressing Goal: Signs and symptoms of infection will decrease Outcome: Progressing   Problem: Respiratory: Goal: Ability to maintain adequate ventilation will improve Outcome: Progressing   Problem: Education: Goal: Knowledge of General Education information will improve Description: Including pain rating scale, medication(s)/side effects and non-pharmacologic comfort measures Outcome: Progressing   Problem: Health Behavior/Discharge Planning: Goal: Ability to manage health-related needs will improve Outcome: Progressing   Problem: Clinical Measurements: Goal: Ability to maintain clinical measurements within normal limits will improve Outcome: Progressing Goal: Will remain free from infection Outcome: Progressing Goal: Diagnostic test results will improve Outcome: Progressing Goal: Respiratory complications will improve Outcome: Progressing Goal: Cardiovascular complication will be avoided Outcome: Progressing   Problem: Activity: Goal: Risk for activity intolerance will decrease Outcome: Progressing   Problem: Nutrition: Goal: Adequate nutrition will be maintained Outcome: Progressing   Problem: Coping: Goal: Level of anxiety will decrease Outcome: Progressing   Problem: Elimination: Goal: Will not experience complications related to bowel motility Outcome: Progressing Goal: Will not experience complications related to urinary retention Outcome: Progressing   Problem: Pain Managment: Goal: General experience of comfort will improve and/or be controlled Outcome: Progressing   Problem: Safety: Goal: Ability to remain free from injury will improve Outcome: Progressing   Problem: Skin Integrity: Goal: Risk for impaired skin integrity will decrease Outcome: Progressing   Problem:  Education: Goal: Knowledge of disease or condition will improve Outcome: Progressing Goal: Knowledge of the prescribed therapeutic regimen will improve Outcome: Progressing   Problem: Activity: Goal: Ability to tolerate increased activity will improve Outcome: Progressing Goal: Will verbalize the importance of balancing activity with adequate rest periods Outcome: Progressing   Problem: Respiratory: Goal: Ability to maintain a clear airway will improve Outcome: Progressing Goal: Levels of oxygenation will improve Outcome: Progressing Goal: Ability to maintain adequate ventilation will improve Outcome: Progressing   Problem: Activity: Goal: Risk for activity intolerance will decrease Outcome: Progressing   Problem: Cardiac: Goal: Will achieve and/or maintain hemodynamic stability Outcome: Progressing   Problem: Clinical Measurements: Goal: Postoperative complications will be avoided or minimized Outcome: Progressing

## 2023-03-06 NOTE — Progress Notes (Addendum)
      [address]       [place] [address]             [phone]      11 Days Post-Op Procedure(s) (LRB): VIDEO ASSISTED THORACOSCOPY (VATS)/DECORTICATION (Left) Subjective: Patient states he wants to have some good news. Denies pain or SOB.   Objective: Vital signs in last 24 hours: Temp:  [97.9 F (36.6 C)-98.3 F (36.8 C)] 98.3 F (36.8 C) (02/16 0709) Pulse Rate:  [77-90] 85 (02/16 0709) Cardiac Rhythm: Normal sinus rhythm (02/15 1905) Resp:  [12-20] 19 (02/16 0709) BP: (93-121)/(63-71) 98/70 (02/16 0709) SpO2:  [91 %-94 %] 91 % (02/16 0709)  Hemodynamic parameters for last 24 hours:    Intake/Output from previous day: 02/15 0701 - 02/16 0700 In: 480 [P.O.:480] Out: 1065 [Urine:1050; Chest Tube:15] Intake/Output this shift: No intake/output data recorded.  General appearance: alert, cooperative, and no distress Neurologic: intact Heart: regular rate and rhythm, S1, S2 normal, no murmur, click, rub or gallop Lungs: tight breath sounds  Lab Results: Recent Labs    03/06/23 0538  WBC 9.2  HGB 10.9*  HCT 34.9*  PLT 417*   BMET:  Recent Labs    03/06/23 0538  NA 138  K 3.7  CL 98  CO2 30  GLUCOSE 110*  BUN 18  CREATININE 0.72  CALCIUM 9.7    PT/INR: No results for input(s): "LABPROT", "INR" in the last 72 hours. ABG No results found for: "PHART", "HCO3", "TCO2", "ACIDBASEDEF", "O2SAT" CBG (last 3)  No results for input(s): "GLUCAP" in the last 72 hours.  Assessment/Plan: S/P Procedure(s) (LRB): VIDEO ASSISTED THORACOSCOPY (VATS)/DECORTICATION (Left)  CV: Hx of afib with RVR. NSR HR 90s this AM. On Lopressor 12.5mg  BID, Amiodarone 200mg  daily, Eliquis 5mg  BID, and Spironolactone 12.5mg  daily per primary team.    Pulm: History of COPD. Saturating 90% on RA this AM. Clamping trial last 24hours, patient without distress or drop in O2 saturations. CXR shows a grossly stable loculated hydronpneumothroax. Will d/c CT as discussed  with Dr. Dorris Fetch. Encourage IS and ambulation. Continue nebs.   ID: PNA/campylobacter and streptococcus constellatus Empyema, on Augmentin.   Dispo: Care per Medicine team.    LOS: 26 days    Jenny Reichmann, PA-C 03/06/2023 Patient seen and examined with Mrs. Stehler earlier today. Chest removed, CXR stable space lateral left  Nycholas Rayner C. Dorris Fetch, MD [facility] [phone]

## 2023-03-06 NOTE — Plan of Care (Incomplete)
  Problem: Fluid Volume: Goal: Hemodynamic stability will improve Outcome: Progressing   Problem: Clinical Measurements: Goal: Diagnostic test results will improve Outcome: Progressing Goal: Signs and symptoms of infection will decrease Outcome: Progressing   Problem: Respiratory: Goal: Ability to maintain adequate ventilation will improve Outcome: Progressing   Problem: Education: Goal: Knowledge of General Education information will improve Description: Including pain rating scale, medication(s)/side effects and non-pharmacologic comfort measures Outcome: Progressing   Problem: Health Behavior/Discharge Planning: Goal: Ability to manage health-related needs will improve Outcome: Progressing   Problem: Clinical Measurements: Goal: Ability to maintain clinical measurements within normal limits will improve Outcome: Progressing Goal: Will remain free from infection Outcome: Progressing Goal: Diagnostic test results will improve Outcome: Progressing Goal: Respiratory complications will improve Outcome: Progressing Goal: Cardiovascular complication will be avoided Outcome: Progressing   Problem: Activity: Goal: Risk for activity intolerance will decrease Outcome: Progressing   Problem: Nutrition: Goal: Adequate nutrition will be maintained Outcome: Progressing   Problem: Coping: Goal: Level of anxiety will decrease Outcome: Progressing   Problem: Elimination: Goal: Will not experience complications related to bowel motility Outcome: Progressing Goal: Will not experience complications related to urinary retention Outcome: Progressing   Problem: Pain Managment: Goal: General experience of comfort will improve and/or be controlled Outcome: Progressing   Problem: Safety: Goal: Ability to remain free from injury will improve Outcome: Progressing   Problem: Skin Integrity: Goal: Risk for impaired skin integrity will decrease Outcome: Progressing   Problem:  Education: Goal: Knowledge of disease or condition will improve Outcome: Progressing Goal: Knowledge of the prescribed therapeutic regimen will improve Outcome: Progressing   Problem: Activity: Goal: Ability to tolerate increased activity will improve Outcome: Progressing Goal: Will verbalize the importance of balancing activity with adequate rest periods Outcome: Progressing   Problem: Respiratory: Goal: Ability to maintain a clear airway will improve Outcome: Progressing Goal: Levels of oxygenation will improve Outcome: Progressing Goal: Ability to maintain adequate ventilation will improve Outcome: Progressing   Problem: Education: Goal: Knowledge of disease or condition will improve Outcome: Progressing Goal: Knowledge of the prescribed therapeutic regimen will improve Outcome: Progressing   Problem: Activity: Goal: Risk for activity intolerance will decrease Outcome: Progressing   Problem: Cardiac: Goal: Will achieve and/or maintain hemodynamic stability Outcome: Progressing   Problem: Clinical Measurements: Goal: Postoperative complications will be avoided or minimized Outcome: Progressing   Problem: Respiratory: Goal: Respiratory status will improve Outcome: Progressing   Problem: Pain Management: Goal: Pain level will decrease Outcome: Progressing   Problem: Skin Integrity: Goal: Wound healing without signs and symptoms infection will improve Outcome: Progressing

## 2023-03-06 NOTE — Progress Notes (Signed)
 PROGRESS NOTE  Mathew Wyatt ZOX:[id number] DOB: Sep 06, 1960 DOA: 02/08/2023 PCP: Patient, No Pcp Per   LOS: 26 days   Brief narrative:  Mathew Wyatt is a 63 y.o. male with past medical history of COPD and a smoker who had initially presented to [facility] on 02/07/2023 with shortness of breath dyspnea on exertion with productive cough.  He was noted to be tachypneic with significant leukocytosis and elevated lactate.  Patient was started on IV antibiotic.  CT scan of the chest however was concerning for empyema versus abscess so he was transferred to [facility] on 02/08/2023 for further evaluation.  Patient however went into A-fib with RVR and hypotension and was admitted to ICU service.   1/21: TCTS defer VATS. ICU for afib rvr hypotension, started on amiodarone. CT placed with immediate 2L purulent output. Initial tube lytics with additional 300cc output. ECHO neg for vegetation, EF 45-50% 1/22: repeat CT chest 1/23 new chest tube placed by IR, cultures growing Campylobacter and Peptostreptococcus 1/26 dec'd pleural output but has persistent loculated fluid collection  1/27 AF w/ RVR. Treated w/ BB. Seen by ID; vanc stopped, switched to ceftriaxone, flagyl and azith for campylobacter. CT chest obtained. The anterior fluid collection has improved. The lateral effusion vol is less but loculated air maintains. Now has Right layering effusion, and new patchy areas of GG changes. Some mild dec in left GG changes. Repeated round 4 pleural lytics (administered in right lateral tube)  1/28: lateral CT 120 net neg  1/30 no significant change after 7 doses of pleural lytic therapy.  -1/30, seen by Dr. Cliffton Asters, patient refused VATS -1/31: Changed his mind, now agrees to surgery, TCTS notified -2/1: PCCM restarted Heparin gtt -2/3, hemoglobin down to 6.0, heparin held, transfusing, T CTS eval pending -2/4, reevaluated by Dr. Cliffton Asters, plan for VATS -2/5 underwent VATS and decortication of left  empyema, Dr. Cliffton Asters -2/8, persistent air leak, chest tube to waterseal, antibiotics changed to PO -2/10 persistent small left base pneumothorax -2/12, IV heparin changed to DOAC -2/14: clamping CT> clamped for persistent air leak -2/15: Repeat clamping trial    Subjective: -feels okay, tolerated chest tube clamping, breathing well, wants to go home  Assessment/Plan:  Sepsis, left empyema, pneumonia Acute hypoxic respiratory failure -Status post chest tube placement x2.  Second chest tube placement by IR 02/10/2023.  -Has been on multiple different combinations of antibiotics -Pleural fluid cultures initially grew Campylobacter and Peptostreptococcus, then repeat cultures with Streptococcus constellatus, pansensitive -CT surgery, pulmonary and ID following.    -Has completed 7 doses of fibrinolytic therapy without considerable improvement, T CTS consulted, seen by Dr. Cliffton Asters 1/30 patient refused VATS/decortication, 1/31 changed his mind -Finally underwent VATS/decortication 2/5 -Now on Augmentin,  day 26 of ABX, per ID continue until 3/5, stopped Zithromax -Chest tube management per TCTS, stable after clamping trial yesterday, plan for removal  Diarrhea -Likely secondary to antibiotics, clinically do not suspect C. difficile at this time, continue fiber, probiotics -improving   Dental caries --Orthopantogram with dental caries, unfortunately no inpatient dental coverage available now, would need outpatient follow-up  New onset atrial fibrillation with RVR.   -Was on amiodarone drip, then changed to p.o., has converted to sinus rhythm, -Finally resumed anticoagulation -Now stable, changed to DOAC  Acute on chronic anemia -Hemoglobin trended down last week, transfused -Hemoglobin now stable, back on anticoagulation  Acute systolic congestive heart failure with moderately reduced ejection fraction.  2D echocardiogram 45 to 50% with global hypokinesis. 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  Continue nebulizers.   insomnia anxiety.   -On muscle relaxant Atarax Ambien and pain medication -xanax PRN   DVT prophylaxis: Eliquis disposition: Home pending resolution of air leak    Code Status:    Code Status: Full Code  Family Communication: None at bedside  Consultants: CT surgery PCCM Interventional radiology Cardiology  Procedures: Left-sided chest tube placement x 2. Fibrinolytic treatment x 3  2/5, VATS and decortication  Anti-infectives (From admission, onward)    Start     Dose/Rate Route Frequency Ordered Stop   02/25/23 1200  amoxicillin-clavulanate (AUGMENTIN) 875-125 MG per tablet 1 tablet        1 tablet Oral Every 12 hours 02/25/23 0919 03/22/23 2359   02/24/23 1600  Ampicillin-Sulbactam (UNASYN) 3 g in sodium chloride 0.9 % 100 mL IVPB  Status:  Discontinued        3 g 200 mL/hr over 30 Minutes Intravenous Every 6 hours 02/24/23 0914 02/25/23 0919   02/20/23 1615  metroNIDAZOLE (FLAGYL) tablet 500 mg  Status:  Discontinued        500 mg Oral Every 8 hours 02/20/23 1516 02/24/23 0914   02/20/23 1615  ceFEPIme (MAXIPIME) 2 g in sodium chloride 0.9 % 100 mL IVPB  Status:  Discontinued        2 g 200 mL/hr over 30 Minutes Intravenous Every 8 hours 02/20/23 1519 02/24/23 0914   02/17/23 2000  Ampicillin-Sulbactam (UNASYN) 3 g in sodium chloride 0.9 % 100 mL IVPB  Status:  Discontinued        3 g 200 mL/hr over 30 Minutes Intravenous Every 6 hours 02/17/23 0917 02/20/23 1516   02/14/23 2200  metroNIDAZOLE (FLAGYL) tablet 500 mg  Status:  Discontinued        500 mg Oral Every 12 hours 02/14/23 1412 02/17/23 0917   02/14/23 2000  cefTRIAXone (ROCEPHIN) 2 g in sodium chloride 0.9 % 100 mL IVPB  Status:  Discontinued        2 g 200 mL/hr over 30 Minutes Intravenous Every 24 hours 02/14/23 1410 02/17/23 0917    02/14/23 1500  azithromycin (ZITHROMAX) tablet 500 mg  Status:  Discontinued        500 mg Oral Daily 02/14/23 1410 03/04/23 0922   02/12/23 0200  vancomycin (VANCOREADY) IVPB 1500 mg/300 mL  Status:  Discontinued        1,500 mg 150 mL/hr over 120 Minutes Intravenous Every 12 hours 02/11/23 1301 02/14/23 1410   02/11/23 1430  vancomycin (VANCOCIN) 750 mg in sodium chloride 0.9 % 250 mL IVPB  Status:  Discontinued        750 mg 265 mL/hr over 60 Minutes Intravenous  Once 02/11/23 1309 02/11/23 1354   02/11/23 1430  vancomycin (VANCOCIN) 750 mg in sodium chloride 0.9 % 250 mL IVPB        750 mg 250 mL/hr over 60 Minutes Intravenous  Once 02/11/23 1354 02/11/23 1543   02/11/23 1400  vancomycin (VANCOREADY) IVPB 750 mg/150 mL  Status:  Discontinued        750 mg 150 mL/hr over 60 Minutes Intravenous  Once 02/11/23 1301 02/11/23 1309   02/10/23 2130  metroNIDAZOLE (FLAGYL) IVPB 500 mg  Status:  Discontinued        500 mg 100 mL/hr over 60 Minutes Intravenous Every 12 hours 02/10/23 1434 02/14/23 1410   02/08/23  1730  metroNIDAZOLE (FLAGYL) IVPB 500 mg  Status:  Discontinued        500 mg 100 mL/hr over 60 Minutes Intravenous Every 8 hours 02/08/23 1637 02/10/23 1434   02/08/23 0400  vancomycin (VANCOREADY) IVPB 750 mg/150 mL  Status:  Discontinued        750 mg 150 mL/hr over 60 Minutes Intravenous Every 12 hours 02/08/23 0324 02/08/23 0328   02/08/23 0400  ceFEPIme (MAXIPIME) 2 g in sodium chloride 0.9 % 100 mL IVPB  Status:  Discontinued        2 g 200 mL/hr over 30 Minutes Intravenous Every 8 hours 02/08/23 0324 02/14/23 1410   02/08/23 0400  vancomycin (VANCOCIN) 750 mg in sodium chloride 0.9 % 250 mL IVPB  Status:  Discontinued        750 mg 250 mL/hr over 60 Minutes Intravenous Every 12 hours 02/08/23 0328 02/11/23 1258       Objective: Vitals:   03/06/23 0350 03/06/23 0709  BP: 102/71 98/70  Pulse: 77 85  Resp: 15 19  Temp: 97.9 F (36.6 C) 98.3 F (36.8 C)  SpO2: 92% 91%     Intake/Output Summary (Last 24 hours) at 03/06/2023 0931 Last data filed at 03/06/2023 0751 Gross per 24 hour  Intake 240 ml  Output 500 ml  Net -260 ml   Filed Weights   03/03/23 0616 03/04/23 0520 03/05/23 0500  Weight: 68.1 kg 68.9 kg 70.3 kg   Body mass index is 21.02 kg/m.   Physical Exam:  Chronically ill male sitting up in bed, AAOx3 HEENT: No JVD CVS: S1-S2, regular rhythm  Lungs: Decreased breath sounds at the bases, chest tube noted  Abdomen: Soft, nontender, bowel sounds present Extremities: No edema  Data Review: I have personally reviewed the following laboratory data and studies,  CBC: Recent Labs  Lab 02/28/23 0459 03/01/23 0615 03/02/23 0612 03/03/23 0631 03/06/23 0538  WBC 7.7 8.5 9.5 8.5 9.2  HGB 10.4* 10.4* 10.9* 10.8* 10.9*  HCT 33.6* 33.3* 34.5* 35.1* 34.9*  MCV 94.6 95.7 95.0 96.2 95.6  PLT 478* 499* 497* 481* 417*   Basic Metabolic Panel: Recent Labs  Lab 03/01/23 0630 03/03/23 0631 03/06/23 0538  NA 135 135 138  K 4.5 4.6 3.7  CL 99 91* 98  CO2 29 31 30   GLUCOSE 137* 164* 110*  BUN 14 16 18   CREATININE 0.89 0.83 0.72  CALCIUM 9.2 10.1 9.7   Liver Function Tests: No results for input(s): "AST", "ALT", "ALKPHOS", "BILITOT", "PROT", "ALBUMIN" in the last 168 hours.   No results for input(s): "LIPASE", "AMYLASE" in the last 168 hours.  No results for input(s): "AMMONIA" in the last 168 hours. Cardiac Enzymes: No results for input(s): "CKTOTAL", "CKMB", "CKMBINDEX", "TROPONINI" in the last 168 hours. BNP (last 3 results) No results for input(s): "BNP" in the last 8760 hours.  ProBNP (last 3 results) No results for input(s): "PROBNP" in the last 8760 hours.  CBG: No results for input(s): "GLUCAP" in the last 168 hours.  No results found for this or any previous visit (from the past 240 hours).    Studies: DG CHEST PORT 1 VIEW Result Date: 03/06/2023 CLINICAL DATA:  [id number] Pneumothorax on left [id number]. EXAM: PORTABLE  CHEST 1 VIEW COMPARISON:  03/05/2023. FINDINGS: Redemonstration of loculated hydropneumothorax along the left lower lateral aspect, which appear grossly similar to the prior study. There are additional nonspecific opacities in the left mid lower lung zones, which may represent combination of atelectasis and/or  pneumonia. Left-sided pleural drainage catheter is unchanged in position. Right lung and right lateral costophrenic angle are grossly clear. No right pneumothorax. Stable cardio-mediastinal silhouette. No acute osseous abnormalities. The soft tissues are within normal limits. Left-sided PICC line is again seen with its tip overlying the midportion of superior vena cava. IMPRESSION: 1. Essentially stable exam. 2. Stable loculated left hydropneumothorax along the left lower lateral chest wall. Electronically Signed   By: Jules Schick M.D.   On: 03/06/2023 08:24   DG CHEST PORT 1 VIEW Result Date: 03/05/2023 CLINICAL DATA:  Follow-up pneumothorax EXAM: PORTABLE CHEST 1 VIEW COMPARISON:  03/04/2023 FINDINGS: Left arm PICC line tip is in the distal SVC. There is a left-sided chest tube which is stable in position. Loculated left-sided hydropneumothorax is again seen and is unchanged compared with the previous exam. Overlying opacities may represent airspace disease and or atelectasis. Right lung is clear. Stable cardiomediastinal contours. IMPRESSION: 1. Stable loculated left-sided hydropneumothorax. 2. Stable left-sided chest tube. 3. Stable overlying opacities. Electronically Signed   By: Signa Kell M.D.   On: 03/05/2023 09:41   DG CHEST PORT 1 VIEW Result Date: 03/04/2023 CLINICAL DATA:  [id number] Pneumothorax on left [id number] EXAM: PORTABLE CHEST 1 VIEW COMPARISON:  Same-day x-ray FINDINGS: Left-sided pneumothorax at the lateral left chest is stable to minimally increased in size compared to the previous study. Left chest tube remains in place. Similar left basilar opacity. Right lung remains clear.  Stable left PICC line. IMPRESSION: Left-sided pneumothorax is stable to minimally increased in size compared to the previous study. Left chest tube remains in place. Electronically Signed   By: Duanne Guess D.O.   On: 03/04/2023 16:53       Zannie Cove, MD  [facility] 03/06/2023  If 7PM-7AM, please contact night-coverage

## 2023-03-06 NOTE — Progress Notes (Signed)
 Mobility Specialist Progress Note:    03/06/23 1500  Mobility  Activity Ambulated independently in hallway  Level of Assistance Standby assist, set-up cues, supervision of patient - no hands on  Assistive Device None  Distance Ambulated (ft) 250 ft  Activity Response Tolerated well  Mobility Referral Yes  Mobility visit 1 Mobility  Mobility Specialist Start Time (ACUTE ONLY) 1400  Mobility Specialist Stop Time (ACUTE ONLY) 1408  Mobility Specialist Time Calculation (min) (ACUTE ONLY) 8 min   Received pt in bed having no complaints and agreeable to mobility. Pt was asymptomatic throughout ambulation and returned to room w/o fault. Left in bed w/ call bell in reach and all needs met.   [place]ante Earlene Plater Mobility Specialist Please contact via Special educational needs teacher or Rehab office at [phone]

## 2023-03-06 NOTE — Progress Notes (Signed)
 Patient ambulated in hallways x2 with no problems. Patient did not need front wheel walker. Oxygen 94% on room air after walking.

## 2023-03-07 ENCOUNTER — Inpatient Hospital Stay (HOSPITAL_COMMUNITY): Payer: Medicaid Other

## 2023-03-07 ENCOUNTER — Other Ambulatory Visit (HOSPITAL_COMMUNITY): Payer: Self-pay

## 2023-03-07 LAB — BASIC METABOLIC PANEL
Anion gap: 9 (ref 5–15)
BUN: 24 mg/dL — ABNORMAL HIGH (ref 8–23)
CO2: 27 mmol/L (ref 22–32)
Calcium: 9.2 mg/dL (ref 8.9–10.3)
Chloride: 103 mmol/L (ref 98–111)
Creatinine, Ser: 0.84 mg/dL (ref 0.61–1.24)
GFR, Estimated: >60 mL/min (ref >60)
Glucose, Bld: 153 mg/dL — ABNORMAL HIGH (ref 70–99)
Potassium: 4 mmol/L (ref 3.5–5.1)
Sodium: 139 mmol/L (ref 135–145)

## 2023-03-07 LAB — CBC
HCT: 32.6 % — ABNORMAL LOW (ref 39.0–52.0)
Hemoglobin: 10.5 g/dL — ABNORMAL LOW (ref 13.0–17.0)
MCH: 30.8 pg (ref 26.0–34.0)
MCHC: 32.2 g/dL (ref 30.0–36.0)
MCV: 95.6 fL (ref 80.0–100.0)
Platelets: 328 10*3/uL (ref 150–400)
RBC: 3.41 MIL/uL — ABNORMAL LOW (ref 4.22–5.81)
RDW: 17.9 % — ABNORMAL HIGH (ref 11.5–15.5)
WBC: 8.7 10*3/uL (ref 4.0–10.5)
nRBC: 0 % (ref 0.0–0.2)

## 2023-03-07 MED ORDER — OXYCODONE HCL 5 MG PO TABS
5.0000 mg | ORAL_TABLET | Freq: Four times a day (QID) | ORAL | 0 refills | Status: DC | PRN
  Filled 2023-03-07: qty 30, 8d supply, fill #0

## 2023-03-07 MED ORDER — SACCHAROMYCES BOULARDII 250 MG PO CAPS
250.0000 mg | ORAL_CAPSULE | Freq: Two times a day (BID) | ORAL | 0 refills | Status: AC
  Filled 2023-03-07: qty 32, 16d supply, fill #0

## 2023-03-07 MED ORDER — AMIODARONE HCL 200 MG PO TABS
200.0000 mg | ORAL_TABLET | Freq: Every day | ORAL | 1 refills | Status: DC
  Filled 2023-03-07: qty 30, 30d supply, fill #0

## 2023-03-07 MED ORDER — GABAPENTIN 300 MG PO CAPS
300.0000 mg | ORAL_CAPSULE | Freq: Two times a day (BID) | ORAL | 1 refills | Status: DC
  Filled 2023-03-07: qty 60, 30d supply, fill #0

## 2023-03-07 MED ORDER — SPIRONOLACTONE 25 MG PO TABS
12.5000 mg | ORAL_TABLET | Freq: Every day | ORAL | 1 refills | Status: DC
  Filled 2023-03-07: qty 15, 30d supply, fill #0

## 2023-03-07 MED ORDER — MELATONIN 5 MG PO TABS
5.0000 mg | ORAL_TABLET | Freq: Every day | ORAL | 0 refills | Status: DC
  Filled 2023-03-07: qty 30, 30d supply, fill #0

## 2023-03-07 MED ORDER — ACETAMINOPHEN 325 MG PO TABS: 650.0000 mg | ORAL_TABLET | Freq: Four times a day (QID) | ORAL | Status: DC | PRN

## 2023-03-07 MED ORDER — PANTOPRAZOLE SODIUM 40 MG PO TBEC
40.0000 mg | DELAYED_RELEASE_TABLET | Freq: Every day | ORAL | 0 refills | Status: DC
  Filled 2023-03-07: qty 30, 30d supply, fill #0

## 2023-03-07 MED ORDER — AMOXICILLIN-POT CLAVULANATE 875-125 MG PO TABS
1.0000 | ORAL_TABLET | Freq: Two times a day (BID) | ORAL | 0 refills | Status: AC
  Filled 2023-03-07: qty 32, 16d supply, fill #0

## 2023-03-07 MED ORDER — HYDROXYZINE HCL 10 MG PO TABS
10.0000 mg | ORAL_TABLET | Freq: Three times a day (TID) | ORAL | 0 refills | Status: DC | PRN
  Filled 2023-03-07: qty 20, 7d supply, fill #0

## 2023-03-07 MED ORDER — POLYETHYLENE GLYCOL 3350 17 GM/SCOOP PO POWD
17.0000 g | Freq: Every day | ORAL | 0 refills | Status: DC | PRN
  Filled 2023-03-07: qty 238, 14d supply, fill #0

## 2023-03-07 MED ORDER — APIXABAN 5 MG PO TABS
5.0000 mg | ORAL_TABLET | Freq: Two times a day (BID) | ORAL | 1 refills | Status: DC
  Filled 2023-03-07: qty 60, 30d supply, fill #0

## 2023-03-07 MED ORDER — METOPROLOL TARTRATE 25 MG PO TABS
12.5000 mg | ORAL_TABLET | Freq: Two times a day (BID) | ORAL | 1 refills | Status: DC
  Filled 2023-03-07: qty 30, 30d supply, fill #0

## 2023-03-07 NOTE — Progress Notes (Signed)
 LUE PICC removed. Site unremarkable. Manual pressure held x5 minutes. Pressure dressing applied. Pt instructed to keep dressing dry and intact x 24 hours. Remain flat in bed for :30. He voiced understanding. RN aware.

## 2023-03-07 NOTE — Plan of Care (Signed)
  Problem: Fluid Volume: Goal: Hemodynamic stability will improve Outcome: Adequate for Discharge   Problem: Clinical Measurements: Goal: Diagnostic test results will improve Outcome: Adequate for Discharge Goal: Signs and symptoms of infection will decrease Outcome: Adequate for Discharge   Problem: Respiratory: Goal: Ability to maintain adequate ventilation will improve Outcome: Adequate for Discharge   Problem: Education: Goal: Knowledge of General Education information will improve Description: Including pain rating scale, medication(s)/side effects and non-pharmacologic comfort measures Outcome: Adequate for Discharge   Problem: Health Behavior/Discharge Planning: Goal: Ability to manage health-related needs will improve Outcome: Adequate for Discharge   Problem: Clinical Measurements: Goal: Ability to maintain clinical measurements within normal limits will improve Outcome: Adequate for Discharge Goal: Will remain free from infection Outcome: Adequate for Discharge Goal: Diagnostic test results will improve Outcome: Adequate for Discharge Goal: Respiratory complications will improve Outcome: Adequate for Discharge Goal: Cardiovascular complication will be avoided Outcome: Adequate for Discharge   Problem: Activity: Goal: Risk for activity intolerance will decrease Outcome: Adequate for Discharge   Problem: Nutrition: Goal: Adequate nutrition will be maintained Outcome: Adequate for Discharge   Problem: Coping: Goal: Level of anxiety will decrease Outcome: Adequate for Discharge   Problem: Elimination: Goal: Will not experience complications related to bowel motility Outcome: Adequate for Discharge Goal: Will not experience complications related to urinary retention Outcome: Adequate for Discharge   Problem: Pain Managment: Goal: General experience of comfort will improve and/or be controlled Outcome: Adequate for Discharge   Problem: Safety: Goal:  Ability to remain free from injury will improve Outcome: Adequate for Discharge   Problem: Skin Integrity: Goal: Risk for impaired skin integrity will decrease Outcome: Adequate for Discharge   Problem: Acute Rehab PT Goals(only PT should resolve) Goal: Pt Will Go Supine/Side To Sit Outcome: Adequate for Discharge Goal: Patient Will Transfer Sit To/From Stand Outcome: Adequate for Discharge Goal: Pt Will Transfer Bed To Chair/Chair To Bed Outcome: Adequate for Discharge Goal: Pt Will Ambulate Outcome: Adequate for Discharge Goal: Pt Will Go Up/Down Stairs Outcome: Adequate for Discharge Goal: Pt/caregiver will Perform Home Exercise Program Outcome: Adequate for Discharge   Problem: Education: Goal: Knowledge of disease or condition will improve Outcome: Adequate for Discharge Goal: Knowledge of the prescribed therapeutic regimen will improve Outcome: Adequate for Discharge   Problem: Activity: Goal: Ability to tolerate increased activity will improve Outcome: Adequate for Discharge Goal: Will verbalize the importance of balancing activity with adequate rest periods Outcome: Adequate for Discharge   Problem: Respiratory: Goal: Ability to maintain a clear airway will improve Outcome: Adequate for Discharge Goal: Levels of oxygenation will improve Outcome: Adequate for Discharge Goal: Ability to maintain adequate ventilation will improve Outcome: Adequate for Discharge   Problem: Education: Goal: Knowledge of disease or condition will improve Outcome: Adequate for Discharge Goal: Knowledge of the prescribed therapeutic regimen will improve Outcome: Adequate for Discharge   Problem: Activity: Goal: Risk for activity intolerance will decrease Outcome: Adequate for Discharge   Problem: Cardiac: Goal: Will achieve and/or maintain hemodynamic stability Outcome: Adequate for Discharge   Problem: Clinical Measurements: Goal: Postoperative complications will be avoided or  minimized Outcome: Adequate for Discharge   Problem: Respiratory: Goal: Respiratory status will improve Outcome: Adequate for Discharge   Problem: Pain Management: Goal: Pain level will decrease Outcome: Adequate for Discharge   Problem: Skin Integrity: Goal: Wound healing without signs and symptoms infection will improve Outcome: Adequate for Discharge

## 2023-03-07 NOTE — Discharge Summary (Addendum)
 Physician Discharge Summary  Mathew Wyatt OVF:[id number] DOB: [birth year] DOA: 02/08/2023  PCP: Patient, No Pcp Per  Admit date: 02/08/2023 Discharge date: 03/07/2023  Time spent: 45 minutes  Recommendations for Outpatient Follow-up:  Dr. Cliffton Asters on 2/28 with repeat x-ray [facility] in 3 weeks, appointment requested   Discharge Diagnoses:  Principal Problem:   Empyema (HCC) Hydropneumothorax   Atrial fibrillation with rapid ventricular response (HCC) Acute systolic CHF Tobacco use COPD   Pressure injury of skin   COPD (chronic obstructive pulmonary disease) (HCC)   AKI (acute kidney injury) (HCC)   Discharge Condition: Improved  Diet recommendation: Low-salt DM, heart healthy  Filed Weights   03/05/23 0500 03/06/23 0701 03/07/23 0411  Weight: 70.3 kg 70.3 kg 70.1 kg    History of present illness:  Mathew Wyatt is a 63 y.o. male with past medical history of COPD and a smoker who had initially presented to [facility] hospital on 02/07/2023 with shortness of breath dyspnea on exertion with productive cough.  He was noted to be tachypneic with significant leukocytosis and elevated lactate.  Patient was started on IV antibiotic.  CT scan of the chest however was concerning for empyema versus abscess so he was transferred to [facility] on 02/08/2023 for further evaluation.  Patient however went into A-fib with RVR and hypotension and was admitted to ICU service.   1/21: TCTS defer VATS. TX to  ICU for afib rvr hypotension, started on amiodarone. Chest tube placed with immediate 2L purulent output.   ECHO neg for vegetation, EF 45-50% 1/23 new chest tube placed by IR, cultures growing Campylobacter and Peptostreptococcus 1/26 dec'd pleural output but has persistent loculated fluid collection  1/27 AF w/ RVR. Treated w/ BB. Seen by ID; vanc stopped, switched to ceftriaxone, flagyl and azith for campylobacter. CT chest obtained. The anterior fluid collection has improved. The  lateral effusion vol is less but loculated air maintains. Now has Right layering effusion, and new patchy areas of GG changes. Some mild dec in left GG changes. Repeated round 4 pleural lytics (administered in right lateral tube)  1/28: lateral CT 120 net neg  1/30 no significant change after 7 doses of pleural lytic therapy.  -1/30, seen by Dr. Cliffton Asters, patient refused VATS -1/31: Changed his mind, now agrees to surgery, TCTS notified -2/1: PCCM restarted Heparin gtt -2/3, hemoglobin down to 6.0, heparin held, transfused -2/4, reevaluated by Dr. Cliffton Asters, plan for VATS -2/5 underwent VATS and decortication of left empyema, Dr. Cliffton Asters -2/8, persistent air leak, chest tube to waterseal, antibiotics changed to PO -2/10 persistent small left base pneumothorax -2/12, IV heparin changed to DOAC -2/14: clamping CT> clamped for persistent air leak -2/15: Repeat clamping trial  -2/16: Chest tube removed  Hospital Course:   Sepsis, left empyema, pneumonia Acute hypoxic respiratory failure -Status post chest tube placement x2.  Second chest tube placement by IR 02/10/2023.  -Has been on multiple different combinations of antibiotics -Pleural fluid cultures initially grew Campylobacter and Peptostreptococcus, then repeat cultures with Streptococcus constellatus, pansensitive -CT surgery, pulmonary and ID following.    -Has completed 7 doses of fibrinolytic therapy without considerable improvement, T CTS consulted, seen by Dr. Cliffton Asters 1/30 patient refused VATS/decortication, 1/31 changed his mind -Finally underwent VATS/decortication 2/5 -Now on Augmentin,  day 26 of ABX, per ID continue until 3/5, stopped Zithromax -Chest tube removed 2/16, repeat x-ray is stable with small persistent hydropneumothorax, no symptoms, cleared for discharge per T CTS today, he has a follow-up in 10  days on 2/28    Diarrhea -Likely secondary to antibiotics, clinically do not suspect C. difficile at this time,  continue fiber, probiotics -improving    Dental caries --Orthopantogram with dental caries, unfortunately no inpatient dental coverage available now, would need outpatient follow-up   New onset atrial fibrillation with RVR.   -Was on amiodarone drip, then changed to p.o., has converted to sinus rhythm, continue metoprolol as well -Finally resumed anticoagulation now changed to Eliquis   Acute on chronic anemia -Hemoglobin trended down last week, transfused -Repeat Hb now stable, back on anticoagulation   Acute systolic congestive heart failure with moderately reduced ejection fraction.  2D echocardiogram 45 to 50% with global hypokinesis. 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  Continue nebulizers.    insomnia anxiety.   -On muscle relaxant Atarax Ambien and pain medication -xanax PRN    Discharge Exam: Vitals:   03/07/23 0239 03/07/23 0738  BP: 93/62 99/73  Pulse: 83 89  Resp: 16 16  Temp: 97.8 F (36.6 C) 97.6 F (36.4 C)  SpO2: 93% 93%   Gen: Awake, Alert, Oriented X 3,  HEENT: no JVD Lungs: Decreased breath sounds on the left CVS: S1S2/RRR Abd: soft, Non tender, non distended, BS present Extremities: No edema Skin: no new rashes on exposed skin   Discharge Instructions   Discharge Instructions     Diet - low sodium heart healthy   Complete by: As directed    Discharge wound care:   Complete by: As directed    routine   Increase activity slowly   Complete by: As directed       Allergies as of 03/07/2023   No Known Allergies      Medication List     TAKE these medications    acetaminophen 325 MG tablet Commonly known as: TYLENOL Take 2 tablets (650 mg total) by mouth every 6 (six) hours as needed for mild pain (pain  score 1-3) (or Fever >/= 101).   amiodarone 200 MG tablet Commonly known as: PACERONE Take 1 tablet (200 mg total) by mouth daily.   amoxicillin-clavulanate 875-125 MG tablet Commonly known as: AUGMENTIN Take 1 tablet by mouth every 12 (twelve) hours for 16 days.   apixaban 5 MG Tabs tablet Commonly known as: ELIQUIS Take 1 tablet (5 mg total) by mouth 2 (two) times daily.   gabapentin 300 MG capsule Commonly known as: NEURONTIN Take 1 capsule (300 mg total) by mouth 2 (two) times daily.   hydrOXYzine 10 MG tablet Commonly known as: ATARAX Take 1 tablet (10 mg total) by mouth 3 (three) times daily as needed for anxiety.   melatonin 5 MG Tabs Take 1 tablet (5 mg total) by mouth at bedtime.   metoprolol tartrate 25 MG tablet Commonly known as: LOPRESSOR Take 0.5 tablets (12.5 mg total) by mouth 2 (two) times daily.   oxyCODONE 5 MG immediate release tablet Commonly known as: Oxy IR/ROXICODONE Take 1 tablet (5 mg total) by mouth every 6 (six) hours as needed for moderate pain (pain score 4-6) or severe pain (pain score 7-10).   pantoprazole 40 MG tablet Commonly known as: PROTONIX Take 1 tablet (40 mg total) by mouth daily.   polyethylene glycol 17 g packet Commonly known as: MIRALAX / GLYCOLAX Take 17 g  by mouth daily as needed for moderate constipation.   saccharomyces boulardii 250 MG capsule Commonly known as: FLORASTOR Take 1 capsule (250 mg total) by mouth 2 (two) times daily for 16 days.   spironolactone 25 MG tablet Commonly known as: ALDACTONE Take 0.5 tablets (12.5 mg total) by mouth daily.               Durable Medical Equipment  (From admission, onward)           Start     Ordered   02/25/23 1333  For home use only DME Walker rolling  Once       Question Answer Comment  Walker: With 5 Inch Wheels   Patient needs a walker to treat with the following condition Weakness      02/25/23 1332              Discharge Care Instructions   (From admission, onward)           Start     Ordered   03/07/23 0000  Discharge wound care:       Comments: routine   03/07/23 0926           No Known Allergies  Follow-up Information     Corliss Skains, MD. Go on 03/18/2023.   Specialty: Cardiothoracic Surgery Why: Your appointment is at 10am. Contact information: [address] [place] [address] [phone]         Hillery Aldo, MD .   Specialty: Family Medicine Contact information: [address] [place] [address] [phone]                  The results of significant diagnostics from this hospitalization (including imaging, microbiology, ancillary and laboratory) are listed below for reference.    Significant Diagnostic Studies: DG CHEST PORT 1 VIEW Result Date: 03/06/2023 CLINICAL DATA:  [id number] Pneumothorax on left [id number] EXAM: PORTABLE CHEST 1 VIEW COMPARISON:  March 06, 2023 FINDINGS: The cardiomediastinal silhouette is unchanged in contour.LEFT upper extremity PICC tip terminates over the superior cavoatrial junction. Removal of the LEFT-sided chest tube. There is revisualization of a loculated LEFT lateral hydropneumothorax. This is similar in comparison to prior. No new RIGHT-sided pleural effusion or pneumothorax. Similar appearance of heterogeneous opacities in the LEFT lower and lateral lung. IMPRESSION: Interval removal of the LEFT-sided chest tube. There is revisualization of a loculated appearing LEFT lateral hydropneumothorax, similar in comparison to prior. Electronically Signed   By: Meda Klinefelter M.D.   On: 03/06/2023 16:30   DG CHEST PORT 1 VIEW Result Date: 03/06/2023 CLINICAL DATA:  [id number] Pneumothorax on left [id number]. EXAM: PORTABLE CHEST 1 VIEW COMPARISON:  03/05/2023. FINDINGS: Redemonstration of loculated hydropneumothorax along the left lower lateral aspect, which appear grossly similar to the prior study. There are additional  nonspecific opacities in the left mid lower lung zones, which may represent combination of atelectasis and/or pneumonia. Left-sided pleural drainage catheter is unchanged in position. Right lung and right lateral costophrenic angle are grossly clear. No right pneumothorax. Stable cardio-mediastinal silhouette. No acute osseous abnormalities. The soft tissues are within normal limits. Left-sided PICC line is again seen with its tip overlying the midportion of superior vena cava. IMPRESSION: 1. Essentially stable exam. 2. Stable loculated left hydropneumothorax along the left lower lateral chest wall. Electronically Signed   By: Jules Schick M.D.   On: 03/06/2023 08:24   DG CHEST PORT 1 VIEW Result Date: 03/05/2023 CLINICAL DATA:  Follow-up  pneumothorax EXAM: PORTABLE CHEST 1 VIEW COMPARISON:  03/04/2023 FINDINGS: Left arm PICC line tip is in the distal SVC. There is a left-sided chest tube which is stable in position. Loculated left-sided hydropneumothorax is again seen and is unchanged compared with the previous exam. Overlying opacities may represent airspace disease and or atelectasis. Right lung is clear. Stable cardiomediastinal contours. IMPRESSION: 1. Stable loculated left-sided hydropneumothorax. 2. Stable left-sided chest tube. 3. Stable overlying opacities. Electronically Signed   By: Signa Kell M.D.   On: 03/05/2023 09:41   DG CHEST PORT 1 VIEW Result Date: 03/04/2023 CLINICAL DATA:  [id number] Pneumothorax on left [id number] EXAM: PORTABLE CHEST 1 VIEW COMPARISON:  Same-day x-ray FINDINGS: Left-sided pneumothorax at the lateral left chest is stable to minimally increased in size compared to the previous study. Left chest tube remains in place. Similar left basilar opacity. Right lung remains clear. Stable left PICC line. IMPRESSION: Left-sided pneumothorax is stable to minimally increased in size compared to the previous study. Left chest tube remains in place. Electronically Signed   By: Duanne Guess D.O.   On: 03/04/2023 16:53   DG Chest 2 View Result Date: 03/04/2023 CLINICAL DATA:  Pneumothorax on left. EXAM: CHEST - 2 VIEW COMPARISON:  03/03/2023 FINDINGS: Left chest tube is stable with the tip in the left hilar region. Stable appearance of the pleural air along the lateral left chest. Patchy pleural and parenchymal densities in the left lung are unchanged. Right lung remains clear. Heart and mediastinum are stable. Left arm PICC line terminates in the SVC region. IMPRESSION: 1. Stable appearance of the left chest tube and persistent left pleural air. 2. Patchy pleural and parenchymal densities in the left lung are unchanged. Electronically Signed   By: Richarda Overlie M.D.   On: 03/04/2023 09:30   DG CHEST PORT 1 VIEW Result Date: 03/03/2023 CLINICAL DATA:  Pneumothorax on left. EXAM: PORTABLE CHEST 1 VIEW COMPARISON:  Radiograph yesterday FINDINGS: Left-sided chest tube remains in place, tip in the mid hemithorax. Similar appearance of small lateral left pneumothorax. Unchanged left lung volume loss, pleural effusion/thickening and ill-defined opacity. Left upper extremity PICC remains in place. No focal right lung opacity. IMPRESSION: 1. Left-sided chest tube in place with unchanged appearance of small left pneumothorax. 2. Stable appearance of volume loss in the left hemithorax, pleural effusion/thickening and ill-defined opacity. Electronically Signed   By: Narda Rutherford M.D.   On: 03/03/2023 10:20   DG CHEST PORT 1 VIEW Result Date: 03/02/2023 CLINICAL DATA:  Left-sided pneumothorax EXAM: PORTABLE CHEST 1 VIEW COMPARISON:  03/01/2023 FINDINGS: Left chest tube is again identified and stable. Persistent lateral left pneumothorax is seen but slightly smaller than that noted on the prior exam. Persistent pleural based opacity is seen. Right lung is clear. IMPRESSION: Slight decrease in left pneumothorax. Electronically Signed   By: Alcide Clever M.D.   On: 03/02/2023 09:46   DG CHEST PORT 1  VIEW Result Date: 03/01/2023 CLINICAL DATA:  Follow-up pneumothorax EXAM: PORTABLE CHEST 1 VIEW COMPARISON:  02/28/2023 FINDINGS: Pigtail catheter is again noted on the left. The overall appearance of the lateral pneumothorax is stable. Persistent pleural based density in the left lung is noted. No new focal abnormality is seen. IMPRESSION: Stable appearance of the left pneumothorax and parenchymal opacities when compared with the prior day. Electronically Signed   By: Alcide Clever M.D.   On: 03/01/2023 09:48   DG CHEST PORT 1 VIEW Result Date: 02/28/2023 CLINICAL DATA:  Pneumothorax. EXAM: PORTABLE CHEST  1 VIEW COMPARISON:  02/27/2023 FINDINGS: Left chest tube is stable in position. There is a persistent lateral left pneumothorax that has minimally changed. Parenchymal densities in the mid and lower left lung are unchanged. Left arm PICC line is in the SVC region and stable. Heart size is stable. No focal right lung disease. IMPRESSION: 1. Persistent left pneumothorax with a left chest tube in place. No significant change. 2. Persistent parenchymal densities in the left lung. Electronically Signed   By: Richarda Overlie M.D.   On: 02/28/2023 09:53   DG CHEST PORT 1 VIEW Result Date: 02/27/2023 CLINICAL DATA:  Shortness of breath EXAM: PORTABLE CHEST 1 VIEW COMPARISON:  02/26/2023 FINDINGS: Left chest tube remains in place. Persistent left-sided loculated pneumothorax is noted laterally. Stable parenchymal density is noted throughout the mid and lower left lung. No bony abnormality is seen. IMPRESSION: Chest tube in satisfactory position with stable loculated pneumothorax laterally. The remainder of the exam is stable from the previous day. Electronically Signed   By: Alcide Clever M.D.   On: 02/27/2023 10:28   DG CHEST PORT 1 VIEW Result Date: 02/26/2023 CLINICAL DATA:  [id number] Pneumothorax on left [id number] EXAM: PORTABLE CHEST 1 VIEW COMPARISON:  Chest radiograph from one day prior. FINDINGS: Left PICC terminates  over the lower third of the SVC. Stable left chest tube. Stable cardiomediastinal silhouette with normal heart size. No right pneumothorax. Similar to minimally increased small lateral basilar left pneumothorax. No pleural effusion. Patchy dense consolidation throughout the peripheral mid to lower left lung, unchanged. IMPRESSION: 1. Similar to minimally increased small lateral basilar left pneumothorax. Stable left chest tube. 2. Patchy dense consolidation throughout the peripheral mid to lower left lung, unchanged. Electronically Signed   By: Delbert Phenix M.D.   On: 02/26/2023 10:46   DG CHEST PORT 1 VIEW Result Date: 02/25/2023 CLINICAL DATA:  Left-sided pleural effusion, follow-up EXAM: PORTABLE CHEST 1 VIEW COMPARISON:  02/24/2023 FINDINGS: Left-sided chest tube is again seen and stable. No pneumothorax is noted. Persistent pleural based density is noted on the left stable from the prior exam. Left-sided PICC is noted in satisfactory position. Cardiac shadow is stable. IMPRESSION: Stable appearance when compared with the previous day. Electronically Signed   By: Alcide Clever M.D.   On: 02/25/2023 11:08   DG Chest Port 1 View Result Date: 02/24/2023 CLINICAL DATA:  Pneumothorax follow-up. EXAM: PORTABLE CHEST 1 VIEW COMPARISON:  Chest x-ray from yesterday. FINDINGS: Unchanged left upper extremity PICC line and left-sided chest tube. The heart size and mediastinal contours are within normal limits. Stable volume loss in the left hemithorax with similar degree of pleural thickening without definite pneumothorax. Similar degree of consolidation in the peripheral mid and basilar left lung. Similar interstitial opacities in the right upper lobe. No acute osseous abnormality. IMPRESSION: 1. Unchanged left-sided chest tube without definite pneumothorax. 2. Similar bilateral airspace disease, worse on the left. Electronically Signed   By: Obie Dredge M.D.   On: 02/24/2023 09:59   DG Chest Port 1 View Result  Date: 02/23/2023 CLINICAL DATA:  Follow-up left pneumothorax EXAM: PORTABLE CHEST 1 VIEW COMPARISON:  02/21/2023 FINDINGS: Previously seen left pigtail thoracostomy tube has been replaced with a traditional thoracostomy tube. There is much less pleural density present on the left subsequently. Small amount of pleural density persists along with parenchymal density in the mid and lower lung. Hazy density in the right lung is unchanged. No visible pleural air. Left arm PICC tip again in the SVC above the  right atrium. IMPRESSION: Replacement of the left pigtail thoracostomy tube with a traditional thoracostomy tube. Much less pleural density on the left subsequently. Small amount of pleural density persists along with parenchymal density in the mid and lower lung. Electronically Signed   By: Paulina Fusi M.D.   On: 02/23/2023 14:55   DG CHEST PORT 1 VIEW Result Date: 02/21/2023 CLINICAL DATA:  Left-sided chest tube EXAM: PORTABLE CHEST 1 VIEW COMPARISON:  02/20/2023 FINDINGS: Left pleural drain again noted with persistent loculated left-sided hydropneumothorax. Consolidative airspace disease in the left mid lung is similar to prior. There is persistent diffuse interstitial and patchy airspace disease in the right upper lung. The cardio pericardial silhouette is enlarged. Left PICC line tip overlies the mid SVC level. Telemetry leads overlie the chest. IMPRESSION: 1. No substantial change. 2. Persistent loculated left-sided hydropneumothorax with left pleural drain in place. 3. Persistent consolidative airspace disease in the left mid lung. 4. Persistent diffuse interstitial and patchy airspace disease in the right upper lung. Electronically Signed   By: Kennith Center M.D.   On: 02/21/2023 10:11   DG CHEST PORT 1 VIEW Result Date: 02/20/2023 CLINICAL DATA:  Shortness of breath, chest tube in place. EXAM: PORTABLE CHEST 1 VIEW COMPARISON:  February 19, 2023. FINDINGS: Stable cardiomediastinal silhouette. Left-sided  chest tube is unchanged in position. Stable probable left basilar hydropneumothorax. Stable right upper lobe opacity is noted concerning for possible pneumonia. Bony thorax is unremarkable. IMPRESSION: Stable position of left-sided chest tube with stable probable left basilar hydropneumothorax. Stable right upper lobe opacity concerning for possible pneumonia. Electronically Signed   By: Lupita Raider M.D.   On: 02/20/2023 11:24   DG CHEST PORT 1 VIEW Result Date: 02/19/2023 CLINICAL DATA:  63 year old male with history of chest tube. EXAM: PORTABLE CHEST 1 VIEW COMPARISON:  Chest x-ray 02/18/2023. FINDINGS: Small bore left-sided chest tube with tip projecting over the mid left hemithorax, similar to the prior study. Increasing lucency in the mid to lower left hemithorax concerning for probable loculated pneumothorax. There is also a fluid level in this region suggesting hydropneumothorax. Areas of interstitial prominence, peribronchial cuffing and patchy multifocal airspace disease most evident in the mid to upper lungs, slightly increased compared to the prior study. No right pleural effusion. No right pneumothorax. No evidence of pulmonary edema. Heart size appears normal. Upper mediastinal contours are within normal limits. IMPRESSION: 1. Increasing lucency in the mid to lower left hemithorax concerning for loculated hydropneumothorax, which has increased despite the presence of a left-sided chest tube. 2. Slight interval worsening of aeration in the mid to upper lungs bilaterally, concerning for worsening multifocal pneumonia. Electronically Signed   By: Trudie Reed M.D.   On: 02/19/2023 08:50   DG CHEST PORT 1 VIEW Result Date: 02/18/2023 CLINICAL DATA:  Chest tube EXAM: PORTABLE CHEST 1 VIEW COMPARISON:  X-ray 02/17/2023 and older FINDINGS: Stable pigtail catheter at the left lung base. Lateral small pneumothorax may be slightly larger today but this could be technical. Recommend continued  follow-up. Adjacent left mid lower lung parenchymal opacities are again seen with component of pleural fluid. Subtle right upper lung opacity is slightly more apparent today. No right-sided pneumothorax or effusion. Stable enlarged cardiopericardial silhouette. Stable left-sided PICC. Overlapping cardiac leads. IMPRESSION: Left-sided pigtail catheter with a lateral pneumothorax. The pneumothorax may be slightly larger today. Recommend continued follow-up. Persistent left lung opacity with left effusion. Slight increasing right mid upper lung opacity. Recommend follow-up Electronically Signed   By: Scarlette Shorts  Chales Abrahams M.D.   On: 02/18/2023 16:19   DG Chest Port 1 View Result Date: 02/17/2023 CLINICAL DATA:  Pneumothorax. EXAM: PORTABLE CHEST 1 VIEW COMPARISON:  Radiograph yesterday FINDINGS: Left upper extremity PICC remains in place. The more superior pigtail catheter on the left has been removed. The second pigtail catheter remains unchanged in position left basilar pneumothorax is not significantly changed, 16 mm from the chest wall laterally. Left hemithorax opacities peripherally and at the base are unchanged. Stable appearance of the right lung, mild ill-defined patchy opacity in the right upper lung zone. Stable heart size and mediastinal contours. IMPRESSION: 1. Interval removal of more superior pigtail catheter on the left. The second pigtail catheter remains unchanged in position. 2. Unchanged left basilar pneumothorax. 3. Unchanged left hemithorax opacities. Electronically Signed   By: Narda Rutherford M.D.   On: 02/17/2023 11:34   DG Chest Port 1 View Result Date: 02/16/2023 CLINICAL DATA:  [id number] Pleural effusion [id number] EXAM: PORTABLE CHEST 1 VIEW COMPARISON:  02/15/2023 FINDINGS: Left PICC line remains in place. Two pigtail pleural drainage catheters remain in place on the left. This small left basilar pneumothorax, slightly increased in size with pleural edge measuring 1.5 cm to the lateral chest wall  (previously approximately 0.8 cm). Persistent peripheral opacity in the left lung. No right-sided pneumothorax. Stable cardiomegaly. IMPRESSION: Small left basilar pneumothorax, slightly increased in size. Two pigtail pleural drainage catheters remain in place on the left. Electronically Signed   By: Duanne Guess D.O.   On: 02/16/2023 09:19   DG Chest Port 1 View Result Date: 02/15/2023 CLINICAL DATA:  Left-sided pleural effusion. EXAM: PORTABLE CHEST 1 VIEW COMPARISON:  Chest radiograph dated 02/14/2023. FINDINGS: Interval placement of a left-sided PICC with tip over central SVC. Similar positioning of 2 left-sided chest tubes. No significant interval change in the size of the left basilar pneumothorax and left lung opacities. Stable cardiac silhouette. No acute osseous pathology. IMPRESSION: 1. Interval placement of a left-sided PICC with tip over central SVC. 2. No significant interval change in the size of the left basilar pneumothorax and left lung opacities. Electronically Signed   By: Elgie Collard M.D.   On: 02/15/2023 09:47   CT CHEST WO CONTRAST Result Date: 02/14/2023 CLINICAL DATA:  Follow-up empyema.  Status post chest tube placement EXAM: CT CHEST WITHOUT CONTRAST TECHNIQUE: Multidetector CT imaging of the chest was performed following the standard protocol without IV contrast. RADIATION DOSE REDUCTION: This exam was performed according to the departmental dose-optimization program which includes automated exposure control, adjustment of the mA and/or kV according to patient size and/or use of iterative reconstruction technique. COMPARISON:  02/09/2023 FINDINGS: Cardiovascular: Heart size appears normal. Stable pericardial effusion, image 133/3. Aortic atherosclerosis. Coronary artery atherosclerotic calcifications. Mediastinum/Nodes: Thyroid gland, trachea appear normal. High density filling defect within the midthoracic esophagus measuring approximately 2.2 cm in length is noted in may  reflect an ingested pill fragment, image 105/7. Multiple prominent but non pathologically enlarged mediastinal lymph nodes are identified which likely reflects reactive changes. Lungs/Pleura: There is a posterior layering right pleural effusion which is increased in volume from previous exam, now moderate. Overall interval improvement in interstitial and airspace disease within the left lung. There is residual scattered areas of interstitial thickening and ground-glass attenuation is identified within the left lung. New patchy areas of interstitial thickening and ground-glass attenuation are also noted within the right upper lobe. Previous thick walled cavitary process within the anterolateral left upper lobe measures 4.8 x 3.7 by 4.3 cm,  image 88/4. The cavitary component of this mass directly communicates with the loculated hydropneumothorax, image 96/4. On the previous exam this measured 5.3 by 3.8 by 4.4 cm. The lateral approach, left chest tube is stable in position from 02/09/2020 with pigtail over the posterior left base. The volume of loculated fluid has decreased in volume when compared with the exam from 02/09/2023. There is a progressive, loculated ex vacuo pneumothorax overlying the lateral left mid and left lower lung. Anterior approach left chest tube with pigtail along the paramediastinal left upper lobe is new when compared with 02/09/2020. The underlying anteromedial loculated pleural fluid collection has nearly completely resolved in the interval. Within the epicardial fat there is a new area of loculated fluid anterior to the left side of heart measuring 4.7 by 1.8 by 5.7 cm, image 125/3. Upper Abdomen: No acute abnormality within the imaged portions of the upper abdomen. Musculoskeletal: There is mild edema extending along the left lateral chest wall. No acute or suspicious osseous findings. IMPRESSION: 1. Interval improvement in interstitial and airspace disease within the left lung. There are  residual scattered areas of interstitial thickening and ground-glass attenuation is identified within the left lung. Findings are compatible with improving multifocal pneumonia. 2. The lateral approach, left chest tube is stable in position from 02/09/2020 with pigtail over the posterior left base. The volume of loculated fluid has decreased in volume when compared with the exam from 02/09/2023. There is a progressive, loculated ex vacuo pneumothorax overlying the lateral left mid and left lower lung. 3. Anterior approach left chest tube with pigtail along the paramediastinal left upper lobe is new when compared with 02/09/2020. The underlying anteromedial loculated pleural fluid collection has nearly completely resolved in the interval. 4. Within the epicardial fat there is a new area of loculated fluid anterior to the left side of heart measuring 4.7 by 1.8 by 5.7 cm. 5. Posterior layering right pleural effusion is increased in volume from previous exam, now moderate. 6. New mild patchy areas of interstitial thickening and ground-glass attenuation within the right upper lobe, likely inflammatory/infectious. 7. Stable pericardial effusion. 8. High density filling defect within the midthoracic esophagus measuring approximately 2.2 cm in length is noted in may reflect an ingested pill fragment. 9.  Aortic Atherosclerosis (ICD10-I70.0). Electronically Signed   By: Signa Kell M.D.   On: 02/14/2023 20:39   DG Orthopantogram Result Date: 02/14/2023 CLINICAL DATA:  Poor dental hygiene and empyema. EXAM: ORTHOPANTOGRAM/PANORAMIC COMPARISON:  None Available. FINDINGS: Patient is edentulous of upper teeth. Dental carie of tooth 19, 20, 29, and 30. There may be a subtle periapical lucency about the dental caries of teeth 19 and 20. No frank bony destructive change. Postsurgical change in the left mandible plate and screw fixation. IMPRESSION: Dental caries of tooth 19, 20, 29, and 30. There may be a subtle periapical  lucency about the dental caries of teeth 19 and 20. Electronically Signed   By: Narda Rutherford M.D.   On: 02/14/2023 20:38   DG CHEST PORT 1 VIEW Result Date: 02/14/2023 CLINICAL DATA:  Chest tube placement EXAM: PORTABLE CHEST 1 VIEW COMPARISON:  X-ray 02/13/2023 and older FINDINGS: Stable placement of 2 left basilar chest tubes, pigtail catheters. Stable associated left mid lower lung opacity and small left basilar pneumothorax. No pneumothorax on the right. Persistent parenchymal opacities at the right lung base. Stable cardiopericardial silhouette with some volume loss on the left. No edema. Overlapping cardiac leads. IMPRESSION: No significant interval change. Electronically Signed  By: Karen Kays M.D.   On: 02/14/2023 12:13   [place] EKG SITE RITE Result Date: 02/14/2023 If Site Rite image not attached, placement could not be confirmed due to current cardiac rhythm.  DG CHEST PORT 1 VIEW Result Date: 02/13/2023 CLINICAL DATA:  Chest tube in place. EXAM: PORTABLE CHEST 1 VIEW COMPARISON:  Radiograph yesterday.  Chest CT 02/09/2023 FINDINGS: Two left-sided pigtail catheters are stable in positioning. Unchanged appearance of left basilar hydropneumothorax. Unchanged airspace disease throughout the left mid and lower lung zones. Small right pleural effusion which was better demonstrated on CT. Stable heart size and mediastinal contours. IMPRESSION: 1. Unchanged positioning of 2 left basilar pigtail catheters. Unchanged left basilar hydropneumothorax. 2. Unchanged left lung airspace disease. Electronically Signed   By: Narda Rutherford M.D.   On: 02/13/2023 17:46   DG Chest Port 1 View Result Date: 02/12/2023 CLINICAL DATA:  Empyema. EXAM: PORTABLE CHEST 1 VIEW COMPARISON:  02/11/2023 FINDINGS: Left pleural drains again noted with residual loculated antral basal left pneumothorax and consolidative airspace disease in the left lung, similar to prior. Patchy airspace disease at the right base is similar.  Cardiopericardial silhouette is at upper limits of normal for size. No acute bony abnormality. Telemetry leads overlie the chest. IMPRESSION: 1. No significant interval change. 2. Persistent loculated anterobasilar left pneumothorax with left pleural drains in place. 3. Persistent consolidative airspace disease in the left lung and patchy airspace disease at the right base. Electronically Signed   By: Kennith Center M.D.   On: 02/12/2023 10:18   DG Chest 1 View Result Date: 02/11/2023 CLINICAL DATA:  Left-sided pleural effusion EXAM: CHEST  1 VIEW COMPARISON:  X-ray and CT 02/09/2023 and older. FINDINGS: Placement of a second pigtail catheter adjacent to the first at the left lung base. Small left basilar pneumothorax is slightly increased. Persistent opacity at the left mid lower lung. Mild linear opacity at the right lung base is slightly increased from previous. Stable cardiopericardial silhouette. No pneumothorax. Overlapping cardiac leads. IMPRESSION: Placement of a second pigtail catheter at the left lung base. Slight increase in the small left pneumothorax. Persistent adjacent opacity. Prostate increase in presumed atelectasis at the right lung base. Electronically Signed   By: Karen Kays M.D.   On: 02/11/2023 11:09   CT PERC PLEURAL DRAIN W/INDWELL CATH W/IMG GUIDE Result Date: 02/10/2023 INDICATION: 63 year old male with history of complex empyema with loculated component in the anterior left chest. EXAM: CT PERC PLEURAL DRAIN W/INDWELL CATH W/IMG GUIDE COMPARISON:  None Available. MEDICATIONS: The patient is currently admitted to the hospital and receiving intravenous antibiotics. The antibiotics were administered within an appropriate time frame prior to the initiation of the procedure. ANESTHESIA/SEDATION: Moderate (conscious) sedation was employed during this procedure. A total of Versed mg and Fentanyl mcg was administered intravenously. Moderate Sedation Time: minutes. The patient's level of  consciousness and vital signs were monitored continuously by radiology nursing throughout the procedure under my direct supervision. CONTRAST:  None COMPLICATIONS: None immediate. PROCEDURE: RADIATION DOSE REDUCTION: This exam was performed according to the departmental dose-optimization program which includes automated exposure control, adjustment of the mA and/or kV according to patient size and/or use of iterative reconstruction technique. Informed written consent was obtained from the patient after a discussion of the risks, benefits and alternatives to treatment. The patient was placed supine on the CT gantry and a pre procedural CT was performed re-demonstrating the known fluid collection within the anterior left pleural space. The procedure was planned. A timeout  was performed prior to the initiation of the procedure. The left chest was prepped and draped in the usual sterile fashion. The overlying soft tissues were anesthetized with 1% lidocaine with epinephrine. Appropriate trajectory was planned with the use of a 22 gauge spinal needle. An 18 gauge trocar needle was advanced into the pleural space and a short Amplatz super stiff wire was coiled within the collection. Appropriate positioning was confirmed with a limited CT scan. The tract was serially dilated allowing placement of a 14 French all-purpose drainage catheter. Appropriate positioning was confirmed with a limited postprocedural CT scan. A total of approximately 100 ml of purulent fluid was aspirated. Samples were sent to the lab for fluid analysis. The tube was connected to a pleurovac and sutured in place. A dressing was placed. The patient tolerated the procedure well without immediate post procedural complication. IMPRESSION: Successful CT guided placement of a 78 French all purpose drain catheter into the left anterior loculated empyema with immediate aspiration of approximately 100 mL purulent fluid. Samples were sent to the laboratory as  requested by the ordering clinical team. Marliss Coots, MD [facility] Electronically Signed   By: Marliss Coots M.D.   On: 02/10/2023 10:34   DG CHEST PORT 1 VIEW Result Date: 02/09/2023 CLINICAL DATA:  63 year old male with left hydropneumothorax, loculation, empyema. Chest tube placement. EXAM: PORTABLE CHEST 1 VIEW COMPARISON:  Chest CT 1035 hours today reported separately. FINDINGS: Portable AP semi upright view at 0736 hours. Pigtail left chest tube appears stable since yesterday. Regression of the post drainage left lower lung pleural air since yesterday. Otherwise on going veiling and confluent left mid and lower lung opacity. See additional details on CT. Right lung ventilation not significantly changed. Mediastinal contour stable. Visualized tracheal air column is within normal limits. No acute osseous abnormality identified. IMPRESSION: 1. Regression of post drainage left lower pleural air since yesterday. Stable left chest tube. 2. Otherwise stable chest, see additional details on CT today reported just now. Electronically Signed   By: Odessa Fleming M.D.   On: 02/09/2023 11:01   CT CHEST WO CONTRAST Result Date: 02/09/2023 CLINICAL DATA:  63 year old male with left hydropneumothorax, loculation, empyema. Chest tube placement. EXAM: CT CHEST WITHOUT CONTRAST TECHNIQUE: Multidetector CT imaging of the chest was performed following the standard protocol without IV contrast. RADIATION DOSE REDUCTION: This exam was performed according to the departmental dose-optimization program which includes automated exposure control, adjustment of the mA and/or kV according to patient size and/or use of iterative reconstruction technique. COMPARISON:  Portable chest radiographs yesterday, CT 02/07/2023. FINDINGS: Cardiovascular: Vascular patency is not evaluated in the absence of IV contrast. Heart size remains within normal limits. There is a small pericardial  effusion now suspected on series 3, image 132. Mild Calcified aortic atherosclerosis. Mediastinum/Nodes: Abnormal medial left anterior lung/pleural collection inseparable from the mediastinum including along the main pulmonary artery (series 3, image 93) further detailed below. No separate mediastinal mass. Mediastinal lymph nodes appear stable and reactive. But furthermore, there is moderate epicardial and superior diaphragmatic soft tissue stranding near the cardiac apex on series 3, image 126. Lungs/Pleura: Oval loculated lung or pleural collection along the anterior upper lobe contiguous with the mediastinum has trace internal gas (series 3, image 93 and otherwise measures fluid density encompassing 78 x 48 by 112 mm now (AP by transverse by CC) for an estimated volume of 200 mL (coronal image 99) this appears slightly larger since 02/07/2023. Interval subtotal drainage  of the dominant loculated left lung or pleural collection seen previously with left lower lung pigtail drainage catheter now terminating on series 3, image 136. However, ongoing extensive left perihilar and lower lung consolidation there. Small loculated left pneumothorax or residual empyema along the lung periphery such as series 3, image 81. Overall no significant improvement in left lung ventilation since the prior CT. Central airways remain patent. New small layering right pleural effusion (series 3, image 131). Increased patchy right lower lobe adjacent lung opacity with redemonstrated 14 mm lung nodule on series 4, image 116. Right upper lobe and right middle lobe are stable. Upper Abdomen: Negative visible noncontrast liver, spleen, pancreas, bowel in the upper abdomen. Stable visible adrenal glands and kidneys. Musculoskeletal: No acute or suspicious osseous lesion identified. Left chest wall edema, trace soft tissue gas following chest tube placement. IMPRESSION: 1. Pigtail left lower lung pleural catheter in place with substantial  drainage of the dominant pleural collection/empyema, but extensive ongoing left mid and lower lung Consolidation such that no significant improvement in left lung ventilation. 2. Separate loculated and un-drained left anterior chest/pleural collection/empyema which is inseparable from the mediastinum (see #3) and has an estimated volume of 200 mL (coronal image 99). 3. Small pericardial effusion is apparent now, with epicardial inflammatory stranding near the cardiac apex. Secondary Infectious Pericarditis not excluded. Reactive appearing mediastinal lymph nodes. 4. Small new layering right pleural effusion. Increasing right lower lobe opacity which could be atelectasis or infection. Electronically Signed   By: Odessa Fleming M.D.   On: 02/09/2023 10:59   ECHOCARDIOGRAM COMPLETE Result Date: 02/08/2023    ECHOCARDIOGRAM REPORT   Patient Name:   DEMETRIOUS RAINFORD Date of Exam: 02/08/2023 Medical Rec #:  [id number]    Height:       72.0 in Accession #:    [id number]   Weight:       149.9 lb Date of Birth:  May 19, 1960     BSA:          1.885 m Patient Age:    62 years     BP:           106/69 mmHg Patient Gender: M            HR:           75 bpm. Exam Location:  Inpatient Procedure: 2D Echo, Color Doppler and Cardiac Doppler Indications:    atrial fibrillation  History:        Patient has no prior history of Echocardiogram examinations.                 COPD and Sepsis; Risk Factors:Current Smoker.  Sonographer:    Delcie Roch RDCS Referring Phys: [id number] DANIEL R BENSIMHON IMPRESSIONS  1. Left ventricular ejection fraction, by estimation, is 45 to 50%. The left ventricle has mildly decreased function. The left ventricle demonstrates global hypokinesis. Left ventricular diastolic parameters were normal. The average left ventricular global longitudinal strain is -12.9 %. The global longitudinal strain is abnormal.  2. Right ventricular systolic function is normal. The right ventricular size is mildly enlarged. Tricuspid  regurgitation signal is inadequate for assessing PA pressure.  3. There is a small pericardial effusion, mostly around the LV apex and lateral wall, in the immediate vicinity of an area of complex loculated small left pleural effusion. There appears to be exaggerated respiratory displacement of the ventricular septum, suggesting ventricular interdependence. Consider a component of constrictive physiology. a small pericardial effusion is present. There is no  evidence of cardiac tamponade.  4. The mitral valve is normal in structure. Trivial mitral valve regurgitation. No evidence of mitral stenosis.  5. The aortic valve is tricuspid. Aortic valve regurgitation is not visualized. No aortic stenosis is present.  6. The inferior vena cava is dilated in size with >50% respiratory variability, suggesting right atrial pressure of 8 mmHg. FINDINGS  Left Ventricle: Left ventricular ejection fraction, by estimation, is 45 to 50%. The left ventricle has mildly decreased function. The left ventricle demonstrates global hypokinesis. The average left ventricular global longitudinal strain is -12.9 %. The global longitudinal strain is abnormal. The left ventricular internal cavity size was normal in size. There is no left ventricular hypertrophy. Left ventricular diastolic parameters were normal. Normal left ventricular filling pressure. Right Ventricle: The right ventricular size is mildly enlarged. No increase in right ventricular wall thickness. Right ventricular systolic function is normal. Tricuspid regurgitation signal is inadequate for assessing PA pressure. Left Atrium: Left atrial size was normal in size. Right Atrium: Right atrial size was normal in size. Pericardium: There is a small pericardial effusion, mostly around the LV apex and lateral wall, in the immediate vicinity of an area of complex loculated small left pleural effusion. There appears to be exaggerated respiratory displacement of the ventricular septum,  suggesting ventricular interdependence. Consider a component of constrictive physiology. A small pericardial effusion is present. There is no evidence of cardiac tamponade. Mitral Valve: The mitral valve is normal in structure. Trivial mitral valve regurgitation. No evidence of mitral valve stenosis. Tricuspid Valve: The tricuspid valve is normal in structure. Tricuspid valve regurgitation is not demonstrated. Aortic Valve: The aortic valve is tricuspid. Aortic valve regurgitation is not visualized. No aortic stenosis is present. Pulmonic Valve: The pulmonic valve was grossly normal. Pulmonic valve regurgitation is not visualized. No evidence of pulmonic stenosis. Aorta: The aortic root and ascending aorta are structurally normal, with no evidence of dilitation. Venous: The inferior vena cava is dilated in size with greater than 50% respiratory variability, suggesting right atrial pressure of 8 mmHg. IAS/Shunts: No atrial level shunt detected by color flow Doppler.  LEFT VENTRICLE PLAX 2D LVIDd:         4.30 cm   Diastology LVIDs:         3.40 cm   LV e' medial:    8.59 cm/s LV PW:         1.00 cm   LV E/e' medial:  9.0 LV IVS:        0.90 cm   LV e' lateral:   8.81 cm/s LVOT diam:     2.20 cm   LV E/e' lateral: 8.8 LV SV:         71 LV SV Index:   38        2D Longitudinal Strain LVOT Area:     3.80 cm  2D Strain GLS Avg:     -12.9 %  RIGHT VENTRICLE             IVC RV Basal diam:  3.00 cm     IVC diam: 2.30 cm RV S prime:     13.10 cm/s TAPSE (M-mode): 1.8 cm LEFT ATRIUM             Index        RIGHT ATRIUM           Index LA diam:        2.60 cm 1.38 cm/m   RA Area:     16.70  cm LA Vol (A2C):   61.9 ml 32.84 ml/m  RA Volume:   47.30 ml  25.09 ml/m LA Vol (A4C):   50.7 ml 26.90 ml/m LA Biplane Vol: 57.6 ml 30.56 ml/m  AORTIC VALVE LVOT Vmax:   106.00 cm/s LVOT Vmean:  66.100 cm/s LVOT VTI:    0.187 m  AORTA Ao Root diam: 3.50 cm Ao Asc diam:  3.40 cm MITRAL VALVE MV Area (PHT): 3.85 cm    SHUNTS MV Decel  Time: 197 msec    Systemic VTI:  0.19 m MV E velocity: 77.70 cm/s  Systemic Diam: 2.20 cm MV A velocity: 79.30 cm/s MV E/A ratio:  0.98 Mihai Croitoru MD Electronically signed by Thurmon Fair MD Signature Date/Time: 02/08/2023/4:53:23 PM    Final    DG CHEST PORT 1 VIEW Result Date: 02/08/2023 CLINICAL DATA:  63 year old male with left hydropneumothorax, loculation, suspicious for empyema. Chest tube placement. EXAM: PORTABLE CHEST 1 VIEW COMPARISON:  CT chest 1819 hours yesterday. Portable chest 0327 hours today. FINDINGS: Portable AP semi upright view at 1055 hours. Pigtail left lower chest tube now in place. Decreased left lung base opacification but continued left lower lung volume loss with rind like thickening and small volume loculated left lung base gas/pneumothorax now. Ongoing confluent and indistinct left perihilar opacity with some air bronchograms. Ongoing left hemithorax volume loss with some leftward mediastinal shift. Right lung appears stable, negative. Visualized tracheal air column is within normal limits. Stable visualized osseous structures. Negative bowel gas. IMPRESSION: 1. Left lower pigtail chest tube placed with evidence of some loculated fluid drainage, but poor lung inflation and associated left lower pleural air now. Ongoing left perihilar consolidation and opacity. 2. Stable right lung. Electronically Signed   By: Odessa Fleming M.D.   On: 02/08/2023 11:30   DG CHEST PORT 1 VIEW Result Date: 02/08/2023 CLINICAL DATA:  [id number] with left chest empyema and shortness of breath. EXAM: PORTABLE CHEST 1 VIEW COMPARISON:  Chest CT yesterday at 6:17 p.m. FINDINGS: 3:27 a.m. Large air fluid collections in the left hemithorax are noted obscuring the left lung from the level of the aortic knob down. The partially aerated left upper lung field demonstrates increased airspace disease concerning for worsening pneumonia. Nodular airspace disease in the right lower lung field was better seen on CT, but  appeared consistent with bronchopneumonia. Remainder of the lungs are clear with emphysematous changes. The cardiac size is normal. The mediastinal configuration is normal. Thoracic cage is intact. IMPRESSION: 1. Large air fluid collections in the left hemithorax obscuring the left lung from the level of the aortic knob down. 2. Increased airspace disease in the partially aerated left upper lung field concerning for worsening pneumonia. 3. Nodular airspace disease in the right lower lung field was better seen on CT, but appeared consistent with bronchopneumonia. Electronically Signed   By: Almira Bar M.D.   On: 02/08/2023 06:02   CT CHEST ABDOMEN PELVIS W CONTRAST Result Date: 02/07/2023 CLINICAL DATA:  Shortness of breath, tachycardia, sepsis. Large hydropneumothorax concerning for empyema. Right upper quadrant pain. EXAM: CT CHEST, ABDOMEN, AND PELVIS WITH CONTRAST TECHNIQUE: Multidetector CT imaging of the chest, abdomen and pelvis was performed following the standard protocol during bolus administration of intravenous contrast. RADIATION DOSE REDUCTION: This exam was performed according to the departmental dose-optimization program which includes automated exposure control, adjustment of the mA and/or kV according to patient size and/or use of iterative reconstruction technique. CONTRAST:  OMNIPAQUE IOHEXOL 300 MG/ML  SOLN COMPARISON:  Chest x-ray today FINDINGS: CT CHEST FINDINGS Cardiovascular: Heart is normal size. Aorta is normal caliber. Mediastinum/Nodes: Small scattered mediastinal lymph nodes, none pathologically enlarged. No mediastinal, hilar, or axillary adenopathy. Trachea and esophagus are unremarkable. Thyroid unremarkable. Lungs/Pleura: Mild centrilobular emphysema. Large air and fluid collection noted in the left hemithorax compressing the left lung, measuring 19 x 18 x 10 cm. Separate fluid collection noted medially along the anterior mediastinum measuring 11 x 6.4 x 5.5 cm.  Airspace disease in the adjacent left lung. Clustered nodular airspace disease in the right lower lobe with largest nodule measuring up to 1.4 cm. There is airway thickening in the right lower lobe. No effusion on the right. Musculoskeletal: No acute bony abnormality. CT ABDOMEN PELVIS FINDINGS Hepatobiliary: No focal hepatic abnormality. Gallbladder unremarkable. Pancreas: No focal abnormality or ductal dilatation. Spleen: Small low-density area anteriorly in the spleen, likely small cyst. Normal size. No suspicious abnormality. Adrenals/Urinary Tract: Left adrenal nodule measures 1.2 cm and is stable since 2023 most compatible with adenoma. Right adrenal gland and kidneys unremarkable. No stones or hydronephrosis. Urinary bladder unremarkable. Stomach/Bowel: Stomach, large and small bowel grossly unremarkable. Vascular/Lymphatic: Aortoiliac atherosclerosis. No evidence of aneurysm or adenopathy. Reproductive: No visible focal abnormality. Other: No free fluid or free air. Musculoskeletal: No acute bony abnormality. IMPRESSION: Large air and fluid collections noted in the left chest as measured and described above. It is difficult to determine if these reflect pleural collections and thus hydropneumothorax and empyema or reflect pulmonary collections and abscesses. Compression of the underlying left lung noted in the left lower lobe with patchy airspace disease adjacent in the left upper lobe. Clustered nodular airspace disease in the right lower lobe with airway thickening. This likely reflects bronchopneumonia. No acute findings in the abdomen or pelvis. Emphysema. Electronically Signed   By: Charlett Nose M.D.   On: 02/07/2023 18:43   DG Chest 2 View Result Date: 02/07/2023 CLINICAL DATA:  Cough. EXAM: CHEST - 2 VIEW COMPARISON:  None Available. FINDINGS: AP projection demonstrates dense opacification of the LEFT floor lobe with complete obscuration of the LEFT heart. There is a long air-fluid level within the  LEFT lower lobe measuring 13 cm on lateral projection. Findings consistent with hydropneumothorax. RIGHT lung is clear. IMPRESSION: 1. Large hydropneumothorax in the LEFT lower lobe with long air-fluid level. Findings are concerning for infection or neoplasm of the LEFT lower lobe with accompanying empyema or pulmonary abscess. 2. Recommend CT thorax with contrast for further evaluation. Electronically Signed   By: Genevive Bi M.D.   On: 02/07/2023 16:36    Microbiology: No results found for this or any previous visit (from the past 240 hours).   Labs: Basic Metabolic Panel: Recent Labs  Lab 03/01/23 0630 03/03/23 0631 03/06/23 0538 03/07/23 0240  NA 135 135 138 139  K 4.5 4.6 3.7 4.0  CL 99 91* 98 103  CO2 29 31 30 27   GLUCOSE 137* 164* 110* 153*  BUN 14 16 18  24*  CREATININE 0.89 0.83 0.72 0.84  CALCIUM 9.2 10.1 9.7 9.2   Liver Function Tests: No results for input(s): "AST", "ALT", "ALKPHOS", "BILITOT", "PROT", "ALBUMIN" in the last 168 hours. No results for input(s): "LIPASE", "AMYLASE" in the last 168 hours. No results for input(s): "AMMONIA" in the last 168 hours. CBC: Recent Labs  Lab 03/01/23 0615 03/02/23 0612 03/03/23 0631 03/06/23 0538 03/07/23 0240  WBC 8.5 9.5 8.5 9.2 8.7  HGB 10.4* 10.9* 10.8* 10.9* 10.5*  HCT 33.3* 34.5* 35.1* 34.9* 32.6*  MCV 95.7 95.0 96.2 95.6 95.6  PLT 499* 497* 481* 417* 328   Cardiac Enzymes: No results for input(s): "CKTOTAL", "CKMB", "CKMBINDEX", "TROPONINI" in the last 168 hours. BNP: BNP (last 3 results) No results for input(s): "BNP" in the last 8760 hours.  ProBNP (last 3 results) No results for input(s): "PROBNP" in the last 8760 hours.  CBG: No results for input(s): "GLUCAP" in the last 168 hours.     Signed:  Zannie Cove MD.  [facility] 03/07/2023, 9:27 AM

## 2023-03-07 NOTE — Progress Notes (Signed)
   MATCH MEDICATION ASSISTANCE CARD   Pharmacies please call [phone] for claim processing assistance.   Rx BIN: [id number]   Rx Group: [id number]   Rx PCN: [id number]   Relationship Code: 1   Person Code: [id number]   Patient ID (MRN): [id number]       Patient Name: Mathew Wyatt    Patient DOB: [birth year]    Discharge Date: 03/07/2023   Expiration Date: 03/15/2023 (must be filled within 7 days of discharge)      Dear   Bonita Quin have been approved to have the prescriptions written by your discharging physician filled through our [facility] (Medication Assistance Through [facility]) program. This program allows for a one-time (no refills) 34-day supply of selected medications for a low copay amount.      The copay is $3.00 per prescription. For instance, if you have one prescription, you will pay $3.00; for two prescriptions, you pay $6.00; for three prescriptions, you pay   $9.00; and so on.   Only certain pharmacies are participating in this program with [facility]. You will need to select one of the pharmacies from the attached lists and take your prescriptions, this letter, and your photo ID to one of the participating pharmacies.      We are excited that you are able to use the [facility] program to get your medications. These prescriptions must be filled within 7 days of hospital discharge or they will no longer be valid for the [facility] program. Should you have any problems with your prescriptions please contact your case management team member at [phone] for [facility] or [phone] for [facility].      Thank you, [facility]

## 2023-03-07 NOTE — TOC Transition Note (Signed)
 Transition of Care [facility]) - Discharge Note   Patient Details  Name: Mathew Wyatt MRN: [id number] Date of Birth: 1960/02/01  Transition of Care [facility] CM/SW Contact:  Harriet Masson, RN Phone Number: 03/07/2023, 11:40 AM   Clinical Narrative: Patient stable for discharge.  MATCH letter done for discharge medicines.  Family will transport home.       Final next level of care: Home/Self Care Barriers to Discharge: Barriers Resolved   Patient Goals and CMS Choice Patient states their goals for this hospitalization and ongoing recovery are:: wants to get better          Discharge Placement               home        Discharge Plan and Services Additional resources added to the After Visit Summary for     Discharge Planning Services: CM Consult                                 Social Drivers of Health (SDOH) Interventions SDOH Screenings   Food Insecurity: No Food Insecurity (02/12/2023)  Housing: Low Risk  (02/12/2023)  Transportation Needs: No Transportation Needs (02/12/2023)  Utilities: Not At Risk (02/12/2023)  Tobacco Use: High Risk (02/08/2023)     Readmission Risk Interventions    03/07/2023   11:40 AM 03/07/2023   11:39 AM  Readmission Risk Prevention Plan  Transportation Screening  Complete  PCP or Specialist Appt within 5-7 Days  Not Complete  Not Complete comments apt 2/28   Home Care Screening  Complete  Medication Review (RN CM)  Complete

## 2023-03-07 NOTE — Progress Notes (Signed)
 Mobility Specialist Progress Note;   03/07/23 0907  Mobility  Activity Ambulated independently in hallway  Level of Assistance Standby assist, set-up cues, supervision of patient - no hands on  Assistive Device None  Distance Ambulated (ft) 150 ft  Activity Response Tolerated well  Mobility Referral Yes  Mobility visit 1 Mobility  Mobility Specialist Start Time (ACUTE ONLY) 0907  Mobility Specialist Stop Time (ACUTE ONLY) 0914  Mobility Specialist Time Calculation (min) (ACUTE ONLY) 7 min   Pt agreeable to mobility. Required no physical assistance during ambulation, SV. Ambulated on RA, VSS. No c/o throughout session. Pt returned back to bed with all needs met.   Caesar Bookman Mobility Specialist Please contact via SecureChat or [employer] [phone]

## 2023-03-07 NOTE — Progress Notes (Addendum)
      [address]       [place] [address]             [phone]       12 Days Post-Op Procedure(s) (LRB): VIDEO ASSISTED THORACOSCOPY (VATS)/DECORTICATION (Left)  Subjective: Patient feeling well and wants to go home.  Objective: Vital signs in last 24 hours: Temp:  [97.6 F (36.4 C)-98.1 F (36.7 C)] 97.8 F (36.6 C) (02/17 0239) Pulse Rate:  [83-85] 83 (02/17 0239) Cardiac Rhythm: Normal sinus rhythm (02/16 1932) Resp:  [14-17] 16 (02/17 0239) BP: (81-103)/(57-62) 93/62 (02/17 0239) SpO2:  [93 %-94 %] 93 % (02/17 0239) Weight:  [70.1 kg] 70.1 kg (02/17 0411)     Intake/Output from previous day: 02/16 0701 - 02/17 0700 In: 600 [P.O.:600] Out: 0    Physical Exam:  Cardiovascular: RRR Pulmonary: Clear to auscultation on the right and slightly diminished left lateral/base Abdomen: Soft, non tender, bowel sounds present. Extremities: No lower extremity edema. Wounds: Chest tube dressing is  dry.   Lab Results: CBC: Recent Labs    03/06/23 0538 03/07/23 0240  WBC 9.2 8.7  HGB 10.9* 10.5*  HCT 34.9* 32.6*  PLT 417* 328   BMET:  Recent Labs    03/06/23 0538 03/07/23 0240  NA 138 139  K 3.7 4.0  CL 98 103  CO2 30 27  GLUCOSE 110* 153*  BUN 18 24*  CREATININE 0.72 0.84  CALCIUM 9.7 9.2    PT/INR: No results for input(s): "LABPROT", "INR" in the last 72 hours. ABG:  INR: Will add last result for INR, ABG once components are confirmed Will add last 4 CBG results once components are confirmed  Assessment/Plan:  1. CV - Previous a fib with RVR. He has been maintaining SR for awhile. On Lopressor 12.5 mg bid, Amiodarone 200 mg daily and Apixaban 5 mg bid. Also, on Spironolactone 12.5 mg daily. Per medicine 2.  Pulmonary - History of COPD-On room air. PA/LAT CXR this am appears stable (left lateral hydropneumothorax). Encourage incentive spirometer and flutter valve. 3. ID-On Augmentin for PNA/empyema for Campylobacter and  Streptococcus constellatus empyema. 4. Will change follow up to 02/28.  Donielle M ZimmermanPA-C 03/07/2023,7:16 AM  Agree with above Please call with questions.  Mikele Sifuentes Keane Scrape

## 2023-03-09 LAB — FUNGUS CULTURE WITH STAIN

## 2023-03-09 LAB — FUNGAL ORGANISM REFLEX

## 2023-03-09 LAB — FUNGUS CULTURE RESULT

## 2023-03-11 ENCOUNTER — Ambulatory Visit: Payer: Self-pay | Admitting: Thoracic Surgery (Cardiothoracic Vascular Surgery)

## 2023-03-18 ENCOUNTER — Ambulatory Visit: Payer: Self-pay | Admitting: Thoracic Surgery (Cardiothoracic Vascular Surgery)

## 2023-03-22 NOTE — Progress Notes (Deleted)
  Cardiology Office Note   Date:  03/22/2023  ID:  Mathew Wyatt, DOB 1960/04/09, MRN [id number] PCP:  Patient, No Pcp Per [facility] Cardiologist: None  Reason for visit: Hospital follow-up, new to outpatient cards   History of Present Illness    Mathew Wyatt is a 63 y.o. male with a hx of COPD, tobacco use who initially presented to [facility] on February 07, 2023 with shortness of breath and productive cough.  With concern of empyema, he was transferred to [facility].  Patient went into A-fib with RVR and hypotension was admitted to the ICU.  Started on amiodarone.  Chest tube placed with 2 L of purulent output.  Echo showed no vegetation, EF 45 to 50%.  Lung cultures showed Campylobacter and Peptostreptococcus, antibiotics tailored.  Patient again had A-fib with RVR.  Treated with beta-blocker.  He underwent VATS and decortication of left empyema with Dr. Cliffton Asters. Repeat cultures with Streptococcus constellatus, pansensitive.    Patient converted to normal sinus rhythm.  Discharged on Eliquis.  Hemoglobin stabilized after transfusion.  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  Initially required IV Lasix, no diuretics on discharge.  Medications included amiodarone 200 mg once daily, Eliquis 5 mg twice daily, metoprolol tartrate 12.5 mg twice daily and spironolactone 12.5 mg daily.  Today, ***  Atrial fibrillation -In the setting of empyema/sepsis -***  Acute systolic heart failure Tachycardia induced cardiomyopathy -Echo February 08, 2023 EF 45 to 50% in the setting of A-fib with RVR -***  Hypertension -*** -Goal BP is <130/80.  Recommend DASH diet (high in vegetables, fruits, low-fat dairy products, whole grains, poultry, fish, and nuts and low in sweets, sugar-sweetened beverages, and red meats), salt restriction and increase physical activity.  Hyperlipidemia -*** -Recommend cholesterol lowering diets - Mediterranean diet, DASH diet, vegetarian diet,  low-carbohydrate diet and avoidance of trans fats.  Discussed healthier choice substitutes.  Nuts, high-fiber foods, and fiber supplements may also improve lipids.    Obesity -Even a 5-10% weight loss can have cardiovascular benefits.   -Recommend moderate intensity activity for 30 minutes 5 days/week and the DASH diet.  Tobacco use  -Recommend tobacco cessation.  Reviewed physiologic effects of nicotine and the immediate-eventual benefits of quitting including improvement in cough/breathing and reduction in cardiovascular events.  Discussed quitting tips such as removing triggers and getting support from family/friends and Quitline [place]. -USPSTF recommends one-time screening for abdominal aortic aneurysm (AAA) by ultrasound in men 85 -80 years old who have ever smoked.      Disposition - Follow-up in ***    Objective / Physical Exam   EKG today: ***  Vital signs:  There were no vitals taken for this visit.    GEN: No acute distress NECK: No carotid bruits CARDIAC: ***RRR, no murmurs RESPIRATORY:  Clear to auscultation without rales, wheezing or rhonchi  EXTREMITIES: No edema  Assessment and Plan   ***   {Are you ordering a CV Procedure (e.g. stress test, cath, DCCV, TEE, etc)?   Press F2        :[id number]}    Signed, Bernette Mayers  03/22/2023 [facility]

## 2023-03-23 ENCOUNTER — Ambulatory Visit: Admitting: Student

## 2023-03-23 ENCOUNTER — Ambulatory Visit: Payer: MEDICAID | Admitting: Physician Assistant

## 2023-03-23 DIAGNOSIS — I4891 Unspecified atrial fibrillation: Secondary | ICD-10-CM

## 2023-03-23 DIAGNOSIS — I5021 Acute systolic (congestive) heart failure: Secondary | ICD-10-CM

## 2023-03-23 DIAGNOSIS — I43 Cardiomyopathy in diseases classified elsewhere: Secondary | ICD-10-CM

## 2023-03-25 ENCOUNTER — Encounter: Payer: Self-pay | Admitting: Thoracic Surgery (Cardiothoracic Vascular Surgery)

## 2023-03-25 ENCOUNTER — Ambulatory Visit: Payer: Self-pay | Admitting: Thoracic Surgery (Cardiothoracic Vascular Surgery)

## 2023-03-25 VITALS — BP 113/73 | HR 80 | Resp 18 | Ht 72.0 in | Wt 166.0 lb

## 2023-03-25 DIAGNOSIS — J869 Pyothorax without fistula: Secondary | ICD-10-CM

## 2023-03-25 DIAGNOSIS — Z09 Encounter for follow-up examination after completed treatment for conditions other than malignant neoplasm: Secondary | ICD-10-CM

## 2023-03-25 NOTE — Progress Notes (Signed)
      [address]       [place] [address]             [phone]        Camryn Quesinberry [facility] Medical Record #[id number] Date of Birth: [birth year]  Referring: Zannie Cove, MD Primary Care: Patient, No Pcp Per Primary Cardiologist:None  Reason for visit:   follow-up  History of Present Illness:     63 year old male presents for their first follow-up appointment.  Overall, he is doing well.    Physical Exam: BP 113/73 (BP Location: Right Arm)   Pulse 80   Resp 18   Ht 6' (1.829 m)   Wt 166 lb (75.3 kg)   SpO2 90% Comment: RA  BMI 22.51 kg/m   Alert NAD Incision clean.   Abdomen, ND no peripheral edema   Diagnostic Studies & Laboratory data:     Assessment / Plan:   63 year old male s/p left VATS decortication for empyema.  I we will see him back with a 1 month follow-up with a CXR.     Corliss Skains 03/25/2023 3:04 PM

## 2023-03-30 ENCOUNTER — Inpatient Hospital Stay: Payer: Self-pay | Admitting: Internal Medicine

## 2023-04-25 ENCOUNTER — Other Ambulatory Visit: Payer: Self-pay | Admitting: Thoracic Surgery (Cardiothoracic Vascular Surgery)

## 2023-04-25 DIAGNOSIS — J869 Pyothorax without fistula: Secondary | ICD-10-CM

## 2023-04-27 ENCOUNTER — Ambulatory Visit: Admitting: Internal Medicine

## 2023-04-29 ENCOUNTER — Ambulatory Visit: Attending: Cardiovascular Disease | Admitting: Cardiovascular Disease

## 2023-04-29 ENCOUNTER — Ambulatory Visit: Payer: Self-pay | Admitting: Thoracic Surgery (Cardiothoracic Vascular Surgery)

## 2023-04-29 ENCOUNTER — Encounter: Payer: Self-pay | Admitting: Cardiovascular Disease

## 2023-04-29 VITALS — BP 130/80 | HR 68 | Ht 72.0 in | Wt 170.5 lb

## 2023-04-29 DIAGNOSIS — I5033 Acute on chronic diastolic (congestive) heart failure: Secondary | ICD-10-CM | POA: Diagnosis not present

## 2023-04-29 DIAGNOSIS — I4891 Unspecified atrial fibrillation: Secondary | ICD-10-CM

## 2023-04-29 NOTE — Progress Notes (Signed)
 Cardiology Office Note  Date:  04/29/2023   ID:  Mathew Wyatt, DOB 1960/02/09, MRN [id number]  PCP:  Patient, No Pcp Per   Chief Complaint  Patient presents with   New Patient (Initial Visit)    Establish care for A-Fib with RVR; was seen at [facility] then transferred to [facility] on 02/08/2023.  "Doing well."      HPI:  Mathew Wyatt is a 63 year old gentleman with past medical history of Atrial fibrillation COPD/smoker Chronic diastolic CHF Who presents by referral from Dr. Jomarie Longs for atrial fibrillation  Recent hospital records reviewed February 2025 presented to [facility] hospital on 02/07/2023 with shortness of breath dyspnea on exertion with productive cough.  started on IV antibiotic.   CT scan of the chest concerning for empyema versus abscess,  transferred to [facility] on 02/08/2023   went into A-fib with RVR and hypotension  1/21: TCTS defer VATS. TX to  ICU for afib rvr hypotension, started on amiodarone. Chest tube placed with immediate 2L purulent output.   ECHO neg for vegetation, EF 45-50% 1/23 new chest tube placed by IR, cultures growing Campylobacter and Peptostreptococcus 1/26 dec'd pleural output but has persistent loculated fluid collection  1/27 AF w/ RVR. Treated w/ BB. Seen by ID; vanc stopped, switched to ceftriaxone, flagyl and azith for campylobacter. CT chest : anterior fluid collection has improved. The lateral effusion vol is less but loculated air maintains.  Right layering effusion, and new patchy areas of GG changes. Some mild dec in left GG changes. Repeated round 4 pleural lytics (administered in right lateral tube)  1/28: lateral CT 120 net neg  1/30 no significant change after 7 doses of pleural lytic therapy.  -1/30, seen by Dr. Cliffton Asters, patient refused VATS -1/31: Changed his mind, agreed to surgery,  -2/1: PCCM restarted Heparin gtt -2/3, hemoglobin down to 6.0, heparin held, transfused -2/4, reevaluated by Dr. Cliffton Asters, plan for VATS -2/5  underwent VATS and decortication of left empyema, Dr. Cliffton Asters -2/8, persistent air leak, chest tube to waterseal, antibiotics changed to PO -2/10 persistent small left base pneumothorax -2/12, IV heparin changed to DOAC -2/14: clamping CT> clamped for persistent air leak -2/15: Repeat clamping trial  -2/16: Chest tube removed Converted to normal sinus rhythm during his hospital course, discharged on Eliquis  Echo with ejection fraction 45 to 50%, initially treated with Lasix  In follow-up today he reports doing well Feels he is made a relatively complete recovery Reports he is trying to quit smoking Reports he stopped all of his medications when the prescription ran out Currently on no meds  Denies any tachycardia or palpitations concerning for atrial fibrillation  Some SOB going up hills  EKG personally reviewed by myself on todays visit EKG Interpretation Date/Time:  Friday April 29 2023 12:05:36 EDT Ventricular Rate:  68 PR Interval:  174 QRS Duration:  84 QT Interval:  430 QTC Calculation: 457 R Axis:   88  Text Interpretation: Normal sinus rhythm Normal ECG When compared with ECG of 16-Feb-2023 13:16, Sinus rhythm has replaced Atrial fibrillation Vent. rate has decreased BY  52 BPM ST no longer depressed in Anterior leads Confirmed by Julien Nordmann [phone]) on 04/29/2023 12:18:30 PM    PMH:   has no past medical history on file.  PSH:    Past Surgical History:  Procedure Laterality Date   HERNIA REPAIR     MANDIBLE SURGERY     VIDEO ASSISTED THORACOSCOPY (VATS)/DECORTICATION Left 02/23/2023   Procedure: VIDEO ASSISTED THORACOSCOPY (  VATS)/DECORTICATION;  Surgeon: Corliss Skains, MD;  Location: [facility] OR;  Service: Thoracic;  Laterality: Left;   He reports he is not currently taking any the medications listed below Current Outpatient Medications  Medication Sig Dispense Refill   acetaminophen (TYLENOL) 325 MG tablet Take 2 tablets (650 mg total) by mouth every 6  (six) hours as needed for mild pain (pain score 1-3) (or Fever >/= 101).     amiodarone (PACERONE) 200 MG tablet Take 1 tablet (200 mg total) by mouth daily. 30 tablet 1   aspirin EC 325 MG tablet Take 325 mg by mouth daily.     gabapentin (NEURONTIN) 300 MG capsule Take 1 capsule (300 mg total) by mouth 2 (two) times daily. 60 capsule 1   hydrOXYzine (ATARAX) 10 MG tablet Take 1 tablet (10 mg total) by mouth 3 (three) times daily as needed for anxiety. 20 tablet 0   melatonin 5 MG TABS Take 1 tablet (5 mg total) by mouth at bedtime. 30 tablet 0   metoprolol tartrate (LOPRESSOR) 25 MG tablet Take one-half tablet (12.5 mg total) by mouth 2 (two) times daily. 60 tablet 1   nicotine (NICODERM CQ - DOSED IN MG/24 HOURS) 21 mg/24hr patch Place 21 mg onto the skin daily.     oxyCODONE (OXY IR/ROXICODONE) 5 MG immediate release tablet Take 1 tablet (5 mg total) by mouth every 6 (six) hours as needed for moderate pain (pain score 4-6) or severe pain (pain score 7-10). 30 tablet 0   pantoprazole (PROTONIX) 40 MG tablet Take 1 tablet (40 mg total) by mouth daily. 30 tablet 0   polyethylene glycol powder (GLYCOLAX/MIRALAX) 17 GM/SCOOP powder Take 17 g by mouth daily as needed for moderate constipation. 238 g 0   spironolactone (ALDACTONE) 25 MG tablet Take one-half tablet (12.5 mg total) by mouth daily. 30 tablet 1   apixaban (ELIQUIS) 5 MG TABS tablet Take 1 tablet (5 mg total) by mouth 2 (two) times daily. (Patient not taking: Reported on 04/29/2023) 60 tablet 1   No current facility-administered medications for this visit.   Allergies:   Patient has no known allergies.   Social History:  The patient  reports that he has been smoking cigarettes. He started smoking about 46 years ago. He has a 46 pack-year smoking history. He has never used smokeless tobacco. He reports that he does not drink alcohol and does not use drugs.   Family History:   family history includes Diabetes in his father and paternal  grandmother.   Review of Systems: Review of Systems  Constitutional: Negative.   HENT: Negative.    Respiratory: Negative.    Cardiovascular: Negative.   Gastrointestinal: Negative.   Musculoskeletal: Negative.   Neurological: Negative.   Psychiatric/Behavioral: Negative.    All other systems reviewed and are negative.  PHYSICAL EXAM: VS:  BP 130/80 (BP Location: Right Arm, Patient Position: Sitting, Cuff Size: Normal)   Pulse (!) 120   Ht 6' (1.829 m)   Wt 170 lb 8 oz (77.3 kg)   SpO2 95%   BMI 23.12 kg/m  , BMI Body mass index is 23.12 kg/m. Constitutional:  oriented to person, place, and time. No distress.  HENT:  Head: Grossly normal Eyes:  no discharge. No scleral icterus.  Neck: No JVD, no carotid bruits  Cardiovascular: Regular rate and rhythm, no murmurs appreciated Pulmonary/Chest: Clear to auscultation bilaterally, no wheezes or rails Abdominal: Soft.  no distension.  no tenderness.  Musculoskeletal: Normal range of motion  Neurological:  normal muscle tone. Coordination normal. No atrophy Skin: Skin warm and dry Psychiatric: normal affect, pleasant  Recent Labs: 02/17/2023: TSH 5.286 02/19/2023: Magnesium 1.7 02/25/2023: ALT 9 03/07/2023: BUN 24; Creatinine, Ser 0.84; Hemoglobin 10.5; Platelets 328; Potassium 4.0; Sodium 139   Lipid Panel Lab Results  Component Value Date   CHOL 131 09/07/2006   HDL 48.3 09/07/2006   TRIG 246 (HH) 09/07/2006     Wt Readings from Last 3 Encounters:  04/29/23 170 lb 8 oz (77.3 kg)  03/25/23 166 lb (75.3 kg)  03/07/23 154 lb 8.7 oz (70.1 kg)     ASSESSMENT AND PLAN:  Problem List Items Addressed This Visit       Cardiology Problems   Atrial fibrillation with rapid ventricular response (HCC)   Relevant Medications   aspirin EC 325 MG tablet   Other Relevant Orders   EKG 12-Lead (Completed)   Acute on chronic diastolic CHF (congestive heart failure) (HCC) - Primary   Relevant Medications   aspirin EC 325 MG tablet    Other Relevant Orders   EKG 12-Lead (Completed)   Persistent atrial fibrillation Treated in the hospital with amiodarone, metoprolol converting to normal sinus rhythm discharged on metoprolol amiodarone and Eliquis He has since stopped all medications as he feels well, prescription ran out He does not want to refill the prescriptions, prefers to monitor heart rate at home for paroxysmal atrial fibrillation and will call us for any concern for arrhythmia -Currently denies any tachypalpitations concerning for atrial fibrillation  Cardiomyopathy Ejection fraction estimated 45 to 50% on echocardiogram in the hospital He is off metoprolol, off spironolactone Appears euvolemic Recommended repeat limited echo given 3 months since prior study  Smoker We have encouraged him to continue to work on weaning his cigarettes and smoking cessation. He will continue to work on this and does not want any assistance with chantix.    Empyema, s/p VATS Has follow-up with CT surgery Reports he has made full recovery Smoking cessation recommended  Signed, Dossie Arbour, M.D., Ph.D. [facility] [place], [place] [phone]

## 2023-04-29 NOTE — Patient Instructions (Addendum)
 Medication Instructions:  No changes  If you need a refill on your cardiac medications before your next appointment, please call your pharmacy.   Lab work: No new labs needed  Testing/Procedures: Your physician has requested that you have a limited echocardiogram. Echocardiography is a painless test that uses sound waves to create images of your heart. It provides your doctor with information about the size and shape of your heart and how well your heart's chambers and valves are working.   You may receive an ultrasound enhancing agent through an IV if needed to better visualize your heart during the echo. This procedure takes approximately one hour.  There are no restrictions for this procedure.  This will take place at [address] [facility] [address], [place] [address]  Please note: We ask at that you not bring children with you during ultrasound (echo/ vascular) testing. Due to room size and safety concerns, children are not allowed in the ultrasound rooms during exams. Our front office staff cannot provide observation of children in our lobby area while testing is being conducted. An adult accompanying a patient to their appointment will only be allowed in the ultrasound room at the discretion of the ultrasound technician under special circumstances. We apologize for any inconvenience.   Follow-Up: At [facility], you and your health needs are our priority.  As part of our continuing mission to provide you with exceptional heart care, we have created designated Provider Care Teams.  These Care Teams include your primary Cardiologist (physician) and Advanced Practice Providers (APPs -  Physician Assistants and Nurse Practitioners) who all work together to provide you with the care you need, when you need it.  You will need a follow up appointment in 6 months, APP ok  Providers on your designated Care Team:   Nicolasa Ducking, NP Eula Listen, PA-C Cadence Fransico Michael,  [place]  COVID-19 Vaccine Information can be found at: [email or web address] For questions related to vaccine distribution or appointments, please email [email or web address] or call [phone].

## 2023-05-09 ENCOUNTER — Ambulatory Visit
Admission: RE | Admit: 2023-05-09 | Discharge: 2023-05-09 | Disposition: A | Discharge status: Home / Self Care | Source: Ambulatory Visit | Attending: Thoracic Surgery (Cardiothoracic Vascular Surgery) | Admitting: Thoracic Surgery (Cardiothoracic Vascular Surgery)

## 2023-05-09 ENCOUNTER — Ambulatory Visit (INDEPENDENT_AMBULATORY_CARE_PROVIDER_SITE_OTHER): Payer: Self-pay | Admitting: Thoracic Surgery (Cardiothoracic Vascular Surgery)

## 2023-05-09 VITALS — BP 135/78 | HR 96 | Resp 20 | Ht 72.0 in | Wt 170.0 lb

## 2023-05-09 DIAGNOSIS — J869 Pyothorax without fistula: Secondary | ICD-10-CM

## 2023-05-09 DIAGNOSIS — Z09 Encounter for follow-up examination after completed treatment for conditions other than malignant neoplasm: Secondary | ICD-10-CM

## 2023-05-09 NOTE — Progress Notes (Signed)
      [address]       [place] [address]             [phone]        Worth Kober [facility] Medical Record #[id number] Date of Birth: 09/03/1960  Referring: Deforest Fast, MD Primary Care: Patient, No Pcp Per Primary Cardiologist:None  Reason for visit:   follow-up  History of Present Illness:     63 year old male presents for his 1 month follow-up appointment.  He denies any shortness of breath.  He does continue to smoke.  Physical Exam: BP 135/78   Pulse 96   Resp 20   Ht 6' (1.829 m)   Wt 170 lb (77.1 kg)   SpO2 95% Comment: RA  BMI 23.06 kg/m   Alert NAD Incision clean.   Abdomen, ND No peripheral edema   Diagnostic Studies & Laboratory data: CXR: Improved left effusion.     Assessment / Plan:   63 year old male status post VATS decortication for empyema.  Overall he is doing well.  He will follow-up as needed.   Hilarie Lovely 05/09/2023 3:26 PM

## 2023-05-25 ENCOUNTER — Ambulatory Visit

## 2023-07-06 ENCOUNTER — Ambulatory Visit

## 2023-07-19 ENCOUNTER — Ambulatory Visit: Attending: Cardiovascular Disease

## 2023-07-29 IMAGING — CT CT RENAL STONE PROTOCOL
2 of 4 series · 16 of 46 positions shown, 18 images · non-contrast
Comparison: None Available.

CLINICAL DATA: Right back pain extending into the right leg x9
days.



[Series 2: stone full standard · axial · 0.77mm/px · z∈[-984,-514]mm · 13 of 104 slices shown, 15 images]
[im 5/104  soft-tissue]
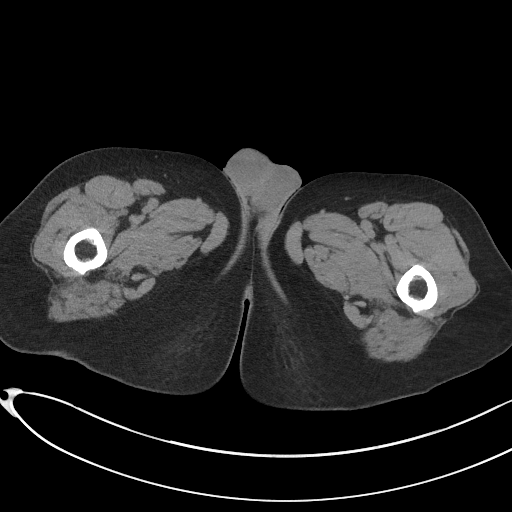
[im 5/104  bone]
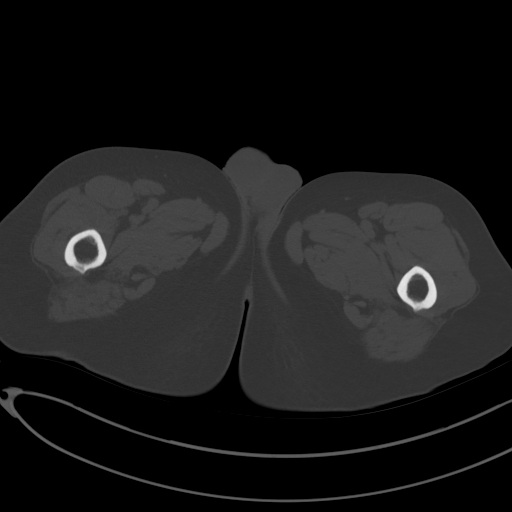
[im 13/104  soft-tissue]
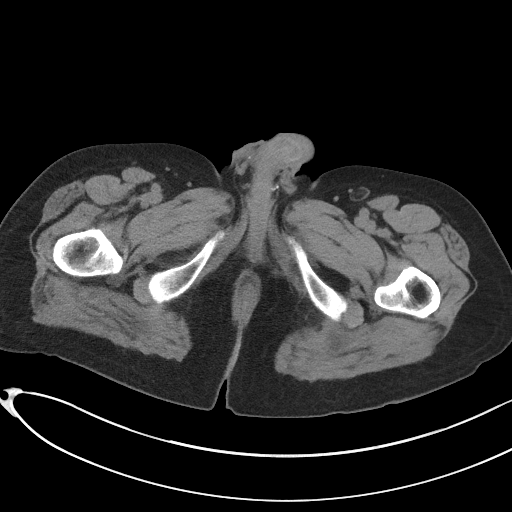
[im 22/104  soft-tissue]
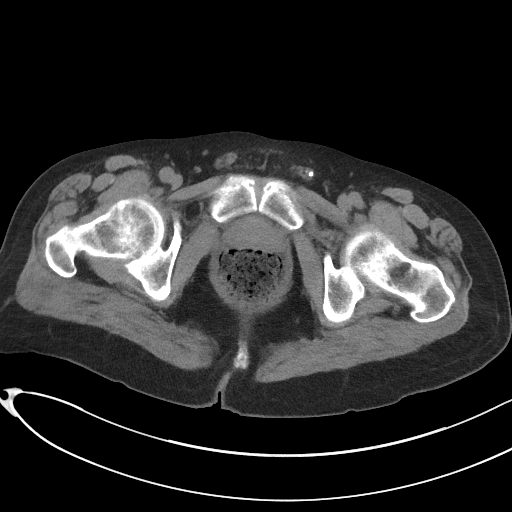
[im 31/104  soft-tissue]
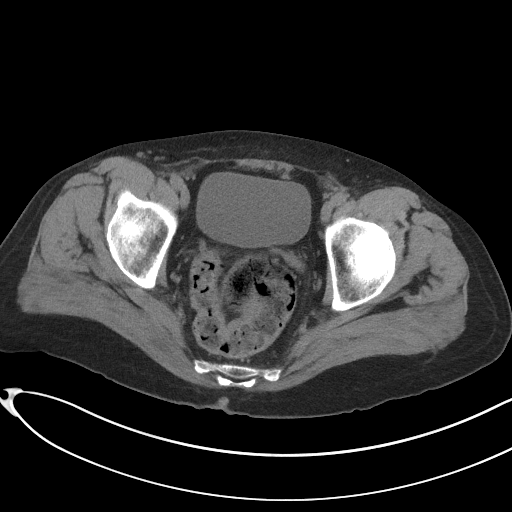
[im 35/104  soft-tissue]
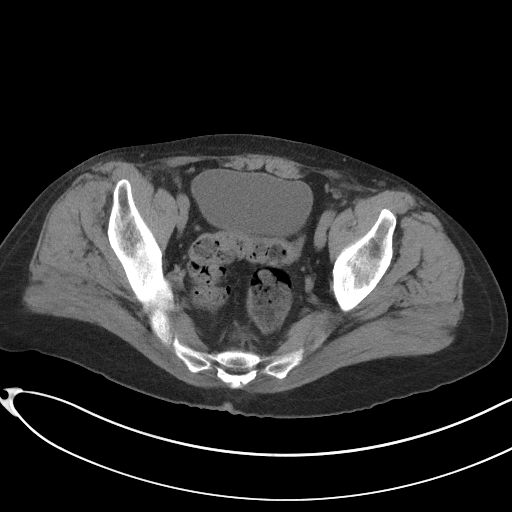
[im 43/104  soft-tissue]
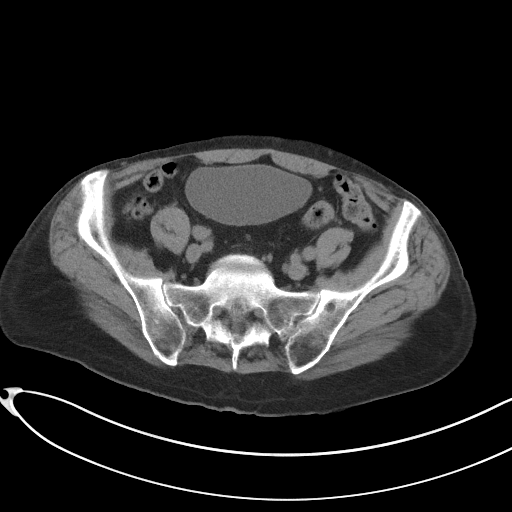
[im 52/104  soft-tissue]
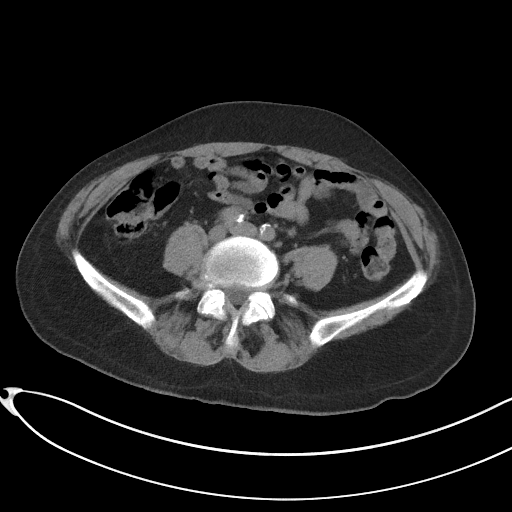
[im 61/104  soft-tissue]
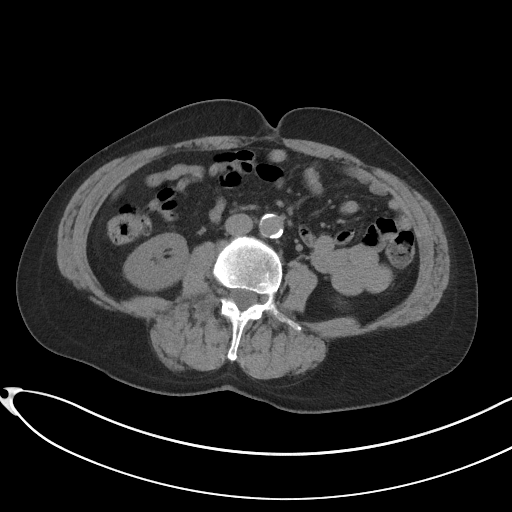
[im 69/104  soft-tissue]
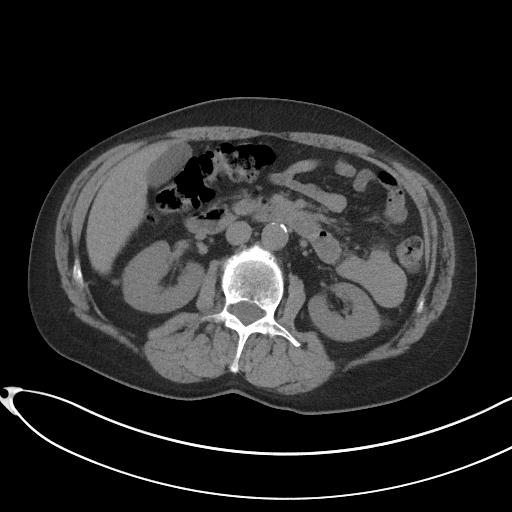
[im 69/104  bone]
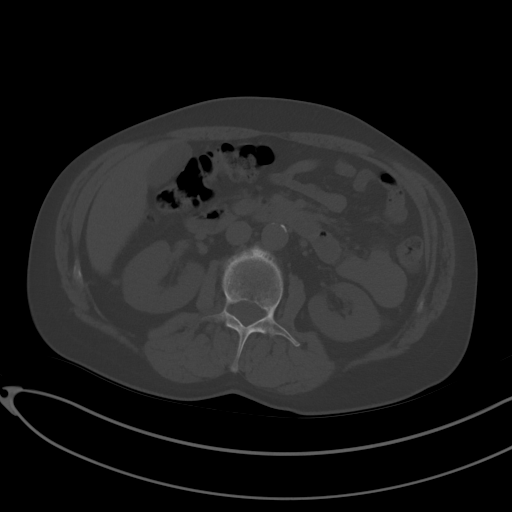
[im 73/104  soft-tissue]
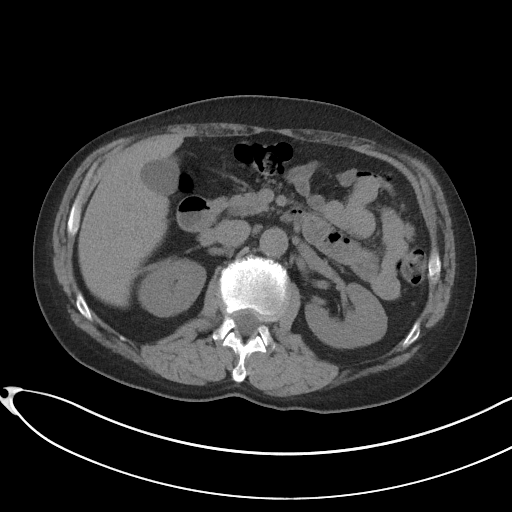
[im 82/104  soft-tissue]
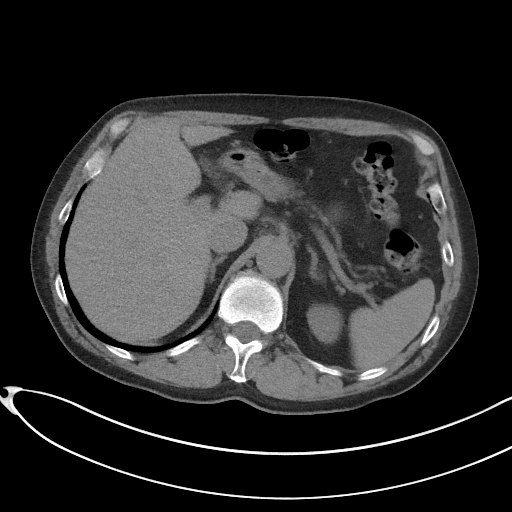
[im 91/104  soft-tissue]
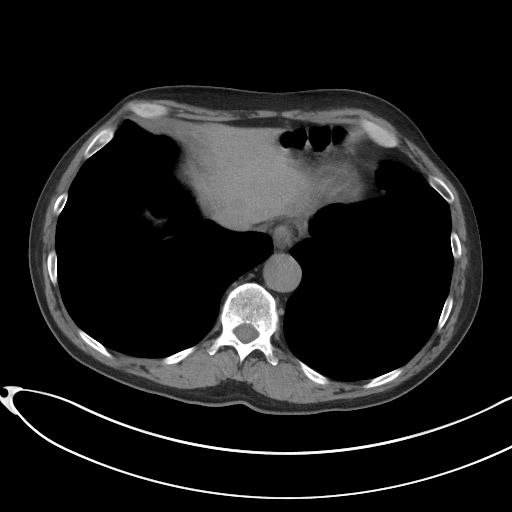
[im 99/104  soft-tissue]
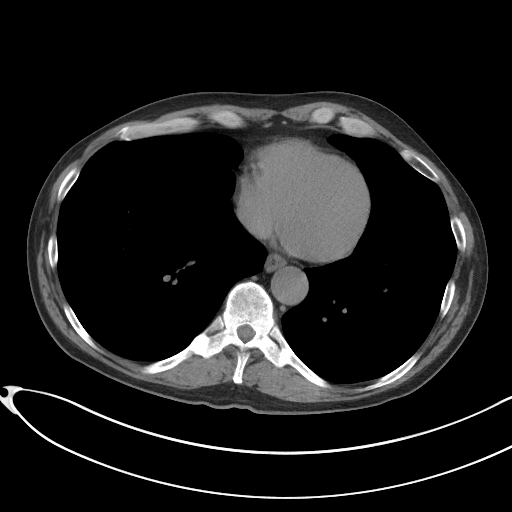

[Series 5: coronal · coronal · 0.83mm/px · 3 of 131 slices shown]
[im 44/131  soft-tissue]
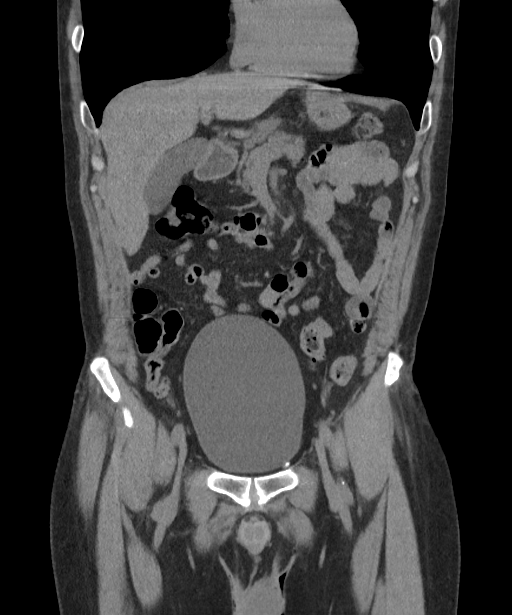
[im 58/131  soft-tissue]
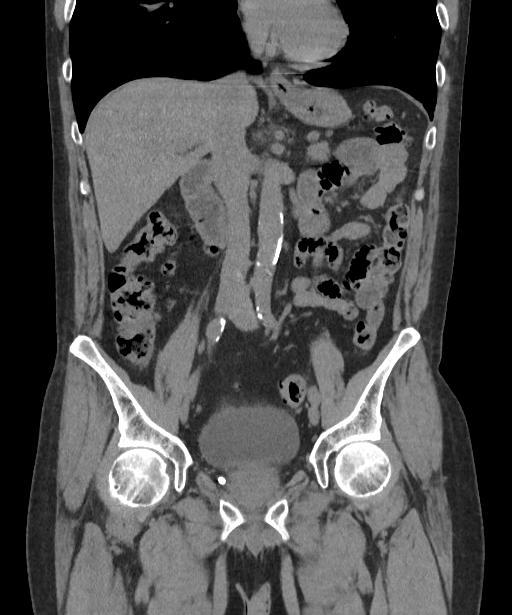
[im 73/131  soft-tissue]
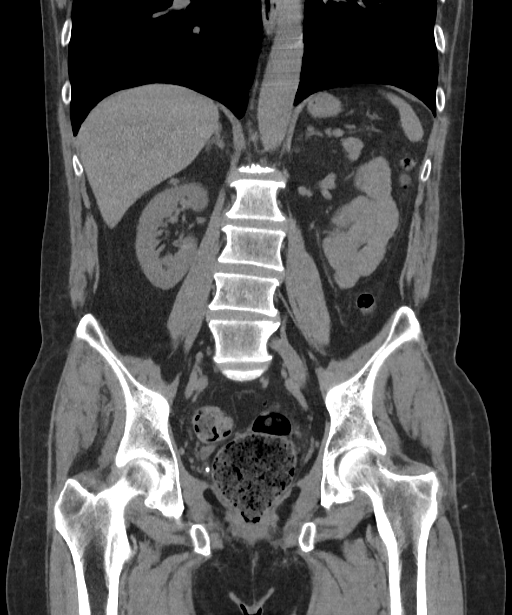

[16 of 46 positions shown; findings below may reference images not displayed]

FINDINGS: Lower chest: No acute abnormality.

Hepatobiliary: Unremarkable noncontrast appearance of the hepatic
parenchyma. Gallbladder is unremarkable. No biliary ductal dilation.

Pancreas: No pancreatic ductal dilation or evidence of acute
inflammation.

Spleen: Normal in size without focal abnormality.

Adrenals/Urinary Tract: Hypodense 1 cm left adrenal nodule on image
[DATE] measures Hounsfield units of 0 consistent with a benign adrenal
adenoma which requires no follow-up. Right adrenal gland appears
normal.

No hydronephrosis. No renal, ureteral or bladder calculi identified.
Multiple pelvic phleboliths. Partially exophytic hypodense 11 mm
fluid density left interpolar renal lesion is consistent with a
benign renal cyst requiring no independent follow-up. Urinary
bladder is unremarkable for degree of distension.

Stomach/Bowel: No radiopaque enteric contrast material was
administered. Stomach is minimally distended limiting evaluation of
the wall. No pathologic dilation of small or large bowel. The
appendix and terminal ileum appear. No evidence of acute bowel
inflammation. Moderate rectal stool burden with mild wall thickening
and stranding of the perirectal fat.

Vascular/Lymphatic: Aortic and branch vessel atherosclerosis without
abdominal aortic aneurysm. No pathologically enlarged abdominal or
pelvic lymph nodes.

Reproductive: Prostate is unremarkable.

Other: No significant abdominopelvic free fluid.

Musculoskeletal: Mild thoracolumbar spondylosis. No acute osseous
abnormality.
IMPRESSION: 1. No acute intra-abdominal or pelvic findings.
2. Moderate rectal stool burden with mild wall thickening and
stranding of the perirectal fat may reflect early findings of
stercoral colitis.
3. Benign 1 cm left adrenal adenoma requires no follow-up.
4.  Aortic Atherosclerosis ([id number]).

## 2023-12-29 ENCOUNTER — Other Ambulatory Visit: Payer: Self-pay | Admitting: Cardiovascular Disease

## 2023-12-29 DIAGNOSIS — I4891 Unspecified atrial fibrillation: Secondary | ICD-10-CM

## 2023-12-29 DIAGNOSIS — I5033 Acute on chronic diastolic (congestive) heart failure: Secondary | ICD-10-CM

## 2024-01-02 ENCOUNTER — Ambulatory Visit: Attending: Cardiovascular Disease

## 2024-01-10 ENCOUNTER — Ambulatory Visit: Attending: Medical | Admitting: Medical

## 2024-01-10 NOTE — Progress Notes (Deleted)
" °  Cardiology Office Note   Date:  01/10/2024  ID:  Mathew Wyatt, DOB 02-12-1960, MRN [id number] PCP: Patient, No Pcp Per  [facility] Providers Cardiologist:  None { Click to update primary MD,subspecialty MD or APP then REFRESH:1}    History of Present Illness Mathew Wyatt is a 63 y.o. male with a history of atrial fibrillation, COPD, tobacco abuse, chronic diastolic heart failure who presents for follow-up of A-fib.  The patient was admitted 02/07/2023 with shortness of breath and cough started on IV antibiotic.  CT of the chest showed empyema versus abscess and he was transferred to [facility] where he went into A-fib RVR and had subsequent hypotension.  Patient was started on IV amiodarone .  He underwent chest tube placement.  Echocardiogram was negative for vegetation, EF 45 to 50%.  Patient was treated with beta-blocker.  Patient was seen by Dr. Shyrl, he refused VATS.  He changes mind and then agreed to surgery.  Patient was started on IV heparin  but hemoglobin went down to 6.  Heparin  was held and he was transfused.  He underwent VATS and decortication of left empyema.  IV heparin  was changed to a DOAC.  Chest tube was eventually removed and he was discharged on Eliquis .  ROS: ***  Studies Reviewed      *** Risk Assessment/Calculations {Does this patient have ATRIAL FIBRILLATION?:[phone]} No BP recorded.  {Refresh Note OR Click here to enter BP  :1}***       Physical Exam VS:  There were no vitals taken for this visit.       Wt Readings from Last 3 Encounters:  05/09/23 170 lb (77.1 kg)  04/29/23 170 lb 8 oz (77.3 kg)  03/25/23 166 lb (75.3 kg)    GEN: Well nourished, well developed in no acute distress NECK: No JVD; No carotid bruits CARDIAC: ***RRR, no murmurs, rubs, gallops RESPIRATORY:  Clear to auscultation without rales, wheezing or rhonchi  ABDOMEN: Soft, non-tender, non-distended EXTREMITIES:  No edema; No deformity   ASSESSMENT AND PLAN ***     {Are you ordering a CV Procedure (e.g. stress test, cath, DCCV, TEE, etc)?   Press F2        :[id number]}  Dispo: ***  Signed, Avonlea Sima VEAR Fishman, PA-C   "
